# Patient Record
Sex: Female | Born: 1950 | Race: Black or African American | Hispanic: No | Smoking: Never smoker
Health system: Southern US, Community
[De-identification: ages and names within clinical notes are randomized; demographics above are authoritative.]

## PROBLEM LIST (undated history)

## (undated) DIAGNOSIS — Z923 Personal history of irradiation: Secondary | ICD-10-CM

## (undated) DIAGNOSIS — D4959 Neoplasm of unspecified behavior of other genitourinary organ: Secondary | ICD-10-CM

## (undated) DIAGNOSIS — Z8049 Family history of malignant neoplasm of other genital organs: Secondary | ICD-10-CM

## (undated) DIAGNOSIS — N393 Stress incontinence (female) (male): Secondary | ICD-10-CM

## (undated) DIAGNOSIS — Z800 Family history of malignant neoplasm of digestive organs: Secondary | ICD-10-CM

## (undated) DIAGNOSIS — H44009 Unspecified purulent endophthalmitis, unspecified eye: Secondary | ICD-10-CM

## (undated) DIAGNOSIS — G4733 Obstructive sleep apnea (adult) (pediatric): Secondary | ICD-10-CM

## (undated) DIAGNOSIS — M1990 Unspecified osteoarthritis, unspecified site: Secondary | ICD-10-CM

## (undated) DIAGNOSIS — M8580 Other specified disorders of bone density and structure, unspecified site: Secondary | ICD-10-CM

## (undated) DIAGNOSIS — C189 Malignant neoplasm of colon, unspecified: Secondary | ICD-10-CM

## (undated) DIAGNOSIS — Z9221 Personal history of antineoplastic chemotherapy: Secondary | ICD-10-CM

## (undated) DIAGNOSIS — O2230 Deep phlebothrombosis in pregnancy, unspecified trimester: Secondary | ICD-10-CM

## (undated) DIAGNOSIS — H16009 Unspecified corneal ulcer, unspecified eye: Secondary | ICD-10-CM

## (undated) HISTORY — DX: Malignant neoplasm of colon, unspecified: C18.9

## (undated) HISTORY — PX: COLON SURGERY: SHX602

## (undated) HISTORY — DX: Deep phlebothrombosis in pregnancy, unspecified trimester: O22.30

## (undated) HISTORY — DX: Obstructive sleep apnea (adult) (pediatric): G47.33

## (undated) HISTORY — PX: ABDOMINAL HYSTERECTOMY: SHX81

## (undated) HISTORY — DX: Personal history of antineoplastic chemotherapy: Z92.21

## (undated) HISTORY — DX: Other specified disorders of bone density and structure, unspecified site: M85.80

## (undated) HISTORY — DX: Stress incontinence (female) (male): N39.3

## (undated) HISTORY — DX: Family history of malignant neoplasm of other genital organs: Z80.49

## (undated) HISTORY — DX: Morbid (severe) obesity due to excess calories: E66.01

## (undated) HISTORY — DX: Family history of malignant neoplasm of digestive organs: Z80.0

## (undated) HISTORY — DX: Personal history of irradiation: Z92.3

## (undated) HISTORY — DX: Unspecified osteoarthritis, unspecified site: M19.90

---

## 1976-08-02 HISTORY — PX: BREAST CYST EXCISION: SHX579

## 1977-08-02 DIAGNOSIS — O2230 Deep phlebothrombosis in pregnancy, unspecified trimester: Secondary | ICD-10-CM

## 1977-08-02 HISTORY — DX: Deep phlebothrombosis in pregnancy, unspecified trimester: O22.30

## 1980-08-02 HISTORY — PX: KELOID EXCISION: SHX1856

## 2000-08-02 HISTORY — PX: TOTAL HIP ARTHROPLASTY: SHX124

## 2000-08-02 HISTORY — PX: JOINT REPLACEMENT: SHX530

## 2009-08-02 HISTORY — PX: MASS EXCISION: SHX2000

## 2009-11-16 ENCOUNTER — Ambulatory Visit: Payer: Self-pay | Admitting: Diagnostic Radiology

## 2009-11-16 ENCOUNTER — Emergency Department (HOSPITAL_BASED_OUTPATIENT_CLINIC_OR_DEPARTMENT_OTHER): Admission: EM | Admit: 2009-11-16 | Discharge: 2009-11-16 | Payer: Self-pay | Admitting: Emergency Medicine

## 2009-11-18 ENCOUNTER — Emergency Department (HOSPITAL_BASED_OUTPATIENT_CLINIC_OR_DEPARTMENT_OTHER): Admission: EM | Admit: 2009-11-18 | Discharge: 2009-11-18 | Payer: Self-pay | Admitting: Emergency Medicine

## 2009-11-24 ENCOUNTER — Emergency Department (HOSPITAL_BASED_OUTPATIENT_CLINIC_OR_DEPARTMENT_OTHER): Admission: EM | Admit: 2009-11-24 | Discharge: 2009-11-24 | Payer: Self-pay | Admitting: Emergency Medicine

## 2009-11-30 HISTORY — PX: TOTAL ABDOMINAL HYSTERECTOMY W/ BILATERAL SALPINGOOPHORECTOMY: SHX83

## 2009-12-03 ENCOUNTER — Ambulatory Visit: Payer: Self-pay | Admitting: Obstetrics and Gynecology

## 2009-12-03 ENCOUNTER — Other Ambulatory Visit: Admission: RE | Admit: 2009-12-03 | Discharge: 2009-12-03 | Payer: Self-pay | Admitting: Obstetrics and Gynecology

## 2009-12-04 ENCOUNTER — Encounter: Payer: Self-pay | Admitting: Obstetrics and Gynecology

## 2009-12-04 ENCOUNTER — Ambulatory Visit: Admission: RE | Admit: 2009-12-04 | Discharge: 2009-12-04 | Payer: Self-pay | Admitting: Gynecologic Oncology

## 2009-12-04 LAB — CONVERTED CEMR LAB: CA 125: 275.8 units/mL — ABNORMAL HIGH (ref 0.0–30.2)

## 2009-12-09 ENCOUNTER — Inpatient Hospital Stay (HOSPITAL_COMMUNITY): Admission: RE | Admit: 2009-12-09 | Discharge: 2009-12-12 | Payer: Self-pay | Admitting: Obstetrics & Gynecology

## 2009-12-09 ENCOUNTER — Encounter: Payer: Self-pay | Admitting: Obstetrics & Gynecology

## 2009-12-17 ENCOUNTER — Ambulatory Visit: Admission: RE | Admit: 2009-12-17 | Discharge: 2009-12-17 | Payer: Self-pay | Admitting: Gynecologic Oncology

## 2009-12-27 DIAGNOSIS — D4959 Neoplasm of unspecified behavior of other genitourinary organ: Secondary | ICD-10-CM

## 2009-12-27 HISTORY — DX: Neoplasm of unspecified behavior of other genitourinary organ: D49.59

## 2010-01-29 ENCOUNTER — Ambulatory Visit: Admission: RE | Admit: 2010-01-29 | Discharge: 2010-01-29 | Payer: Self-pay | Admitting: Gynecologic Oncology

## 2010-10-20 LAB — DIFFERENTIAL
Basophils Absolute: 0 10*3/uL (ref 0.0–0.1)
Eosinophils Relative: 2 % (ref 0–5)
Lymphs Abs: 2.1 10*3/uL (ref 0.7–4.0)
Monocytes Absolute: 0.5 10*3/uL (ref 0.1–1.0)
Monocytes Relative: 8 % (ref 3–12)
Neutro Abs: 3.5 10*3/uL (ref 1.7–7.7)
Neutrophils Relative %: 56 % (ref 43–77)

## 2010-10-20 LAB — CBC
HCT: 36.1 % (ref 36.0–46.0)
HCT: 44 % (ref 36.0–46.0)
Hemoglobin: 14.3 g/dL (ref 12.0–15.0)
MCHC: 32.4 g/dL (ref 30.0–36.0)
MCV: 91.4 fL (ref 78.0–100.0)
Platelets: 199 10*3/uL (ref 150–400)
Platelets: 233 10*3/uL (ref 150–400)
RBC: 3.97 MIL/uL (ref 3.87–5.11)
RBC: 4.81 MIL/uL (ref 3.87–5.11)
RDW: 14.5 % (ref 11.5–15.5)
RDW: 14.7 % (ref 11.5–15.5)
WBC: 6.3 10*3/uL (ref 4.0–10.5)

## 2010-10-20 LAB — COMPREHENSIVE METABOLIC PANEL
GFR calc Af Amer: >60 mL/min (ref >60)
Potassium: 4.9 mEq/L (ref 3.5–5.1)
Total Protein: 7.3 g/dL (ref 6.0–8.3)

## 2010-10-20 LAB — TYPE AND SCREEN: Antibody Screen: NEGATIVE

## 2010-10-20 LAB — BASIC METABOLIC PANEL
BUN: 7 mg/dL (ref 6–23)
CO2: 29 mEq/L (ref 19–32)
Calcium: 7.9 mg/dL — ABNORMAL LOW (ref 8.4–10.5)
Calcium: 8.1 mg/dL — ABNORMAL LOW (ref 8.4–10.5)
Chloride: 110 mEq/L (ref 96–112)
Creatinine, Ser: 1.03 mg/dL (ref 0.4–1.2)
GFR calc Af Amer: >60 mL/min (ref >60)
GFR calc non Af Amer: 50 mL/min — ABNORMAL LOW (ref >60)
Potassium: 3.9 mEq/L (ref 3.5–5.1)
Potassium: 4.1 mEq/L (ref 3.5–5.1)
Sodium: 139 mEq/L (ref 135–145)
Sodium: 139 mEq/L (ref 135–145)

## 2010-10-20 LAB — ABO/RH: ABO/RH(D): A POS

## 2010-10-20 LAB — URINALYSIS, MICROSCOPIC ONLY
Glucose, UA: NEGATIVE mg/dL
Nitrite: NEGATIVE
pH: 5.5 (ref 5.0–8.0)

## 2010-10-20 LAB — HEMOGLOBIN AND HEMATOCRIT, BLOOD
HCT: 37.4 % (ref 36.0–46.0)
Hemoglobin: 12 g/dL (ref 12.0–15.0)

## 2011-01-28 ENCOUNTER — Ambulatory Visit: Attending: Gynecologic Oncology | Admitting: Gynecologic Oncology

## 2011-01-28 DIAGNOSIS — D4959 Neoplasm of unspecified behavior of other genitourinary organ: Secondary | ICD-10-CM | POA: Insufficient documentation

## 2011-01-29 NOTE — Consult Note (Signed)
NAMEMELISS, FLEEK NO.:  [id number]  MEDICAL RECORD NO.:  [id number]  LOCATION:  GYN                          FACILITY:  [facility]  PHYSICIAN:  Laurette Schimke, MD     DATE OF BIRTH:  [birth year]  DATE OF CONSULTATION:  01/28/2011 DATE OF DISCHARGE:                                CONSULTATION   REASON FOR VISIT:  Surveillance for low malignant potential tumor.  HISTORY OF PRESENT ILLNESS:  This is a 60 year old, last normal menstrual in 1998, who fell off a ladder and sustained a laceration to the right lower quadrant.  Emergency room evaluation included a CT scan of the abdomen which noted the presence of 12.6 x 9.2 x 11.3 cm mass. She underwent a total abdominal hysterectomy, bilateral salpingo- oophorectomy and intragastric omentectomy on Dec 09, 2009.  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  Jasmine Clayton has done well since that procedure.  PAST MEDICAL HISTORY:  Obesity for 40 years, low malignant potential tumor diagnosed in May 2011.  PAST SURGICAL HISTORY:  Abdominoplasty 40 years ago.  Cesarean section 40 years ago.  Left hip replacement in 2002.  Total abdominal hysterectomy, bilateral salpingo-oophorectomy and infragastric omentectomy in May 2011.  FAMILY HISTORY:  No interval changes.  SOCIAL HISTORY:  She has a new grandchild.  There is a lot of stress in her family.  REVIEW OF SYSTEMS:  Ten-point review of systems noteworthy for weight gain.  No nausea, vomiting, abdominal pain.  No fevers or chills.  No bleeding from the rectum, bladder or vagina.  PHYSICAL EXAMINATION:  GENERAL:  Well-developed, obese female; in no acute distress. VITAL SIGNS:  Weight 254 pounds, height 5 feet, blood pressure 140/88, pulse 90, temperature 98.4 ABDOMEN:  Soft, nontender, keloid formation noted in midline vertical skin incision at  the site of laceration in the right lower quadrant, nontender. PELVIC EXAMINATION:  Normal external genitalia, Bartholin's, urethral and Skene's.  Atrophic vagina.  No masses.  No discharge, no bleeding. No nodularity in the cul-de-sac. RECTAL EXAMINATION:  Good anal sphincter without any masses. EXTREMITIES:  A 1 to 2+ lower extremity edema bilaterally.  PROBLEMS: 1. Stage IA low malignant potential tumor.  The importance of followup     was stressed with Jasmine Clayton.  She has been advised to follow up     with Dr. Macon Large in 6 months and GYN Oncology in 12 months. 2. Morbid obesity.  Ms., Jasmine Clayton is well aware of the 22 pounds     weight gain since her last visit.  She understands the importance     of weight loss in her overall health.  I have also impressed upon     her that as woman of her age, general practitioner is necessary for      screening, for diabetes, coronary artery disease etc. She states     that she will follow up and she understands the need for mammography      and colonoscopy. She is given the names of family doctors in her area     and selects to go to [place] [facility].     Laurette Schimke, MD     WB/MEDQ  D:  01/28/2011  T:  01/28/2011  Job:  [id number]  cc:   Horton Chin, MD  Telford Nab, R.N. [address] [place], [place] [address]  [facility]  Electronically Signed by Laurette Schimke MD on 01/29/2011 47:82:95 AM

## 2011-02-10 ENCOUNTER — Ambulatory Visit (INDEPENDENT_AMBULATORY_CARE_PROVIDER_SITE_OTHER): Admitting: Family

## 2011-02-10 ENCOUNTER — Ambulatory Visit (HOSPITAL_BASED_OUTPATIENT_CLINIC_OR_DEPARTMENT_OTHER)
Admission: RE | Admit: 2011-02-10 | Discharge: 2011-02-10 | Disposition: A | Discharge status: Home / Self Care | Source: Ambulatory Visit | Attending: Family | Admitting: Family

## 2011-02-10 ENCOUNTER — Encounter: Payer: Self-pay | Admitting: Family

## 2011-02-10 DIAGNOSIS — R195 Other fecal abnormalities: Secondary | ICD-10-CM

## 2011-02-10 DIAGNOSIS — N838 Other noninflammatory disorders of ovary, fallopian tube and broad ligament: Secondary | ICD-10-CM

## 2011-02-10 DIAGNOSIS — R05 Cough: Secondary | ICD-10-CM

## 2011-02-10 DIAGNOSIS — R030 Elevated blood-pressure reading, without diagnosis of hypertension: Secondary | ICD-10-CM

## 2011-02-10 DIAGNOSIS — N839 Noninflammatory disorder of ovary, fallopian tube and broad ligament, unspecified: Secondary | ICD-10-CM

## 2011-02-10 DIAGNOSIS — I10 Essential (primary) hypertension: Secondary | ICD-10-CM | POA: Insufficient documentation

## 2011-02-10 DIAGNOSIS — K625 Hemorrhage of anus and rectum: Secondary | ICD-10-CM

## 2011-02-10 DIAGNOSIS — R059 Cough, unspecified: Secondary | ICD-10-CM | POA: Insufficient documentation

## 2011-02-10 NOTE — Assessment & Plan Note (Signed)
Pt with cough x 1 month.  + associated gerd symptoms.  Will add empiric PPI and send for CXR to rule out pneumonia.

## 2011-02-10 NOTE — Assessment & Plan Note (Signed)
Patient counseled on diet, exercise and weight loss. 

## 2011-02-10 NOTE — Patient Instructions (Signed)
Please complete your chest x-ray on the first floor today. You will be contacted about your referral to Gastroenterology to evaluate your rectal bleeding. Complete your blood work on the first floor.  Follow up in 1 month for a physical, come fasting to this appointment.

## 2011-02-10 NOTE — Assessment & Plan Note (Signed)
S/p resection- Stage IA low malignant potential tumor. This is being followed by GYN and GYN oncology.

## 2011-02-10 NOTE — Assessment & Plan Note (Signed)
Discussed low sodium diet. Will repeat bp in 1 month.

## 2011-02-10 NOTE — Assessment & Plan Note (Signed)
Check CBC and refer to GI for further evaluation.

## 2011-02-10 NOTE — Progress Notes (Signed)
  Subjective:    Patient ID: Jasmine Clayton, female    DOB: March 08, 1951, 60 y.o.   MRN: [id number]  HPI  1. Cough- symptoms started 1 month ago.  Reports that she has had this intermittently in the past.  Symptoms start as a "tickle" in her throat, then brings up mucous.  Even up in the middle of the night.  Phlegm is clear.  Denies associated fever or nasal drainage.  She reports that she has tried cough drops without improvement. Denies hx of asthma or smoking.  Does report + associated GERD symptoms.   2.  Rectal bleeding-  Reports that occasionally she sees blood in the stools.  First noticed about 6 months ago.  She has never had a colonoscopy.  She denies known history of hemorrhoids.  Denies constipation.    3.  Reports that sh had a history of DVT back in 1979 when she was pregnant- was treated with coumadin.    4.  DJD- had hip replacement 2002- This was completed at [facility]- Dr. Tenny Craw.   5.  Ovarian Mass- reports that she came through the emergency room- following a fall.  She had a CT scan which revealed ovarian cancer. Was removed by Dr. Nelly Rout- completed at [facility].   Review of Systems  Constitutional:       + weight gain, notes that she is not as active as she used to be.  Eats one meal a day only + snacks.    HENT: Negative for hearing loss and congestion.   Eyes: Negative for visual disturbance.  Respiratory: Positive for cough.        + DOE- started recently since she gained 24 pounds  Cardiovascular: Negative for chest pain and palpitations.       "a little" swelling in feet.  Gastrointestinal: Positive for blood in stool. Negative for nausea and diarrhea.       Vomits occasionally with cough  Genitourinary: Negative for hematuria.       + stress incontinence with coughing.  Musculoskeletal: Positive for back pain and arthralgias.  Neurological: Negative for headaches.  Psychiatric/Behavioral:       Denies depression or anxiety.       Objective:   Physical  Exam  Constitutional: She is oriented to person, place, and time. She appears well-developed and well-nourished.       Morbidly obese AA female.  HENT:  Head: Normocephalic and atraumatic.  Eyes: Conjunctivae are normal.  Cardiovascular: Normal rate and regular rhythm.   Pulmonary/Chest: Effort normal and breath sounds normal.  Abdominal: Soft. Bowel sounds are normal.       Rectal exam- normal, no visible external hemorrhoids or palpable internal hemorrhoids.  Heme + stool in rectal vault today.   Neurological: She is alert and oriented to person, place, and time. Coordination normal.  Skin: Skin is warm and dry.  Psychiatric: She has a normal mood and affect. Her behavior is normal. Judgment and thought content normal.          Assessment & Plan:  45 minutes spent with the patient today>50% of this time was spent counseling patient on her morbid obesity, diet, exercise, and importance of medical f/u.

## 2011-02-11 ENCOUNTER — Encounter: Payer: Self-pay | Admitting: Gastroenterology

## 2011-02-11 ENCOUNTER — Telehealth: Payer: Self-pay | Admitting: Family

## 2011-02-11 LAB — CBC WITH DIFFERENTIAL/PLATELET
Basophils Relative: 0 % (ref 0–1)
HCT: 42.1 % (ref 36.0–46.0)
Hemoglobin: 13.1 g/dL (ref 12.0–15.0)
Lymphocytes Relative: 41 % (ref 12–46)
Neutro Abs: 3.3 10*3/uL (ref 1.7–7.7)
Neutrophils Relative %: 48 % (ref 43–77)
Platelets: 278 10*3/uL (ref 150–400)
RBC: 4.72 MIL/uL (ref 3.87–5.11)
RDW: 15.6 % — ABNORMAL HIGH (ref 11.5–15.5)

## 2011-02-11 LAB — BASIC METABOLIC PANEL
BUN: 15 mg/dL (ref 6–23)
Calcium: 9.7 mg/dL (ref 8.4–10.5)
Glucose, Bld: 84 mg/dL (ref 70–99)
Potassium: 4.3 mEq/L (ref 3.5–5.3)

## 2011-02-11 MED ORDER — AZITHROMYCIN 250 MG PO TABS: ORAL_TABLET | ORAL | Status: AC

## 2011-02-11 NOTE — Telephone Encounter (Signed)
CXR shows possible bronchitis.  Will plan to treat with z-pack.

## 2011-02-17 NOTE — Telephone Encounter (Signed)
Called pt, she reports that her cough is intermittent.  Somewhat improved. She started z-pak today.  Instructed pt to complete zpak and contact me if her symptoms do not improve.

## 2011-03-15 ENCOUNTER — Encounter: Payer: Self-pay | Admitting: Family

## 2011-03-15 ENCOUNTER — Ambulatory Visit (INDEPENDENT_AMBULATORY_CARE_PROVIDER_SITE_OTHER): Admitting: Family

## 2011-03-15 DIAGNOSIS — Z23 Encounter for immunization: Secondary | ICD-10-CM

## 2011-03-15 DIAGNOSIS — Z0000 Encounter for general adult medical examination without abnormal findings: Secondary | ICD-10-CM

## 2011-03-15 LAB — BASIC METABOLIC PANEL WITH GFR
BUN: 16 mg/dL (ref 6–23)
Creat: 0.98 mg/dL (ref 0.50–1.10)
GFR, Est Non African American: 58 mL/min — ABNORMAL LOW (ref >60)
Glucose, Bld: 91 mg/dL (ref 70–99)
Sodium: 141 mEq/L (ref 135–145)

## 2011-03-15 LAB — CBC WITH DIFFERENTIAL/PLATELET
Basophils Absolute: 0 10*3/uL (ref 0.0–0.1)
Eosinophils Absolute: 0.2 10*3/uL (ref 0.0–0.7)
HCT: 40 % (ref 36.0–46.0)
Hemoglobin: 13 g/dL (ref 12.0–15.0)
MCH: 28 pg (ref 26.0–34.0)
MCV: 86 fL (ref 78.0–100.0)
Monocytes Relative: 11 % (ref 3–12)
Platelets: 254 10*3/uL (ref 150–400)
WBC: 6.2 10*3/uL (ref 4.0–10.5)

## 2011-03-15 LAB — HEPATIC FUNCTION PANEL
AST: 20 U/L (ref 0–37)
Albumin: 4.2 g/dL (ref 3.5–5.2)
Alkaline Phosphatase: 101 U/L (ref 39–117)
Total Protein: 6.7 g/dL (ref 6.0–8.3)

## 2011-03-15 LAB — LIPID PANEL
HDL: 50 mg/dL (ref >39)
Triglycerides: 82 mg/dL (ref <150)

## 2011-03-15 MED ORDER — TETANUS-DIPHTH-ACELL PERTUSSIS 5-2.5-18.5 LF-MCG/0.5 IM SUSP: 0.5000 mL | Freq: Once | INTRAMUSCULAR | Status: DC

## 2011-03-15 NOTE — Assessment & Plan Note (Addendum)
Pt counseled on diet, exercise, weight loss.  Due for colo.  She has consultation with GI scheduled next week due to hx or rectal bleeding.   EKG today.  Fasting lab work, mammogram ordered today.

## 2011-03-15 NOTE — Progress Notes (Signed)
Subjective:    Patient ID: Jasmine Clayton, female    DOB: 1951/07/10, 60 y.o.   MRN: [id number]  HPI  Preventative care-  Not exercising.  Diet- coffee in the AM.  Cooks on Sundays- fast food for dinner.   Occasional snacking.  GYN oncology- She is followed by [facility].  Last mammogram- 6 years ago.  Last tetanus sot >10 yrs ago.      Review of Systems  Constitutional: Negative for fever and unexpected weight change.  HENT: Negative for hearing loss.   Eyes: Negative for visual disturbance.  Respiratory: Negative for shortness of breath.   Cardiovascular: Negative for chest pain.  Gastrointestinal: Positive for blood in stool. Negative for nausea, vomiting, abdominal pain, diarrhea and constipation.  Genitourinary: Negative for dysuria, urgency, frequency and vaginal bleeding.  Musculoskeletal: Negative for myalgias.       Some right knee pain.    Skin: Negative for rash.  Neurological: Negative for weakness, numbness and headaches.  Hematological: Negative for adenopathy.  Psychiatric/Behavioral:       Denies depression or anxiety.     Past Medical History  Diagnosis Date  . DVT (deep vein thrombosis) in pregnancy 1979    during pregnancy. lower extremity  . Stress incontinence, female   . Morbid obesity   . Degenerative joint disease   . Ovarian mass     Stage IA low malignant potential tumor.      History   Social History  . Marital Status: Married    Spouse Name: N/A    Number of Children: 1  . Years of Education: N/A   Occupational History  . 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 Smoking status: Never Smoker   . Smokeless tobacco: Never Used  . Alcohol Use: 7.2 oz/week    12 Cans of beer per week  . Drug Use: No  . Sexually Active: Not on file   Other Topics Concern  . Not on file   Social History Narrative   Regular exercise:  NoCaffeine Use:  1 cup coffee dailyMarried- lives with husband.  Daughter,  son-in-law and her 2 children.  Completed the 12th grade, and some college.    Past Surgical History  Procedure Date  . Abdominal hysterectomy 11/30/09  . Joint replacement 2002    left hip  . Mass excision 2011    "mass removed from ovaries"    Family History  Problem Relation Age of Onset  . Cancer Mother     endometrial cancer  . Diabetes Father     No Known Allergies  Current Outpatient Prescriptions on File Prior to Visit  Medication Sig Dispense Refill  . aspirin 81 MG tablet Take 81 mg by mouth daily.        . B Complex Vitamins (B-COMPLEX/B-12 PO) Take 1 tablet by mouth daily.        . fish oil-omega-3 fatty acids 1000 MG capsule Take 1 g by mouth daily.        Marland Kitchen omeprazole (PRILOSEC OTC) 20 MG tablet 20 mg.          BP 128/82  Pulse 80  Temp(Src) 98.2 F (36.8 C) (Oral)  Ht 5' (1.524 m)  Wt 256 lb 1.9 oz (116.175 kg)  BMI 50.02 kg/m2  LMP 11/30/2009        Objective:   Physical Exam  Constitutional: She is oriented to person, place, and time. She appears well-developed  and well-nourished.  HENT:  Head: Normocephalic and atraumatic.  Right Ear: Tympanic membrane and ear canal normal.  Left Ear: Tympanic membrane and ear canal normal.  Nose: Nose normal.  Eyes:       Difficult to assess pupils due to colored contacts.    Cardiovascular: Normal rate and regular rhythm.   Pulmonary/Chest: Effort normal and breath sounds normal. No respiratory distress. She has no wheezes. She has no rales. She exhibits no tenderness.  Abdominal: Soft. Bowel sounds are normal. She exhibits no distension and no mass. There is no tenderness. There is no rebound and no guarding.  Genitourinary:       Bilateral breast exam is normal without masses, puckering, dimpling.    GYN exam was completed by GYN per pt.  Deferred.  Musculoskeletal: She exhibits no edema and no tenderness.  Neurological: She is alert and oriented to person, place, and time. No cranial nerve deficit. She  exhibits normal muscle tone. Coordination normal.  Skin: Skin is warm and dry. No rash noted.       Keloid scarring noted on abdomen  Psychiatric: She has a normal mood and affect. Her behavior is normal. Judgment and thought content normal.          Assessment & Plan:

## 2011-03-15 NOTE — Patient Instructions (Signed)
Please complete your lab work on the first floor and schedule your mammogram on the first floor. Keep your appointment with GI. Follow up in 6 months.

## 2011-03-16 ENCOUNTER — Encounter: Payer: Self-pay | Admitting: Family

## 2011-03-16 ENCOUNTER — Ambulatory Visit (HOSPITAL_BASED_OUTPATIENT_CLINIC_OR_DEPARTMENT_OTHER)
Admission: RE | Admit: 2011-03-16 | Discharge: 2011-03-16 | Disposition: A | Discharge status: Home / Self Care | Source: Ambulatory Visit | Attending: Family | Admitting: Family

## 2011-03-16 DIAGNOSIS — Z1231 Encounter for screening mammogram for malignant neoplasm of breast: Secondary | ICD-10-CM | POA: Insufficient documentation

## 2011-03-22 ENCOUNTER — Ambulatory Visit (INDEPENDENT_AMBULATORY_CARE_PROVIDER_SITE_OTHER): Admitting: Gastroenterology

## 2011-03-22 ENCOUNTER — Encounter: Payer: Self-pay | Admitting: Gastroenterology

## 2011-03-22 VITALS — BP 140/84 | HR 80 | Ht 60.0 in | Wt 257.8 lb

## 2011-03-22 DIAGNOSIS — K921 Melena: Secondary | ICD-10-CM

## 2011-03-22 DIAGNOSIS — R195 Other fecal abnormalities: Secondary | ICD-10-CM

## 2011-03-22 NOTE — Progress Notes (Addendum)
History of Present Illness: This is a 60 year old female who relates frequent, small volume hematochezia for approximately 6 months. She states usually has 1 to 3 bowel movements in the morning and generally noticed small amount of bright red blood with one of the bowel movements. She was found to have Hemoccult-positive stool on exam by Melissa O. Lendell Caprice, NP. Patient denies constipation, diarrhea, change in bowel habits, change in stool caliber, abdominal pain, rectal pain, weight loss.   Past Medical History  Diagnosis Date  . DVT (deep vein thrombosis) in pregnancy 1979    during pregnancy. lower extremity  . Stress incontinence, female   . Morbid obesity   . Degenerative joint disease   . Ovarian mass     Stage IA low malignant potential tumor.     Past Surgical History  Procedure Date  . Total abdominal hysterectomy w/ bilateral salpingoophorectomy 11/30/09  . Total hip arthroplasty 2002    left hip  . Mass excision 2011    "mass removed from ovaries"  . Cesarean section   . Breast cyst excision   . Keloid excision 1982    reports that she has never smoked. She has never used smokeless tobacco. She reports that she drinks about 7.2 ounces of alcohol per week. She reports that she does not use illicit drugs. family history includes Cancer in her mother and Diabetes in her father.  There is no history of Colon cancer. No Known Allergies    Outpatient Encounter Prescriptions as of 03/22/2011  Medication Sig Dispense Refill  . aspirin 81 MG tablet Take 81 mg by mouth daily.        . B Complex Vitamins (B-COMPLEX/B-12 PO) Take 1 tablet by mouth daily.        . calcium-vitamin D (OSCAL) 250-125 MG-UNIT per tablet Take 1 tablet by mouth as needed.        . fish oil-omega-3 fatty acids 1000 MG capsule Take 1 g by mouth daily.        Marland Kitchen omeprazole (PRILOSEC OTC) 20 MG tablet Take 20 mg by mouth daily.        Facility-Administered Encounter Medications as of 03/22/2011  Medication Dose  Route Frequency Provider Last Rate Last Dose  . TDaP (BOOSTRIX) injection 0.5 mL  0.5 mL Intramuscular Once Melissa S. Peggyann Juba, NP       Review of Systems: Pertinent positive and negative review of systems were noted in the above HPI section. All other review of systems were otherwise negative.  Physical Exam: General: Well developed , well nourished, obese, no acute distress Head: Normocephalic and atraumatic Eyes:  sclerae anicteric, EOMI Ears: Normal auditory acuity Mouth: No deformity or lesions Neck: Supple, no masses or thyromegaly Lungs: Clear throughout to auscultation Heart: Regular rate and rhythm; no murmurs, rubs or bruits Abdomen: Soft, non tender and non distended. No masses, hepatosplenomegaly or hernias noted. Normal Bowel sounds Rectal: Recent exam unremarkable except for Hemoccult-positive stool. Defer exam to colonoscopy Musculoskeletal: Symmetrical with no gross deformities  Skin: No lesions on visible extremities Pulses:  Normal pulses noted Extremities: No clubbing, cyanosis, edema or deformities noted Neurological: Alert oriented x 4, grossly nonfocal Cervical Nodes:  No significant cervical adenopathy Inguinal Nodes: No significant inguinal adenopathy Psychological:  Alert and cooperative. Normal mood and affect  Assessment and Recommendations:  1. Small volume hematochezia and Hemoccult-positive stool. Likely a benign anorectal source such as hemorrhoids however colorectal neoplasms and proctitis need to be excluded. Schedule colonoscopy. The risks, benefits, and alternatives  to colonoscopy with possible biopsy and possible polypectomy were discussed with the patient and they consent to proceed.

## 2011-03-22 NOTE — Patient Instructions (Addendum)
You have been scheduled for a Colonoscopy. See Separate instructions.  Suprep kit sample given. cc: [name], NP

## 2011-04-09 ENCOUNTER — Encounter: Payer: Self-pay | Admitting: Gastroenterology

## 2011-04-09 ENCOUNTER — Ambulatory Visit (AMBULATORY_SURGERY_CENTER): Admitting: Gastroenterology

## 2011-04-09 DIAGNOSIS — K921 Melena: Secondary | ICD-10-CM

## 2011-04-09 DIAGNOSIS — C20 Malignant neoplasm of rectum: Secondary | ICD-10-CM

## 2011-04-09 DIAGNOSIS — R198 Other specified symptoms and signs involving the digestive system and abdomen: Secondary | ICD-10-CM

## 2011-04-09 DIAGNOSIS — R195 Other fecal abnormalities: Secondary | ICD-10-CM

## 2011-04-09 DIAGNOSIS — K6289 Other specified diseases of anus and rectum: Secondary | ICD-10-CM

## 2011-04-09 MED ORDER — SODIUM CHLORIDE 0.9 % IV SOLN: 500.0000 mL | INTRAVENOUS | Status: DC

## 2011-04-09 NOTE — Patient Instructions (Signed)
HOLD ASPIRIN PRODUCTS AND ANTIINFLAMMATORY MEDICATIONS FOR 2 WEEKS.  AWAIT PATHOLOGY RESULTS.  OFFICE VISIT IN 1-2 WEEKS.  DR Ardell Isaacs OFFICE WILL CALL YOU TO SCHEDULE.

## 2011-04-12 ENCOUNTER — Encounter: Payer: Self-pay | Admitting: Family

## 2011-04-12 ENCOUNTER — Telehealth: Payer: Self-pay

## 2011-04-12 DIAGNOSIS — C189 Malignant neoplasm of colon, unspecified: Secondary | ICD-10-CM

## 2011-04-12 HISTORY — DX: Malignant neoplasm of colon, unspecified: C18.9

## 2011-04-12 NOTE — Telephone Encounter (Signed)
CT scan scheduled for 04/14/11 2:00 Appt with Dr Dwain Sarna 04/26/11 9:20 Patient will come by and pick up contrast and instructions Recall entered

## 2011-04-12 NOTE — Telephone Encounter (Signed)

## 2011-04-12 NOTE — Telephone Encounter (Signed)
Message copied by Annett Fabian on Mon Apr 12, 2011  2:31 PM ------      Message from: Claudette Head T      Created: Mon Apr 12, 2011  2:01 PM       I called the patient with pathology report at 1400. She understands that she has colon cancer and needs surgery.      Schedule abd/pelvic CT      Schedule surgical consultation      Colon recall in 04/2012      No need for path letter

## 2011-04-14 ENCOUNTER — Ambulatory Visit (INDEPENDENT_AMBULATORY_CARE_PROVIDER_SITE_OTHER)
Admission: RE | Admit: 2011-04-14 | Discharge: 2011-04-14 | Disposition: A | Discharge status: Home / Self Care | Source: Ambulatory Visit | Attending: Gastroenterology | Admitting: Gastroenterology

## 2011-04-14 DIAGNOSIS — C189 Malignant neoplasm of colon, unspecified: Secondary | ICD-10-CM

## 2011-04-14 MED ORDER — IOHEXOL 300 MG/ML  SOLN
100.0000 mL | Freq: Once | INTRAMUSCULAR | Status: AC | PRN
  Administered 2011-04-14: 100 mL via INTRAVENOUS

## 2011-04-22 ENCOUNTER — Telehealth: Payer: Self-pay | Admitting: Gastroenterology

## 2011-04-22 ENCOUNTER — Ambulatory Visit: Admitting: Gastroenterology

## 2011-04-22 NOTE — Telephone Encounter (Signed)
Patient wanted to know if she needed to go to the appt with Dr Dwain Sarna on Monday fasting and if she had to go a colon prep.  She is advised this is just a consult and that no surgery will be done on Monday.  All questions answered. She is asked to call back for any further questions or concerns.

## 2011-04-26 ENCOUNTER — Encounter (INDEPENDENT_AMBULATORY_CARE_PROVIDER_SITE_OTHER): Payer: Self-pay | Admitting: General Surgery

## 2011-04-26 ENCOUNTER — Ambulatory Visit (INDEPENDENT_AMBULATORY_CARE_PROVIDER_SITE_OTHER): Admitting: General Surgery

## 2011-04-26 VITALS — BP 160/98 | HR 80 | Temp 97.1°F | Resp 16 | Ht 60.0 in | Wt 258.2 lb

## 2011-04-26 DIAGNOSIS — C189 Malignant neoplasm of colon, unspecified: Secondary | ICD-10-CM

## 2011-04-26 NOTE — Progress Notes (Signed)
Chief Complaint  Patient presents with  . Other    eval of colon cancer     HPI Jasmine Clayton is a 60 y.o. female.   HPI This is a 60 year old female who presents with a newly diagnosed colorectal cancer. She had never undergone a colonoscopy and her sister recently was diagnosed with colon cancer. She did report that she had some blood noted in around her stool. She has no other changes in her bowel movements or any complaints referable to her gastrointestinal tract. She underwent a colonoscopy which showed a mass found in the proximal rectum that was friable soft and wide based. This was a 4 cm in size. Otherwise her colonoscopy was normal. 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 She comes in today discuss her options after this diagnosis.  Past Medical History  Diagnosis Date  . DVT (deep vein thrombosis) in pregnancy 1979    during pregnancy. lower extremity  . Stress incontinence, female   . Morbid obesity   . Degenerative joint disease   . Adenocarcinoma of colon 04/12/2011    Past Surgical History  Procedure Date  . Total abdominal hysterectomy w/ bilateral salpingoophorectomy 11/30/09  . Total hip arthroplasty 2002    left hip  . Mass excision 2011    "mass removed from ovaries"  . Cesarean section   . Breast cyst excision   . Keloid excision 1982  . Joint replacement 2002    hip replacement    Family History  Problem Relation Age of Onset  . Cancer Mother     endometrial cancer  . Diabetes Father   . Colon cancer Neg Hx     Social History History  Substance Use Topics  . Smoking status: Never Smoker   . Smokeless tobacco: Never Used  . Alcohol Use: 3.5 oz/week    7 drink(s) per week    No Known Allergies  Current Outpatient Prescriptions  Medication Sig Dispense Refill  . aspirin 81 MG tablet Take 81 mg by mouth daily.        . B Complex Vitamins  (B-COMPLEX/B-12 PO) Take 1 tablet by mouth daily.        . calcium-vitamin D (OSCAL) 250-125 MG-UNIT per tablet Take 1 tablet by mouth as needed.        . fish oil-omega-3 fatty acids 1000 MG capsule Take 1 g by mouth daily.        Marland Kitchen omeprazole (PRILOSEC OTC) 20 MG tablet Take 20 mg by mouth daily.         Review of Systems Review of Systems  Respiratory: Positive for cough.   Gastrointestinal: Positive for blood in stool.  All other systems reviewed and are negative.    Blood pressure 160/98, pulse 80, temperature 97.1 F (36.2 C), resp. rate 16, height 5' (1.524 m), weight 258 lb 4 oz (117.141 kg), last menstrual period 11/30/2009.  Physical Exam Physical Exam  Constitutional: She appears well-developed and well-nourished. No distress.  Eyes: No scleral icterus.  Neck: Neck supple. No thyromegaly present.  Cardiovascular: Normal rate, regular rhythm, normal heart sounds and intact distal pulses.   Pulmonary/Chest: Effort normal and breath sounds normal. She has no wheezes. She has no rales.  Abdominal: Soft. Bowel sounds are normal. She exhibits no mass. There is no hepatosplenomegaly. There is no tenderness. There is no CVA tenderness. No hernia.    Musculoskeletal: She exhibits edema (bilateral pedal).  Lymphadenopathy:    She has no cervical adenopathy.    Data Reviewed CT ABDOMEN AND PELVIS WITH CONTRAST  Technique: Multidetector CT imaging of the abdomen and pelvis was  performed following the standard protocol during bolus  administration of intravenous contrast.  Contrast: OMNIPAQUE IOHEXOL 300 MG/ML IV SOLN  Comparison: 11/17/2010  Findings: Mild bibasilar atelectasis / scarring.  Two stable left hepatic lobe cysts (series 2/images 14 and 22). No  suspicious hepatic lesions.  Spleen, pancreas, and adrenal glands within normal limits.  Gallbladder is contracted. No intrahepatic or extrahepatic ductal  dilatation.  Two tiny left anterior renal cysts (series  5/image 17). Right  kidney is unremarkable. No hydronephrosis.  No evidence of bowel obstruction. No colonic wall thickening or  inflammatory changes. The patient's recent newly diagnosed colon  cancer in the region of the rectum is not definitely visualized on  CT.  No evidence of abdominal aortic aneurysm.  No abdominopelvic ascites.  No suspicious abdominopelvic lymphadenopathy.  Postsurgical changes in the midline lower anterior abdominal wall.  Status post hysterectomy and bilateral oophorectomy. Previously  visualized pelvic mass is surgically absent.  Stable appearance of the left hip arthroplasty. Degenerative  changes of the visualized thoracolumbar spine. No focal osseous  lesions.  IMPRESSION:  Known colon cancer in the region of the rectum is not definitely  visualized on CT.  No evidence of metastatic disease in the abdomen/pelvis.  Interval resection of previously visualized pelvic mass.   Assessment    Colorectal cancer    Plan       I discussed with her daughter her new diagnosis of a colorectal cancer. On the scope report as well as in communication with Dr. Russella Dar this appears to be about 10-12 cm from her anal verge making this a rectal cancer. I'm going to plan on doing a rigid proctoscopy in the office after having a prep next week to confirm the location. This is really show up on her CT scan it is very important for her therapy to ensure we do the exact location of this lesion.  I discussed in general today the staging and pathophysiology of colorectal cancer. We discussed that most likely her first treatment is going to be operative. We discussed that she is certainly at high risk due to her body habitus as well as the prior low midline incision she has from her hysterectomy. I discussed a laparoscopic possible open colectomy with her and her daughter today. We discussed the risks of not being but not limited to bleeding, blood transfusion, 10-15% chance of some  sort of infection postoperatively, DVT, PE, pneumonia, cardiac arrhythmia, myocardial infarction, stroke, anastomotic leak requiring reoperation and possible ostomy, intra-abdominal abscess requiring care. We also discussed there might be a chance that she would need a diverting loop ileostomy depending on what his initial surgery she would need in the location of her primary tumor. We also discussed that there is a possibility that this might be managed best with preoperative systemic therapy and radiation therapy depending on where the location is on a rigid proctoscopy.It appears to be in the high rectum and we can proceed to surgery but will await scope.  I also discussed possible temporary diverting ileostomy at time of initial surgery  I discussed with her family is certainly high risk of having colon cancer her daughter will need to have colonoscopies. She does report that her sister has a colorectal cancer for which she's recently been operated on as an ostomy currently.  Genetic testing may be reasonable in future as well.    Plan at the time of operation of using entereg as well as subcutaneous heparin.    Isabellah Sobocinski 04/26/2011, 1:05 PM

## 2011-04-28 ENCOUNTER — Telehealth: Payer: Self-pay

## 2011-04-28 ENCOUNTER — Telehealth (INDEPENDENT_AMBULATORY_CARE_PROVIDER_SITE_OTHER): Payer: Self-pay

## 2011-04-28 DIAGNOSIS — C20 Malignant neoplasm of rectum: Secondary | ICD-10-CM

## 2011-04-28 NOTE — Telephone Encounter (Signed)
Left message on machine to call back  

## 2011-04-28 NOTE — Telephone Encounter (Signed)
Message copied by Donata Duff on Wed Apr 28, 2011  9:35 AM ------      Message from: Rob Bunting P      Created: Wed Apr 28, 2011  9:31 AM      Regarding: needs EUS this friday, lunch                   She needs lower EUS, 45 min, radial, for newly diagnosed rectal adenocarcinoma.  Referred by Dr. Harden Mo.  Would like to do it this Friday, 12:15??

## 2011-04-28 NOTE — Telephone Encounter (Signed)
Called pt to notify her that we were canceling her appt with Dr Dwain Sarna on 05-03-11 b/c Dr Dwain Sarna referred her to Dr Wendall Papa for the EUS. The pt said that the EUS in set up for 04-30-11 at [facility]. / [facility]

## 2011-04-28 NOTE — Telephone Encounter (Signed)
Pt has been instructed and meds reviewed 

## 2011-04-30 ENCOUNTER — Ambulatory Visit (HOSPITAL_COMMUNITY)
Admission: RE | Admit: 2011-04-30 | Discharge: 2011-04-30 | Disposition: A | Discharge status: Home / Self Care | Source: Ambulatory Visit | Attending: Gastroenterology | Admitting: Gastroenterology

## 2011-04-30 ENCOUNTER — Encounter: Admitting: Gastroenterology

## 2011-04-30 DIAGNOSIS — C20 Malignant neoplasm of rectum: Secondary | ICD-10-CM | POA: Insufficient documentation

## 2011-04-30 DIAGNOSIS — K625 Hemorrhage of anus and rectum: Secondary | ICD-10-CM | POA: Insufficient documentation

## 2011-04-30 DIAGNOSIS — C210 Malignant neoplasm of anus, unspecified: Secondary | ICD-10-CM

## 2011-05-03 ENCOUNTER — Encounter (INDEPENDENT_AMBULATORY_CARE_PROVIDER_SITE_OTHER): Admitting: General Surgery

## 2011-05-03 ENCOUNTER — Telehealth (INDEPENDENT_AMBULATORY_CARE_PROVIDER_SITE_OTHER): Payer: Self-pay

## 2011-05-03 NOTE — Telephone Encounter (Signed)
Called pt to notify her that Dr Dwain Sarna wants her to come to the office on Tue. To discuss surgery. Appt made for 8:30 with Dr Dwain Sarna.[facility]

## 2011-05-04 ENCOUNTER — Ambulatory Visit (INDEPENDENT_AMBULATORY_CARE_PROVIDER_SITE_OTHER): Admitting: General Surgery

## 2011-05-04 ENCOUNTER — Encounter (INDEPENDENT_AMBULATORY_CARE_PROVIDER_SITE_OTHER): Payer: Self-pay | Admitting: General Surgery

## 2011-05-04 DIAGNOSIS — C189 Malignant neoplasm of colon, unspecified: Secondary | ICD-10-CM

## 2011-05-04 NOTE — Progress Notes (Signed)
Subjective:     Patient ID: Jasmine Clayton, female   DOB: November 25, 1950, 60 y.o.   MRN: [id number]  HPI This is a 60 year old female who presents with a newly diagnosed colorectal cancer. She had never undergone a colonoscopy and her sister recently was diagnosed with colon cancer. She did report that she had some blood noted in around her stool. She has no other changes in her bowel movements or any complaints referable to her gastrointestinal tract. She underwent a colonoscopy which showed a mass found in the proximal rectum that was friable soft and wide based. This was a 4 cm in size. Otherwise her colonoscopy was normal. 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 She has now undergone an EUS with Dr. Wendall Papa that shows a soft non-circumferential mobile mass in proximal to mid-rectum 8 cm from anal verge.  This was inked at time of EUS.  This appears to be a uT1N0 mass.  She reports no new complaints.   Review of Systems     Objective:   Physical Exam Rectal exam shows at very tip of my finger what appears to be anteriorly based polypoid lesion that is mobile    Assessment:     Rectal cancer, uT1N0    Plan:        She does have a new diagnosis of rectal cancer with that on EUS appears to be a uT1N0 tumor. This appears to be about 8 cm above her anal verge. We discussed surgery is her first treatment. She is been discussed at multidisciplinary gastrointestinal cancer conference as well. I had her daughter come back in today to discuss a laparoscopic-assisted low anterior resection. I discussed likely placing a diverting ileostomy at the same time of her surgery due to the fact that his low her multiple medical problems and her body habitus. I told her that I might not do this at the time of surgery if everything went very well I was not is concerned about having a week. I am going to have her  marked prior to surgery ago.  I specifically showed them on a diagram which portion of the colon will be removed and how the operation will be performed. She has had recent pelvic surgery in the last year I am going to have urology placed ureteral stents prior to beginning to hopefully avoid a ureteral injury. We discussed the other risks including but not limited to bleeding, blood transfusion, infection, anastomotic leak requiring reoperation, abscess requiring CT drainage, pneumonia, DVT, PE, myocardial arrhythmia, myocardial infarction, and reoperation. I discussed all of her further treatment decisions will be based on the pathology from this operation. We discussed the takedown of an ileostomy to be at least a couple of months in the future if she is not going to receive any other treatment and if her anastomosis is doing well at that point also. We discussed the usage of subcutaneous heparin, sequential compression devices and entereg. She is also undergoing going to undergo a prep beforehand as well.

## 2011-05-11 ENCOUNTER — Telehealth (INDEPENDENT_AMBULATORY_CARE_PROVIDER_SITE_OTHER): Payer: Self-pay

## 2011-05-11 NOTE — Telephone Encounter (Signed)
Called pt to notify her that I emailed Larkin Ina the ostomy nurse at [facility] about the pt having a preop appt on 05-14-11 and she needs to get marked before surgery./ [facility]

## 2011-05-14 ENCOUNTER — Encounter (HOSPITAL_COMMUNITY)

## 2011-05-14 ENCOUNTER — Other Ambulatory Visit (INDEPENDENT_AMBULATORY_CARE_PROVIDER_SITE_OTHER): Payer: Self-pay | Admitting: General Surgery

## 2011-05-14 LAB — COMPREHENSIVE METABOLIC PANEL
AST: 17 U/L (ref 0–37)
BUN: 12 mg/dL (ref 6–23)
CO2: 28 mEq/L (ref 19–32)
Chloride: 104 mEq/L (ref 96–112)
Creatinine, Ser: 0.76 mg/dL (ref 0.50–1.10)
GFR calc non Af Amer: >90 mL/min (ref >90)
Glucose, Bld: 87 mg/dL (ref 70–99)
Total Bilirubin: 0.3 mg/dL (ref 0.3–1.2)

## 2011-05-14 LAB — DIFFERENTIAL
Basophils Absolute: 0 10*3/uL (ref 0.0–0.1)
Lymphocytes Relative: 29 % (ref 12–46)
Lymphs Abs: 1.9 10*3/uL (ref 0.7–4.0)
Monocytes Absolute: 0.5 10*3/uL (ref 0.1–1.0)
Monocytes Relative: 8 % (ref 3–12)
Neutro Abs: 4.2 10*3/uL (ref 1.7–7.7)

## 2011-05-14 LAB — CBC
HCT: 38.3 % (ref 36.0–46.0)
Hemoglobin: 12.3 g/dL (ref 12.0–15.0)
RBC: 4.45 MIL/uL (ref 3.87–5.11)
WBC: 6.8 10*3/uL (ref 4.0–10.5)

## 2011-05-14 LAB — SURGICAL PCR SCREEN: Staphylococcus aureus: NEGATIVE

## 2011-05-19 ENCOUNTER — Inpatient Hospital Stay (HOSPITAL_COMMUNITY)
Admission: RE | Admit: 2011-05-19 | Discharge: 2011-06-11 | DRG: 330 | Disposition: A | Discharge status: Home Health | Source: Ambulatory Visit | Attending: General Surgery | Admitting: General Surgery

## 2011-05-19 ENCOUNTER — Other Ambulatory Visit (INDEPENDENT_AMBULATORY_CARE_PROVIDER_SITE_OTHER): Payer: Self-pay | Admitting: General Surgery

## 2011-05-19 DIAGNOSIS — R0789 Other chest pain: Secondary | ICD-10-CM | POA: Diagnosis not present

## 2011-05-19 DIAGNOSIS — Z432 Encounter for attention to ileostomy: Secondary | ICD-10-CM

## 2011-05-19 DIAGNOSIS — C189 Malignant neoplasm of colon, unspecified: Secondary | ICD-10-CM | POA: Diagnosis present

## 2011-05-19 DIAGNOSIS — M1990 Unspecified osteoarthritis, unspecified site: Secondary | ICD-10-CM | POA: Diagnosis present

## 2011-05-19 DIAGNOSIS — C20 Malignant neoplasm of rectum: Secondary | ICD-10-CM

## 2011-05-19 DIAGNOSIS — Z90710 Acquired absence of both cervix and uterus: Secondary | ICD-10-CM

## 2011-05-19 DIAGNOSIS — Z6841 Body Mass Index (BMI) 40.0 and over, adult: Secondary | ICD-10-CM

## 2011-05-19 DIAGNOSIS — Y838 Other surgical procedures as the cause of abnormal reaction of the patient, or of later complication, without mention of misadventure at the time of the procedure: Secondary | ICD-10-CM | POA: Diagnosis not present

## 2011-05-19 DIAGNOSIS — K929 Disease of digestive system, unspecified: Secondary | ICD-10-CM | POA: Diagnosis not present

## 2011-05-19 DIAGNOSIS — K560 Paralytic ileus: Secondary | ICD-10-CM | POA: Diagnosis not present

## 2011-05-19 DIAGNOSIS — Z96649 Presence of unspecified artificial hip joint: Secondary | ICD-10-CM

## 2011-05-19 DIAGNOSIS — R000 Tachycardia, unspecified: Secondary | ICD-10-CM | POA: Diagnosis not present

## 2011-05-19 DIAGNOSIS — D62 Acute posthemorrhagic anemia: Secondary | ICD-10-CM | POA: Diagnosis not present

## 2011-05-19 DIAGNOSIS — E46 Unspecified protein-calorie malnutrition: Secondary | ICD-10-CM | POA: Diagnosis not present

## 2011-05-19 DIAGNOSIS — E8809 Other disorders of plasma-protein metabolism, not elsewhere classified: Secondary | ICD-10-CM | POA: Diagnosis present

## 2011-05-19 DIAGNOSIS — Z86718 Personal history of other venous thrombosis and embolism: Secondary | ICD-10-CM

## 2011-05-19 DIAGNOSIS — Z5331 Laparoscopic surgical procedure converted to open procedure: Secondary | ICD-10-CM

## 2011-05-19 DIAGNOSIS — K660 Peritoneal adhesions (postprocedural) (postinfection): Secondary | ICD-10-CM | POA: Diagnosis present

## 2011-05-19 HISTORY — DX: Neoplasm of unspecified behavior of other genitourinary organ: D49.59

## 2011-05-19 LAB — TYPE AND SCREEN

## 2011-05-20 LAB — CBC
Hemoglobin: 11.1 g/dL — ABNORMAL LOW (ref 12.0–15.0)
MCH: 28 pg (ref 26.0–34.0)
RBC: 3.97 MIL/uL (ref 3.87–5.11)

## 2011-05-20 LAB — BASIC METABOLIC PANEL
CO2: 26 mEq/L (ref 19–32)
Calcium: 8.5 mg/dL (ref 8.4–10.5)
Glucose, Bld: 126 mg/dL — ABNORMAL HIGH (ref 70–99)
Potassium: 4.6 mEq/L (ref 3.5–5.1)
Sodium: 137 mEq/L (ref 135–145)

## 2011-05-20 NOTE — Op Note (Signed)
NAMEOLIA, HINDERLITER NO.:  [id number]  MEDICAL RECORD NO.:  [id number]  LOCATION:  1236                         FACILITY:  [facility]  PHYSICIAN:  Juanetta Gosling, MDDATE OF BIRTH:  01-Feb-1951  DATE OF PROCEDURE:  05/19/2011 DATE OF DISCHARGE:                              OPERATIVE REPORT   PREOPERATIVE DIAGNOSIS:  Stage I rectal cancer.  POSTOPERATIVE DIAGNOSIS:  Stage I rectal cancer.  PROCEDURES: 1. Diagnostic laparoscopy with lysis of adhesions. 2. Open low-anterior resection with primary 29-mm stapled anastomosis     at about 5 cm. 3. Diverting ileostomy.  SURGEON:  Troy Sine. Dwain Sarna, MD  ASSISTANT: 1. Anselm Pancoast. Zachery Dakins, MD 2. Thomas A. Cornett, MD  ANESTHESIA:  General.  SPECIMENS:  Rectum, and proximal and distal donuts.  DISPOSITION OF SPECIMEN:  To Pathology.  DRAINS:  Two 19-French Blake drains around the anastomosis.  ESTIMATED BLOOD LOSS:  150 cc.  COMPLICATIONS:  There was a small leak from the anastomosis that was drained and diverted.  DISPOSITION:  To unit in stable condition.  INDICATIONS:  A 60 year old female who had some bright red blood noticed in her stool.  She underwent a colonoscopy with the finding of a proximal rectal mass and the biopsy showed invasive adenocarcinoma.  I then referred her to see Dr. Christella Hartigan who did a NIVA that showed about an 8 cm proximal to mid rectal lesion that was inked at that time.  This appeared to be a uT1 N0 mass.  A CT scan really showed no evidence of any abnormalities.  She had undergone a hysterectomy just a little over a year ago.  We discussed a low-anterior resection for her disease.  PROCEDURE IN DETAIL:  After informed consent was obtained, the patient was taken to the operating room.  She had administered a prep at home. She was given Entereg as well as subcutaneous heparin prior to beginning in the operating room.  She was also given intravenous ertapenem.  She was then  placed under general endotracheal anesthesia without complication.  She then had ureteral stents placed by Dr. Marcine Matar prior to me beginning.  After this, I then came in.  She was then prepped and draped in standard sterile surgical fashion.  She was placed in low lithotomy.  A surgical time-out was then performed.   I then made an incision in her left upper quadrant, I accessed her with an Optiview trocar without any difficulty.  Her abdomen was insufflated to 15 mmHg.  She was very short and obese. She really had no space and she had some significant adhesive disease, just like her keloids as she had on the outside.  I lysed these adhesions for about 20 minutes and decided to perform an open low-anterior resection as the bowel was concreted to the peritoneum in several places and I was concerned about making enterotomies.  I then made an incision.  I lysed the anterior abdominal wall adhesions for about 45 minutes.  I was able to eventually free these.  I did make 3 serosal tears, which I repaired with 3-0 silk Lembert sutures.  After this, we were then eventually able to place a Bookwalter retractor and I released  the left colon along the white line of Toldt.  She had a significant amount of scar throughout her pelvis and even between the  bowel as well and I did spend probably another half hour taking down small bowel adhesions to small bowel as well as the colon sharply.  All of this was inspected several times and there were no other serosal tears or any enterotomies that were noted. Eventually, I was able to identify the location of the rectal tumor. This appeared to be at about 7 or 8 cm.  I then used cautery to release the sides of the rectum and then eventually what I did was I encircled the proximal rectum and divided this with a stapler.  I then removed her mesentery right along the sacrum with the LigaSure stapler to excise her entire mesorectum.  I did divide several  veins and oversew them with 2-0 silk suture.  Coming down on this, the ureters were identified and completely preserved throughout the operation.  They were easy to identify due to the stents in place. The bladder was similarly not injured.  Eventually, I was able to come anteriorly and the tumor was noted  and her vaginal cuff was very adherent to her rectum. The tattoo was noted from endoscopy.  I then went below and confirmed position of the tumor to ensure the tattoo was at the location we thought it was. This was taken down with cautery with some difficulty.  This was a very difficult pelvic dissection as she really had concrete adhesions throughout her entire pelvis.  Eventually, after about an hour and a half to two hours of work, we were able to encircle the rectum and then I placed a Contour stapler below the tattoo as well as the lesion.  This was then come across.  We then finished dividing the remainder of the attachments with the LigaSure device.  I then opened the specimen.  I had about 1 cm of margin to my staple line.  This was at about 5 cm at this point and the pathologist also confirmed this.  Then we prepared the proximal colon, which went down the rectum without any tension and used a EEA sizer to place a 29-mm anvil and then oversewed this with #1 Prolene.  I then went below and Dr. Luisa Hart was above.  We then inserted the stapler, we took great care to avoid the vaginal cuff.  This was allowed to come out and was mated.  This was then closed down and fired.  There were 2 complete donuts with this, in both of them and I sent the distal margin.  This should give [place] enough to plenty of distal margin to be clear in terms of treatment for her tumor.  There were no other abnormalities related to her rectal cancer anywhere in her abdomen upon our thorough exploration as well.  We then removed the stapler.  There was a moderate leak that looked like it was coming from  posterior, only when I filled the rectum quite vigorously.  It was very difficult to see.  I placed my finger inside the rectum.  The anastomosis and the staple line, all appeared to be intact.  We then decided to just drain and divert her at this point.  We really did not have another option to try to repair this due to its location and her habitus and I think it should heal with diversion.  So we irrigated and drained the pelvis and  following this, we then found a portion of her ileum, brought this up through the abdominal wall.  It was very difficult to bring her ileum through her abdominal wall due to her body habitus and it was difficult not to tear this while we were doing it as well, but her body wall was about 6-7 cm thick and she had a very foreshortened mesentery. Eventually, I was able to bring this up.  We then closed her fascia with #1 looped PDS.  We irrigated this.  I then loosely closed this with staples and placed Telfa wicks inside this.  The ileostomy was then matured with 3-0 Vicryl.  I did place a bar  below the ileostomy as well.  I think her ileostomy may be somewhat difficult to manage, given her body habitus, it was very difficult to bring this up and Brooke it.  Eventually, we fashioned this and both sides appeared to be patent and in good position.  We then placed an ileostomy bag and a sterile dressing over this.  I removed her stent at the completion of the operation.  She tolerated this well, and was extubated and transferred to recovery room.     Juanetta Gosling, MD     MCW/MEDQ  D:  05/19/2011  T:  05/19/2011  Job:  [id number]  cc:   [name], NP Fax: [phone]  Rachael Fee, MD [address] [place], [place] [address]  Venita Lick. Russella Dar, MD, FACG [address] [place] [address]  Electronically Signed by Emelia Loron MD on 05/20/2011 10:35:31 AM

## 2011-05-21 LAB — BASIC METABOLIC PANEL
CO2: 29 mEq/L (ref 19–32)
Calcium: 8.4 mg/dL (ref 8.4–10.5)
Creatinine, Ser: 0.88 mg/dL (ref 0.50–1.10)
Glucose, Bld: 100 mg/dL — ABNORMAL HIGH (ref 70–99)

## 2011-05-21 LAB — CBC
HCT: 31.5 % — ABNORMAL LOW (ref 36.0–46.0)
Hemoglobin: 10.2 g/dL — ABNORMAL LOW (ref 12.0–15.0)
MCH: 27.9 pg (ref 26.0–34.0)
MCV: 86.3 fL (ref 78.0–100.0)
RBC: 3.65 MIL/uL — ABNORMAL LOW (ref 3.87–5.11)

## 2011-05-22 LAB — CBC
MCH: 27.1 pg (ref 26.0–34.0)
MCV: 87.5 fL (ref 78.0–100.0)
Platelets: 219 10*3/uL (ref 150–400)
RDW: 15.3 % (ref 11.5–15.5)

## 2011-05-22 LAB — BASIC METABOLIC PANEL
BUN: 7 mg/dL (ref 6–23)
CO2: 28 mEq/L (ref 19–32)
Calcium: 8.8 mg/dL (ref 8.4–10.5)
Creatinine, Ser: 0.81 mg/dL (ref 0.50–1.10)
GFR calc Af Amer: >90 mL/min (ref >90)

## 2011-05-25 LAB — CBC
MCHC: 32.4 g/dL (ref 30.0–36.0)
MCV: 85.4 fL (ref 78.0–100.0)
Platelets: 276 10*3/uL (ref 150–400)
RDW: 15.1 % (ref 11.5–15.5)
WBC: 6.3 10*3/uL (ref 4.0–10.5)

## 2011-05-25 LAB — COMPREHENSIVE METABOLIC PANEL
AST: 38 U/L — ABNORMAL HIGH (ref 0–37)
Albumin: 2.5 g/dL — ABNORMAL LOW (ref 3.5–5.2)
Chloride: 103 mEq/L (ref 96–112)
Creatinine, Ser: 0.86 mg/dL (ref 0.50–1.10)
Total Bilirubin: 0.6 mg/dL (ref 0.3–1.2)
Total Protein: 5.7 g/dL — ABNORMAL LOW (ref 6.0–8.3)

## 2011-05-25 LAB — MAGNESIUM: Magnesium: 2.1 mg/dL (ref 1.5–2.5)

## 2011-05-25 NOTE — Op Note (Signed)
  NAMEKELE, WITHEM NO.:  [id number]  MEDICAL RECORD NO.:  [id number]  LOCATION:  1236                         FACILITY:  [facility]  PHYSICIAN:  Bertram Millard. Jamier Urbas, M.D.DATE OF BIRTH:  [birth year]  DATE OF PROCEDURE:  05/19/2011 DATE OF DISCHARGE:                              OPERATIVE REPORT   PREOPERATIVE DIAGNOSIS:  Colorectal cancer.  POSTOPERATIVE DIAGNOSIS:  Colorectal cancer.  PRINCIPLE PROCEDURES: 1. Cystoscopy. 2. Placement of bilateral ureteral catheters.  SURGEON:  Bertram Millard. Ladesha Pacini, M.D.  ANESTHESIA:  General endotracheal.  COMPLICATIONS:  None.  BRIEF HISTORY:  This 60 year old female presents at this time for surgical management of colorectal cancer.  Dr. Dwain Sarna is planning resection on her because of her colon cancer, as well as a fair size of this, he asked me to place ureteral catheters to be better able to identify the ureters intraoperatively.  I met with the patient before the procedure, described the procedure as well as risks and complications.  She accepts these and desires to proceed.  DESCRIPTION OF PROCEDURE:  The patient was identified in the holding area.  She was taken the operating room where general endotracheal anesthetic was administered.  She was placed in the dorsal lithotomy position.  Genitalia and perineum were prepped and draped.  Time-out was then performed.  I then placed a 22-French panendoscope in her bladder. Urethra and bladder were normal.  Both ureteral orifices were normal in configuration and location.  With the help of a guidewire, I placed 6- French end-hole catheters in each ureter, and easily passed each one up approximately 25 cm.  The guidewire was removed from each of these, the scope was removed, and catheters left indwelling.  I placed a 16-French Foley catheter and tied both catheters to the ureteral catheters to the Foley catheter.  A Goldberg catheter adapter was then placed, and  the Foley catheter as well as ureteral catheters were both drained.  This was hooked to a bedside bag.  At this point, the procedure was terminated.  The patient was prepared for Dr. Doreen Salvage procedure.    Bertram Millard. Retta Diones, M.D.    SMD/MEDQ  D:  05/21/2011  T:  05/21/2011  Job:  [id number]  Electronically Signed by Marcine Matar M.D. on 05/25/2011 06:01:31 PM

## 2011-05-26 ENCOUNTER — Inpatient Hospital Stay (HOSPITAL_COMMUNITY)

## 2011-05-27 ENCOUNTER — Other Ambulatory Visit (INDEPENDENT_AMBULATORY_CARE_PROVIDER_SITE_OTHER): Payer: Self-pay | Admitting: General Surgery

## 2011-05-27 DIAGNOSIS — K631 Perforation of intestine (nontraumatic): Secondary | ICD-10-CM

## 2011-05-27 DIAGNOSIS — K55059 Acute (reversible) ischemia of intestine, part and extent unspecified: Secondary | ICD-10-CM

## 2011-05-27 HISTORY — PX: COLOSTOMY: SHX63

## 2011-05-27 LAB — BASIC METABOLIC PANEL
BUN: 5 mg/dL — ABNORMAL LOW (ref 6–23)
CO2: 26 mEq/L (ref 19–32)
Chloride: 103 mEq/L (ref 96–112)
Creatinine, Ser: 0.86 mg/dL (ref 0.50–1.10)
Glucose, Bld: 102 mg/dL — ABNORMAL HIGH (ref 70–99)
Potassium: 4 mEq/L (ref 3.5–5.1)

## 2011-05-27 LAB — CBC
HCT: 33.6 % — ABNORMAL LOW (ref 36.0–46.0)
Hemoglobin: 10.4 g/dL — ABNORMAL LOW (ref 12.0–15.0)
MCV: 86.6 fL (ref 78.0–100.0)
RBC: 3.88 MIL/uL (ref 3.87–5.11)
RDW: 15.4 % (ref 11.5–15.5)
WBC: 8.1 10*3/uL (ref 4.0–10.5)

## 2011-05-28 LAB — CBC
HCT: 33.1 % — ABNORMAL LOW (ref 36.0–46.0)
MCH: 27.6 pg (ref 26.0–34.0)
MCV: 85.3 fL (ref 78.0–100.0)
Platelets: 369 10*3/uL (ref 150–400)
RDW: 15.3 % (ref 11.5–15.5)
WBC: 18.5 10*3/uL — ABNORMAL HIGH (ref 4.0–10.5)

## 2011-05-28 LAB — BASIC METABOLIC PANEL
BUN: 8 mg/dL (ref 6–23)
CO2: 26 mEq/L (ref 19–32)
Calcium: 8.5 mg/dL (ref 8.4–10.5)
Chloride: 103 mEq/L (ref 96–112)
Creatinine, Ser: 1 mg/dL (ref 0.50–1.10)
Glucose, Bld: 121 mg/dL — ABNORMAL HIGH (ref 70–99)

## 2011-05-29 LAB — CBC
HCT: 28.1 % — ABNORMAL LOW (ref 36.0–46.0)
Hemoglobin: 8.8 g/dL — ABNORMAL LOW (ref 12.0–15.0)
MCH: 27.1 pg (ref 26.0–34.0)
MCHC: 31.3 g/dL (ref 30.0–36.0)
RDW: 15.5 % (ref 11.5–15.5)

## 2011-05-29 LAB — COMPREHENSIVE METABOLIC PANEL
Albumin: 1.9 g/dL — ABNORMAL LOW (ref 3.5–5.2)
Alkaline Phosphatase: 66 U/L (ref 39–117)
BUN: 12 mg/dL (ref 6–23)
Calcium: 8.3 mg/dL — ABNORMAL LOW (ref 8.4–10.5)
GFR calc Af Amer: 80 mL/min — ABNORMAL LOW (ref >90)
Glucose, Bld: 93 mg/dL (ref 70–99)
Potassium: 4.2 mEq/L (ref 3.5–5.1)
Sodium: 136 mEq/L (ref 135–145)
Total Protein: 4.9 g/dL — ABNORMAL LOW (ref 6.0–8.3)

## 2011-05-29 LAB — GLUCOSE, CAPILLARY
Glucose-Capillary: 111 mg/dL — ABNORMAL HIGH (ref 70–99)
Glucose-Capillary: 82 mg/dL (ref 70–99)

## 2011-05-29 LAB — MAGNESIUM: Magnesium: 2.2 mg/dL (ref 1.5–2.5)

## 2011-05-29 NOTE — Op Note (Signed)
Jasmine Clayton, Jasmine NO.:  [id number]  MEDICAL RECORD NO.:  [id number]  LOCATION:  1522                         FACILITY:  [facility]  PHYSICIAN:  Juanetta Gosling, MDDATE OF BIRTH:  1950-09-07  DATE OF PROCEDURE:  05/27/2011 DATE OF DISCHARGE:                              OPERATIVE REPORT   PREOPERATIVE DIAGNOSIS:  Status post low anterior resection for stage IIIA rectal cancer and chronic ileostomy.  POSTOPERATIVE DIAGNOSIS:  Status post low anterior resection for stage IIIA rectal cancer with chronic ileostomy.  PROCEDURES: 1. Exploratory laparotomy, lysis of adhesions. 2. Ileocecectomy with primary ileocecal anastomosis. 3. Diverting transverse colostomy.  SURGEON:  Juanetta Gosling, MD  ASSISTANT:  Abigail Miyamoto, M.D.  ANESTHESIA:  General.  SPECIMENS:  Ileocecum to Pathology as well as ileostomy.  ESTIMATED BLOOD LOSS:  Minimal.  COMPLICATIONS:  None.  DISPOSITION:  To recovery room in stable condition.  COUNTS:  Sponge and needle counts were correct x2 at end of operation.  INDICATIONS:  This is a 60 year old female who is now postop day #8 from a low anterior resection.  This ended up being a stage IIIA rectal cancer.  She was a very difficult procedure to begin with, had a lot of scar tissue from her prior hysterectomy, and this was a very difficult operation.  I did a diverting ileostomy due to a small leak and the fact that her anastomosis was at about 5 cm upon completion.  Her body habitus made it very difficult to do ileostomy and this has become necrotic over the last couple of days and is not functioning well.  We discussed going back to the operating room.  I was very concerned about this given the amount of scar tissue that she has had previously and we discussed these risks before we went back to the operating room.  PROCEDURE IN DETAIL:  After informed consent was obtained, the patient was taken back to the operating  room.  She had sequential compression devices placed on lower extremities.  She had 1 g of intravenous ertapenem administered.  She was placed under general anesthesia without complication.  Her abdomen was prepped and draped in standard sterile surgical fashion.  We opened her wound, she had a small pocket of fluid at the superior portion.  Her ileostomy was clearly dead.  I had explored this locally prior to doing this, and I needed a laparotomy to repair this.  Upon entering her abdomen, she had a significant amount of scar tissue on postoperative day, which made some of the procedure prohibitive to even release some of the bowel and bring the right colon up, we reduced the ileostomy into the abdomen.  I resected the ileostomy.  Eventually, I decided to do an ileocecectomy and reattach the proximal ileum to the colon.  This was done with staples and the LigaSure device.  I then aligned these areas.  There were not any tension, did a stable side-to-side anastomosis.  I closed the common defect with silk sutures.  I then oversewed the staple line as well as the common enterotomy.  I put two crotch stitches in to buttress this. This did not appear to leak and appeared to be  healthy.  Her bowel, however, was all in a very poor condition during the operation, but there was no other real option to take care of this.  We then closed the ileostomy defect with 0 Vicryl, then I closed this anteriorly with #1 Novafil.  We then freed up the transverse colon.  This is the only place that I was going to be able to do a diversion.  There was no way to reconnect due to the fact that her anastomosis had a leak and my concern for that, so we brought up the transverse colostomy right through her wound.  I then stapled part of her wound and placed wet dressings in her wound. I then matured the colostomy with 3-0 Vicryl at the top portion and the center of the wound.  This was all healthy upon completion.   She was extubated and transferred to recovery room in stable condition.  We will discuss with her the findings when she wakes up.     Juanetta Gosling, MD     MCW/MEDQ  D:  05/27/2011  T:  05/27/2011  Job:  [id number]  cc:   Rachael Fee, MD [address] [place], [place] [address]  [name], NP Fax: [phone]  Electronically Signed by Emelia Loron MD on 05/29/2011 11:42:46 AM

## 2011-05-30 LAB — CBC
Hemoglobin: 7.8 g/dL — ABNORMAL LOW (ref 12.0–15.0)
MCH: 26.8 pg (ref 26.0–34.0)
MCV: 85.9 fL (ref 78.0–100.0)
Platelets: 308 10*3/uL (ref 150–400)
RBC: 2.91 MIL/uL — ABNORMAL LOW (ref 3.87–5.11)
WBC: 12.7 10*3/uL — ABNORMAL HIGH (ref 4.0–10.5)

## 2011-05-30 LAB — COMPREHENSIVE METABOLIC PANEL
ALT: 33 U/L (ref 0–35)
AST: 11 U/L (ref 0–37)
CO2: 26 mEq/L (ref 19–32)
Chloride: 104 mEq/L (ref 96–112)
Creatinine, Ser: 0.77 mg/dL (ref 0.50–1.10)
GFR calc Af Amer: >90 mL/min (ref >90)
GFR calc non Af Amer: >90 mL/min (ref >90)
Glucose, Bld: 113 mg/dL — ABNORMAL HIGH (ref 70–99)
Total Bilirubin: 0.2 mg/dL — ABNORMAL LOW (ref 0.3–1.2)

## 2011-05-30 LAB — DIFFERENTIAL
Eosinophils Absolute: 0.1 10*3/uL (ref 0.0–0.7)
Lymphocytes Relative: 11 % — ABNORMAL LOW (ref 12–46)
Lymphs Abs: 1.3 10*3/uL (ref 0.7–4.0)
Monocytes Relative: 7 % (ref 3–12)
Neutro Abs: 10.4 10*3/uL — ABNORMAL HIGH (ref 1.7–7.7)
Neutrophils Relative %: 82 % — ABNORMAL HIGH (ref 43–77)

## 2011-05-30 LAB — GLUCOSE, CAPILLARY: Glucose-Capillary: 91 mg/dL (ref 70–99)

## 2011-05-30 LAB — PREALBUMIN: Prealbumin: 5 mg/dL — ABNORMAL LOW (ref 17.0–34.0)

## 2011-05-31 LAB — DIFFERENTIAL
Basophils Absolute: 0 10*3/uL (ref 0.0–0.1)
Basophils Relative: 0 % (ref 0–1)
Eosinophils Absolute: 0 10*3/uL (ref 0.0–0.7)
Neutro Abs: 7 10*3/uL (ref 1.7–7.7)
Neutrophils Relative %: 76 % (ref 43–77)

## 2011-05-31 LAB — COMPREHENSIVE METABOLIC PANEL
ALT: 29 U/L (ref 0–35)
AST: 13 U/L (ref 0–37)
Albumin: 1.9 g/dL — ABNORMAL LOW (ref 3.5–5.2)
Alkaline Phosphatase: 61 U/L (ref 39–117)
BUN: 9 mg/dL (ref 6–23)
Potassium: 3.5 mEq/L (ref 3.5–5.1)
Sodium: 141 mEq/L (ref 135–145)
Total Protein: 5.5 g/dL — ABNORMAL LOW (ref 6.0–8.3)

## 2011-05-31 LAB — GLUCOSE, CAPILLARY
Glucose-Capillary: 121 mg/dL — ABNORMAL HIGH (ref 70–99)
Glucose-Capillary: 96 mg/dL (ref 70–99)
Glucose-Capillary: 125 mg/dL — ABNORMAL HIGH (ref 70–99)

## 2011-05-31 LAB — CBC
Hemoglobin: 8.2 g/dL — ABNORMAL LOW (ref 12.0–15.0)
Platelets: 337 10*3/uL (ref 150–400)
RBC: 3.04 MIL/uL — ABNORMAL LOW (ref 3.87–5.11)
WBC: 9.3 10*3/uL (ref 4.0–10.5)

## 2011-05-31 LAB — MAGNESIUM: Magnesium: 2.2 mg/dL (ref 1.5–2.5)

## 2011-05-31 LAB — TRIGLYCERIDES: Triglycerides: 179 mg/dL — ABNORMAL HIGH (ref <150)

## 2011-05-31 LAB — PREALBUMIN: Prealbumin: 5.6 mg/dL — ABNORMAL LOW (ref 17.0–34.0)

## 2011-06-01 LAB — GLUCOSE, CAPILLARY
Glucose-Capillary: 117 mg/dL — ABNORMAL HIGH (ref 70–99)
Glucose-Capillary: 115 mg/dL — ABNORMAL HIGH (ref 70–99)
Glucose-Capillary: 90 mg/dL (ref 70–99)

## 2011-06-01 LAB — BASIC METABOLIC PANEL
CO2: 29 mEq/L (ref 19–32)
Chloride: 102 mEq/L (ref 96–112)
Glucose, Bld: 117 mg/dL — ABNORMAL HIGH (ref 70–99)
Potassium: 3.4 mEq/L — ABNORMAL LOW (ref 3.5–5.1)
Sodium: 138 mEq/L (ref 135–145)

## 2011-06-02 LAB — GLUCOSE, CAPILLARY
Glucose-Capillary: 117 mg/dL — ABNORMAL HIGH (ref 70–99)
Glucose-Capillary: 113 mg/dL — ABNORMAL HIGH (ref 70–99)

## 2011-06-02 LAB — BASIC METABOLIC PANEL
Calcium: 9.1 mg/dL (ref 8.4–10.5)
Creatinine, Ser: 0.7 mg/dL (ref 0.50–1.10)
GFR calc non Af Amer: >90 mL/min (ref >90)
Sodium: 138 mEq/L (ref 135–145)

## 2011-06-03 LAB — COMPREHENSIVE METABOLIC PANEL
ALT: 163 U/L — ABNORMAL HIGH (ref 0–35)
Albumin: 2.1 g/dL — ABNORMAL LOW (ref 3.5–5.2)
Alkaline Phosphatase: 77 U/L (ref 39–117)
BUN: 10 mg/dL (ref 6–23)
Chloride: 103 mEq/L (ref 96–112)
Glucose, Bld: 129 mg/dL — ABNORMAL HIGH (ref 70–99)
Potassium: 3.8 mEq/L (ref 3.5–5.1)
Sodium: 139 mEq/L (ref 135–145)
Total Bilirubin: 0.2 mg/dL — ABNORMAL LOW (ref 0.3–1.2)
Total Protein: 5.6 g/dL — ABNORMAL LOW (ref 6.0–8.3)

## 2011-06-03 LAB — GLUCOSE, CAPILLARY
Glucose-Capillary: 122 mg/dL — ABNORMAL HIGH (ref 70–99)
Glucose-Capillary: 112 mg/dL — ABNORMAL HIGH (ref 70–99)

## 2011-06-03 LAB — MAGNESIUM: Magnesium: 2 mg/dL (ref 1.5–2.5)

## 2011-06-04 LAB — CBC
HCT: 26.7 % — ABNORMAL LOW (ref 36.0–46.0)
Hemoglobin: 8.5 g/dL — ABNORMAL LOW (ref 12.0–15.0)
MCV: 84.5 fL (ref 78.0–100.0)
RBC: 3.16 MIL/uL — ABNORMAL LOW (ref 3.87–5.11)
RDW: 15.5 % (ref 11.5–15.5)
WBC: 8.3 10*3/uL (ref 4.0–10.5)

## 2011-06-04 LAB — GLUCOSE, CAPILLARY
Glucose-Capillary: 126 mg/dL — ABNORMAL HIGH (ref 70–99)
Glucose-Capillary: 128 mg/dL — ABNORMAL HIGH (ref 70–99)

## 2011-06-04 LAB — BASIC METABOLIC PANEL
BUN: 10 mg/dL (ref 6–23)
CO2: 29 mEq/L (ref 19–32)
Chloride: 101 mEq/L (ref 96–112)
Creatinine, Ser: 0.74 mg/dL (ref 0.50–1.10)
Glucose, Bld: 122 mg/dL — ABNORMAL HIGH (ref 70–99)

## 2011-06-04 MED ORDER — BIOTENE DRY MOUTH MT LIQD
15.0000 mL | Freq: Two times a day (BID) | OROMUCOSAL | Status: DC
  Administered 2011-06-06 – 2011-06-10 (×7): 15 mL via OROMUCOSAL
  Filled 2011-06-04 (×17): qty 15

## 2011-06-04 MED ORDER — SODIUM CHLORIDE 0.9 % IV SOLN
INTRAVENOUS | Status: DC
  Administered 2011-06-06 – 2011-06-11 (×4): via INTRAVENOUS

## 2011-06-04 MED ORDER — ENOXAPARIN SODIUM 40 MG/0.4ML ~~LOC~~ SOLN
40.0000 mg | Freq: Every day | SUBCUTANEOUS | Status: DC
  Administered 2011-06-06 – 2011-06-10 (×5): 40 mg via SUBCUTANEOUS
  Filled 2011-06-04 (×8): qty 0.4

## 2011-06-04 MED ORDER — INSULIN ASPART 100 UNIT/ML ~~LOC~~ SOLN
0.0000 [IU] | Freq: Four times a day (QID) | SUBCUTANEOUS | Status: DC
  Administered 2011-06-06 – 2011-06-09 (×9): 1 [IU] via SUBCUTANEOUS

## 2011-06-04 MED ORDER — SODIUM CHLORIDE 0.9 % IJ SOLN: 9.0000 mL | INTRAMUSCULAR | Status: DC | PRN

## 2011-06-04 MED ORDER — ONDANSETRON HCL 4 MG/2ML IJ SOLN: 4.0000 mg | Freq: Four times a day (QID) | INTRAMUSCULAR | Status: DC | PRN

## 2011-06-04 MED ORDER — ACETAMINOPHEN 325 MG PO TABS: 325.0000 mg | ORAL_TABLET | Freq: Four times a day (QID) | ORAL | Status: DC | PRN

## 2011-06-04 MED ORDER — CHLORHEXIDINE GLUCONATE 0.12 % MT SOLN
15.0000 mL | Freq: Two times a day (BID) | OROMUCOSAL | Status: DC
  Administered 2011-06-06 – 2011-06-10 (×6): 15 mL via OROMUCOSAL
  Filled 2011-06-04 (×17): qty 15

## 2011-06-04 MED ORDER — DIPHENHYDRAMINE HCL 12.5 MG/5ML PO ELIX: 12.5000 mg | ORAL_SOLUTION | Freq: Four times a day (QID) | ORAL | Status: DC | PRN

## 2011-06-04 MED ORDER — DIPHENHYDRAMINE HCL 50 MG/ML IJ SOLN: 12.5000 mg | Freq: Four times a day (QID) | INTRAMUSCULAR | Status: DC | PRN

## 2011-06-04 MED ORDER — PANTOPRAZOLE SODIUM 40 MG IV SOLR
40.0000 mg | Freq: Every day | INTRAVENOUS | Status: DC
  Administered 2011-06-06 – 2011-06-09 (×4): 40 mg via INTRAVENOUS
  Filled 2011-06-04 (×7): qty 40

## 2011-06-04 MED ORDER — SODIUM CHLORIDE 0.9 % IJ SOLN
10.0000 mL | INTRAMUSCULAR | Status: DC | PRN
  Administered 2011-06-08 – 2011-06-10 (×2): 10 mL via INTRAVENOUS

## 2011-06-04 MED ORDER — NALOXONE HCL 0.4 MG/ML IJ SOLN: 0.4000 mg | INTRAMUSCULAR | Status: DC | PRN

## 2011-06-04 MED ORDER — SODIUM CHLORIDE 0.9 % IJ SOLN
10.0000 mL | Freq: Two times a day (BID) | INTRAMUSCULAR | Status: DC
  Administered 2011-06-06 – 2011-06-10 (×9): 10 mL via INTRAVENOUS

## 2011-06-04 MED ORDER — MORPHINE SULFATE (PF) 1 MG/ML IV SOLN
INTRAVENOUS | Status: DC
  Administered 2011-06-06: 0.699 mg via INTRAVENOUS
  Administered 2011-06-06: 4.5 mg via INTRAVENOUS
  Administered 2011-06-06: 3.9 mg via INTRAVENOUS
  Administered 2011-06-06: 1 mg via INTRAVENOUS
  Filled 2011-06-04 (×3): qty 25

## 2011-06-04 MED ORDER — PHENOL 1.4 % MT LIQD: 1.0000 | OROMUCOSAL | Status: DC | PRN

## 2011-06-05 ENCOUNTER — Other Ambulatory Visit: Payer: Self-pay

## 2011-06-05 LAB — GLUCOSE, CAPILLARY
Glucose-Capillary: 130 mg/dL — ABNORMAL HIGH (ref 70–99)
Glucose-Capillary: 126 mg/dL — ABNORMAL HIGH (ref 70–99)

## 2011-06-05 LAB — BASIC METABOLIC PANEL
Calcium: 9.1 mg/dL (ref 8.4–10.5)
GFR calc non Af Amer: >90 mL/min (ref >90)
Sodium: 137 mEq/L (ref 135–145)

## 2011-06-05 LAB — PREALBUMIN: Prealbumin: 15 mg/dL — ABNORMAL LOW (ref 17.0–34.0)

## 2011-06-05 MED ORDER — CLINIMIX E/DEXTROSE (5/20) 5 % IV SOLN
INTRAVENOUS | Status: AC
  Filled 2011-06-05: qty 2000

## 2011-06-05 MED ORDER — FAT EMULSION 20 % IV EMUL
250.0000 mL | INTRAVENOUS | Status: AC
  Filled 2011-06-05: qty 250

## 2011-06-06 LAB — BASIC METABOLIC PANEL
BUN: 11 mg/dL (ref 6–23)
Calcium: 9.1 mg/dL (ref 8.4–10.5)
GFR calc non Af Amer: >90 mL/min (ref >90)
Glucose, Bld: 130 mg/dL — ABNORMAL HIGH (ref 70–99)

## 2011-06-06 LAB — GLUCOSE, CAPILLARY
Glucose-Capillary: 104 mg/dL — ABNORMAL HIGH (ref 70–99)
Glucose-Capillary: 133 mg/dL — ABNORMAL HIGH (ref 70–99)
Glucose-Capillary: 133 mg/dL — ABNORMAL HIGH (ref 70–99)

## 2011-06-06 MED ORDER — CLINIMIX E/DEXTROSE (5/20) 5 % IV SOLN
INTRAVENOUS | Status: AC
  Administered 2011-06-06: 18:00:00 via INTRAVENOUS
  Filled 2011-06-06: qty 2000

## 2011-06-06 MED ORDER — FAT EMULSION 20 % IV EMUL
250.0000 mL | INTRAVENOUS | Status: AC
  Administered 2011-06-06: 250 mL via INTRAVENOUS
  Filled 2011-06-06: qty 250

## 2011-06-06 NOTE — Plan of Care (Signed)
Problem: Consults Goal: Colostomy/Ileostomy Education (See Patient Education module for education specifics.)  Outcome: Progressing Jasmine Clayton, woc rn in to change ostomy appliance and instructed daughter on ostomy application and care

## 2011-06-06 NOTE — Progress Notes (Signed)
     Subjective: Patient is up to a chair. She is comfortable. She is taking a clear liquid diet. She has no complaints this morning.  Objective: Vital signs in last 24 hours: Temp:  [98.5 F (36.9 C)-99.5 F (37.5 C)] 99.1 F (37.3 C) (11/04 0410) Pulse Rate:  [101-108] 108  (11/04 0410) Resp:  [16-24] 16  (11/04 0538) BP: (92-137)/(54-74) 128/68 mmHg (11/04 0410) SpO2:  [94 %-97 %] 97 % (11/04 0538)    Intake/Output from previous day: 11/03 0701 - 11/04 0700 In: 1470.7 [I.V.:330.1; TPN:1140.6] Out: 1123 [Urine:1100; Drains:23] Intake/Output this shift: Total I/O In: -  Out: 450 [Urine:450]  EXAM:  Abdomen is soft without distention. Stoma in the upper midline is viable with small amount of fecal material in the bag. Dressing has recently been changed and is clean and dry.  Drains have minimal thin cloudy fluid present. No significant tenderness.  Lab Results:   BMET  Basename 06/06/11 0423 06/05/11 0425  NA 137 137  K 4.1 3.8  CL 102 103  CO2 27 29  GLUCOSE 130* 126*  BUN 11 10  CREATININE 0.75 0.75  CALCIUM 9.1 9.1    Studies/Results: No results found.  Anti-infectives: Anti-infectives    None      Assessment/Plan: s/p low anterior resection, diverting colostomy Patient continues on clear liquid diet. Patient is also on TNA. Drains remain in place. Continued slow progress.   LOS: 18 days    Annistyn Depass M 06/06/2011

## 2011-06-06 NOTE — Plan of Care (Signed)
Problem: Consults Goal: Colostomy/Ileostomy Education (See Patient Education module for education specifics.)  See echart for previous care plan documentation

## 2011-06-06 NOTE — Progress Notes (Signed)
  Episode of chest discomfort on Saturday resolved, no problems yesterday, up and around, tolerating clears except for broth TM 99.2 Tc 98.5 98-113 99/65-127/77 94-97 2825/1452 CV tachy rr Lungs CTA bilaterally Ab open wound with pink granulation tissue, I removed staples and a drain today, 1 drain remaining, ostomy functional Hct stable at 26.4 A. S/p LAR for Stage III rectal cancer Ileus Reoperation for necrotic ileostomy Malnutrition ABL anemia P. DC pca and start po pain meds One drain removed other will remain and dc home with Continue tna for now, once she is tolerating fulls with supplements then will start decreasing Cont dressing changes Hopefully home later this week

## 2011-06-06 NOTE — Consults (Signed)
Problem: Consults Goal: Colostomy/Ileostomy Education (See Patient Education module for education specifics.)  See echart for previous care plan documentation   WOC Nurse Note:  Patient seen in follow up for ostomy care and teaching.  Stoma at apex of midline incision: flush, red, moist and 2.25 x 1.75 inches.  Pouching system used previously has been in place x 4 days and there has been no leakage.  Pouch changed so that daughter could see pouch change procedure.  1 Piece Karaya pouch [facility] [id number], [place] [phone]) used after placing a barrier ring [place] [phone]) around stoma.  Patient wearing ostomy appliance belt to increase wear time and confidence.  No questions at this time.  Patient is performing ostomy pouch emptying with some staff assistance and has not yet changed ostomy pouch, but we expect daughter to upon discharge.  I would recommend that pat have HHRN for post discharge support and continued education, as well as wound care.  If you agree, please order. I will continue to follow with you. Thanks, Ladona Mow, MSN, RN, GNP, CWOCN [phone])

## 2011-06-06 NOTE — Progress Notes (Signed)
PARENTERAL NUTRITION CONSULT NOTE - FOLLOW UP  Pharmacy Consult for TPN  Indication: s/p bowel resection  No Known Allergies  Patient Measurements: Height: 5' (152.4 cm) (Entered during cutover) Weight: 254 lb 15.7 oz (115.66 kg) (Entered during cutover) IBW/kg (Calculated) : 45.5  Adjusted Body Weight: 66   Vital Signs: Temp: 99.1 F (37.3 C) (11/04 0410) Temp src: Oral (11/04 0410) BP: 128/68 mmHg (11/04 0410) Pulse Rate: 108  (11/04 0410) Intake/Output from previous day: 11/03 0701 - 11/04 0700 In: 1105.3 [I.V.:247.5; TPN:857.8] Out: 1123 [Urine:1100; Drains:23] Intake/Output from this shift:    Labs:  [facility] 06/04/11 0508  WBC 8.3  HGB 8.5*  HCT 26.7*  PLT 416*  APTT --  INR --     Basename 06/06/11 0423 06/05/11 0425 06/04/11 0505  NA 137 137 137  K 4.1 3.8 3.8  CL 102 103 101  CO2 27 29 29   GLUCOSE 130* 126* 122*  BUN 11 10 10   CREATININE 0.75 0.75 0.74  LABCREA -- -- --  CREAT24HRUR -- -- --  CALCIUM 9.1 9.1 9.1  MG -- -- --  PHOS -- -- --  PROT -- -- --  ALBUMIN -- -- --  AST -- -- --  ALT -- -- --  ALKPHOS -- -- --  BILITOT -- -- --  BILIDIR -- -- --  IBILI -- -- --  PREALBUMIN -- 15.0* --  CHOLHDL -- -- --  CHOL -- -- --   Estimated Creatinine Clearance: 88 ml/min (by C-G formula based on Cr of 0.75).    Basename 06/05/11 2318 06/05/11 1754 06/05/11 1214  GLUCAP 140* 110* 117*    Medications:  Scheduled:    . antiseptic oral rinse  15 mL Mouth Rinse q12n4p  . chlorhexidine  15 mL Mouth/Throat BID  . enoxaparin (LOVENOX) injection  40 mg Subcutaneous Daily  . insulin aspart  0-9 Units Subcutaneous Q6H  . morphine   Intravenous Q4H  . pantoprazole (PROTONIX) IV  40 mg Intravenous QHS  . sodium chloride  10 mL Intravenous Q12H     Current Nutrition: CL diet.    Assessment: Tolerating TNA at goal.  Chemistries stable. Prealbumin has improved. Await ability to tolerate oral diet.   Plan:  Continue same TNA. Full set of  TNA labs in the AM.  Reece Packer 06/06/2011,8:14 AM

## 2011-06-07 LAB — COMPREHENSIVE METABOLIC PANEL
ALT: 89 U/L — ABNORMAL HIGH (ref 0–35)
Albumin: 2.3 g/dL — ABNORMAL LOW (ref 3.5–5.2)
Alkaline Phosphatase: 90 U/L (ref 39–117)
BUN: 11 mg/dL (ref 6–23)
Chloride: 101 mEq/L (ref 96–112)
GFR calc Af Amer: >90 mL/min (ref >90)
Glucose, Bld: 127 mg/dL — ABNORMAL HIGH (ref 70–99)
Potassium: 3.9 mEq/L (ref 3.5–5.1)
Sodium: 137 mEq/L (ref 135–145)
Total Bilirubin: 0.3 mg/dL (ref 0.3–1.2)
Total Protein: 6 g/dL (ref 6.0–8.3)

## 2011-06-07 LAB — CBC
Hemoglobin: 8.1 g/dL — ABNORMAL LOW (ref 12.0–15.0)
MCH: 26.2 pg (ref 26.0–34.0)
MCHC: 30.7 g/dL (ref 30.0–36.0)
MCV: 85.4 fL (ref 78.0–100.0)
RBC: 3.09 MIL/uL — ABNORMAL LOW (ref 3.87–5.11)

## 2011-06-07 LAB — DIFFERENTIAL
Basophils Relative: 0 % (ref 0–1)
Eosinophils Absolute: 0.2 10*3/uL (ref 0.0–0.7)
Eosinophils Relative: 2 % (ref 0–5)
Lymphs Abs: 1.5 10*3/uL (ref 0.7–4.0)
Monocytes Absolute: 1 10*3/uL (ref 0.1–1.0)
Monocytes Relative: 10 % (ref 3–12)
Neutrophils Relative %: 74 % (ref 43–77)

## 2011-06-07 LAB — GLUCOSE, CAPILLARY
Glucose-Capillary: 139 mg/dL — ABNORMAL HIGH (ref 70–99)
Glucose-Capillary: 127 mg/dL — ABNORMAL HIGH (ref 70–99)

## 2011-06-07 LAB — MAGNESIUM: Magnesium: 2.1 mg/dL (ref 1.5–2.5)

## 2011-06-07 MED ORDER — FAT EMULSION 20 % IV EMUL
250.0000 mL | INTRAVENOUS | Status: AC
  Administered 2011-06-07: 250 mL via INTRAVENOUS
  Filled 2011-06-07: qty 250

## 2011-06-07 MED ORDER — TRACE MINERALS CR-CU-MN-SE-ZN 10-1000-500-60 MCG/ML IV SOLN
INTRAVENOUS | Status: AC
  Administered 2011-06-07: 18:00:00 via INTRAVENOUS
  Filled 2011-06-07: qty 2000

## 2011-06-07 MED ORDER — MORPHINE SULFATE 2 MG/ML IJ SOLN
2.0000 mg | INTRAMUSCULAR | Status: DC | PRN
  Administered 2011-06-08 – 2011-06-11 (×4): 2 mg via INTRAVENOUS
  Filled 2011-06-07 (×4): qty 1

## 2011-06-07 MED ORDER — OXYCODONE-ACETAMINOPHEN 5-325 MG PO TABS
1.0000 | ORAL_TABLET | ORAL | Status: DC | PRN
  Administered 2011-06-07 (×2): 1 via ORAL
  Administered 2011-06-07: 2 via ORAL
  Administered 2011-06-08: 1 via ORAL
  Administered 2011-06-08: 2 via ORAL
  Administered 2011-06-08: 1 via ORAL
  Administered 2011-06-09 – 2011-06-11 (×10): 2 via ORAL
  Filled 2011-06-07 (×3): qty 2
  Filled 2011-06-07: qty 1
  Filled 2011-06-07: qty 2
  Filled 2011-06-07: qty 1
  Filled 2011-06-07: qty 2
  Filled 2011-06-07 (×3): qty 1
  Filled 2011-06-07 (×3): qty 2
  Filled 2011-06-07: qty 1
  Filled 2011-06-07 (×3): qty 2

## 2011-06-07 MED ORDER — ENSURE PUDDING PO PUDG
1.0000 | Freq: Two times a day (BID) | ORAL | Status: DC
  Administered 2011-06-07 – 2011-06-10 (×5): 1 via ORAL
  Filled 2011-06-07 (×12): qty 1

## 2011-06-07 NOTE — Discharge Summary (Addendum)
Jasmine Clayton, Jasmine Clayton NO.:  [id number]  MEDICAL RECORD NO.:  [id number]  LOCATION:  1522                         FACILITY:  [facility]  PHYSICIAN:  Juanetta Gosling, MDDATE OF BIRTH:  1950/12/02  DATE OF ADMISSION:  05/19/2011 DATE OF DISCHARGE:                              DISCHARGE SUMMARY   ADMISSION DIAGNOSES: 1. Rectal cancer. 2. Morbid obesity. 3. History of deep vein thrombosis. 4. Stress incontinence. 5. Degenerative joint disease.  DISCHARGE DIAGNOSES: 1. Rectal cancer. 2. Morbid obesity. 3. History of deep vein thrombosis. 4. Stress incontinence. 5. Degenerative joint disease. 6. Ileus. 7. Protein-calorie malnutrition. 8. Acute blood loss anemia.  PROCEDURES PERFORMED: 1. May 19, 2011; diagnostic laparoscopy with lysis of adhesions     followed by open low anterior resection with primary 29 mm staple     anastomosis at about 5 cm, and diverting ileostomy. 2. May 27, 2011; ileocecectomy with transverse colostomy.  CONSULTATIONS: 1. Wound Ostomy Care Nursing. 2. Physical Therapy. 3. Ladene Artist, M.D., with Medical Oncology.  HISTORY AND HOSPITAL COURSE:  This is a 60 year old female who was noted to have some bright red blood per rectum.  She eventually underwent an endoscopy that showed what appeared to be a low-lying rectal cancer. This was initially read in the proximal rectum.  I had sent her then for an EUS, which showed this to be in the proximal to mid rectum and the biopsy did show adenocarcinoma.  The EUS also showed this appeared to be a uT1 N0 tumor.  She was prepped.  We discussed all of her different options, and we took her to the operating room on May 19, 2011. Further procedure was a very difficult procedure given the fact that she had a significant amount of scar tissue from a hysterectomy previously. She was maintained in the ICU.  Afterwards, I did keep her NG tube.  I was concerned about her ileostomy  immediately afterwards that she had a very short mesentery, and it is very difficult to get out of her abdominal wall, this over the next number of days worsened.  I did remove her NG tube.  She did start to have bowel function at this point, and she was moved to the floor by postop day #7.  I removed her bar to see if this would help and realized that her stoma was necrotic and it needed to be revised.  I took her back to the operating room on the 25th.  Again, this was very difficult given the amount of scar tissue she already had after the other operation and they prohibited [place] from doing a variety of things.  I elected at that point to excise her ileostomy and ended up doing an ileocecectomy and a primary anastomosis of her bowel again from her ileum to her right colon.  Then, I did a transverse colostomy and basically in the superior portion in the lateral aspect of the wound, this is the only piece of bowel that was reached up to her skin at that point.  Postoperatively from this, I maintained her on TPN very slowly with her.  I kept her NG tube in for quite a while, and we maintained  her on TNA due to a protein-calorie malnutrition.  She also has acute blood loss anemia, which accounts for tachycardia afterwards.  She slowly has been advanced to where she is beginning clears today.  She has been getting dressing changes without any real difficulty.  She is tolerating this well.  She has started to have some stool output in her colostomy.  I still have one of her JP drains in her pelvis. Over the last week prior to today, her TNA has been weaned off and she is tolerating a regular diet. One of her drains will remain in place prior to discharge. She is ambulating and her pain is under control. She's going to be discharged home today.  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   Medications upon discharge: Please see medication reconciliation form  Home instructions: She will have once daily wet-to-dry saline dressing changes to her midline and a right lower quadrant abdominal wound. She will need a colostomy care as well as drain care provider by home health nursing. I have encouraged her to be 5 smaller meals per day without any fatty or spicy foods. I've also discussed with her protein supplements which she already has at home. If she has any problems I have instructed her to call [facility] at [phone]. I will call her next week for a followup appointment.  Pertinent laboratory and radiologic information: Please see chart.   [facility]  POST OP INSTRUCTIONS  Always review your discharge instruction sheet given to you by the facility where your surgery was performed. IF YOU HAVE DISABILITY OR FAMILY LEAVE FORMS, YOU MUST BRING THEM TO THE OFFICE FOR PROCESSING.   DO NOT GIVE THEM TO YOUR DOCTOR.  1. A  prescription for pain medication may be given to you upon discharge.  Take your pain medication as prescribed, if needed.  If narcotic pain medicine is not needed, then you may take acetaminophen (Tylenol), naprosyn (Alleve) or ibuprofen (Advil) as needed. 2. Take your usually prescribed medications unless otherwise directed. 3. If you need a refill on your pain medication, please contact your pharmacy.  They will contact our office to request authorization. Prescriptions will not be filled after 5 pm or on week-ends. 4. You should follow a light diet  after arrival home, such as soup and crackers, etc.  Be sure to include lots of fluids daily.  Resume your normal diet as we discussed 5. Most patients will experience some swelling and bruising around the incisions. 6. It is common to experience some constipation if taking  pain medication after surgery.  Increasing fluid intake and taking a stool softener (such as Colace) will usually help or prevent this problem from occurring.  A mild laxative (Milk of Magnesia or Miralax) should be taken according to package directions if there are no bowel movements after 48 hours. 7. ACTIVITIES:  You may resume regular (light) daily activities beginning the next day-such as daily self-care, walking, climbing stairs-gradually increasing activities as tolerated.  You may have sexual intercourse when it is comfortable.  Refrain from any heavy lifting or straining until approved by your doctor. a. You may drive when you are no longer taking prescription pain medication, you can comfortably wear a seatbelt, and you can safely maneuver your car  and apply brakes. 8. You should see your doctor in the office for a follow-up appointment approximately 2-3 weeks after your surgery.  Make sure that you call for this appointment within a day or two after you arrive home to insure a convenient appointment time. 9. OTHER INSTRUCTIONS:  __________________________________________________________________________________________________________________________________________________________________________________________  WHEN TO CALL YOUR DOCTOR: 1. Fever over 101.0 2. Inability to urinate 3. Nausea and/or vomiting 4. Extreme swelling or bruising 5. Continued bleeding from incision. 6. Increased pain, redness, or drainage from the incision  The clinic staff is available to answer your questions during regular business hours.  Please don't hesitate to call and ask to speak to one of the nurses for clinical concerns.  If you have a medical emergency, go to the nearest emergency room or call 911.  A surgeon from [facility] is always on call at the hospital   [address], [place], [place]  [address] ?  [address] [address], [place], [place]   [address] [phone] ?  [phone] ? FAX [phone] Web site: [email or web address]    Juanetta Gosling, MD     MCW/MEDQ  D:  06/04/2011  T:  06/04/2011  Job:  [id number]  Electronically Signed by Emelia Loron MD on 06/07/2011 01:27:53 PM

## 2011-06-07 NOTE — Plan of Care (Signed)
Problem: Phase II Progression Outcomes Goal: Discharge plan established Outcome: Progressing Plan is to be d/c later this week to home with home health.

## 2011-06-07 NOTE — Progress Notes (Signed)
Physical Therapy Treatment Patient Details Name: HENCHY MCCAULEY MRN: [id number] DOB: 1951/06/05 Today's Date: 06/07/2011 [id number] 1Ta; 1Gt  PT Assessment/Plan  PT - Assessment/Plan Comments on Treatment Session: Patient meeting all goals except for bed mobility without HOB elevated and for stairs.  May need hospital bed upon D/C PT Plan: Discharge plan remains appropriate;Equipment recommendations need to be updated PT Frequency: Min 3X/week Follow Up Recommendations: Home health PT Equipment Recommended:  (potential need for hosptital bed TBA closer to D/C) PT Goals  Acute Rehab PT Goals PT Goal Formulation: With patient Time For Goal Achievement: 2 weeks Pt will go Supine/Side to Sit: with supervision (with HOB flat) PT Goal: Supine/Side to Sit - Progress: Progressing toward goal Pt will Transfer Sit to Stand/Stand to Sit: with supervision PT Transfer Goal: Sit to Stand/Stand to Sit - Progress: Met Pt will Ambulate: 51 - 150 feet;with least restrictive assistive device;with supervision PT Goal: Ambulate - Progress: Met Pt will Go Up / Down Stairs: 6-9 stairs;with min assist;with rail(s) PT Goal: Up/Down Stairs - Progress: Progressing toward goal  PT Treatment Precautions/Restrictions  Restrictions Weight Bearing Restrictions: No Mobility (including Balance) Bed Mobility Bed Mobility: Yes Rolling Left: 4: Min assist Rolling Left Details (indicate cue type and reason): with mod cues for technique and unable to complete due to increased pain at surgical site Supine to Sit: 5: Supervision;HOB elevated (Comment degrees);With rails (70-80 degrees) Supine to Sit Details (indicate cue type and reason): increased time to perform due to pain and slow technique Sitting - Scoot to Edge of Bed: 6: Modified independent (Device/Increase time) Sit to Supine - Right: 5: Supervision;HOB elevated (comment degrees) (up to about 80 degrees) Sit to Supine - Right Details (indicate cue type  and reason): painful to attempt with HOB flat Transfers Transfers: Yes Sit to Stand: 6: Modified independent (Device/Increase time);From bed;From chair/3-in-1;From elevated surface;With upper extremity assist Stand to Sit: 6: Modified independent (Device/Increase time);With upper extremity assist;To chair/3-in-1;To bed Ambulation/Gait Ambulation/Gait: Yes Ambulation/Gait Assistance: 6: Modified independent (Device/Increase time) Ambulation Distance (Feet): 400 Feet Assistive device: Rolling walker Gait Pattern: Decreased stride length Gait velocity: slow pace  Stairs: No      End of Session PT - End of Session Activity Tolerance: Patient limited by pain Patient left: in bed General Behavior During Session: [facility] for tasks performed Cognition: [facility] for tasks performed  [employer] 06/07/2011, 5:38 PM

## 2011-06-07 NOTE — Plan of Care (Signed)
Problem: Phase II Progression Outcomes Goal: Tolerating diet Outcome: Progressing Advanced to full liquids today

## 2011-06-07 NOTE — Progress Notes (Signed)
PARENTERAL NUTRITION CONSULT NOTE - FOLLOW UP  Pharmacy Consult for TNA Indication: s/p bowel resection with post-op ileus with ostomy and inadequate oral intake in setting of Stage III rectal CA  No Known Allergies  Patient Measurements: Height: 5' (152.4 cm) (Entered during cutover) Weight: 254 lb 15.7 oz (115.66 kg) (Entered during cutover) IBW/kg (Calculated) : 45.5  Adjusted Body Weight: 66 kg   Vital Signs: Temp: 98.5 F (36.9 C) (11/05 0500) Temp src: Oral (11/05 0500) BP: 127/77 mmHg (11/05 0500) Pulse Rate: 106  (11/05 0500) Intake/Output from previous day: 11/04 0701 - 11/05 0700 In: 2826.8 [P.O.:300; I.V.:795.5; TPN:1731.3] Out: 1457.5 [Urine:1350; Drains:37.5; Stool:70] Intake/Output from this shift: Total I/O In: 10 [I.V.:10] Out: 300 [Urine:300]  Labs:  [facility] 06/07/11 0433  WBC 10.5  HGB 8.1*  HCT 26.4*  PLT 465*  APTT --  INR --     Basename 06/07/11 0434 06/07/11 0433 06/06/11 0423 06/05/11 0425  NA -- 137 137 137  K -- 3.9 4.1 3.8  CL -- 101 102 103  CO2 -- 27 27 29   GLUCOSE -- 127* 130* 126*  BUN -- 11 11 10   CREATININE -- 0.80 0.75 0.75  LABCREA -- -- -- --  CREAT24HRUR -- -- -- --  CALCIUM -- 9.3 9.1 9.1  MG -- 2.1 -- --  PHOS -- 4.6 -- --  PROT -- 6.0 -- --  ALBUMIN -- 2.3* -- --  AST -- 34 -- --  ALT -- 89* -- --  ALKPHOS -- 90 -- --  BILITOT -- 0.3 -- --  BILIDIR -- -- -- --  IBILI -- -- -- --  PREALBUMIN -- -- -- 15.0*  CHOLHDL -- -- -- --  CHOL 132 -- -- --   Estimated Creatinine Clearance: 88 ml/min (by C-G formula based on Cr of 0.8).    Basename 06/07/11 0509 06/06/11 2343 06/06/11 1753  GLUCAP 139* 133* 104*    Medications:  Scheduled:    . antiseptic oral rinse  15 mL Mouth Rinse q12n4p  . chlorhexidine  15 mL Mouth/Throat BID  . enoxaparin (LOVENOX) injection  40 mg Subcutaneous Daily  . feeding supplement  1 Container Oral BID BM  . insulin aspart  0-9 Units Subcutaneous Q6H  . pantoprazole (PROTONIX)  IV  40 mg Intravenous QHS  . sodium chloride  10 mL Intravenous Q12H  . DISCONTD: morphine   Intravenous Q4H    Insulin Requirements in the past 24 hours:  4 units SSI  Current Nutrition:  Full liquid diet starting AM of 11/5  Assessment: 60YOF tolerating TNA at goal nutrition rate of 35ml/hr TNA with 63ml/hr lipids.  1. Nutrition: Continuing TNA at goal rates until pt tolerating full liquids with supplements (this diet order started today). Still minor output from drains. Will d/c home with one drain (other was removed). Will f/u tolerance of advancing diet.  Pre-albumin improved to suggest improved nutritional status. 2. Lytes: wnl, no adjustments needed 3. CBGs: controlled (<152ml/dL) on SSI. Continue SSI.  Nutritional Goals:  70-90 kCal, 1600-1800 grams of protein per day  Plan:  Continue TNA at 59ml/hr and lipids at 39ml/hr.  F/u plan to begin weaning TNA once oral intake improved.  Clance Boll 06/07/2011,10:03 AM

## 2011-06-08 LAB — GLUCOSE, CAPILLARY
Glucose-Capillary: 123 mg/dL — ABNORMAL HIGH (ref 70–99)
Glucose-Capillary: 114 mg/dL — ABNORMAL HIGH (ref 70–99)

## 2011-06-08 MED ORDER — CLINIMIX E/DEXTROSE (5/20) 5 % IV SOLN
INTRAVENOUS | Status: AC
  Administered 2011-06-08: 17:00:00 via INTRAVENOUS
  Filled 2011-06-08: qty 2000

## 2011-06-08 MED ORDER — FAT EMULSION 20 % IV EMUL
250.0000 mL | INTRAVENOUS | Status: AC
  Administered 2011-06-08: 250 mL via INTRAVENOUS
  Filled 2011-06-08: qty 250

## 2011-06-08 NOTE — Progress Notes (Signed)
PARENTERAL NUTRITION CONSULT NOTE - FOLLOW UP  Pharmacy Consult for TNA  Indication: s/p bowel resection with post-op ileus with ostomy and inadequate oral intake in setting of Stage III rectal CA  No Known Allergies  Patient Measurements: Height: 5' (152.4 cm) (Entered during cutover) Weight: 254 lb 15.7 oz (115.66 kg) (Entered during cutover) IBW/kg (Calculated) : 45.5  Adjusted Body Weight: 66 kg  Vital Signs: Temp: 98.7 F (37.1 C) (11/06 0843) Temp src: Oral (11/06 0843) BP: 105/69 mmHg (11/06 0843) Pulse Rate: 103  (11/06 0843) Intake/Output from previous day: 11/05 0701 - 11/06 0700 In: 3216 [P.O.:880; I.V.:711; IV Piggyback:10; TPN:1615] Out: 2680 [Urine:2050; Drains:30; Stool:600] Intake/Output from this shift:    Labs:  [facility] 06/07/11 0433  WBC 10.5  HGB 8.1*  HCT 26.4*  PLT 465*  APTT --  INR --     Basename 06/07/11 0434 06/07/11 0433 06/06/11 0423  NA -- 137 137  K -- 3.9 4.1  CL -- 101 102  CO2 -- 27 27  GLUCOSE -- 127* 130*  BUN -- 11 11  CREATININE -- 0.80 0.75  LABCREA -- -- --  CREAT24HRUR -- -- --  CALCIUM -- 9.3 9.1  MG -- 2.1 --  PHOS -- 4.6 --  PROT -- 6.0 --  ALBUMIN -- 2.3* --  AST -- 34 --  ALT -- 89* --  ALKPHOS -- 90 --  BILITOT -- 0.3 --  BILIDIR -- -- --  IBILI -- -- --  PREALBUMIN -- 12.5* --  CHOLHDL -- -- --  CHOL 132 -- --   Estimated Creatinine Clearance: 88 ml/min (by C-G formula based on Cr of 0.8).    Basename 06/08/11 0605 06/08/11 0011 06/07/11 1820  GLUCAP 123* 128* 131*    Medications:  Scheduled:    . antiseptic oral rinse  15 mL Mouth Rinse q12n4p  . chlorhexidine  15 mL Mouth/Throat BID  . enoxaparin (LOVENOX) injection  40 mg Subcutaneous Daily  . feeding supplement  1 Container Oral BID BM  . insulin aspart  0-9 Units Subcutaneous Q6H  . pantoprazole (PROTONIX) IV  40 mg Intravenous QHS  . sodium chloride  10 mL Intravenous Q12H    Insulin Requirements in the past 24 hours:  8 units of  SSI recorded in past 24 hours - only two CBGs recorded - 130 and 127  Current Nutrition:  Low fiber diet started this AM. Oral intake recorded at 880 ml last night - diet advancing slowly.  Assessment: 60YOF tolerating TNA at goal nutrition rate of 37ml/hr TNA with 63ml/hr lipids.   Nutrition: Continuing TNA at goal rates (for 100% nutritional needs) for now until adequate oral intake achieved. Slowly advancing diet at this time. Receiving Ensure pudding 2x/day. Still minor output from drains.   Lytes: previously wnl  CBGs: controlled (<197ml/dL) on SSI. Continue SSI.   Nutritional Goals:  70-90 kCal, 1600-1800 grams of protein per day  Plan:  Continue Clinimix E 5/20 at 35ml/hr and lipids at 64ml/hr.  F/u plan to begin weaning TNA once oral intake improved.   Clance Boll 06/08/2011,9:07 AM

## 2011-06-08 NOTE — Progress Notes (Signed)
  Subjective: Patient is doing reasonably well. She is tolerating a full liquid diet. She feels a little bit more hungry. She is ambulating in the halls. Pain control is reasonably good. The colostomy is working. Nursing and enterostomal therapy are working with the patient.  Objective: Vital signs in last 24 hours: Temp:  [98 F (36.7 C)-99.7 F (37.6 C)] 99.5 F (37.5 C) (11/06 0606) Pulse Rate:  [96-110] 102  (11/06 0606) Resp:  [16-20] 20  (11/06 0606) BP: (107-112)/(63-70) 110/68 mmHg (11/06 0606) SpO2:  [94 %-96 %] 94 % (11/06 0606) Last BM Date: 06/08/11  Intake/Output from previous day: 11/05 0701 - 11/06 0700 In: 3216 [P.O.:880; I.V.:711; IV Piggyback:10; TPN:1615] Out: 2680 [Urine:2050; Drains:30; Stool:600] Intake/Output this shift:    Resp: clear to auscultation bilaterally GI: abdomen is soft, obese, relatively benign. Midline wound and right lower quadrant wound are open with healthy granulation tissue. The stoma appears healthy with stool in the colostomy bag. Jackson-Pratt drainage is a little bit murky but low volume. No signs of any infection of the abdominal wall soft tissues. Extremities: extremities normal, atraumatic, no cyanosis or edema  Lab Results:   [facility] 06/07/11 0433  WBC 10.5  HGB 8.1*  HCT 26.4*  PLT 465*   BMET  Basename 06/07/11 0433 06/06/11 0423  NA 137 137  K 3.9 4.1  CL 101 102  CO2 27 27  GLUCOSE 127* 130*  BUN 11 11  CREATININE 0.80 0.75  CALCIUM 9.3 9.1   PT/INR No results found for this basename: LABPROT:2,INR:2 in the last 72 hours ABG No results found for this basename: PHART:2,PCO2:2,PO2:2,HCO3:2 in the last 72 hours  Studies/Results: No results found.  Anti-infectives: Anti-infectives    None      Assessment/Plan:  patient is stable and making slow but steady progress.  We will slowly advance her diet to a low residue diet.  Continue TNA.  Continue ambulation.  Anticipate discharge in 2-3 days with  home health nursing.  Prior to discharge, I will need to assess the loop colostomy to see if there is still a bridge present.  LOS: 20 days    Loukisha Gunnerson M 06/08/2011

## 2011-06-08 NOTE — Progress Notes (Signed)
Nutrition Follow Up Note  Diet: Low residue, intake 15% of meals, also on Ensure pudding, ate 1 last night, planning to eat 1 today after lunch  TNA: Clinimix 5/20 at 85ml/hr with fat emulsions at 27ml/hr - provides 1705 calories, 70g protein, meeting 100% of nutritional needs  Scheduled Meds:   . antiseptic oral rinse  15 mL Mouth Rinse q12n4p  . chlorhexidine  15 mL Mouth/Throat BID  . enoxaparin (LOVENOX) injection  40 mg Subcutaneous Daily  . feeding supplement  1 Container Oral BID BM  . insulin aspart  0-9 Units Subcutaneous Q6H  . pantoprazole (PROTONIX) IV  40 mg Intravenous QHS  . sodium chloride  10 mL Intravenous Q12H   Continuous Infusions:   . sodium chloride 32 mL/hr at 06/08/11 0600  . TPN (CLINIMIX) +/- additives 60 mL/hr at 06/06/11 1738   And  . fat emulsion 250 mL (06/06/11 1737)  . TPN (CLINIMIX) +/- additives 60 mL/hr at 06/07/11 1822   And  . fat emulsion 250 mL (06/07/11 1821)  . TPN (CLINIMIX) +/- additives     And  . fat emulsion     PRN Meds:.acetaminophen, diphenhydrAMINE, diphenhydrAMINE, morphine injection, naloxone, ondansetron (ZOFRAN) IV, ondansetron (ZOFRAN) IV, oxyCODONE-acetaminophen, phenol, sodium chloride, sodium chloride  CBG (last 3)   Basename 06/08/11 1231 06/08/11 0605 06/08/11 0011  GLUCAP 114* 123* 128*   CMP     Component Value Date/Time   NA 137 06/07/2011 0433   K 3.9 06/07/2011 0433   CL 101 06/07/2011 0433   CO2 27 06/07/2011 0433   GLUCOSE 127* 06/07/2011 0433   BUN 11 06/07/2011 0433   CREATININE 0.80 06/07/2011 0433   CREATININE 0.98 03/15/2011 0828   CALCIUM 9.3 06/07/2011 0433   PROT 6.0 06/07/2011 0433   ALBUMIN 2.3* 06/07/2011 0433   AST 34 06/07/2011 0433   ALT 89* 06/07/2011 0433   ALKPHOS 90 06/07/2011 0433   BILITOT 0.3 06/07/2011 0433   GFRNONAA 79* 06/07/2011 0433   GFRAA >90 06/07/2011 0433   Noted Alb 2.3 is up from 1.9 on 10/29    Intake/Output Summary (Last 24 hours) at 06/08/11 1338 Last data filed at  06/08/11 1235  Gross per 24 hour  Intake   3326 ml  Output   2730 ml  Net    596 ml   JP #1 drain - 0ml x 13 hours, JP# 2 drain - 10ml x 13 hours, moderate ostomy output per RN, no ml documented yet  Nutrition Diagnosis - Inadequate oral intake - slowly improving  Goal - TNA to meet 100% of nutritional needs - met  New Goal - Pt to consume >75% of meals.   Plan - Encouraged gradual increased intake. Educated pt on low residue diet and provided handout on fiber content of foods. Pt expressed understanding. TNA per pharmacy. Will continue to monitor.   Dietitian # [phone]

## 2011-06-09 MED ORDER — MORPHINE SULFATE 4 MG/ML IJ SOLN
INTRAMUSCULAR | Status: AC
  Administered 2011-06-09: 2 mg via INTRAVENOUS
  Filled 2011-06-09: qty 1

## 2011-06-09 NOTE — Progress Notes (Signed)
CM spoke with pt.  Has used [facility] in past for home health needs and prefers to use them again.  Please see [facility] note in shadow chart.  Kathi Der RNC-MNN, BSN, [phone]

## 2011-06-09 NOTE — Consult Note (Signed)
WOC ostomy consult  Stoma type/location: Midline transverse colostomy located at apex of open wound Stomal assessment/size: 2.25 x 1.75 inches, flush with skin in a mildly depressed area of tissue.  NO BRIDGE NOTED. Peristomal assessment: peristomal skin clear. Ostomy pouching: 1pc. Karaya pouch with barrier ring placed prior to pouch.  Holds approximately 4-5 days without leakage. Education provided:  Daughter, a previous CNA, observed pouch change on Sunday.  Patient is able to empty pouch and re-apply clamp (I have observed her return demonstration of this skill), but nursing staff has been emptying pouch for her.  I will write orders today that she become independent in this activity.

## 2011-06-09 NOTE — Progress Notes (Signed)
Physical Therapy Treatment Patient Details Name: SHATINA STREETS MRN: [id number] DOB: October 24, 1950 Today's Date: 06/09/2011 Time: 4098-1191   2G PT Assessment/Plan  PT - Assessment/Plan Comments on Treatment Session: Pt still having difficulty with bed mobility-unable to perform bed mobillty without use of rail or HOB elevated. Recommend hospital bed for home if possible. Discussed with patient. PT Plan: Discharge plan remains appropriate;Equipment recommendations need to be updated Equipment Recommended:  (Pt will need hospital bed, if possible) PT Goals  Acute Rehab PT Goals PT Goal: Supine/Side to Sit - Progress: Progressing toward goal PT Goal: Up/Down Stairs - Progress: Progressing toward goal  PT Treatment Precautions/Restrictions  Restrictions Weight Bearing Restrictions: No Mobility (including Balance) Bed Mobility Bed Mobility: Yes Supine to Sit: HOB elevated (Comment degrees);6: Modified independent (Device/Increase time) Supine to Sit Details (indicate cue type and reason): HOB 70-80 degrees.Use of rail required. Increased time. Sitting - Scoot to Edge of Bed: 6: Modified independent (Device/Increase time) Sit to Supine - Right: 6: Modified independent (Device/Increase time) Sit to Supine - Right Details (indicate cue type and reason): HOB 70-80 degrees. Use of rail required. Attempted to practice with bed lowered to nearly flat position, but pt returned HOB to elevated position. Increased time. Transfers Transfers: Yes Sit to Stand: 6: Modified independent (Device/Increase time);With upper extremity assist;From chair/3-in-1 Stand to Sit: 6: Modified independent (Device/Increase time);With upper extremity assist;To bed;To chair/3-in-1 Ambulation/Gait Ambulation/Gait Assistance: 5: Supervision Ambulation/Gait Assistance Details (indicate cue type and reason): With pt pushing IV pole. Slow steady pace.  Ambulation Distance (Feet): 400 Feet Assistive device:  (Pt pushing IV  pole) Stairs: Yes Stairs Assistance Details (indicate cue type and reason): MIn-guard assist. Pt used 2 hands on 1 rail to ascend/descend. Stair Management Technique: One rail Left;Step to pattern;Sideways Number of Stairs: 3  (Limited # due to IV pole.)    Exercise    End of Session PT - End of Session Activity Tolerance: Patient tolerated treatment well Patient left: in bed;with call bell in reach General Behavior During Session: [facility] for tasks performed Cognition: [facility] for tasks performed  Rebeca Alert [facility] 06/09/2011, 2:20 PM

## 2011-06-09 NOTE — Progress Notes (Signed)
  Subjective: The patient is feeling reasonably well. She has ambulated without a walker. She says she is eating the low residue diet reasonably well. The colostomy is working. There has not been a visit from the ostomy nurse to check on the bridge yet. She takes Percocet for pain and that seems to control her pain reasonably well but she is still requiring this from time to time. She is voiding uneventfully.  Objective: Vital signs in last 24 hours: Temp:  [98.1 F (36.7 C)-99.5 F (37.5 C)] 98.4 F (36.9 C) (11/06 2100) Pulse Rate:  [102-113] 104  (11/06 2100) Resp:  [18-20] 18  (11/06 2100) BP: (105-126)/(64-71) 126/71 mmHg (11/06 2100) SpO2:  [94 %-98 %] 98 % (11/06 2100) Last BM Date: 06/09/11  Intake/Output from previous day: 11/06 0701 - 11/07 0700 In: 760 [P.O.:760] Out: 3270 [Urine:2600; Drains:20; Stool:650] Intake/Output this shift: Total I/O In: 240 [P.O.:240] Out: 2320 [Urine:1650; Drains:20; Stool:650]  Resp: clear to auscultation bilaterally GI: abdomen is soft, obese, mild diffuse tenderness. Midline wound and right lower quadrant wound open and packed with healthy granulation tissue. No purulence. Ostomy is working with good output. I cannot feel a bridge under the loop at this point in time.  Lab Results:   [facility] 06/07/11 0433  WBC 10.5  HGB 8.1*  HCT 26.4*  PLT 465*   BMET  Basename 06/07/11 0433  NA 137  K 3.9  CL 101  CO2 27  GLUCOSE 127*  BUN 11  CREATININE 0.80  CALCIUM 9.3   PT/INR No results found for this basename: LABPROT:2,INR:2 in the last 72 hours ABG No results found for this basename: PHART:2,PCO2:2,PO2:2,HCO3:2 in the last 72 hours  Studies/Results: No results found.  Anti-infectives: Anti-infectives    None      Assessment/Plan: s/p  Advance diet Discontinue TNA after current bags infused. Case management consult to begin to plan for home needs. Home health nursing consult to begin to plan for wound and ostomy  care at home. Discharge home in 1-3 days.  LOS: 21 days    Johathan Province M 06/09/2011

## 2011-06-10 ENCOUNTER — Encounter (HOSPITAL_COMMUNITY): Payer: Self-pay | Admitting: Physician Assistant

## 2011-06-10 MED ORDER — PANTOPRAZOLE SODIUM 40 MG PO TBEC
40.0000 mg | DELAYED_RELEASE_TABLET | Freq: Every day | ORAL | Status: DC
  Administered 2011-06-10 – 2011-06-11 (×2): 40 mg via ORAL
  Filled 2011-06-10 (×3): qty 1

## 2011-06-10 NOTE — Plan of Care (Signed)
Problem: Phase III Progression Outcomes Goal: Stoma site, output assessed Outcome: Completed/Met Date Met:  06/10/11 Pt producing soft stool from colostomy, stoma pink

## 2011-06-10 NOTE — Progress Notes (Signed)
The patient is receiving Protonix by the intravenous route.  Based on criteria approved by the Pharmacy and Therapeutics Committee and the Medical Executive Committee, the medication is being converted to the equivalent oral dose form.  These criteria include: -No Active GI bleeding -Able to tolerate diet of full liquids (or better) or tube feeding -Able to tolerate other medications by the oral or enteral route  If you have any questions about this conversion, please contact the Pharmacy Department (phone [phone]).  Thank you.  Electa Sniff K 06/10/2011 8:00 AM

## 2011-06-10 NOTE — Progress Notes (Signed)
  Subjective: Patient is slowly improving. She is now eating almost half of the food on her tray. She still has a lot of pain and is taking Percocet regularly. She started to climb some stairs with physical therapy. She still is fatigued. The colostomy is working. The colostomy has been evaluated and there is not a bridge to be removed.  Objective: Vital signs in last 24 hours: Temp:  [98 F (36.7 C)-99.4 F (37.4 C)] 98.4 F (36.9 C) (11/08 0545) Pulse Rate:  [105-118] 112  (11/08 0545) Resp:  [18-22] 18  (11/08 0545) BP: (101-137)/(68-80) 101/68 mmHg (11/08 0545) SpO2:  [93 %-97 %] 94 % (11/08 0545) Last BM Date: 06/09/11  Intake/Output from previous day: 11/07 0701 - 11/08 0700 In: 1527 [P.O.:360; I.V.:365; TPN:802] Out: 1005 [Urine:1000; Drains:5] Intake/Output this shift: Total I/O In: -  Out: 5 [Drains:5]  Resp: clear to auscultation bilaterally GI: abdomen is obese. Relatively soft. There is some diffuse tenderness. Not distended. The wounds are clean with healthy granulation tissue. The colostomy is healthy. There is minimal depression at the colostomy site. There is good ostomy output in the bag.  Lab Results:  No results found for this basename: WBC:2,HGB:2,HCT:2,PLT:2 in the last 72 hours BMET No results found for this basename: NA:2,K:2,CL:2,CO2:2,GLUCOSE:2,BUN:2,CREATININE:2,CALCIUM:2 in the last 72 hours PT/INR No results found for this basename: LABPROT:2,INR:2 in the last 72 hours ABG No results found for this basename: PHART:2,PCO2:2,PO2:2,HCO3:2 in the last 72 hours  Studies/Results: No results found.  Anti-infectives: Anti-infectives    None      Assessment/Plan: s/p  Advance diet Continue ostomy education.  Continue physical therapy.  Home health nursing requested for wound and ostomy care.  Plan discharge tomorrow.  LOS: 22 days    Arvella Massingale M 06/10/2011

## 2011-06-11 ENCOUNTER — Telehealth (INDEPENDENT_AMBULATORY_CARE_PROVIDER_SITE_OTHER): Payer: Self-pay

## 2011-06-11 MED ORDER — OXYCODONE-ACETAMINOPHEN 5-325 MG PO TABS: 1.0000 | ORAL_TABLET | ORAL | Status: DC | PRN

## 2011-06-11 NOTE — Progress Notes (Signed)
     Subjective: No problems. She is tolerating diet, no n/v, ambulating, would like to go home if possible  Objective: Vital signs in last 24 hours: Temp:  [98.2 F (36.8 C)-99.4 F (37.4 C)] 98.8 F (37.1 C) (11/09 0600) Pulse Rate:  [100-106] 100  (11/09 0600) Resp:  [20] 20  (11/09 0600) BP: (106-124)/(69-80) 124/80 mmHg (11/09 0600) SpO2:  [93 %-97 %] 97 % (11/09 0600) Last BM Date: 06/10/11  Intake/Output from previous day: 11/08 0701 - 11/09 0700 In: 1284 [P.O.:900; I.V.:384] Out: 2335 [Urine:1925; Drains:10; Stool:400] Intake/Output this shift: Total I/O In: 624 [P.O.:240; I.V.:384] Out: 985 [Urine:575; Drains:10; Stool:400]  Resp: clear to auscultation bilaterally Cardio: regular rate and rhythm GI: soft, wounds all pink with granulation tissue present, jp with serous fluid, ostomy functional  Lab Results:  No results found for this basename: WBC:2,HGB:2,HCT:2,PLT:2 in the last 72 hours BMET No results found for this basename: NA:2,K:2,CL:2,CO2:2,GLUCOSE:2,BUN:2,CREATININE:2,CALCIUM:2 in the last 72 hours PT/INR No results found for this basename: LABPROT:2,INR:2 in the last 72 hours ABG No results found for this basename: PHART:2,PCO2:2,PO2:2,HCO3:2 in the last 72 hours  Studies/Results: No results found.  Anti-infectives: Anti-infectives    None      Assessment/Plan: s/p  Discharge today if home health set up for daily wet to dry dressing changes along with ostomy and drain care   LOS: 23 days    [facility] 06/11/2011

## 2011-06-11 NOTE — Progress Notes (Signed)
Physical Therapy Treatment Patient Details Name: Jasmine Clayton MRN: [id number] DOB: 1950-11-16 Today's Date: 06/11/2011 Time: 4098-1191 Charge: Leonia Reeves PT Assessment/Plan  PT - Assessment/Plan Comments on Treatment Session: Pt modified Independent with hospital bed and reports she will be receiving one at home prior to D/C.  She was able to ambulate without UE assist today and performed stairs at supervision. Encouraged pt to ambulate hallways with nursing staff.  Pt reports she is ready for D/C and agreed to D/C from acute PT as all goals were met. PT Plan: All goals met and education completed, patient dischaged from PT services Follow Up Recommendations: Home health PT Equipment Recommended:  (Pt reports she will be receiving hospital bed.) PT Goals  Acute Rehab PT Goals PT Goal: Supine/Side to Sit - Progress: Discontinued (comment) (Pt to receive hospital bed and mod I with bed mobility today) PT Transfer Goal: Sit to Stand/Stand to Sit - Progress: Met PT Goal: Ambulate - Progress: Met PT Goal: Up/Down Stairs - Progress: Met (limited number 2* IV pole but able to perform 9 total)  PT Treatment Precautions/Restrictions  Restrictions Weight Bearing Restrictions: No Mobility (including Balance) Bed Mobility Bed Mobility: Yes Supine to Sit: 6: Modified independent (Device/Increase time);HOB elevated (Comment degrees) (HOB elevated 40*, use of rail, increased time) Transfers Transfers: Yes Sit to Stand: 6: Modified independent (Device/Increase time);With upper extremity assist;From bed Stand to Sit: 6: Modified independent (Device/Increase time);With upper extremity assist;To chair/3-in-1 Ambulation/Gait Ambulation/Gait: Yes Ambulation/Gait Assistance: 5: Supervision Ambulation/Gait Assistance Details (indicate cue type and reason): Pt able to ambulate without UE assist today (ie no equipment) and PT pushed IV pole, slow cadence Ambulation Distance (Feet): 360 Feet Assistive device:  None Gait Pattern: Decreased stride length;Decreased trunk rotation Stairs: Yes Stairs Assistance: 5: Supervision Stairs Assistance Details (indicate cue type and reason): Pt able to perform stairs at supervision level.  Performed 4 stairs and then 5 stairs. Stair Management Technique: One rail Left;Sideways;Step to pattern Number of Stairs: 5  (4 stairs and then 5 stairs. limited by IV pole)    Exercise    End of Session PT - End of Session Activity Tolerance: Patient tolerated treatment well Patient left: in chair;with call bell in reach General Behavior During Session: [facility] for tasks performed Cognition: [facility] for tasks performed  [facility] 06/11/2011, 1:11 PM

## 2011-06-11 NOTE — Progress Notes (Signed)
Pt given printed info for low fat, low salt and high protein diet.

## 2011-06-11 NOTE — Telephone Encounter (Signed)
LMOM for pt's daughter to call me so I can give her f/u appt/ [facility]

## 2011-06-11 NOTE — Progress Notes (Signed)
CM spoke with pt to confirm [facility] arrangements.  See [facility] note in shadow chart.  Kathi Der RNC-MNN, BSN

## 2011-06-15 ENCOUNTER — Telehealth: Payer: Self-pay | Admitting: Oncology

## 2011-06-15 NOTE — Telephone Encounter (Signed)
S/w the pt's dtr and she is aware of the new pt appts with dr Truett Perna on 06/18/2011@10 :00am

## 2011-06-17 ENCOUNTER — Encounter (INDEPENDENT_AMBULATORY_CARE_PROVIDER_SITE_OTHER): Admitting: General Surgery

## 2011-06-18 ENCOUNTER — Other Ambulatory Visit: Admitting: Lab

## 2011-06-18 ENCOUNTER — Ambulatory Visit

## 2011-06-18 ENCOUNTER — Ambulatory Visit (HOSPITAL_BASED_OUTPATIENT_CLINIC_OR_DEPARTMENT_OTHER): Admitting: Nurse Practitioner

## 2011-06-18 ENCOUNTER — Other Ambulatory Visit: Payer: Self-pay | Admitting: Nurse Practitioner

## 2011-06-18 ENCOUNTER — Ambulatory Visit (INDEPENDENT_AMBULATORY_CARE_PROVIDER_SITE_OTHER): Admitting: General Surgery

## 2011-06-18 ENCOUNTER — Telehealth (INDEPENDENT_AMBULATORY_CARE_PROVIDER_SITE_OTHER): Payer: Self-pay

## 2011-06-18 ENCOUNTER — Encounter: Payer: Self-pay | Admitting: Nurse Practitioner

## 2011-06-18 ENCOUNTER — Encounter (INDEPENDENT_AMBULATORY_CARE_PROVIDER_SITE_OTHER): Payer: Self-pay | Admitting: General Surgery

## 2011-06-18 VITALS — Temp 97.6°F | Ht 60.0 in | Wt 241.0 lb

## 2011-06-18 VITALS — BP 128/82 | HR 78 | Temp 98.4°F | Ht 60.0 in | Wt 239.0 lb

## 2011-06-18 DIAGNOSIS — C20 Malignant neoplasm of rectum: Secondary | ICD-10-CM

## 2011-06-18 DIAGNOSIS — Z09 Encounter for follow-up examination after completed treatment for conditions other than malignant neoplasm: Secondary | ICD-10-CM

## 2011-06-18 DIAGNOSIS — IMO0002 Reserved for concepts with insufficient information to code with codable children: Secondary | ICD-10-CM

## 2011-06-18 MED ORDER — OXYCODONE-ACETAMINOPHEN 5-325 MG PO TABS: 1.0000 | ORAL_TABLET | ORAL | Status: AC | PRN

## 2011-06-18 NOTE — Progress Notes (Signed)
Subjective:     Patient ID: Jasmine Clayton, female   DOB: September 11, 1950, 60 y.o.   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  She has been home for a week or so now and is doing well.  She has no complaints.  Denies fevers, eating well.   Review of Systems     Objective:   Physical Exam rlq stoma wound clean and healing with pink granulation tissue, midline wound clean without infection Stoma functional JP with minimal output but murky    Assessment:     Rectal cancer stage III s/p LAR    Plan:     Continue dressing changes Will leave drain in place for now, I am concerned she has small leakage from not a 100% diversion Will see in 2 weeks She is due to see Dr. Truett Perna today I am pleased with her progress given a very difficult operative course

## 2011-06-18 NOTE — Progress Notes (Signed)
Reason for Referral: Recently diagnosed rectal cancer.    HPI: Jasmine Clayton is a 60 year old woman recently diagnosed with rectal cancer after presenting with a one to two-year history of rectal bleeding. She underwent a colonoscopy on 04/09/2011 by Dr. Russella Dar with findings of a friable mass at the proximal rectum. Pathology showed invasive moderately differentiated adenocarcinoma arising in a large tubulovillous adenoma extending to the cauterized resection margin. Endoscopic ultrasound by Dr. Christella Hartigan on 04/30/2011 showed a 2 cm soft, non-circumferential, mobile (uT1N0) mass in the proximal to mid rectum with the distal edge 8 cm from the anal verge.  On 05/19/2011 she was taken to the operating room by Dr. Dwain Sarna and underwent diagnostic laparoscopy with lysis of adhesions followed by an open low anterior resection with primary anastomosis at approximately 5 cm and diverting ileostomy. Pathology showed an invasive moderately differentiated adenocarcinoma arising in a large tubulovillous adenoma invading into the submucosa. There was no perineural or lymphovascular invasion. One of 9 lymph nodes were positive for metastatic adenocarcinoma. The resection margin was clear.  She was taken back to the OR on 05/27/2011 due to necrosis of the stoma and underwent ileocecectomy with transverse colostomy. Pathology on the ileostomy, cecum and appendix showed necrosis associated with perforation, inflamed granulation tissue and inflamed serosal fibrous adhesions. There was no evidence of malignancy. She was discharged home on 06/11/2011.  Of note, a preoperative CEA on 05/14/2011 was in normal range at 1.0.     Past Medical History  Diagnosis Date  . DVT (deep vein thrombosis) in pregnancy 1979    during pregnancy. lower extremity  . Stress incontinence, female   . Morbid obesity   . Degenerative joint disease   . Rectal Cancer 04/09/2011  . 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 Total abdominal hysterectomy w/ bilateral salpingoophorectomy 11/30/09  . Total hip arthroplasty 2002    left hip  . Mass excision 2011    "mass removed from ovaries"  . Cesarean section 1980  . Breast cyst excision 1972  . Keloid excision 1982  . Joint replacement 2002    hip replacement left          :  Current outpatient prescriptions:aspirin 81 MG tablet, Take 81 mg by mouth daily.  , Disp: , Rfl: ;  B Complex Vitamins (B-COMPLEX/B-12 PO), Take 1 tablet by mouth daily.  , Disp: , Rfl: ;  calcium-vitamin D (OSCAL) 250-125 MG-UNIT per tablet, Take 1 tablet by mouth daily. , Disp: , Rfl: ;  fish oil-omega-3 fatty acids 1000 MG capsule, Take 1 g by mouth daily.  , Disp: , Rfl:  omeprazole (PRILOSEC OTC) 20 MG tablet, Take 20 mg by mouth daily. , Disp: , Rfl: ;  oxyCODONE-acetaminophen (PERCOCET) 5-325 MG per tablet, Take 1-2 tablets by mouth every 4 (four) hours as needed., Disp: 40 tablet, Rfl: 0;  tetrahydrozoline 0.05 % ophthalmic solution, Place 1 drop into both eyes 2 (two) times daily as needed. For dry/irritated eyes , Disp: , Rfl: :  No Known Allergies:  Family History  Problem Relation Age of Onset  . Cancer Mother 45s    endometrial cancer  . Diabetes Father   . Colon cancer Sister 79  :     Social History  . Marital Status: Married         Number of Children: 1   Occupational History  .  unemployed        Social History Main Topics  . Smoking status: Never Smoker   . Smokeless tobacco: Never Used  . Alcohol Use: 3.5 oz/week    7 drink(s) per week  . Drug Use: No  .                    Review of systems-Jasmine Clayton reports a good appetite. No nausea or vomiting. The colostomy is functioning. She is having periodic abdominal pain. No hematuria. She notes slight dysuria with urination. The dysuria decreases if she increases  water intake. No fevers or sweats. She  denies any unusual headaches or vision change. No shortness of breath or cough. No chest pain. She denies leg swelling. The abdominal wound is being packed.   Physical exam: Temperature 98.4, heart rate 78, respirations 20, blood pressure 128/82, weight 239 pounds, height 5 feet   HEENT-normocephalic, atraumatic. Pupils equal and reactive to light. Extraocular movements are intact. Sclera anicteric. Oropharynx is without thrush or ulceration. Lymph-no palpable cervical, supraclavicular, axillary or inguinal lymph nodes. Chest-lungs clear bilaterally. No wheezes or rales. Cardiovascular-regular rate and rhythm. Abdomen-transverse colostomy with soft stool in the collection bag.Midline wound with packing.Wound at right abdomen also with packing. Extremity-no edema. Neuro-alert and oriented. Motor strength 5 over 5.    Dg Abd 2 Views  05/26/2011  *RADIOLOGY REPORT*  Clinical Data: Abdominal pain  ABDOMEN - 2 VIEW  Comparison: 04/14/2011  Findings: There is scattered large and small bowel gas identified. A right lower quadrant ostomy is seen.  Postsurgical changes with surgical drains in the pelvis are noted.  The prior left hip prosthesis is noted.  No free air is seen.  Mild bibasilar atelectasis is noted.  IMPRESSION: Postsurgical changes.  No acute abdominal abnormality is noted.  Original Report Authenticated By: Phillips Odor, M.D.    Assessment and Plan: #1-Stage IIIa rectal cancer- status post colonoscopy on 04/09/2011 by Dr. Russella Dar with findings of a friable mass at the proximal rectum. Pathology showed invasive moderately differentiated adenocarcinoma arising in a large tubulovillous adenoma extending to the cauterized resection margin. Endoscopic ultrasound by Dr. Christella Hartigan on 04/30/2011 showed a 2 cm soft, non-circumferential, mobile (uT1N0) mass in the proximal to mid rectum with the distal edge 8 cm from the anal verge. On 05/19/2011 she was taken to the operating room by Dr. Dwain Sarna and  underwent diagnostic laparoscopy with lysis of adhesions followed by an open low anterior resection with primary anastomosis at approximately 5 cm and diverting ileostomy. Pathology showed an invasive moderately differentiated adenocarcinoma arising in a large tubulovillous adenoma invading into the submucosa. There was no perineural or lymphovascular invasion. One of 9 lymph nodes were positive for metastatic adenocarcinoma. The resection margin was clear.  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 Pathology on the ileostomy, cecum and appendix showed necrosis associated with perforation, inflamed granulation tissue and inflamed serosal fibrous adhesions. There was no evidence of malignancy.   #3-Abdominal wounds-the wounds are being packed.  #4-History of rectal bleeding secondary to #1.  #5-History of DVT while pregnant, 1979.  #6-Normal preoperative CEA.  #7-Positive family history of cancer including endometrial cancer in her mother and colon cancer in her sister.    Jasmine Clayton has been diagnosed with a stage IIIa rectal cancer. She continues to recover from surgery. Dr. Truett Perna discussed the diagnosis, prognosis and treatment options with Jasmine Clayton and her daughter. Dr. Truett Perna recommends adjuvant chemotherapy and radiation therapy with  3 to 4 cycles of CAPOX chemotherapy initially followed by radiation and concurrent Xeloda and then additional CAPOX. If the abdominal wounds continue to heal we will plan to begin the chemotherapy in the next 2-3 weeks. We are referring her for a chemotherapy education class. We are also referring her back to Dr. Dwain Sarna for Port-A-Cath placement. Due to her family history of cancer we are requesting microsatellite instability testing on the tumor.  Jasmine Clayton will return for a followup visit with Dr. Truett Perna on 06/28/2011. She will contact the office in the interim with any problems.  Patient  interviewed and examined by Dr. Truett Perna; plan per Dr. Truett Perna.    Lonna Cobb, ANP/GNP-BC

## 2011-06-18 NOTE — Telephone Encounter (Signed)
Called [facility] to speak with the manager Kriste Basque about the pt's home health services b/c the pt is concerned about the ostomy bag not being changed since 06-13-11. Also, the pt has no ostomy supplies and the ostomy was leaking yesterday into the open abdominal wound but the ostomy was not changed or the dressing. The daughter has been doing the dressing changes and the wound looks good but the daughter is not happy with home health she requested to switch services. I called [facility] but they do not accept pt's insurance. I gave Kriste Basque all this info and she said she would take care of the pt today she was going to call Amber the home health nurse to see what was going on with situation. I advised Kriste Basque that the pt was going to her oncology appt now at [facility] but to call the daughter Michelle./ [facility]

## 2011-06-18 NOTE — Telephone Encounter (Signed)
The daughter returned my call and I notified her of the conversation I had with the manager Becky with [facility]. I advised the daughter that Kriste Basque said she would look into this today and speak with the home health nurse Amber of the issues. The daughter understood the info.Jasmine Clayton

## 2011-06-18 NOTE — Telephone Encounter (Signed)
LMOM for daughter to call me so I could explain the home health issue.Jasmine Clayton

## 2011-06-22 ENCOUNTER — Telehealth (INDEPENDENT_AMBULATORY_CARE_PROVIDER_SITE_OTHER): Payer: Self-pay

## 2011-06-22 ENCOUNTER — Telehealth (INDEPENDENT_AMBULATORY_CARE_PROVIDER_SITE_OTHER): Payer: Self-pay | Admitting: General Surgery

## 2011-06-22 NOTE — Telephone Encounter (Addendum)
Home health nurse called stating during dressing change she saw 2 staples still intact, Home Health will go ahead and remove staples.

## 2011-06-23 ENCOUNTER — Ambulatory Visit: Admitting: Nurse Practitioner

## 2011-06-24 ENCOUNTER — Other Ambulatory Visit: Payer: Self-pay | Admitting: Oncology

## 2011-06-28 ENCOUNTER — Other Ambulatory Visit: Payer: Self-pay | Admitting: Nurse Practitioner

## 2011-06-28 ENCOUNTER — Ambulatory Visit (HOSPITAL_BASED_OUTPATIENT_CLINIC_OR_DEPARTMENT_OTHER)

## 2011-06-28 ENCOUNTER — Other Ambulatory Visit (INDEPENDENT_AMBULATORY_CARE_PROVIDER_SITE_OTHER): Payer: Self-pay | Admitting: General Surgery

## 2011-06-28 ENCOUNTER — Encounter: Payer: Self-pay | Admitting: Radiation Oncology

## 2011-06-28 ENCOUNTER — Telehealth (INDEPENDENT_AMBULATORY_CARE_PROVIDER_SITE_OTHER): Payer: Self-pay | Admitting: General Surgery

## 2011-06-28 ENCOUNTER — Telehealth (INDEPENDENT_AMBULATORY_CARE_PROVIDER_SITE_OTHER): Payer: Self-pay

## 2011-06-28 ENCOUNTER — Other Ambulatory Visit: Payer: Self-pay | Admitting: *Deleted

## 2011-06-28 ENCOUNTER — Ambulatory Visit (HOSPITAL_BASED_OUTPATIENT_CLINIC_OR_DEPARTMENT_OTHER): Admitting: Oncology

## 2011-06-28 VITALS — BP 141/74 | HR 98 | Temp 97.1°F | Ht 60.0 in | Wt 236.0 lb

## 2011-06-28 DIAGNOSIS — K9402 Colostomy infection: Secondary | ICD-10-CM

## 2011-06-28 DIAGNOSIS — C189 Malignant neoplasm of colon, unspecified: Secondary | ICD-10-CM

## 2011-06-28 DIAGNOSIS — Z809 Family history of malignant neoplasm, unspecified: Secondary | ICD-10-CM

## 2011-06-28 DIAGNOSIS — IMO0002 Reserved for concepts with insufficient information to code with codable children: Secondary | ICD-10-CM

## 2011-06-28 DIAGNOSIS — C20 Malignant neoplasm of rectum: Secondary | ICD-10-CM

## 2011-06-28 LAB — COMPREHENSIVE METABOLIC PANEL
BUN: 11 mg/dL (ref 6–23)
CO2: 26 mEq/L (ref 19–32)
Creatinine, Ser: 0.92 mg/dL (ref 0.50–1.10)
Glucose, Bld: 93 mg/dL (ref 70–99)
Total Bilirubin: 0.5 mg/dL (ref 0.3–1.2)

## 2011-06-28 LAB — CBC WITH DIFFERENTIAL/PLATELET
BASO%: 0.1 % (ref 0.0–2.0)
Basophils Absolute: 0 10*3/uL (ref 0.0–0.1)
EOS%: 2 % (ref 0.0–7.0)
Eosinophils Absolute: 0.1 10*3/uL (ref 0.0–0.5)
HCT: 31.7 % — ABNORMAL LOW (ref 34.8–46.6)
MONO#: 0.7 10*3/uL (ref 0.1–0.9)
MONO%: 9.3 % (ref 0.0–14.0)
NEUT#: 3.9 10*3/uL (ref 1.5–6.5)
Platelets: 314 10*3/uL (ref 145–400)
WBC: 7.1 10*3/uL (ref 3.9–10.3)

## 2011-06-28 MED ORDER — CAPECITABINE 500 MG PO TABS: 2000.0000 mg | ORAL_TABLET | Freq: Two times a day (BID) | ORAL | Status: AC

## 2011-06-28 MED ORDER — OXYCODONE-ACETAMINOPHEN 5-325 MG PO TABS: 1.0000 | ORAL_TABLET | Freq: Four times a day (QID) | ORAL | Status: DC | PRN

## 2011-06-28 NOTE — Telephone Encounter (Signed)
LMOM for pt to p/u Rx at front desk that Dr Dwain Sarna wrote for the pt of Percocet/ [facility]

## 2011-06-28 NOTE — Progress Notes (Signed)
OFFICE PROGRESS NOTE   INTERVAL HISTORY:   Ms. Zuelke returns as scheduled. She has no new complaint. The abdominal wound continues to heal  Objective:  Vital signs in last 24 hours:  Blood pressure 141/74, pulse 98, temperature 97.1 F (36.2 C), temperature source Oral, height 5' (1.524 m), weight 236 lb (107.049 kg), last menstrual period 11/30/2009.   Resp: Lungs clear bilateral Cardio: Regular rate and rhythm GI: Right lower quadrant colostomy with brown stool. There is an open midline wound beneath the colostomy. No evidence for infection. Vascular: No leg edema      Lab Results:  CBC  Lab Results  Component Value Date   WBC 7.1 06/28/2011   HGB 9.9* 06/28/2011   HCT 31.7* 06/28/2011   MCV 81.1 06/28/2011   PLT 314 06/28/2011    Chemistry:      Component Value Date/Time   NA 137 06/07/2011 0433   K 3.9 06/07/2011 0433   CL 101 06/07/2011 0433   CO2 27 06/07/2011 0433   GLUCOSE 127* 06/07/2011 0433   BUN 11 06/07/2011 0433   CREATININE 0.80 06/07/2011 0433   CREATININE 0.98 03/15/2011 0828   CALCIUM 9.3 06/07/2011 0433   PROT 6.0 06/07/2011 0433   ALBUMIN 2.3* 06/07/2011 0433   AST 34 06/07/2011 0433   ALT 89* 06/07/2011 0433   ALKPHOS 90 06/07/2011 0433   BILITOT 0.3 06/07/2011 0433   GFRNONAA 79* 06/07/2011 0433   GFRAA >90 06/07/2011 0433       Medications: I have reviewed the patient's current medications.  Assessment/Plan:  1. Stage IIIa (pT1,pN1) moderately differentiated adenocarcinoma of the rectum arising in a large tubulovillous adenoma, an open low anterior resection 05/19/2011.  2. Necrosis of the stoma, status post an ileocecectomy with transverse colostomy 05/27/2011.  3. Open abdominal wound-the wound is without evidence of infection and appears to be healing.  4. History of a DVT while pregnant in 1979  5. Normal preoperative CEA (1.0 on 05/14/2011)  6. Family history of cancer including endometrial cancer in her mother and colon cancer  in her sister we will submit the primary tumor for microsatellite instability testing.  Disposition:  Ms. Nieland continues to recover from surgery. The abdominal wound is healing. She will attend chemotherapy teaching class within the next one to 2 days. We decided on adjuvant CAPOX chemotherapy when she was here 2 weeks ago. I reviewed the potential toxicities associated with this regimen including a chance for nausea/vomiting, mucositis, diarrhea, and hematologic toxicity. We discussed the neuropathy associated with oxaliplatin. We discussed the skin rash, hyperpigmentation, and hand-foot syndrome associated with capecitabine.  We will ask Dr. Dwain Sarna place a Port-A-Cath for the administration of oxaliplatin. We scheduled a first cycle of chemotherapy for December 11.   Lucile Shutters, MD  06/28/2011  2:14 PM

## 2011-06-28 NOTE — Progress Notes (Signed)
Spoke with Karena Addison in pathology to add microsatellite instability testing to pathology from 05-19-11 per request of Dr. Truett Perna. GD

## 2011-06-28 NOTE — Telephone Encounter (Signed)
Amber with [facility] calling to extend orders for wound care and ostomy teaching. I okayed to extend care until her follow up appt on 07/07/11. Patient is doing well. She will be seen twice a week.

## 2011-06-29 ENCOUNTER — Encounter: Payer: Self-pay | Admitting: Radiation Oncology

## 2011-06-29 ENCOUNTER — Ambulatory Visit
Admission: RE | Admit: 2011-06-29 | Discharge: 2011-06-29 | Disposition: A | Discharge status: Home / Self Care | Source: Ambulatory Visit | Attending: Radiation Oncology | Admitting: Radiation Oncology

## 2011-06-29 ENCOUNTER — Other Ambulatory Visit

## 2011-06-29 ENCOUNTER — Encounter: Payer: Self-pay | Admitting: *Deleted

## 2011-06-29 VITALS — BP 111/65 | HR 98 | Temp 98.4°F | Resp 18 | Ht 60.0 in | Wt 238.0 lb

## 2011-06-29 DIAGNOSIS — Z933 Colostomy status: Secondary | ICD-10-CM | POA: Insufficient documentation

## 2011-06-29 DIAGNOSIS — Z809 Family history of malignant neoplasm, unspecified: Secondary | ICD-10-CM | POA: Insufficient documentation

## 2011-06-29 DIAGNOSIS — Z90710 Acquired absence of both cervix and uterus: Secondary | ICD-10-CM | POA: Insufficient documentation

## 2011-06-29 DIAGNOSIS — C189 Malignant neoplasm of colon, unspecified: Secondary | ICD-10-CM

## 2011-06-29 DIAGNOSIS — Z9089 Acquired absence of other organs: Secondary | ICD-10-CM | POA: Insufficient documentation

## 2011-06-29 DIAGNOSIS — Z85038 Personal history of other malignant neoplasm of large intestine: Secondary | ICD-10-CM | POA: Insufficient documentation

## 2011-06-29 DIAGNOSIS — Z96649 Presence of unspecified artificial hip joint: Secondary | ICD-10-CM | POA: Insufficient documentation

## 2011-06-29 DIAGNOSIS — C20 Malignant neoplasm of rectum: Secondary | ICD-10-CM

## 2011-06-29 DIAGNOSIS — Z86718 Personal history of other venous thrombosis and embolism: Secondary | ICD-10-CM | POA: Insufficient documentation

## 2011-06-29 DIAGNOSIS — Z800 Family history of malignant neoplasm of digestive organs: Secondary | ICD-10-CM | POA: Insufficient documentation

## 2011-06-29 NOTE — Progress Notes (Signed)
TO START XELODA ON 07/13/11.   ONE DAUGHTER, MICHELLE WATTS.

## 2011-06-29 NOTE — Progress Notes (Signed)
[facility] Radiation Oncology NEW PATIENT EVALUATION  Name: Jasmine Clayton MRN: [id number]  Date: 06/29/2011  DOB: Jun 23, 1951  Status:outpatient    CC:O'SULLIVAN,MELISSA S., NP, NP  Lucile Shutters,*    REFERRING PHYSICIAN: Lucile Shutters,*   DIAGNOSIS: Stage III (T1, N1, M0) adenocarcinoma of the proximal rectum.   HISTORY OF PRESENT ILLNESS::Jasmine Clayton is a 60 y.o. female who is seen today for the courtesy Dr. Truett Perna for consideration of radiation therapy along with chemotherapy in the management of her T1, N1, M0 adenocarcinoma of the proximal rectum. She presented with a one to two-year history of rectal bleeding. Colonoscopy on 04/09/2011 showed a friable mass at the proximal rectum. The mass was palpable at approximately 8 cm from the anal verge. Pathology showed a moderately differentiated adenocarcinoma arising in a large tubulovillous adenoma. Endoscopic ultrasound by Dr. Christella Hartigan should a 2 cm mobile mass which was felt to be T1 on ultrasound. 05/19/2011 she was taken to the operating room by Dr. Dwain Sarna. He performed a low anterior resection with a diverting ileostomy. Her postoperative course was complicated by necrosis of the stoma and she was taken back to the OR on 05/27/2011 and underwent an transverse colostomy. On review of her pathology she was found to have a T1 primary along with one of 9 lymph nodes containing metastatic disease. Margins were clear. She was seen in consultation by Dr. Truett Perna he plans to proceed with CAPOX chemotherapy followed by radiation and concurrent Xeloda. She is tentatively scheduled to begin chemotherapy on December 11. Her colostomy is functioning well. Marland Kitchen   PREVIOUS RADIATION THERAPY: No   PAST MEDICAL HISTORY:  has a past medical history of DVT (deep vein thrombosis) in pregnancy (1979); Stress incontinence, female; Morbid obesity; Degenerative joint disease; Adenocarcinoma of colon (04/12/2011); and  Ovarian tumor (12/27/2009).     PAST SURGICAL HISTORY: Past Surgical History  Procedure Date  . Total abdominal hysterectomy w/ bilateral salpingoophorectomy 11/30/09  . Total hip arthroplasty 2002    left hip  . Mass excision 2011    "mass removed from ovaries"  . Cesarean section   . Breast cyst excision   . Keloid excision 1982  . Joint replacement 2002    hip replacement  . Colon surgery     LAR, reoperation for necrotic ileostomy, transverse colostomy     ETIOLOGIC FACTORS:   FAMILY HISTORY: family history includes Cancer (age of onset:51) in her sister; Cancer (age of onset:60) in her mother; Colon cancer in her sister; and Diabetes in her father.   SOCIAL HISTORY:  reports that she has never smoked. She has never used smokeless tobacco. She reports that she drinks about 3.5 ounces of alcohol per week. She reports that she does not use illicit drugs.   ALLERGIES: Review of patient's allergies indicates no known allergies.   MEDICATIONS:@MEDADMINPROSE @   REVIEW OF SYSTEMS:  Pertinent items are noted in HPI.    PHYSICAL EXAM:  height is 5' (1.524 m) and weight is 238 lb (107.956 kg). Her oral temperature is 98.4 F (36.9 C). Her blood pressure is 111/65 and her pulse is 98. Her respiration is 18.  Head and neck examination grossly unremarkable. Nodes without palpable cervical or supraclavicular lymphadenopathy. Chest: Lungs clear. Back: Without spinal or CVA discomfort. Abdomen: Central colostomy with adjacent wound along the right abdomen granulating well. Rectal: I would I can palpate the inferior aspect of her anastomosis. Extremities trace ankle edema. Neurologic examination grossly nonfocal.  LABORATORY DATA:  Results for orders placed in visit on 06/28/11 (from the past 48 hour(s))  COMPREHENSIVE METABOLIC PANEL     Status: Abnormal   Collection Time   06/28/11 10:49 AM      Component Value Range Comment   Sodium 141  135 - 145 (mEq/L)    Potassium 4.7  3.5  - 5.3 (mEq/L)    Chloride 105  96 - 112 (mEq/L)    CO2 26  19 - 32 (mEq/L)    Glucose, Bld 93  70 - 99 (mg/dL)    BUN 11  6 - 23 (mg/dL)    Creatinine, Ser 4.09  0.50 - 1.10 (mg/dL)    Total Bilirubin 0.5  0.3 - 1.2 (mg/dL)    Alkaline Phosphatase 96  39 - 117 (U/L)    AST 28  0 - 37 (U/L)    ALT 42 (*) 0 - 35 (U/L)    Total Protein 6.3  6.0 - 8.3 (g/dL)    Albumin 3.8  3.5 - 5.2 (g/dL)    Calcium 9.8  8.4 - 10.5 (mg/dL)   CBC WITH DIFFERENTIAL     Status: Abnormal   Collection Time   06/28/11 10:49 AM      Component Value Range Comment   WBC 7.1  3.9 - 10.3 (10e3/uL)    NEUT# 3.9  1.5 - 6.5 (10e3/uL)    HGB 9.9 (*) 11.6 - 15.9 (g/dL)    HCT 81.1 (*) 91.4 - 46.6 (%)    Platelets 314  145 - 400 (10e3/uL)    MCV 81.1  79.5 - 101.0 (fL)    MCH 25.3  25.1 - 34.0 (pg)    MCHC 31.2 (*) 31.5 - 36.0 (g/dL)    RBC 7.82  9.56 - 2.13 (10e6/uL)    RDW 16.7 (*) 11.2 - 14.5 (%)    lymph# 2.3  0.9 - 3.3 (10e3/uL)    MONO# 0.7  0.1 - 0.9 (10e3/uL)    Eosinophils Absolute 0.1  0.0 - 0.5 (10e3/uL)    Basophils Absolute 0.0  0.0 - 0.1 (10e3/uL)    NEUT% 55.6  38.4 - 76.8 (%)    LYMPH% 33.0  14.0 - 49.7 (%)    MONO% 9.3  0.0 - 14.0 (%)    EOS% 2.0  0.0 - 7.0 (%)    BASO% 0.1  0.0 - 2.0 (%)    nRBC 0  0 - 0 (%)           IMPRESSION: Stage III (T1, N1, M0) adenocarcinoma of the proximal rectum. I explained to the patient and her daughter that the standard of care, based on the NCCN guidelines, is to proceed with chemoradiation. I'm in agreement with comminution chemotherapy to start, to be followed by adjuvant radiation therapy with Xeloda. We discussed the potential acute and late toxicities of radiation therapy along with chemotherapy she wishes to proceed as outlined.   PLAN: She will proceed with chemotherapy as outlined by Dr. Truett Perna. I asked the Dr. Truett Perna contact me 2 weeks prior to her need for adjuvant radiation therapy. I asked that she be scheduled for a followup visit prior to  simulation/treatment planning.  I spent 60 minutes minutes face to face with the patient and more than 50% of that time was spent in counseling and/or coordination of care.

## 2011-06-29 NOTE — Progress Notes (Signed)
Encounter addended by: Amanda Pea, RN on: 06/29/2011  5:42 PM<BR>     Documentation filed: Inpatient Patient Education

## 2011-06-29 NOTE — Progress Notes (Signed)
Please see the Nurse Progress Note in the MD Initial Consult Encounter for this patient. 

## 2011-06-29 NOTE — Patient Instructions (Addendum)
Current Outpatient Prescriptions  Medication Sig Dispense Refill  . aspirin 81 MG tablet Take 81 mg by mouth daily.        . B Complex Vitamins (B-COMPLEX/B-12 PO) Take 1 tablet by mouth daily.        . calcium-vitamin D (OSCAL) 250-125 MG-UNIT per tablet Take 1 tablet by mouth daily.       . capecitabine (XELODA) 500 MG tablet Take 4 tablets (2,000 mg total) by mouth 2 (two) times daily after a meal. X 14 days--start day of oxaliplatin chemotherapy, 7 days off  112 tablet  0  . fish oil-omega-3 fatty acids 1000 MG capsule Take 1 g by mouth daily.        Marland Kitchen omeprazole (PRILOSEC OTC) 20 MG tablet Take 20 mg by mouth daily.       Marland Kitchen oxyCODONE-acetaminophen (PERCOCET) 5-325 MG per tablet Take 1-2 tablets by mouth every 4 (four) hours as needed.  40 tablet  0  . oxyCODONE-acetaminophen (ROXICET) 5-325 MG per tablet Take 1-2 tablets by mouth every 6 (six) hours as needed for pain.  40 tablet  0  . tetrahydrozoline 0.05 % ophthalmic solution Place 1 drop into both eyes 2 (two) times daily as needed. For dry/irritated eyes ([place])

## 2011-07-06 ENCOUNTER — Telehealth: Payer: Self-pay | Admitting: *Deleted

## 2011-07-06 NOTE — Telephone Encounter (Signed)
Received VM from [facility] that only insurance information they have on record is [employer] and to use this for her Xeloda she will need to obtain drug from the [place]. Does she have any other insurance coverage to use for the Xeloda? Forwarded this message to Axel Filler in managed care department.

## 2011-07-07 ENCOUNTER — Ambulatory Visit (INDEPENDENT_AMBULATORY_CARE_PROVIDER_SITE_OTHER): Admitting: General Surgery

## 2011-07-07 ENCOUNTER — Encounter (INDEPENDENT_AMBULATORY_CARE_PROVIDER_SITE_OTHER): Payer: Self-pay | Admitting: General Surgery

## 2011-07-07 VITALS — BP 162/100 | HR 60 | Temp 97.3°F | Resp 16 | Ht 60.0 in | Wt 236.5 lb

## 2011-07-07 DIAGNOSIS — Z09 Encounter for follow-up examination after completed treatment for conditions other than malignant neoplasm: Secondary | ICD-10-CM

## 2011-07-07 MED ORDER — OXYCODONE-ACETAMINOPHEN 10-325 MG PO TABS: 1.0000 | ORAL_TABLET | Freq: Four times a day (QID) | ORAL | Status: DC | PRN

## 2011-07-07 NOTE — Progress Notes (Signed)
Subjective:     Patient ID: Jasmine Clayton, female   DOB: September 04, 1950, 60 y.o.   MRN: [id number]  HPI 60yof s/p lar for node positive rectal cancer, complicated by necrotic ileostomy after she leaked on procto, went back to OR for transverse colostomy. She has no complaints. Denies fevers, eating well. She has no real complaints.  Her drain is not really functional now     Review of Systems     Objective:   Physical Exam    healing rlq wound nearly epithelialized, midline wound healing, stoma functional, drain with some murky fluid in it but this has not been drained in ten days and drain has pulled back and no longer functional  Assessment:     Rectal cancer    Plan:     I removed her drain today. I am somewhat concerned that she may have a problem after that shrinkage removed the she's done well and has not been functional it appears in about a week or so. I gave her some warning signs to watch for. I know she is due to begin chemotherapy on December 11 I will hold on port placement for right now to assure she's not to have a trouble after removing the drain. I will let Dr. Truett Perna know that as well.

## 2011-07-09 ENCOUNTER — Telehealth: Payer: Self-pay | Admitting: *Deleted

## 2011-07-09 NOTE — Telephone Encounter (Signed)
Call from pt to follow up on Xeloda rx. She gave contact # for [place] [phone]) to see if med can be filled through them. Instructed pt to call the number listed on her insurance card and ask which pharmacy can fill Xeloda rx. She verbalized understanding.

## 2011-07-13 ENCOUNTER — Telehealth: Payer: Self-pay | Admitting: Oncology

## 2011-07-13 ENCOUNTER — Ambulatory Visit

## 2011-07-13 ENCOUNTER — Encounter: Payer: Self-pay | Admitting: Oncology

## 2011-07-13 ENCOUNTER — Ambulatory Visit (HOSPITAL_BASED_OUTPATIENT_CLINIC_OR_DEPARTMENT_OTHER): Admitting: Oncology

## 2011-07-13 VITALS — BP 128/69 | HR 78 | Temp 97.1°F | Ht 60.0 in | Wt 237.1 lb

## 2011-07-13 DIAGNOSIS — C20 Malignant neoplasm of rectum: Secondary | ICD-10-CM

## 2011-07-13 DIAGNOSIS — C189 Malignant neoplasm of colon, unspecified: Secondary | ICD-10-CM

## 2011-07-13 NOTE — Telephone Encounter (Signed)
gve the pt her dec,jan 2013 appt calendar °

## 2011-07-13 NOTE — Progress Notes (Signed)
OFFICE PROGRESS NOTE   INTERVAL HISTORY:   She returns as scheduled. The abdominal wound is healing. She has no new complaint. She has not been able to obtain the Xeloda via her insurance company. She is scheduled to see Dr. Dwain Sarna on December 13.  Objective:  Vital signs in last 24 hours:  Blood pressure 128/69, pulse 78, temperature 97.1 F (36.2 C), temperature source Oral, height 5' (1.524 m), weight 237 lb 1.6 oz (107.548 kg), last menstrual period 11/30/2009.    Resp: Lungs clear bilaterally Cardio: Regular rate and rhythm GI: Nontender. Midline colostomy with Brown stool. Lower midline incision without surrounding erythema or exudate. Pink granulation tissue is present. Vascular: No leg edema       Lab Results:  Lab Results  Component Value Date   WBC 7.1 06/28/2011   HGB 9.9* 06/28/2011   HCT 31.7* 06/28/2011   MCV 81.1 06/28/2011   PLT 314 06/28/2011      Medications: I have reviewed the patient's current medications.  Assessment/Plan: 1. 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   2. Necrosis of the stoma, status post an ileocecectomy with transverse colostomy 05/27/2011.   3. Open abdominal wound-the wound is without evidence of infection and appears to be healing.   4. History of a DVT while pregnant in 1979   5. Normal preoperative CEA (1.0 on 05/14/2011)   6. Family history of cancer including endometrial cancer in her mother and colon cancer in her sister we will submit the primary tumor for microsatellite instability testing.    Disposition:  The abdominal wound is healing. The initial plan was to proceed with CAPOX chemotherapy. She is having difficulty obtaining the Xeloda. We decided to switch to the FOLFOX regimen. I reviewed the specifics of this regimen with the patient and her daughter. She agrees to proceed  with FOLFOX.  We will ask Dr. Dwain Sarna to place a Port-A-Cath with the plan to initiate FOLFOX chemotherapy on December 20. She will return for an office visit and cycle #2 FOLFOX on January 3 .   Lucile Shutters, MD  07/13/2011  6:41 PM

## 2011-07-14 ENCOUNTER — Telehealth: Payer: Self-pay | Admitting: *Deleted

## 2011-07-14 DIAGNOSIS — C189 Malignant neoplasm of colon, unspecified: Secondary | ICD-10-CM

## 2011-07-14 MED ORDER — LIDOCAINE-PRILOCAINE 2.5-2.5 % EX CREA: TOPICAL_CREAM | CUTANEOUS | Status: DC | PRN

## 2011-07-14 MED ORDER — PROCHLORPERAZINE MALEATE 10 MG PO TABS: 10.0000 mg | ORAL_TABLET | Freq: Four times a day (QID) | ORAL | Status: AC | PRN

## 2011-07-14 NOTE — Telephone Encounter (Signed)
New scripts for EMLA cream and Compazine escribed to pharmacy as patient was informed at visit.

## 2011-07-15 ENCOUNTER — Ambulatory Visit (INDEPENDENT_AMBULATORY_CARE_PROVIDER_SITE_OTHER): Admitting: General Surgery

## 2011-07-15 ENCOUNTER — Encounter (INDEPENDENT_AMBULATORY_CARE_PROVIDER_SITE_OTHER): Payer: Self-pay | Admitting: General Surgery

## 2011-07-15 VITALS — BP 142/98 | HR 68 | Temp 97.4°F | Resp 18 | Ht 60.0 in | Wt 237.4 lb

## 2011-07-15 DIAGNOSIS — Z09 Encounter for follow-up examination after completed treatment for conditions other than malignant neoplasm: Secondary | ICD-10-CM

## 2011-07-15 NOTE — Progress Notes (Signed)
Subjective:     Patient ID: Jasmine Clayton, female   DOB: 03-Jan-1951, 60 y.o.   MRN: [id number]  HPI This is a 60 year old female underwent a complicated low anterior resection for rectal cancer. I took the drain out last week and had her come in today for a final check prior to beginning chemotherapy. She reports no complaints today.  Review of Systems     Objective:   Physical Exam     Assessment:     S/p Lar for rectal cancer  Plan:     Can begin chemotherapy I don't think I can put port in by time she needs to start so will refer to IR F/U one month

## 2011-07-22 ENCOUNTER — Other Ambulatory Visit: Payer: Self-pay | Admitting: *Deleted

## 2011-07-22 ENCOUNTER — Inpatient Hospital Stay

## 2011-07-22 ENCOUNTER — Other Ambulatory Visit: Payer: Self-pay | Admitting: Oncology

## 2011-07-22 ENCOUNTER — Ambulatory Visit (HOSPITAL_BASED_OUTPATIENT_CLINIC_OR_DEPARTMENT_OTHER)

## 2011-07-22 DIAGNOSIS — C189 Malignant neoplasm of colon, unspecified: Secondary | ICD-10-CM

## 2011-07-22 LAB — CBC WITH DIFFERENTIAL/PLATELET
BASO%: 0.4 % (ref 0.0–2.0)
LYMPH%: 45.3 % (ref 14.0–49.7)
MCHC: 31.2 g/dL — ABNORMAL LOW (ref 31.5–36.0)
MCV: 79.6 fL (ref 79.5–101.0)
MONO#: 0.6 10*3/uL (ref 0.1–0.9)
MONO%: 9.4 % (ref 0.0–14.0)
Platelets: 294 10*3/uL (ref 145–400)
RBC: 4.07 10*6/uL (ref 3.70–5.45)
RDW: 16.4 % — ABNORMAL HIGH (ref 11.2–14.5)
WBC: 6.8 10*3/uL (ref 3.9–10.3)
nRBC: 0 % (ref 0–0)

## 2011-07-23 ENCOUNTER — Encounter: Payer: Self-pay | Admitting: *Deleted

## 2011-07-23 ENCOUNTER — Other Ambulatory Visit: Payer: Self-pay | Admitting: Neurology

## 2011-07-23 ENCOUNTER — Encounter (HOSPITAL_COMMUNITY): Payer: Self-pay

## 2011-07-23 NOTE — Progress Notes (Signed)
Late entry for 07/22/2011. Pt in office for chemotherapy. Has not had port a cath placed. Coordinated port a cath placement with Inetta Fermo in Interventional Radiology.  Pt given instructions to arrive at [facility] admitting 0715 Monday 12/24. Pt reports she occasionally takes baby aspirin. She understands to discontinue aspirin at this time.  Pt scheduled for 1st chemotherapy tx on 07/26/11 after port placement. Radiology to leave port accessed.

## 2011-07-24 ENCOUNTER — Encounter

## 2011-07-26 ENCOUNTER — Inpatient Hospital Stay (HOSPITAL_COMMUNITY): Admission: RE | Admit: 2011-07-26 | Source: Ambulatory Visit

## 2011-07-26 ENCOUNTER — Other Ambulatory Visit (INDEPENDENT_AMBULATORY_CARE_PROVIDER_SITE_OTHER): Payer: Self-pay | Admitting: General Surgery

## 2011-07-26 ENCOUNTER — Other Ambulatory Visit: Payer: Self-pay | Admitting: Oncology

## 2011-07-26 ENCOUNTER — Ambulatory Visit (HOSPITAL_COMMUNITY)
Admission: RE | Admit: 2011-07-26 | Discharge: 2011-07-26 | Disposition: A | Discharge status: Home / Self Care | Source: Ambulatory Visit | Attending: General Surgery | Admitting: General Surgery

## 2011-07-26 ENCOUNTER — Ambulatory Visit (HOSPITAL_BASED_OUTPATIENT_CLINIC_OR_DEPARTMENT_OTHER)

## 2011-07-26 VITALS — BP 134/85 | HR 70 | Temp 97.5°F

## 2011-07-26 DIAGNOSIS — C20 Malignant neoplasm of rectum: Secondary | ICD-10-CM

## 2011-07-26 DIAGNOSIS — Z09 Encounter for follow-up examination after completed treatment for conditions other than malignant neoplasm: Secondary | ICD-10-CM

## 2011-07-26 DIAGNOSIS — Z5111 Encounter for antineoplastic chemotherapy: Secondary | ICD-10-CM

## 2011-07-26 DIAGNOSIS — C189 Malignant neoplasm of colon, unspecified: Secondary | ICD-10-CM

## 2011-07-26 HISTORY — PX: PORTACATH PLACEMENT: SHX2246

## 2011-07-26 MED ORDER — FENTANYL CITRATE 0.05 MG/ML IJ SOLN
INTRAMUSCULAR | Status: AC | PRN
  Administered 2011-07-26: 50 ug via INTRAVENOUS
  Administered 2011-07-26: 100 ug via INTRAVENOUS
  Administered 2011-07-26: 50 ug via INTRAVENOUS

## 2011-07-26 MED ORDER — SODIUM CHLORIDE 0.9 % IV SOLN
2400.0000 mg/m2 | INTRAVENOUS | Status: DC
  Administered 2011-07-26: 5150 mg via INTRAVENOUS
  Filled 2011-07-26: qty 103

## 2011-07-26 MED ORDER — ONDANSETRON 8 MG/50ML IVPB (CHCC)
8.0000 mg | Freq: Once | INTRAVENOUS | Status: AC
  Administered 2011-07-26: 8 mg via INTRAVENOUS

## 2011-07-26 MED ORDER — DEXTROSE 5 % IV SOLN: Freq: Once | INTRAVENOUS | Status: DC

## 2011-07-26 MED ORDER — FLUOROURACIL CHEMO INJECTION 2.5 GM/50ML
400.0000 mg/m2 | Freq: Once | INTRAVENOUS | Status: AC
  Administered 2011-07-26: 850 mg via INTRAVENOUS
  Filled 2011-07-26: qty 17

## 2011-07-26 MED ORDER — SODIUM CHLORIDE 0.9 % IV SOLN
INTRAVENOUS | Status: DC
  Administered 2011-07-26: 08:00:00 via INTRAVENOUS

## 2011-07-26 MED ORDER — SODIUM CHLORIDE 0.9 % IJ SOLN
10.0000 mL | INTRAMUSCULAR | Status: DC | PRN
  Filled 2011-07-26: qty 10

## 2011-07-26 MED ORDER — MIDAZOLAM HCL 5 MG/5ML IJ SOLN
INTRAMUSCULAR | Status: AC | PRN
  Administered 2011-07-26: 2 mg via INTRAVENOUS
  Administered 2011-07-26: 1 mg via INTRAVENOUS

## 2011-07-26 MED ORDER — OXALIPLATIN CHEMO INJECTION 100 MG/20ML
85.0000 mg/m2 | Freq: Once | INTRAVENOUS | Status: AC
  Administered 2011-07-26: 180 mg via INTRAVENOUS
  Filled 2011-07-26: qty 36

## 2011-07-26 MED ORDER — HEPARIN SOD (PORK) LOCK FLUSH 100 UNIT/ML IV SOLN
500.0000 [IU] | Freq: Once | INTRAVENOUS | Status: DC | PRN
  Filled 2011-07-26: qty 5

## 2011-07-26 MED ORDER — DEXAMETHASONE SODIUM PHOSPHATE 10 MG/ML IJ SOLN
10.0000 mg | Freq: Once | INTRAMUSCULAR | Status: AC
  Administered 2011-07-26: 10 mg via INTRAVENOUS

## 2011-07-26 MED ORDER — DEXTROSE 5 % IV SOLN
400.0000 mg/m2 | Freq: Once | INTRAVENOUS | Status: AC
  Administered 2011-07-26: 856 mg via INTRAVENOUS
  Filled 2011-07-26: qty 42.8

## 2011-07-26 MED ORDER — CEFAZOLIN SODIUM 1-5 GM-% IV SOLN
1.0000 g | INTRAVENOUS | Status: AC
  Administered 2011-07-26: 1 g via INTRAVENOUS
  Filled 2011-07-26: qty 50

## 2011-07-26 NOTE — H&P (Signed)
Jasmine Clayton is an 60 y.o. female with history of rectal cancer in need of PAC placement for IV access.   Related encounter: Office Visit from 07/13/2011 in [facility]  OFFICE PROGRESS NOTE   INTERVAL HISTORY:    She returns as scheduled. The abdominal wound is healing. She has no new complaint. She has not been able to obtain the Xeloda via her insurance company. She is scheduled to see Dr. Dwain Sarna on December 13.  Objective:  Vital signs in last 24 hours:  Blood pressure 128/69, pulse 78, temperature 97.1 F (36.2 C), temperature source Oral, height 5' (1.524 m), weight 237 lb 1.6 oz (107.548 kg), last menstrual period 11/30/2009.    Resp: Lungs clear bilaterally Cardio: Regular rate and rhythm GI: Nontender. Midline colostomy with Brown stool. Lower midline incision without surrounding erythema or exudate. Pink granulation tissue is present. Vascular: No leg edema        Lab Results:    Lab Results   Component  Value  Date     WBC  7.1  06/28/2011     HGB  9.9*  06/28/2011     HCT  31.7*  06/28/2011     MCV  81.1  06/28/2011     PLT  314  06/28/2011       Medications: I have reviewed the patient's current medications.  Assessment/Plan: 1. 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   2. Necrosis of the stoma, status post an ileocecectomy with transverse colostomy 05/27/2011.   3. Open abdominal wound-the wound is without evidence of infection and appears to be healing.   4. History of a DVT while pregnant in 1979   5. Normal preoperative CEA (1.0 on 05/14/2011)   6. Family history of cancer including endometrial cancer in her mother and colon cancer in her sister we will submit the primary tumor for microsatellite instability testing.    Disposition:  The abdominal wound is healing. The  initial plan was to proceed with CAPOX chemotherapy. She is having difficulty obtaining the Xeloda. We decided to switch to the FOLFOX regimen. I reviewed the specifics of this regimen with the patient and her daughter. She agrees to proceed with FOLFOX.  We will ask Dr. Dwain Sarna to place a Port-A-Cath with the plan to initiate FOLFOX chemotherapy on December 20. She will return for an office visit and cycle #2 FOLFOX on January 3 .   Lucile Shutters, MD  07/13/2011   6:41 PM          Chief Complaint: rectal ca with poor IV access. HPI: See recent H&P from oncology.  Past Medical History  Diagnosis Date  . DVT (deep vein thrombosis) in pregnancy 1979    during pregnancy. lower extremity  . Stress incontinence, female   . Morbid obesity   . Degenerative joint disease   . Adenocarcinoma of colon 04/12/2011  . 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 Total abdominal hysterectomy w/ bilateral salpingoophorectomy 11/30/09  . Total hip arthroplasty 2002    left hip  . Mass excision 2011    "mass removed from ovaries"  . Cesarean section   . Breast cyst excision   . Keloid excision 1982  . Joint replacement 2002    hip replacement  .  Colon surgery     LAR, reoperation for necrotic ileostomy, transverse colostomy  . Colostomy     Family History  Problem Relation Age of Onset  . Cancer Mother 44    endometrial cancer  . Diabetes Father   . Colon cancer Sister   . Cancer Sister 42    colon cancer currently undergoing chemotherapy   Social History:  reports that she has never smoked. She has never used smokeless tobacco. She reports that she drinks about 3.5 ounces of alcohol per week. She reports that she does not use illicit drugs.  Allergies: No Known Allergies  Medications Prior to Admission  Medication Sig Dispense Refill  . aspirin 81 MG tablet Take 81 mg  by mouth daily.        . B Complex Vitamins (B-COMPLEX/B-12 PO) Take 1 tablet by mouth daily.        . calcium-vitamin D (OSCAL) 250-125 MG-UNIT per tablet Take 1 tablet by mouth daily.       . fish oil-omega-3 fatty acids 1000 MG capsule Take 1 g by mouth daily.        Marland Kitchen lidocaine-prilocaine (EMLA) cream Apply topically as needed. Apply to [facility] 2 hours prior to stick and cover with plastic wrap  30 g  prn  . omeprazole (PRILOSEC OTC) 20 MG tablet Take 20 mg by mouth daily.       Marland Kitchen oxyCODONE-acetaminophen (PERCOCET) 10-325 MG per tablet Take 1 tablet by mouth every 6 (six) hours as needed for pain.  30 tablet  0  . tetrahydrozoline 0.05 % ophthalmic solution Place 1 drop into both eyes 2 (two) times daily as needed. For dry/irritated eyes (Visine)       Medications Prior to Admission  Medication Dose Route Frequency Provider Last Rate Last Dose  . 0.9 %  sodium chloride infusion   Intravenous Continuous David Rinehuls, PA      . ceFAZolin (ANCEF) IVPB 1 g/50 mL premix  1 g Intravenous to [employer], PA          Review of Systems  Constitutional: Negative.   HENT: Negative.   Eyes: Negative.   Respiratory: Negative.   Cardiovascular: Negative.   Skin: Negative.   Neurological: Negative.   Psychiatric/Behavioral: Negative.     Blood pressure 152/89, pulse 101, temperature 98.7 F (37.1 C), temperature source Oral, resp. rate 18, height 5' (1.524 m), weight 236 lb (107.049 kg), last menstrual period 11/30/2009, SpO2 96.00%. Physical Exam  Constitutional: She is oriented to person, place, and time. She appears well-developed and well-nourished.  HENT:  Head: Normocephalic and atraumatic.  Cardiovascular: Normal rate, regular rhythm and normal heart sounds.  Exam reveals no gallop and no friction rub.   No murmur heard. Respiratory: Effort normal and breath sounds normal. No respiratory distress. She has no wheezes. She has no rales.  GI: Soft. Bowel sounds are normal.    Musculoskeletal: Normal range of motion.  Neurological: She is alert and oriented to person, place, and time.  Skin: Skin is warm and dry.     Assessment/Plan Patient presents today for port a cath insertion- to remain accessed for chemo later today.  Procedure details, risks and benefits reviewed with the patient by myself and Dr. Grace Isaac. Labs WNL to proceed. Written consent obtained.   Sem Mccaughey D 07/26/2011, 8:19 AM

## 2011-07-26 NOTE — Progress Notes (Signed)
Pt entered clinic today with Port-a-Cath already accessed. Site flushes well with brisk blood return noted. Free flow when connected to D5 line.

## 2011-07-26 NOTE — Procedures (Signed)
Successful placement of right jugular approach port-a-cath with tip at superior aspect of the right atrium. The port was left accessed for immediate use by the infusion center. No immediate post procedural complications.

## 2011-07-28 ENCOUNTER — Ambulatory Visit (HOSPITAL_BASED_OUTPATIENT_CLINIC_OR_DEPARTMENT_OTHER)

## 2011-07-28 ENCOUNTER — Other Ambulatory Visit: Payer: Self-pay | Admitting: Certified Registered Nurse Anesthetist

## 2011-07-28 ENCOUNTER — Telehealth: Payer: Self-pay | Admitting: Certified Registered Nurse Anesthetist

## 2011-07-28 VITALS — BP 121/68 | HR 72 | Temp 97.7°F

## 2011-07-28 DIAGNOSIS — Z469 Encounter for fitting and adjustment of unspecified device: Secondary | ICD-10-CM

## 2011-07-28 DIAGNOSIS — C189 Malignant neoplasm of colon, unspecified: Secondary | ICD-10-CM

## 2011-07-28 MED ORDER — HEPARIN SOD (PORK) LOCK FLUSH 100 UNIT/ML IV SOLN
500.0000 [IU] | Freq: Once | INTRAVENOUS | Status: AC | PRN
  Administered 2011-07-28: 500 [IU]
  Filled 2011-07-28: qty 5

## 2011-07-28 MED ORDER — SODIUM CHLORIDE 0.9 % IJ SOLN
10.0000 mL | INTRAMUSCULAR | Status: DC | PRN
  Administered 2011-07-28: 10 mL
  Filled 2011-07-28: qty 10

## 2011-07-28 NOTE — Telephone Encounter (Signed)
Chemo follow up done in flush room when pt. Arrived for pump d/c by Jennings Books RN.

## 2011-07-30 ENCOUNTER — Telehealth (INDEPENDENT_AMBULATORY_CARE_PROVIDER_SITE_OTHER): Payer: Self-pay

## 2011-07-30 NOTE — Telephone Encounter (Signed)
Ok per Dr. Johna Sheriff - RX: Percocet 5/325,  #30, left at front desk, patient aware- c/o pain level 4-5- Rectal cancer patient-  Chemo started 07/26/2011- taking stool softer- abdominal pain.  Recent transverse colostomy.

## 2011-08-01 ENCOUNTER — Other Ambulatory Visit: Payer: Self-pay | Admitting: Oncology

## 2011-08-04 ENCOUNTER — Other Ambulatory Visit: Payer: Self-pay | Admitting: *Deleted

## 2011-08-04 ENCOUNTER — Other Ambulatory Visit: Payer: Self-pay | Admitting: Oncology

## 2011-08-05 ENCOUNTER — Inpatient Hospital Stay

## 2011-08-05 ENCOUNTER — Other Ambulatory Visit (HOSPITAL_BASED_OUTPATIENT_CLINIC_OR_DEPARTMENT_OTHER): Admitting: Lab

## 2011-08-05 ENCOUNTER — Ambulatory Visit (HOSPITAL_BASED_OUTPATIENT_CLINIC_OR_DEPARTMENT_OTHER): Admitting: Nurse Practitioner

## 2011-08-05 VITALS — BP 157/83 | HR 98 | Temp 97.6°F | Ht 60.0 in | Wt 234.2 lb

## 2011-08-05 DIAGNOSIS — Z79899 Other long term (current) drug therapy: Secondary | ICD-10-CM

## 2011-08-05 DIAGNOSIS — C189 Malignant neoplasm of colon, unspecified: Secondary | ICD-10-CM

## 2011-08-05 DIAGNOSIS — C20 Malignant neoplasm of rectum: Secondary | ICD-10-CM

## 2011-08-05 DIAGNOSIS — Z933 Colostomy status: Secondary | ICD-10-CM

## 2011-08-05 DIAGNOSIS — Z9889 Other specified postprocedural states: Secondary | ICD-10-CM

## 2011-08-05 LAB — COMPREHENSIVE METABOLIC PANEL
Alkaline Phosphatase: 104 U/L (ref 39–117)
Creatinine, Ser: 0.9 mg/dL (ref 0.50–1.10)
Glucose, Bld: 105 mg/dL — ABNORMAL HIGH (ref 70–99)
Sodium: 139 mEq/L (ref 135–145)
Total Bilirubin: 0.3 mg/dL (ref 0.3–1.2)
Total Protein: 7.1 g/dL (ref 6.0–8.3)

## 2011-08-05 LAB — CBC WITH DIFFERENTIAL/PLATELET
Basophils Absolute: 0 10*3/uL (ref 0.0–0.1)
EOS%: 3.3 % (ref 0.0–7.0)
HGB: 10 g/dL — ABNORMAL LOW (ref 11.6–15.9)
MCH: 24.5 pg — ABNORMAL LOW (ref 25.1–34.0)
MCV: 78.7 fL — ABNORMAL LOW (ref 79.5–101.0)
MONO%: 8.1 % (ref 0.0–14.0)
RBC: 4.08 10*6/uL (ref 3.70–5.45)
RDW: 16.5 % — ABNORMAL HIGH (ref 11.2–14.5)

## 2011-08-05 NOTE — Progress Notes (Signed)
OFFICE PROGRESS NOTE  Interval history:  Jasmine Clayton returns as scheduled. She completed cycle 1 FOLFOX 07/26/2011. Around day 3 she became nauseated. Compazine relief of nausea. She noted that she emptied the ostomy more frequently for several days following the chemotherapy. Yesterday she emptied the colostomy bag 3 or 4 times. The output is mainly liquid with a few solid particles. No mouth sores. She recently resumed cold food and drink with no problems. She denies numbness or tingling in her hands or feet.   Objective: Blood pressure 157/83, pulse 98, temperature 97.6 F (36.4 C), temperature source Oral, height 5' (1.524 m), weight 234 lb 3.2 oz (106.232 kg), last menstrual period 11/30/2009.  Oropharynx is without thrush or ulceration. Lungs are clear. Regular cardiac rhythm. Port-A-Cath site is without erythema. Abdomen is soft. No hepatomegaly. Midline colostomy with liquid brown stool in the collection bag. The lower midline incision is without surrounding erythema. The wound edges appear clean. Extremities without edema.   Lab Results: Lab Results  Component Value Date   WBC 5.5 08/05/2011   HGB 10.0* 08/05/2011   HCT 32.1* 08/05/2011   MCV 78.7* 08/05/2011   PLT 207 08/05/2011    Chemistry:    Chemistry      Component Value Date/Time   NA 141 06/28/2011 1049   K 4.7 06/28/2011 1049   CL 105 06/28/2011 1049   CO2 26 06/28/2011 1049   BUN 11 06/28/2011 1049   CREATININE 0.92 06/28/2011 1049   CREATININE 0.98 03/15/2011 0828      Component Value Date/Time   CALCIUM 9.8 06/28/2011 1049   ALKPHOS 96 06/28/2011 1049   AST 28 06/28/2011 1049   ALT 42* 06/28/2011 1049   BILITOT 0.5 06/28/2011 1049       Studies/Results: Ir Fluoro Guide Cv Line Right  07/26/2011  *RADIOLOGY REPORT*  Indication: Rectal cancer, post surgery, initiating chemotherapy  IMPLANTED PORT A CATH PLACEMENT WITH ULTRASOUND AND FLUOROSCOPIC GUIDANCE  Sedation: Versed 3 mg IV; Fentanyl 200 mcg IV; Ancef 1  gm IV; IV antibiotic was given in an appropriate time interval prior to skin puncture.  Total Moderate Sedation Time: 25 minutes.  Contrast: None  Fluoroscopy Time: 0.4 minutes.  Complications: None immediate  Procedure:  The procedure, risks, benefits, and alternatives were explained to the patient.  Questions regarding the procedure were encouraged and answered.  The patient understands and consents to the procedure.  The right neck and chest were prepped with chlorhexidine in a sterile fashion, and a sterile drape was applied covering the operative field.  Maximum barrier sterile technique with sterile gowns and gloves were used for the procedure.  A timeout was performed prior to the initiation of the procedure.  Local anesthesia was provided with 1% lidocaine with epinephrine.  After creating a small venotomy incision, a micropuncture kit was utilized to access the right internal jugular vein under direct, real-time ultrasound guidance.  Ultrasound image documentation was performed.  The microwire was kinked to measure appropriate catheter length.  A subcutaneous port pocket was then created along the upper chest wall utilizing a combination of sharp and blunt dissection.  The pocket was irrigated with sterile saline.  A single lumen power injectable port was chosen for placement.  The 8 Fr catheter was tunneled from the port pocket site to the venotomy incision.  The port was placed in the pocket.  The external catheter was trimmed to appropriate length.  At the venotomy, an 8 Fr peel-away sheath was placed over a  guidewire under fluoroscopic guidance.  The catheter was then placed through the sheath and the sheath was removed.  Final catheter positioning was confirmed and documented with a fluoroscopic spot radiograph.  The port was accessed with a needle and aspirated and flushed with heparinized saline.  The venotomy and port pocket incisions were closed with subcutaneous 2-0 Vicryl and a running  subcuticular 4-0 Vicryl. Dermabond and Steri-strips were applied to both incisions. Dressings were placed.  The patient tolerated the procedure well without immediate post procedural complication.  Findings:  After catheter placement, the tip lies at the superior aspect of the right atrium.  The catheter aspirates normally and is ready for immediate use.  IMPRESSION:  Successful placement of a right internal jugular approach single lumen power injectable Port-A-Cath.  The catheter is ready for immediate use.  Original Report Authenticated By: Waynard Reeds, M.D.   Ir US Guide Vasc Access Right  07/26/2011  *RADIOLOGY REPORT*  Indication: Rectal cancer, post surgery, initiating chemotherapy  IMPLANTED PORT A CATH PLACEMENT WITH ULTRASOUND AND FLUOROSCOPIC GUIDANCE  Sedation: Versed 3 mg IV; Fentanyl 200 mcg IV; Ancef 1 gm IV; IV antibiotic was given in an appropriate time interval prior to skin puncture.  Total Moderate Sedation Time: 25 minutes.  Contrast: None  Fluoroscopy Time: 0.4 minutes.  Complications: None immediate  Procedure:  The procedure, risks, benefits, and alternatives were explained to the patient.  Questions regarding the procedure were encouraged and answered.  The patient understands and consents to the procedure.  The right neck and chest were prepped with chlorhexidine in a sterile fashion, and a sterile drape was applied covering the operative field.  Maximum barrier sterile technique with sterile gowns and gloves were used for the procedure.  A timeout was performed prior to the initiation of the procedure.  Local anesthesia was provided with 1% lidocaine with epinephrine.  After creating a small venotomy incision, a micropuncture kit was utilized to access the right internal jugular vein under direct, real-time ultrasound guidance.  Ultrasound image documentation was performed.  The microwire was kinked to measure appropriate catheter length.  A subcutaneous port pocket was then created  along the upper chest wall utilizing a combination of sharp and blunt dissection.  The pocket was irrigated with sterile saline.  A single lumen power injectable port was chosen for placement.  The 8 Fr catheter was tunneled from the port pocket site to the venotomy incision.  The port was placed in the pocket.  The external catheter was trimmed to appropriate length.  At the venotomy, an 8 Fr peel-away sheath was placed over a guidewire under fluoroscopic guidance.  The catheter was then placed through the sheath and the sheath was removed.  Final catheter positioning was confirmed and documented with a fluoroscopic spot radiograph.  The port was accessed with a needle and aspirated and flushed with heparinized saline.  The venotomy and port pocket incisions were closed with subcutaneous 2-0 Vicryl and a running subcuticular 4-0 Vicryl. Dermabond and Steri-strips were applied to both incisions. Dressings were placed.  The patient tolerated the procedure well without immediate post procedural complication.  Findings:  After catheter placement, the tip lies at the superior aspect of the right atrium.  The catheter aspirates normally and is ready for immediate use.  IMPRESSION:  Successful placement of a right internal jugular approach single lumen power injectable Port-A-Cath.  The catheter is ready for immediate use.  Original Report Authenticated By: Judene Companion.D.  Medications: I have reviewed the patient's current medications.  Assessment/Plan:  1. 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 She completed cycle #1 FOLFOX 07/26/2011. 2. Necrosis of the stoma, status post an ileocecectomy with transverse colostomy 05/27/2011.  3. Open abdominal wound-the wound continues to heal.  4. History of a DVT while pregnant in 1979.  5. Normal preoperative CEA (1.0 on  05/14/2011).  6. Family history of cancer including endometrial cancer in her mother and colon cancer in her sister. The tumor was submitted for microsatellite instability testing. The tumor showed no evidence of microsatellite instability by PCR.  Disposition-Ms. Zara appears stable. She tolerated the first cycle of FOLFOX well overall. She is scheduled to return for cycle 2 on 08/09/2011. Due to transportation she needs to be treated on a Thursday/ Saturday schedule. We will schedule cycle 2 08/12/2011. She will return for a followup visit and cycle 3 08/26/2011. She will contact the office in the interim with any problems.    Lonna Cobb ANP/GNP-BC

## 2011-08-07 ENCOUNTER — Encounter

## 2011-08-09 ENCOUNTER — Inpatient Hospital Stay

## 2011-08-10 ENCOUNTER — Telehealth: Payer: Self-pay | Admitting: Oncology

## 2011-08-10 NOTE — Telephone Encounter (Signed)
lmonvm adviising the pt of her appts for 08/12/2011 and to pick up her appt calendars at that time.

## 2011-08-11 ENCOUNTER — Encounter

## 2011-08-12 ENCOUNTER — Ambulatory Visit (HOSPITAL_BASED_OUTPATIENT_CLINIC_OR_DEPARTMENT_OTHER)

## 2011-08-12 ENCOUNTER — Other Ambulatory Visit: Admitting: Lab

## 2011-08-12 VITALS — BP 163/89 | HR 76 | Temp 97.7°F

## 2011-08-12 DIAGNOSIS — Z5111 Encounter for antineoplastic chemotherapy: Secondary | ICD-10-CM

## 2011-08-12 DIAGNOSIS — C189 Malignant neoplasm of colon, unspecified: Secondary | ICD-10-CM

## 2011-08-12 LAB — CBC WITH DIFFERENTIAL/PLATELET
Basophils Absolute: 0 10*3/uL (ref 0.0–0.1)
Eosinophils Absolute: 0.1 10*3/uL (ref 0.0–0.5)
HCT: 32.5 % — ABNORMAL LOW (ref 34.8–46.6)
HGB: 10 g/dL — ABNORMAL LOW (ref 11.6–15.9)
LYMPH%: 53.5 % — ABNORMAL HIGH (ref 14.0–49.7)
MCV: 78.3 fL — ABNORMAL LOW (ref 79.5–101.0)
MONO%: 10.5 % (ref 0.0–14.0)
NEUT#: 1.6 10*3/uL (ref 1.5–6.5)
NEUT%: 32.5 % — ABNORMAL LOW (ref 38.4–76.8)
Platelets: 201 10*3/uL (ref 145–400)
RBC: 4.15 10*6/uL (ref 3.70–5.45)

## 2011-08-12 LAB — COMPREHENSIVE METABOLIC PANEL
BUN: 9 mg/dL (ref 6–23)
CO2: 26 mEq/L (ref 19–32)
Calcium: 9.3 mg/dL (ref 8.4–10.5)
Chloride: 102 mEq/L (ref 96–112)
Creatinine, Ser: 0.81 mg/dL (ref 0.50–1.10)
Glucose, Bld: 96 mg/dL (ref 70–99)
Total Bilirubin: 0.3 mg/dL (ref 0.3–1.2)

## 2011-08-12 MED ORDER — OXALIPLATIN CHEMO INJECTION 100 MG/20ML
85.0000 mg/m2 | Freq: Once | INTRAVENOUS | Status: AC
  Administered 2011-08-12: 180 mg via INTRAVENOUS
  Filled 2011-08-12: qty 36

## 2011-08-12 MED ORDER — DEXAMETHASONE SODIUM PHOSPHATE 10 MG/ML IJ SOLN
10.0000 mg | Freq: Once | INTRAMUSCULAR | Status: AC
  Administered 2011-08-12: 10 mg via INTRAVENOUS

## 2011-08-12 MED ORDER — SODIUM CHLORIDE 0.9 % IV SOLN
2400.0000 mg/m2 | INTRAVENOUS | Status: DC
  Administered 2011-08-12: 5150 mg via INTRAVENOUS
  Filled 2011-08-12: qty 103

## 2011-08-12 MED ORDER — FLUOROURACIL CHEMO INJECTION 2.5 GM/50ML
400.0000 mg/m2 | Freq: Once | INTRAVENOUS | Status: AC
  Administered 2011-08-12: 850 mg via INTRAVENOUS
  Filled 2011-08-12: qty 17

## 2011-08-12 MED ORDER — ONDANSETRON 8 MG/50ML IVPB (CHCC)
8.0000 mg | Freq: Once | INTRAVENOUS | Status: AC
  Administered 2011-08-12: 8 mg via INTRAVENOUS

## 2011-08-12 MED ORDER — DEXTROSE 5 % IV SOLN
Freq: Once | INTRAVENOUS | Status: AC
  Administered 2011-08-12: 12:00:00 via INTRAVENOUS

## 2011-08-12 MED ORDER — LEUCOVORIN CALCIUM INJECTION 350 MG
400.0000 mg/m2 | Freq: Once | INTRAVENOUS | Status: AC
  Administered 2011-08-12: 856 mg via INTRAVENOUS
  Filled 2011-08-12: qty 42.8

## 2011-08-13 ENCOUNTER — Other Ambulatory Visit: Payer: Self-pay | Admitting: Oncology

## 2011-08-14 ENCOUNTER — Ambulatory Visit (HOSPITAL_BASED_OUTPATIENT_CLINIC_OR_DEPARTMENT_OTHER)

## 2011-08-14 VITALS — BP 138/84 | HR 78 | Temp 98.1°F

## 2011-08-14 DIAGNOSIS — C20 Malignant neoplasm of rectum: Secondary | ICD-10-CM

## 2011-08-14 DIAGNOSIS — C189 Malignant neoplasm of colon, unspecified: Secondary | ICD-10-CM

## 2011-08-14 MED ORDER — HEPARIN SOD (PORK) LOCK FLUSH 100 UNIT/ML IV SOLN
500.0000 [IU] | Freq: Once | INTRAVENOUS | Status: AC | PRN
  Administered 2011-08-14: 500 [IU]
  Filled 2011-08-14: qty 5

## 2011-08-14 MED ORDER — SODIUM CHLORIDE 0.9 % IJ SOLN
10.0000 mL | INTRAMUSCULAR | Status: DC | PRN
  Administered 2011-08-14: 10 mL
  Filled 2011-08-14: qty 10

## 2011-08-19 ENCOUNTER — Ambulatory Visit (INDEPENDENT_AMBULATORY_CARE_PROVIDER_SITE_OTHER): Admitting: General Surgery

## 2011-08-19 ENCOUNTER — Encounter (INDEPENDENT_AMBULATORY_CARE_PROVIDER_SITE_OTHER): Payer: Self-pay | Admitting: General Surgery

## 2011-08-19 ENCOUNTER — Other Ambulatory Visit: Admitting: Lab

## 2011-08-19 ENCOUNTER — Ambulatory Visit: Admitting: Oncology

## 2011-08-19 ENCOUNTER — Inpatient Hospital Stay

## 2011-08-19 VITALS — BP 144/88 | HR 125 | Temp 99.2°F | Ht 60.0 in | Wt 233.8 lb

## 2011-08-19 DIAGNOSIS — Z09 Encounter for follow-up examination after completed treatment for conditions other than malignant neoplasm: Secondary | ICD-10-CM

## 2011-08-19 NOTE — Progress Notes (Signed)
Subjective:     Patient ID: Marcellina Millin, female   DOB: 08-23-1950, 61 y.o.   MRN: [id number]  HPI This is a 61 year old female who I did a very difficult lower anterior resection for node-positive rectal cancer. Her diverting ileostomy became necrotic and I took her back to the operating room and she now has a transverse colostomy. She's done very well since then and is now undergoing chemotherapy. She reports no real problems. She has passed some gas and mucous and a small amount of stool per rectum.  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 I am very happy with her progress. She did ask when her colostomy would be taken down I told her that this would occur after she received all of her other therapy. Her ostomy is somewhat prolapsed but I think it is actually good at the degree it is right now is easy to take care of. If this continues to prolapse we will have to have a discussion about what to do if anything. She is going to continue her chemotherapy I will plan on seeing her in about 6 months if I need anything sooner.

## 2011-08-21 ENCOUNTER — Encounter

## 2011-08-25 ENCOUNTER — Other Ambulatory Visit: Payer: Self-pay | Admitting: Oncology

## 2011-08-26 ENCOUNTER — Telehealth: Payer: Self-pay | Admitting: Oncology

## 2011-08-26 ENCOUNTER — Ambulatory Visit: Admitting: Oncology

## 2011-08-26 ENCOUNTER — Other Ambulatory Visit: Admitting: Lab

## 2011-08-26 ENCOUNTER — Ambulatory Visit (HOSPITAL_BASED_OUTPATIENT_CLINIC_OR_DEPARTMENT_OTHER)

## 2011-08-26 VITALS — BP 149/84 | HR 122 | Temp 98.1°F | Ht 60.0 in | Wt 236.6 lb

## 2011-08-26 DIAGNOSIS — Z5111 Encounter for antineoplastic chemotherapy: Secondary | ICD-10-CM

## 2011-08-26 DIAGNOSIS — C189 Malignant neoplasm of colon, unspecified: Secondary | ICD-10-CM

## 2011-08-26 DIAGNOSIS — R0609 Other forms of dyspnea: Secondary | ICD-10-CM

## 2011-08-26 LAB — CBC WITH DIFFERENTIAL/PLATELET
BASO%: 0.5 % (ref 0.0–2.0)
Eosinophils Absolute: 0.1 10*3/uL (ref 0.0–0.5)
HCT: 33.7 % — ABNORMAL LOW (ref 34.8–46.6)
LYMPH%: 41.2 % (ref 14.0–49.7)
MONO#: 0.8 10*3/uL (ref 0.1–0.9)
NEUT#: 2.9 10*3/uL (ref 1.5–6.5)
NEUT%: 44.2 % (ref 38.4–76.8)
Platelets: 109 10*3/uL — ABNORMAL LOW (ref 145–400)
RBC: 4.26 10*6/uL (ref 3.70–5.45)
WBC: 6.5 10*3/uL (ref 3.9–10.3)
lymph#: 2.7 10*3/uL (ref 0.9–3.3)
nRBC: 0 % (ref 0–0)

## 2011-08-26 LAB — COMPREHENSIVE METABOLIC PANEL
ALT: 36 U/L — ABNORMAL HIGH (ref 0–35)
AST: 25 U/L (ref 0–37)
Albumin: 3.8 g/dL (ref 3.5–5.2)
CO2: 25 mEq/L (ref 19–32)
Calcium: 9.5 mg/dL (ref 8.4–10.5)
Chloride: 105 mEq/L (ref 96–112)
Creatinine, Ser: 0.94 mg/dL (ref 0.50–1.10)
Potassium: 3.6 mEq/L (ref 3.5–5.3)

## 2011-08-26 MED ORDER — SODIUM CHLORIDE 0.9 % IV SOLN
2400.0000 mg/m2 | INTRAVENOUS | Status: DC
  Administered 2011-08-26: 5150 mg via INTRAVENOUS
  Filled 2011-08-26: qty 103

## 2011-08-26 MED ORDER — INFLUENZA VIRUS VACC SPLIT PF IM SUSP
0.5000 mL | Freq: Once | INTRAMUSCULAR | Status: DC
  Filled 2011-08-26: qty 0.5

## 2011-08-26 MED ORDER — DEXAMETHASONE SODIUM PHOSPHATE 10 MG/ML IJ SOLN
10.0000 mg | Freq: Once | INTRAMUSCULAR | Status: AC
  Administered 2011-08-26: 10 mg via INTRAVENOUS

## 2011-08-26 MED ORDER — OXYCODONE-ACETAMINOPHEN 5-325 MG PO TABS: 1.0000 | ORAL_TABLET | ORAL | Status: AC | PRN

## 2011-08-26 MED ORDER — ONDANSETRON 8 MG/50ML IVPB (CHCC)
8.0000 mg | Freq: Once | INTRAVENOUS | Status: AC
  Administered 2011-08-26: 8 mg via INTRAVENOUS

## 2011-08-26 MED ORDER — LEUCOVORIN CALCIUM INJECTION 350 MG
400.0000 mg/m2 | Freq: Once | INTRAVENOUS | Status: AC
  Administered 2011-08-26: 856 mg via INTRAVENOUS
  Filled 2011-08-26: qty 42.8

## 2011-08-26 MED ORDER — FLUOROURACIL CHEMO INJECTION 2.5 GM/50ML
400.0000 mg/m2 | Freq: Once | INTRAVENOUS | Status: AC
  Administered 2011-08-26: 850 mg via INTRAVENOUS
  Filled 2011-08-26: qty 17

## 2011-08-26 MED ORDER — OXALIPLATIN CHEMO INJECTION 100 MG/20ML
85.0000 mg/m2 | Freq: Once | INTRAVENOUS | Status: AC
  Administered 2011-08-26: 180 mg via INTRAVENOUS
  Filled 2011-08-26: qty 36

## 2011-08-26 MED ORDER — DEXTROSE 5 % IV SOLN
Freq: Once | INTRAVENOUS | Status: AC
  Administered 2011-08-26: 11:00:00 via INTRAVENOUS

## 2011-08-26 NOTE — Progress Notes (Signed)
OFFICE PROGRESS NOTE   INTERVAL HISTORY:   She completed a second cycle of FOLFOX on 08/12/2011. She tolerated chemotherapy well. She denies nausea, mouth sores, diarrhea, and neuropathy symptoms. Cold sensitivity following chemotherapy has resolved. She reports mild exertional dyspnea over the past week. She denies cough, chest pain, and fever. The abdominal wound has healed. She takes Percocet for intermittent low abdominal cramps.  Objective:  Vital signs in last 24 hours:  Blood pressure 149/84, pulse 122, temperature 98.1 F (36.7 C), temperature source Oral, height 5' (1.524 m), weight 236 lb 9.6 oz (107.321 kg), last menstrual period 11/30/2009.    HEENT: Small amount of thrush of the right buccal mucosa. No ulcers. Resp: Lungs clear bilaterally, no respiratory distress Cardio: Regular rate and rhythm GI: No hepatomegaly, nontender, left abdominal colostomy with dark liquid stool Vascular: No leg edema  Skin: The abdominal wound has almost completely healed with a small remaining eschar at the upper portion of the wound   Portacath/PICC-without erythema  Lab Results:  Lab Results  Component Value Date   WBC 6.5 08/26/2011   HGB 10.3* 08/26/2011   HCT 33.7* 08/26/2011   MCV 79.1* 08/26/2011   PLT 109* 08/26/2011   ANC 2.9    Medications: I have reviewed the patient's current medications.  Assessment/Plan: 1. 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 She completed cycle #2 of adjuvant FOLFOX 08/12/2011.  2. Necrosis of the stoma, status post an ileocecectomy with transverse colostomy 05/27/2011.  3. Open abdominal wound-the wound has healed 4. History of a DVT while pregnant in 1979.  5. Normal preoperative CEA (1.0 on 05/14/2011).  6. Family history of cancer including endometrial cancer in her mother and colon cancer in her sister. The tumor  was submitted for microsatellite instability testing. The tumor showed no evidence of microsatellite instability by PCR 7. Exertional dyspnea-? Related to deconditioning. I have a low clinical suspicion for a pulmonary embolism. She will contact us for increased dyspnea or new symptoms.   Disposition:  She has completed 2 cycles of adjuvant FOLFOX chemotherapy. She has tolerated the chemotherapy well. The plan is to proceed with cycle 3 today. She will return for an office visit and chemotherapy in 2 weeks. Jasmine Clayton will receive an influenza vaccine today.   Lucile Shutters, MD  08/26/2011  12:22 PM

## 2011-08-26 NOTE — Telephone Encounter (Signed)
appts made and printed for 2/21 lab md and tx and pump d/c on 2/23    aom

## 2011-08-28 ENCOUNTER — Ambulatory Visit (HOSPITAL_BASED_OUTPATIENT_CLINIC_OR_DEPARTMENT_OTHER)

## 2011-08-28 VITALS — BP 151/83 | HR 93 | Temp 97.6°F

## 2011-08-28 DIAGNOSIS — Z23 Encounter for immunization: Secondary | ICD-10-CM

## 2011-08-28 DIAGNOSIS — C189 Malignant neoplasm of colon, unspecified: Secondary | ICD-10-CM

## 2011-08-28 MED ORDER — SODIUM CHLORIDE 0.9 % IJ SOLN
10.0000 mL | INTRAMUSCULAR | Status: DC | PRN
  Administered 2011-08-28: 10 mL
  Filled 2011-08-28: qty 10

## 2011-08-28 MED ORDER — INFLUENZA VIRUS VACC SPLIT PF IM SUSP
0.5000 mL | INTRAMUSCULAR | Status: AC
  Administered 2011-08-28: 0.5 mL via INTRAMUSCULAR

## 2011-08-28 MED ORDER — HEPARIN SOD (PORK) LOCK FLUSH 100 UNIT/ML IV SOLN
500.0000 [IU] | Freq: Once | INTRAVENOUS | Status: AC | PRN
  Administered 2011-08-28: 500 [IU]
  Filled 2011-08-28: qty 5

## 2011-08-28 NOTE — Patient Instructions (Signed)
Pt discharged home.  Given written instructions on flu vaccine.

## 2011-08-30 ENCOUNTER — Other Ambulatory Visit: Payer: Self-pay | Admitting: Certified Registered Nurse Anesthetist

## 2011-09-07 ENCOUNTER — Other Ambulatory Visit: Payer: Self-pay | Admitting: Oncology

## 2011-09-09 ENCOUNTER — Ambulatory Visit (HOSPITAL_BASED_OUTPATIENT_CLINIC_OR_DEPARTMENT_OTHER)

## 2011-09-09 ENCOUNTER — Other Ambulatory Visit (HOSPITAL_BASED_OUTPATIENT_CLINIC_OR_DEPARTMENT_OTHER): Admitting: Lab

## 2011-09-09 ENCOUNTER — Ambulatory Visit (HOSPITAL_BASED_OUTPATIENT_CLINIC_OR_DEPARTMENT_OTHER): Admitting: Oncology

## 2011-09-09 VITALS — BP 120/79 | HR 77 | Temp 97.5°F | Ht 60.0 in | Wt 233.1 lb

## 2011-09-09 DIAGNOSIS — C20 Malignant neoplasm of rectum: Secondary | ICD-10-CM

## 2011-09-09 DIAGNOSIS — C189 Malignant neoplasm of colon, unspecified: Secondary | ICD-10-CM

## 2011-09-09 DIAGNOSIS — Z5111 Encounter for antineoplastic chemotherapy: Secondary | ICD-10-CM

## 2011-09-09 LAB — CBC WITH DIFFERENTIAL/PLATELET
BASO%: 0.4 % (ref 0.0–2.0)
EOS%: 1.3 % (ref 0.0–7.0)
HCT: 32.7 % — ABNORMAL LOW (ref 34.8–46.6)
MCH: 24.1 pg — ABNORMAL LOW (ref 25.1–34.0)
MCHC: 30.9 g/dL — ABNORMAL LOW (ref 31.5–36.0)
MONO#: 0.6 10*3/uL (ref 0.1–0.9)
RBC: 4.19 10*6/uL (ref 3.70–5.45)
RDW: 18.9 % — ABNORMAL HIGH (ref 11.2–14.5)
WBC: 5.4 10*3/uL (ref 3.9–10.3)
lymph#: 2.5 10*3/uL (ref 0.9–3.3)
nRBC: 0 % (ref 0–0)

## 2011-09-09 LAB — COMPREHENSIVE METABOLIC PANEL
ALT: 29 U/L (ref 0–35)
AST: 19 U/L (ref 0–37)
Albumin: 3.5 g/dL (ref 3.5–5.2)
Alkaline Phosphatase: 124 U/L — ABNORMAL HIGH (ref 39–117)
BUN: 8 mg/dL (ref 6–23)
Calcium: 9.4 mg/dL (ref 8.4–10.5)
Chloride: 107 mEq/L (ref 96–112)
Potassium: 3.4 mEq/L — ABNORMAL LOW (ref 3.5–5.3)
Sodium: 141 mEq/L (ref 135–145)
Total Protein: 6.8 g/dL (ref 6.0–8.3)

## 2011-09-09 MED ORDER — ONDANSETRON 8 MG/50ML IVPB (CHCC)
8.0000 mg | Freq: Once | INTRAVENOUS | Status: AC
  Administered 2011-09-09: 8 mg via INTRAVENOUS

## 2011-09-09 MED ORDER — DEXAMETHASONE SODIUM PHOSPHATE 10 MG/ML IJ SOLN
10.0000 mg | Freq: Once | INTRAMUSCULAR | Status: AC
  Administered 2011-09-09: 10 mg via INTRAVENOUS

## 2011-09-09 MED ORDER — LEUCOVORIN CALCIUM INJECTION 350 MG
400.0000 mg/m2 | Freq: Once | INTRAVENOUS | Status: AC
  Administered 2011-09-09: 856 mg via INTRAVENOUS
  Filled 2011-09-09: qty 42.8

## 2011-09-09 MED ORDER — FLUOROURACIL CHEMO INJECTION 2.5 GM/50ML
400.0000 mg/m2 | Freq: Once | INTRAVENOUS | Status: AC
  Administered 2011-09-09: 850 mg via INTRAVENOUS
  Filled 2011-09-09: qty 17

## 2011-09-09 MED ORDER — SODIUM CHLORIDE 0.9 % IV SOLN
2400.0000 mg/m2 | INTRAVENOUS | Status: DC
  Administered 2011-09-09: 5150 mg via INTRAVENOUS
  Filled 2011-09-09: qty 103

## 2011-09-09 MED ORDER — OXALIPLATIN CHEMO INJECTION 100 MG/20ML
85.0000 mg/m2 | Freq: Once | INTRAVENOUS | Status: AC
  Administered 2011-09-09: 180 mg via INTRAVENOUS
  Filled 2011-09-09: qty 36

## 2011-09-09 MED ORDER — DEXTROSE 5 % IV SOLN
Freq: Once | INTRAVENOUS | Status: AC
  Administered 2011-09-09: 10:00:00 via INTRAVENOUS

## 2011-09-09 MED ORDER — SODIUM CHLORIDE 0.9 % IJ SOLN
10.0000 mL | INTRAMUSCULAR | Status: DC | PRN
  Filled 2011-09-09: qty 10

## 2011-09-09 MED ORDER — HEPARIN SOD (PORK) LOCK FLUSH 100 UNIT/ML IV SOLN
500.0000 [IU] | Freq: Once | INTRAVENOUS | Status: DC | PRN
  Filled 2011-09-09: qty 5

## 2011-09-09 NOTE — Progress Notes (Signed)
OFFICE PROGRESS NOTE   INTERVAL HISTORY:   She completed another cycle of FOLFOX on 08/26/2011. She denies mouth sores, nausea, and diarrhea following chemotherapy. She had cold sensitivity after the chemotherapy. She denies neuropathy symptoms today.  Objective:  Vital signs in last 24 hours:  Blood pressure 120/79, pulse 77, temperature 97.5 F (36.4 C), temperature source Oral, height 5' (1.524 m), weight 233 lb 1.6 oz (105.733 kg), last menstrual period 11/30/2009.    HEENT: No thrush or ulcers Resp: Lungs clear bilaterally Cardio: Regular rate and rhythm GI: No hepatomegaly, left abdominal colostomy, healed midline incision Vascular: No leg edema    Portacath/PICC-without erythema  Lab Results:  Lab Results  Component Value Date   WBC 5.4 09/09/2011   HGB 10.1* 09/09/2011   HCT 32.7* 09/09/2011   MCV 78.0* 09/09/2011   PLT 116* 09/09/2011   ANC 2.3    Medications: I have reviewed the patient's current medications.  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 She completed cycle #3 of adjuvant FOLFOX 08/26/2011. 2. Necrosis of the stoma, status post an ileocecectomy with transverse colostomy 05/27/2011.  3. Open abdominal wound-the wound has healed  4. History of a DVT while pregnant in 1979.  5. Normal preoperative CEA (1.0 on 05/14/2011).  6. Family history of cancer including endometrial cancer in her mother and colon cancer in her sister. The tumor was submitted for microsatellite instability testing. The tumor showed no evidence of microsatellite instability by PCR    Disposition:  She has tolerated the chemotherapy well today. She'll complete cycle 4 of FOLFOX today. She will return for an office visit and chemotherapy in 2 weeks.   Lucile Shutters, MD  09/09/2011  1:02 PM

## 2011-09-11 ENCOUNTER — Ambulatory Visit (HOSPITAL_BASED_OUTPATIENT_CLINIC_OR_DEPARTMENT_OTHER)

## 2011-09-11 VITALS — BP 133/76 | HR 75 | Temp 97.7°F

## 2011-09-11 DIAGNOSIS — C189 Malignant neoplasm of colon, unspecified: Secondary | ICD-10-CM

## 2011-09-11 MED ORDER — HEPARIN SOD (PORK) LOCK FLUSH 100 UNIT/ML IV SOLN
500.0000 [IU] | Freq: Once | INTRAVENOUS | Status: AC | PRN
  Administered 2011-09-11: 500 [IU]
  Filled 2011-09-11: qty 5

## 2011-09-11 MED ORDER — SODIUM CHLORIDE 0.9 % IJ SOLN
10.0000 mL | INTRAMUSCULAR | Status: DC | PRN
  Administered 2011-09-11: 10 mL
  Filled 2011-09-11: qty 10

## 2011-09-11 NOTE — Patient Instructions (Signed)
Pt aware of appts, no complaints.  SLJ

## 2011-09-13 ENCOUNTER — Ambulatory Visit (INDEPENDENT_AMBULATORY_CARE_PROVIDER_SITE_OTHER): Admitting: Family

## 2011-09-13 ENCOUNTER — Encounter: Payer: Self-pay | Admitting: Family

## 2011-09-13 VITALS — BP 140/78 | HR 83 | Temp 98.3°F | Resp 15 | Ht 60.0 in | Wt 232.1 lb

## 2011-09-13 DIAGNOSIS — C189 Malignant neoplasm of colon, unspecified: Secondary | ICD-10-CM

## 2011-09-13 DIAGNOSIS — H547 Unspecified visual loss: Secondary | ICD-10-CM

## 2011-09-13 DIAGNOSIS — R030 Elevated blood-pressure reading, without diagnosis of hypertension: Secondary | ICD-10-CM

## 2011-09-13 NOTE — Assessment & Plan Note (Signed)
Her spirits are good, feels well.  She continues her treatment with Dr. Truett Perna.  She now has a colostomy.

## 2011-09-13 NOTE — Patient Instructions (Signed)
Please schedule your physical in the end of august.  (come fasting to this appointment) You will be contacted about your referral to the optometrist.

## 2011-09-13 NOTE — Assessment & Plan Note (Signed)
She is requesting referral for an eye exam.  Will arrange.

## 2011-09-13 NOTE — Progress Notes (Signed)
Subjective:    Patient ID: Jasmine Clayton, female    DOB: February 22, 1951, 61 y.o.   MRN: [id number]  HPI  Jasmine Clayton is a 61 yr old female who presents today for her 6 month follow up.  Colon Cancer- Last visit she was evaluated for blood in stool.  She had colo which was + for adenocarcinoma.  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  This was followed by necrosis of the stoma, status post an ileocecectomy with transverse colostomy 05/27/2011. She is now undergoing chemotherapy under the direction of Dr. Truett Perna.  Elevated blood pressure- watching her sodium.  Denies shortness of breath swelling, chest pain.    She reports that she just "tore her last contact." Requests referral for eye examination.  Vision has been bothering her.    BP Readings from Last 3 Encounters:  09/13/11 140/78  09/11/11 133/76  09/09/11 120/79   She is requesting referral to eye doctor.  Notes difficulty focusing.  Needs contacts.     Review of Systems Past Medical History  Diagnosis Date  . DVT (deep vein thrombosis) in pregnancy 1979    during pregnancy. lower extremity  . Stress incontinence, female   . Morbid obesity   . Degenerative joint disease   . Adenocarcinoma of colon 04/12/2011  . Ovarian tumor 12/27/2009    St IA low mal. potential L ovarian tumor c/w adenofibroma/endometriosis    History   Social History  . Marital Status: Married    Spouse Name: N/A    Number of Children: 1  . Years of Education: N/A   Occupational History  . 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 Smoking status: Never Smoker   . Smokeless tobacco: Never Used  . Alcohol Use: 3.5 oz/week    7 drink(s) per week  . Drug Use: No  . Sexually Active: Not on file   Other Topics Concern  . Not on file   Social History Narrative   Regular exercise:  NoCaffeine Use:  1 cup coffee dailyMarried- lives with husband.  Daughter, son-in-law and her 2 children.  Completed the 12th grade, and some college.    Past Surgical History  Procedure Date  . Total abdominal hysterectomy w/ bilateral salpingoophorectomy 11/30/09  . Total hip arthroplasty 2002    left hip  . Mass excision 2011    "mass removed from ovaries"  . Cesarean section   . Breast cyst excision   . Keloid excision 1982  . Joint replacement 2002    hip replacement  . Colon surgery     LAR, reoperation for necrotic ileostomy, transverse colostomy  . Colostomy     Family History  Problem Relation Age of Onset  . Cancer Mother 81    endometrial cancer  . Diabetes Father   . Colon cancer Sister   . Cancer Sister 39    colon cancer currently undergoing chemotherapy    No Known Allergies  Current Outpatient Prescriptions on File Prior to Visit  Medication Sig Dispense Refill  . aspirin 81 MG tablet Take 81 mg by mouth daily.        . B Complex Vitamins (B-COMPLEX/B-12 PO) Take 1 tablet by mouth daily.        . calcium-vitamin D (OSCAL) 250-125 MG-UNIT per tablet Take 1 tablet by mouth daily.       Marland Kitchen  fish oil-omega-3 fatty acids 1000 MG capsule Take 1 g by mouth daily.        Marland Kitchen lidocaine-prilocaine (EMLA) cream Apply topically as needed. Apply to [facility] 2 hours prior to stick and cover with plastic wrap  30 g  prn  . omeprazole (PRILOSEC OTC) 20 MG tablet Take 20 mg by mouth as needed.       Marland Kitchen tetrahydrozoline 0.05 % ophthalmic solution Place 1 drop into both eyes 2 (two) times daily as needed. For dry/irritated eyes ([place])       Current Facility-Administered Medications on File Prior to Visit  Medication Dose Route Frequency Provider Last Rate Last Dose  . fluorouracil (ADRUCIL) 5,150 mg in sodium chloride 0.9 % 150 mL chemo infusion  2,400 mg/m2 (Treatment Plan Actual) Intravenous 1 day or 1 dose Lucile Shutters, MD   5,150 mg at 08/12/11 1437     BP 140/78  Pulse 83  Temp(Src) 98.3 F (36.8 C) (Oral)  Resp 15  Ht 5' (1.524 m)  Wt 232 lb 1.3 oz (105.271 kg)  BMI 45.33 kg/m2  SpO2 98%  LMP 11/30/2009       Objective:   Physical Exam  Constitutional: She appears well-developed and well-nourished.  Cardiovascular: Normal rate and regular rhythm.   No murmur heard. Pulmonary/Chest: Effort normal and breath sounds normal. No respiratory distress. She has no wheezes. She has no rales. She exhibits no tenderness.  Psychiatric: She has a normal mood and affect. Her behavior is normal. Judgment and thought content normal.          Assessment & Plan:

## 2011-09-13 NOTE — Assessment & Plan Note (Signed)
BP Readings from Last 3 Encounters:  09/13/11 140/78  09/11/11 133/76  09/09/11 120/79  BP stable.  Continue low sodium diet.

## 2011-09-17 ENCOUNTER — Encounter: Payer: Self-pay | Admitting: *Deleted

## 2011-09-22 ENCOUNTER — Other Ambulatory Visit: Payer: Self-pay | Admitting: Oncology

## 2011-09-23 ENCOUNTER — Ambulatory Visit (HOSPITAL_BASED_OUTPATIENT_CLINIC_OR_DEPARTMENT_OTHER): Admitting: Nurse Practitioner

## 2011-09-23 ENCOUNTER — Other Ambulatory Visit: Payer: Self-pay | Admitting: *Deleted

## 2011-09-23 ENCOUNTER — Other Ambulatory Visit: Payer: Self-pay | Admitting: Nurse Practitioner

## 2011-09-23 ENCOUNTER — Ambulatory Visit (HOSPITAL_BASED_OUTPATIENT_CLINIC_OR_DEPARTMENT_OTHER)

## 2011-09-23 ENCOUNTER — Telehealth: Payer: Self-pay | Admitting: Oncology

## 2011-09-23 ENCOUNTER — Other Ambulatory Visit (HOSPITAL_BASED_OUTPATIENT_CLINIC_OR_DEPARTMENT_OTHER): Admitting: Lab

## 2011-09-23 VITALS — BP 152/84 | HR 70 | Temp 97.9°F | Ht 60.0 in | Wt 234.8 lb

## 2011-09-23 DIAGNOSIS — C189 Malignant neoplasm of colon, unspecified: Secondary | ICD-10-CM

## 2011-09-23 DIAGNOSIS — C20 Malignant neoplasm of rectum: Secondary | ICD-10-CM

## 2011-09-23 DIAGNOSIS — Z5111 Encounter for antineoplastic chemotherapy: Secondary | ICD-10-CM

## 2011-09-23 LAB — COMPREHENSIVE METABOLIC PANEL
ALT: 36 U/L — ABNORMAL HIGH (ref 0–35)
AST: 24 U/L (ref 0–37)
Albumin: 3.6 g/dL (ref 3.5–5.2)
Alkaline Phosphatase: 126 U/L — ABNORMAL HIGH (ref 39–117)
BUN: 10 mg/dL (ref 6–23)
CO2: 25 mEq/L (ref 19–32)
Calcium: 9.2 mg/dL (ref 8.4–10.5)
Chloride: 106 mEq/L (ref 96–112)
Creatinine, Ser: 0.9 mg/dL (ref 0.50–1.10)
Glucose, Bld: 112 mg/dL — ABNORMAL HIGH (ref 70–99)
Potassium: 3.2 mEq/L — ABNORMAL LOW (ref 3.5–5.3)
Sodium: 141 mEq/L (ref 135–145)
Total Bilirubin: 0.4 mg/dL (ref 0.3–1.2)
Total Protein: 6.6 g/dL (ref 6.0–8.3)

## 2011-09-23 LAB — CBC WITH DIFFERENTIAL/PLATELET
BASO%: 0.2 % (ref 0.0–2.0)
Eosinophils Absolute: 0.1 10*3/uL (ref 0.0–0.5)
LYMPH%: 39 % (ref 14.0–49.7)
MCHC: 31.3 g/dL — ABNORMAL LOW (ref 31.5–36.0)
MCV: 78.8 fL — ABNORMAL LOW (ref 79.5–101.0)
MONO%: 13.2 % (ref 0.0–14.0)
NEUT%: 45 % (ref 38.4–76.8)
Platelets: 87 10*3/uL — ABNORMAL LOW (ref 145–400)
RBC: 4.01 10*6/uL (ref 3.70–5.45)
nRBC: 0 % (ref 0–0)

## 2011-09-23 LAB — FERRITIN: Ferritin: 78 ng/mL (ref 10–291)

## 2011-09-23 MED ORDER — POTASSIUM CHLORIDE CRYS ER 20 MEQ PO TBCR: 20.0000 meq | EXTENDED_RELEASE_TABLET | Freq: Two times a day (BID) | ORAL | Status: DC

## 2011-09-23 MED ORDER — HEPARIN SOD (PORK) LOCK FLUSH 100 UNIT/ML IV SOLN
500.0000 [IU] | Freq: Once | INTRAVENOUS | Status: DC | PRN
  Filled 2011-09-23: qty 5

## 2011-09-23 MED ORDER — DEXTROSE 5 % IV SOLN
Freq: Once | INTRAVENOUS | Status: AC
  Administered 2011-09-23: 12:00:00 via INTRAVENOUS

## 2011-09-23 MED ORDER — DEXAMETHASONE SODIUM PHOSPHATE 10 MG/ML IJ SOLN
10.0000 mg | Freq: Once | INTRAMUSCULAR | Status: AC
  Administered 2011-09-23: 10 mg via INTRAVENOUS

## 2011-09-23 MED ORDER — ONDANSETRON 8 MG/50ML IVPB (CHCC)
8.0000 mg | Freq: Once | INTRAVENOUS | Status: AC
  Administered 2011-09-23: 8 mg via INTRAVENOUS

## 2011-09-23 MED ORDER — DEXTROSE 5 % IV SOLN
85.0000 mg/m2 | Freq: Once | INTRAVENOUS | Status: AC
  Administered 2011-09-23: 180 mg via INTRAVENOUS
  Filled 2011-09-23: qty 36

## 2011-09-23 MED ORDER — LEUCOVORIN CALCIUM INJECTION 350 MG
400.0000 mg/m2 | Freq: Once | INTRAVENOUS | Status: AC
  Administered 2011-09-23: 856 mg via INTRAVENOUS
  Filled 2011-09-23: qty 42.8

## 2011-09-23 MED ORDER — FLUOROURACIL CHEMO INJECTION 2.5 GM/50ML
400.0000 mg/m2 | Freq: Once | INTRAVENOUS | Status: AC
  Administered 2011-09-23: 850 mg via INTRAVENOUS
  Filled 2011-09-23: qty 17

## 2011-09-23 MED ORDER — SODIUM CHLORIDE 0.9 % IV SOLN
2400.0000 mg/m2 | INTRAVENOUS | Status: DC
  Administered 2011-09-23: 5150 mg via INTRAVENOUS
  Filled 2011-09-23: qty 103

## 2011-09-23 NOTE — Telephone Encounter (Signed)
Called pt: begin potassium one tab daily, per Dr. Truett Perna. She verbalized understanding.

## 2011-09-23 NOTE — Progress Notes (Signed)
OFFICE PROGRESS NOTE  Interval history:  Ms. Abbasi returns as scheduled. She completed cycle 4 FOLFOX 09/09/2011. She denies nausea/vomiting. No mouth sores. No diarrhea. She noted cold sensitivity lasting approximately 7 days. She denies persistent neuropathy symptoms.   Objective: Blood pressure 152/84, pulse 70, temperature 97.9 F (36.6 C), temperature source Oral, height 5' (1.524 m), weight 234 lb 12.8 oz (106.505 kg), last menstrual period 11/30/2009.  Oropharynx is without thrush or ulceration. Lungs are clear. Regular cardiac rhythm. Port-A-Cath site is without erythema. Abdomen is soft and nontender. No hepatomegaly. She has a colostomy. Trace bilateral pretibial edema. Vibratory sense is mildly decreased over the fingertips per tuning fork exam.  Lab Results: Lab Results  Component Value Date   WBC 4.6 09/23/2011   HGB 9.9* 09/23/2011   HCT 31.6* 09/23/2011   MCV 78.8* 09/23/2011   PLT 87* 09/23/2011    Chemistry:    Chemistry      Component Value Date/Time   NA 141 09/09/2011 0843   K 3.4* 09/09/2011 0843   CL 107 09/09/2011 0843   CO2 25 09/09/2011 0843   BUN 8 09/09/2011 0843   CREATININE 0.87 09/09/2011 0843   CREATININE 0.98 03/15/2011 0828      Component Value Date/Time   CALCIUM 9.4 09/09/2011 0843   ALKPHOS 124* 09/09/2011 0843   AST 19 09/09/2011 0843   ALT 29 09/09/2011 0843   BILITOT 0.4 09/09/2011 0843       Studies/Results: No results found.  Medications: I have reviewed the patient's current medications.  Assessment/Plan:  1. 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 She began adjuvant FOLFOX chemotherapy 07/26/2011. She completed cycle 4 09/09/2011. 2. 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 3. Open abdominal wound. The wound has healed. 4. History of a DVT while pregnant in 1979. 5. Normal  preoperative CEA (1.0 on 05/14/2011). 6. Family history of cancer including endometrial cancer in her mother and colon cancer in her sister. The tumor was submitted for microsatellite instability testing. The tumor showed no evidence of microsatellite instability by PCR. 7. Microcytic anemia. We are adding a ferritin to today's labs. 8. Thrombocytopenia secondary to chemotherapy. She will contact the office with bruising/bleeding.  Disposition-Ms. Kees appears stable. Plan to proceed with cycle 5 FOLFOX today as scheduled. She will return for an office visit and cycle 6 in 2 weeks. She will contact the office in the interim as outlined above or with any other problems.  Plan reviewed with Dr. Truett Perna.  Lonna Cobb ANP/GNP-BC

## 2011-09-23 NOTE — Telephone Encounter (Signed)
Gv pt appt for [id number] 

## 2011-09-25 ENCOUNTER — Ambulatory Visit (HOSPITAL_BASED_OUTPATIENT_CLINIC_OR_DEPARTMENT_OTHER)

## 2011-09-25 VITALS — BP 139/74 | HR 77 | Temp 97.0°F

## 2011-09-25 DIAGNOSIS — C189 Malignant neoplasm of colon, unspecified: Secondary | ICD-10-CM

## 2011-09-25 DIAGNOSIS — Z469 Encounter for fitting and adjustment of unspecified device: Secondary | ICD-10-CM

## 2011-09-25 MED ORDER — HEPARIN SOD (PORK) LOCK FLUSH 100 UNIT/ML IV SOLN
500.0000 [IU] | Freq: Once | INTRAVENOUS | Status: AC | PRN
  Administered 2011-09-25: 500 [IU]

## 2011-09-25 MED ORDER — SODIUM CHLORIDE 0.9 % IJ SOLN
10.0000 mL | INTRAMUSCULAR | Status: DC | PRN
  Administered 2011-09-25: 10 mL

## 2011-09-25 NOTE — Patient Instructions (Signed)
Patient aware of next appointment.

## 2011-10-05 ENCOUNTER — Other Ambulatory Visit: Payer: Self-pay | Admitting: Oncology

## 2011-10-07 ENCOUNTER — Ambulatory Visit (HOSPITAL_BASED_OUTPATIENT_CLINIC_OR_DEPARTMENT_OTHER): Admitting: Nurse Practitioner

## 2011-10-07 ENCOUNTER — Other Ambulatory Visit (HOSPITAL_BASED_OUTPATIENT_CLINIC_OR_DEPARTMENT_OTHER)

## 2011-10-07 ENCOUNTER — Ambulatory Visit (HOSPITAL_BASED_OUTPATIENT_CLINIC_OR_DEPARTMENT_OTHER)

## 2011-10-07 ENCOUNTER — Other Ambulatory Visit: Payer: Self-pay | Admitting: *Deleted

## 2011-10-07 ENCOUNTER — Ambulatory Visit

## 2011-10-07 VITALS — BP 117/72 | HR 74 | Temp 97.9°F | Ht 60.0 in | Wt 234.4 lb

## 2011-10-07 DIAGNOSIS — Z5111 Encounter for antineoplastic chemotherapy: Secondary | ICD-10-CM

## 2011-10-07 DIAGNOSIS — C189 Malignant neoplasm of colon, unspecified: Secondary | ICD-10-CM

## 2011-10-07 DIAGNOSIS — Z933 Colostomy status: Secondary | ICD-10-CM

## 2011-10-07 DIAGNOSIS — C20 Malignant neoplasm of rectum: Secondary | ICD-10-CM

## 2011-10-07 DIAGNOSIS — Z09 Encounter for follow-up examination after completed treatment for conditions other than malignant neoplasm: Secondary | ICD-10-CM

## 2011-10-07 LAB — COMPREHENSIVE METABOLIC PANEL
Albumin: 3.6 g/dL (ref 3.5–5.2)
BUN: 10 mg/dL (ref 6–23)
CO2: 26 mEq/L (ref 19–32)
Glucose, Bld: 105 mg/dL — ABNORMAL HIGH (ref 70–99)
Sodium: 139 mEq/L (ref 135–145)
Total Bilirubin: 0.3 mg/dL (ref 0.3–1.2)
Total Protein: 7.1 g/dL (ref 6.0–8.3)

## 2011-10-07 LAB — CBC WITH DIFFERENTIAL/PLATELET
Basophils Absolute: 0 10*3/uL (ref 0.0–0.1)
Eosinophils Absolute: 0.1 10*3/uL (ref 0.0–0.5)
HCT: 32.4 % — ABNORMAL LOW (ref 34.8–46.6)
HGB: 10.3 g/dL — ABNORMAL LOW (ref 11.6–15.9)
MCV: 79.6 fL (ref 79.5–101.0)
MONO%: 16.5 % — ABNORMAL HIGH (ref 0.0–14.0)
NEUT#: 1.8 10*3/uL (ref 1.5–6.5)
NEUT%: 36.9 % — ABNORMAL LOW (ref 38.4–76.8)
Platelets: 83 10*3/uL — ABNORMAL LOW (ref 145–400)
RDW: 22.3 % — ABNORMAL HIGH (ref 11.2–14.5)

## 2011-10-07 MED ORDER — ALTEPLASE 2 MG IJ SOLR
2.0000 mg | Freq: Once | INTRAMUSCULAR | Status: DC | PRN
  Filled 2011-10-07: qty 2

## 2011-10-07 MED ORDER — OXALIPLATIN CHEMO INJECTION 100 MG/20ML
85.0000 mg/m2 | Freq: Once | INTRAVENOUS | Status: AC
  Administered 2011-10-07: 180 mg via INTRAVENOUS
  Filled 2011-10-07: qty 36

## 2011-10-07 MED ORDER — LEUCOVORIN CALCIUM INJECTION 350 MG
400.0000 mg/m2 | Freq: Once | INTRAVENOUS | Status: AC
  Administered 2011-10-07: 856 mg via INTRAVENOUS
  Filled 2011-10-07: qty 42.8

## 2011-10-07 MED ORDER — POTASSIUM CHLORIDE CRYS ER 20 MEQ PO TBCR: 20.0000 meq | EXTENDED_RELEASE_TABLET | Freq: Two times a day (BID) | ORAL | Status: DC

## 2011-10-07 MED ORDER — DEXAMETHASONE SODIUM PHOSPHATE 10 MG/ML IJ SOLN: 10.0000 mg | Freq: Once | INTRAMUSCULAR | Status: DC

## 2011-10-07 MED ORDER — DEXTROSE 5 % IV SOLN: Freq: Once | INTRAVENOUS | Status: DC

## 2011-10-07 MED ORDER — HEPARIN SOD (PORK) LOCK FLUSH 100 UNIT/ML IV SOLN
250.0000 [IU] | Freq: Once | INTRAVENOUS | Status: DC | PRN
  Filled 2011-10-07: qty 5

## 2011-10-07 MED ORDER — FLUOROURACIL CHEMO INJECTION 2.5 GM/50ML
400.0000 mg/m2 | Freq: Once | INTRAVENOUS | Status: AC
  Administered 2011-10-07: 850 mg via INTRAVENOUS
  Filled 2011-10-07: qty 17

## 2011-10-07 MED ORDER — SODIUM CHLORIDE 0.9 % IJ SOLN
3.0000 mL | INTRAMUSCULAR | Status: DC | PRN
  Filled 2011-10-07: qty 10

## 2011-10-07 MED ORDER — ONDANSETRON 8 MG/50ML IVPB (CHCC): 8.0000 mg | Freq: Once | INTRAVENOUS | Status: DC

## 2011-10-07 MED ORDER — SODIUM CHLORIDE 0.9 % IJ SOLN
10.0000 mL | INTRAMUSCULAR | Status: DC | PRN
  Filled 2011-10-07: qty 10

## 2011-10-07 MED ORDER — SODIUM CHLORIDE 0.9 % IV SOLN
2400.0000 mg/m2 | INTRAVENOUS | Status: DC
  Administered 2011-10-07: 5150 mg via INTRAVENOUS
  Filled 2011-10-07: qty 103

## 2011-10-07 MED ORDER — HEPARIN SOD (PORK) LOCK FLUSH 100 UNIT/ML IV SOLN
500.0000 [IU] | Freq: Once | INTRAVENOUS | Status: DC | PRN
Start: 2011-10-07 — End: 2011-10-07
  Filled 2011-10-07: qty 5

## 2011-10-07 NOTE — Progress Notes (Signed)
OFFICE PROGRESS NOTE  Interval history:  Jasmine Clayton returns as scheduled. She completed cycle 5 FOLFOX 09/23/2011. She denies nausea/vomiting. No mouth sores. No diarrhea. She intermittently notes cold sensitivity. She denies neuropathy symptoms in the absence of cold exposure.   Objective: Blood pressure 117/72, pulse 74, temperature 97.9 F (36.6 C), temperature source Oral, height 5' (1.524 m), weight 234 lb 6.4 oz (106.323 kg), last menstrual period 11/30/2009.  Oropharynx is without thrush or ulceration. Lungs are clear. Regular cardiac rhythm. Port-A-Cath site is without erythema. Abdomen is soft and nontender. No hepatomegaly. Colostomy noted. No stool in the collection bag. Extremities without edema. Vibratory sense normal over the fingertips per tuning fork exam.  Lab Results: Lab Results  Component Value Date   WBC 5.0 10/07/2011   HGB 10.3* 10/07/2011   HCT 32.4* 10/07/2011   MCV 79.6 10/07/2011   PLT 83* 10/07/2011    Chemistry:    Chemistry      Component Value Date/Time   NA 141 09/23/2011 0923   K 3.2* 09/23/2011 0923   CL 106 09/23/2011 0923   CO2 25 09/23/2011 0923   BUN 10 09/23/2011 0923   CREATININE 0.90 09/23/2011 0923   CREATININE 0.98 03/15/2011 0828      Component Value Date/Time   CALCIUM 9.2 09/23/2011 0923   ALKPHOS 126* 09/23/2011 0923   AST 24 09/23/2011 0923   ALT 36* 09/23/2011 0923   BILITOT 0.4 09/23/2011 0923       Studies/Results: No results found.  Medications: I have reviewed the patient's current medications.  Assessment/Plan:  1. 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 She began adjuvant FOLFOX chemotherapy 07/26/2011. She completed cycle 5 09/23/2011. 2. 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 3. Open abdominal wound. The wound has healed. 4. History of a DVT while  pregnant in 1979. 5. Normal preoperative CEA (1.0 on 05/14/2011). 6. Family history of cancer including endometrial cancer in her mother and colon cancer in her sister. The tumor was submitted for microsatellite instability testing. The tumor showed no evidence of microsatellite instability by PCR. 7. Microcytic anemia. Ferritin was normal at 78 on 09/23/2011. 8. Thrombocytopenia secondary to chemotherapy. She will contact the office with bruising/bleeding.  Disposition-Ms. Sellen appears stable. Plan to proceed with cycle 6 FOLFOX today as scheduled. We are referring her back to Dr. Dayton Scrape for adjuvant radiation. The radiation will be given with continuous infusion 5 fluorouracil. We reviewed potential toxicities associated with 5-fluorouracil including mouth sores, nausea, diarrhea, conjunctivitis, skin hyperpigmentation, and hand-foot syndrome. We are anticipating that she will begin radiation on 10/25/2011. Dr. Truett Perna will see her in followup on 10/25/2011. She will contact the office the interim with any problems.  Plan discussed with Dr. Truett Perna.  Jasmine Clayton ANP/GNP-BC

## 2011-10-08 ENCOUNTER — Encounter: Payer: Self-pay | Admitting: *Deleted

## 2011-10-09 ENCOUNTER — Ambulatory Visit (HOSPITAL_BASED_OUTPATIENT_CLINIC_OR_DEPARTMENT_OTHER)

## 2011-10-09 VITALS — BP 124/77 | HR 85 | Temp 98.9°F

## 2011-10-09 DIAGNOSIS — C189 Malignant neoplasm of colon, unspecified: Secondary | ICD-10-CM

## 2011-10-09 MED ORDER — SODIUM CHLORIDE 0.9 % IJ SOLN
10.0000 mL | INTRAMUSCULAR | Status: DC | PRN
  Administered 2011-10-09: 10 mL
  Filled 2011-10-09: qty 10

## 2011-10-09 MED ORDER — HEPARIN SOD (PORK) LOCK FLUSH 100 UNIT/ML IV SOLN
500.0000 [IU] | Freq: Once | INTRAVENOUS | Status: AC | PRN
  Administered 2011-10-09: 500 [IU]
  Filled 2011-10-09: qty 5

## 2011-10-12 ENCOUNTER — Ambulatory Visit
Admission: RE | Admit: 2011-10-12 | Discharge: 2011-10-12 | Disposition: A | Discharge status: Home / Self Care | Source: Ambulatory Visit | Attending: Radiation Oncology | Admitting: Radiation Oncology

## 2011-10-12 ENCOUNTER — Encounter: Payer: Self-pay | Admitting: Radiation Oncology

## 2011-10-12 VITALS — BP 147/92 | HR 93 | Temp 97.3°F | Resp 18 | Ht 60.0 in | Wt 231.8 lb

## 2011-10-12 DIAGNOSIS — Z96649 Presence of unspecified artificial hip joint: Secondary | ICD-10-CM | POA: Insufficient documentation

## 2011-10-12 DIAGNOSIS — C20 Malignant neoplasm of rectum: Secondary | ICD-10-CM | POA: Insufficient documentation

## 2011-10-12 DIAGNOSIS — C189 Malignant neoplasm of colon, unspecified: Secondary | ICD-10-CM

## 2011-10-12 DIAGNOSIS — Z933 Colostomy status: Secondary | ICD-10-CM | POA: Insufficient documentation

## 2011-10-12 DIAGNOSIS — Z85038 Personal history of other malignant neoplasm of large intestine: Secondary | ICD-10-CM | POA: Insufficient documentation

## 2011-10-12 DIAGNOSIS — Z90710 Acquired absence of both cervix and uterus: Secondary | ICD-10-CM | POA: Insufficient documentation

## 2011-10-12 DIAGNOSIS — Z9089 Acquired absence of other organs: Secondary | ICD-10-CM | POA: Insufficient documentation

## 2011-10-12 DIAGNOSIS — Z800 Family history of malignant neoplasm of digestive organs: Secondary | ICD-10-CM | POA: Insufficient documentation

## 2011-10-12 DIAGNOSIS — Z809 Family history of malignant neoplasm, unspecified: Secondary | ICD-10-CM | POA: Insufficient documentation

## 2011-10-12 DIAGNOSIS — Z86718 Personal history of other venous thrombosis and embolism: Secondary | ICD-10-CM | POA: Insufficient documentation

## 2011-10-12 NOTE — Progress Notes (Signed)
Complete PATIENT MEASURE OF DISTRESS worksheet with a score of 0 submitted to social work. Also, complete NUTRITION RISK SCREEN worksheet without concerns submitted to Barbara Neff, RD 

## 2011-10-12 NOTE — Progress Notes (Signed)
Patient presents to the clinic today unaccompanied for a follow up new consult with Dr. Dayton Scrape. Patient is alert and oriented to person, place, and time. No distress noted. Steady gait noted. Pleasant affect noted. Patient denies pain at this time. Patient denies nausea, vomiting, headache, dizziness or diarrhea. Patient reports "good" output from her colostomy. Patient reports the appearance of her stoma is "normal." Patient reports her adult daughter assist with colostomy care. Patient's weight stable. Patient has no complaints at this time. Reported all findings to Dr. Dayton Scrape.

## 2011-10-12 NOTE — Progress Notes (Signed)
Followup note:   Diagnosis: Stage III (T1, N1, M0) adenocarcinoma of the proximal rectum  History: Jasmine Clayton returns today for review and scheduling of her radiation therapy in the management of her stage III (T1, N1, M0) adenocarcinoma of the proximal rectum. I saw her in formal consultation on 06/29/2011. She presented with a moderately differentiated adenocarcinoma arising in a large tubulovillous adenoma. She underwent a low anterior resection on 05/19/2011 revealing a T1 invasive adenocarcinoma with 1 of 9 lymph nodes containing metastatic disease. She has completed 5 cycles of FOLFOX chemotherapy under the direction of Dr. Truett Perna. She is generally doing well. Her diverting colostomy is functioning well.  Physical examination:  Wt Readings from Last 3 Encounters:  10/12/11 231 lb 12.8 oz (105.144 kg)  10/07/11 234 lb 6.4 oz (106.323 kg)  09/23/11 234 lb 12.8 oz (106.505 kg)   Temp Readings from Last 3 Encounters:  10/12/11 97.3 F (36.3 C) Oral  10/09/11 98.9 F (37.2 C) Oral  10/07/11 97.9 F (36.6 C) Oral   BP Readings from Last 3 Encounters:  10/12/11 147/92  10/09/11 124/77  10/07/11 117/72   Pulse Readings from Last 3 Encounters:  10/12/11 93  10/09/11 85  10/07/11 74   Head and neck examination: Grossly unremarkable with the exception of a wig. Nodes: Without palpable cervical, supraclavicular, or inguinal lymphadenopathy. Chest: Lungs clear. Abdomen upper abdominal colostomy. The pelvic colon I can palpate the inferior aspect of her anastomosis. No masses are appreciated.  Laboratory data: Lab Results  Component Value Date   WBC 5.0 10/07/2011   HGB 10.3* 10/07/2011   HCT 32.4* 10/07/2011   MCV 79.6 10/07/2011   PLT 83* 10/07/2011   Impression: Stage III (T1, N1, M0) adenocarcinoma of the proximal rectum. We discussed the potential acute and late toxicities of chemoradiation and she wishes to proceed as outlined. NCCN guidelines suggest delivery of 4500 cGy in 25  sessions to her pelvis with consideration of a boost to the presacral/rectal bed. I understand that she is scheduled for initiation of chemoradiation on March 25, therefore I will have her return for treatment planning/simulation on March 18. Consent is signed today.  30 minutes was spent face-to-face with the patient, primarily counseling the patient.

## 2011-10-12 NOTE — Progress Notes (Signed)
See progress note from physician encounter.  

## 2011-10-13 NOTE — Progress Notes (Signed)
Encounter addended by: Agnes Lawrence, RN on: 10/13/2011  4:53 PM<BR>     Documentation filed: Charges VN, Inpatient Patient Education

## 2011-10-14 ENCOUNTER — Telehealth: Payer: Self-pay | Admitting: Oncology

## 2011-10-14 NOTE — Telephone Encounter (Signed)
lmonvm for pt re next appt for 3/25 @ 9:45 am. Pt to get new schedule 3/25.

## 2011-10-18 ENCOUNTER — Ambulatory Visit
Admission: RE | Admit: 2011-10-18 | Discharge: 2011-10-18 | Disposition: A | Discharge status: Home / Self Care | Source: Ambulatory Visit | Attending: Radiation Oncology | Admitting: Radiation Oncology

## 2011-10-18 DIAGNOSIS — C189 Malignant neoplasm of colon, unspecified: Secondary | ICD-10-CM

## 2011-10-18 NOTE — Progress Notes (Signed)
Simulation/treatment planning note  The patient was taken to the CT simulator. An alpha cradle was constructed with her legs in a frog-leg position. Her inguinal folds we taped. A vaginal swab with contrast was placed at the vaginal apex. A BB was placed along the anal verge. Her rectum and rectal tumor bed was contoured. The remainder of her avoidance structures were also contoured including her small bowel. She was setup to AP, PA, right lateral, and left lateral fields in 4 separate multileaf collimators were designed to conform the field. I prescribing 4500 cGy in 25 sessions utilizing 18 MV photons. This be followed by a tumor bed/presacral boost for a further 540 cGy in 3 sessions. Isodose plans and dosimetry are requested. She'll receive concomitant chemotherapy under the direction of Dr. Truett Perna and this constitutes a special treatment procedure. We can expect increased toxicity including diarrhea and pancytopenia from her combined modality therapy. We can also expect a significant skin reaction considering her skin folds. She'll be offered nutritional guidance.

## 2011-10-18 NOTE — Progress Notes (Signed)
Met with patient to discuss RO billing.  Patient had no concerns today. 

## 2011-10-21 ENCOUNTER — Inpatient Hospital Stay

## 2011-10-21 ENCOUNTER — Ambulatory Visit

## 2011-10-22 ENCOUNTER — Ambulatory Visit
Admission: RE | Admit: 2011-10-22 | Discharge: 2011-10-22 | Disposition: A | Discharge status: Home / Self Care | Source: Ambulatory Visit | Attending: Radiation Oncology | Admitting: Radiation Oncology

## 2011-10-23 ENCOUNTER — Encounter

## 2011-10-24 ENCOUNTER — Other Ambulatory Visit: Payer: Self-pay | Admitting: Oncology

## 2011-10-25 ENCOUNTER — Ambulatory Visit
Admission: RE | Admit: 2011-10-25 | Discharge: 2011-10-25 | Disposition: A | Discharge status: Home / Self Care | Source: Ambulatory Visit | Attending: Radiation Oncology | Admitting: Radiation Oncology

## 2011-10-25 ENCOUNTER — Encounter: Payer: Self-pay | Admitting: Radiation Oncology

## 2011-10-25 ENCOUNTER — Other Ambulatory Visit: Admitting: Lab

## 2011-10-25 ENCOUNTER — Telehealth: Payer: Self-pay | Admitting: Oncology

## 2011-10-25 ENCOUNTER — Ambulatory Visit: Admitting: Oncology

## 2011-10-25 ENCOUNTER — Ambulatory Visit (HOSPITAL_BASED_OUTPATIENT_CLINIC_OR_DEPARTMENT_OTHER)

## 2011-10-25 VITALS — BP 151/87 | HR 87 | Resp 18 | Wt 241.3 lb

## 2011-10-25 VITALS — BP 144/73 | HR 112 | Temp 98.1°F | Ht 60.0 in | Wt 236.7 lb

## 2011-10-25 DIAGNOSIS — C189 Malignant neoplasm of colon, unspecified: Secondary | ICD-10-CM

## 2011-10-25 DIAGNOSIS — Z5111 Encounter for antineoplastic chemotherapy: Secondary | ICD-10-CM

## 2011-10-25 DIAGNOSIS — I499 Cardiac arrhythmia, unspecified: Secondary | ICD-10-CM

## 2011-10-25 LAB — CBC WITH DIFFERENTIAL/PLATELET
Eosinophils Absolute: 0 10*3/uL (ref 0.0–0.5)
HGB: 10.8 g/dL — ABNORMAL LOW (ref 11.6–15.9)
MONO#: 0.8 10*3/uL (ref 0.1–0.9)
MONO%: 16.1 % — ABNORMAL HIGH (ref 0.0–14.0)
NEUT#: 1.8 10*3/uL (ref 1.5–6.5)
RBC: 4.2 10*6/uL (ref 3.70–5.45)
RDW: 24.2 % — ABNORMAL HIGH (ref 11.2–14.5)
WBC: 4.9 10*3/uL (ref 3.9–10.3)
lymph#: 2.3 10*3/uL (ref 0.9–3.3)
nRBC: 0 % (ref 0–0)

## 2011-10-25 LAB — TECHNOLOGIST REVIEW

## 2011-10-25 MED ORDER — SODIUM CHLORIDE 0.9 % IV SOLN
225.0000 mg/m2/d | INTRAVENOUS | Status: DC
  Administered 2011-10-25: 3300 mg via INTRAVENOUS
  Filled 2011-10-25: qty 66

## 2011-10-25 NOTE — Progress Notes (Signed)
Simulation verification note:  The patient underwent simulation verification for treatment to her pelvis on 10/22/2011. Her isocenter was in good position and the multileaf collimators contoured the treatment volume appropriately.

## 2011-10-25 NOTE — Progress Notes (Signed)
Weekly Management Note:  Site:Pelvis/rectum Current Dose:  180  cGy Projected Dose: 5040  cGy  Narrative: The patient is seen today for routine under treatment assessment. CBCT/MVCT images/port films were reviewed. The chart was reviewed.   She is without complaints today. Her infusion 5-FU begins today. CBC from earlier today is satisfactory.  Physical Examination:  Filed Vitals:   10/25/11 1627  BP: 151/87  Pulse: 87  Resp: 18  .  Weight: 241 lb 4.8 oz (109.453 kg). No change.  Lab Results  Component Value Date   WBC 4.9 10/25/2011   HGB 10.8* 10/25/2011   HCT 35.2 10/25/2011   MCV 83.8 10/25/2011   PLT 123* 10/25/2011    Impression: Tolerating radiation therapy well.  Plan: Continue radiation therapy as planned.

## 2011-10-25 NOTE — Telephone Encounter (Signed)
Gv pt appt for april2013 °

## 2011-10-25 NOTE — Progress Notes (Signed)
OFFICE PROGRESS NOTE   INTERVAL HISTORY:   She returns as scheduled. She feels well. She completed another cycle of FOLFOX on 10/07/2011. She denies nausea, mouth sores, diarrhea, and neuropathy symptoms.  Objective:  Vital signs in last 24 hours:  Blood pressure 144/73, pulse 112, temperature 98.1 F (36.7 C), temperature source Oral, height 5' (1.524 m), weight 236 lb 11.2 oz (107.366 kg), last menstrual period 11/30/2009.    HEENT: No thrush or ulcers Resp: Lungs clear bilaterally Cardio: Irregular GI: Nontender, no hepatomegaly, lower abdomen ostomy with brown stool. Healed midline wound. Vascular: No leg edema  Skin: Hyperpigmentation at the hands   Portacath/PICC-without erythema  Lab Results:  Lab Results  Component Value Date   WBC 4.9 10/25/2011   HGB 10.8* 10/25/2011   HCT 35.2 10/25/2011   MCV 83.8 10/25/2011   PLT 123* 10/25/2011   ANC 1.8    Medications: I have reviewed the patient's current medications.  Assessment/Plan: 1. 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 She began adjuvant FOLFOX chemotherapy 07/26/2011. She completed cycle 6 on 10/07/2011. 2. 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 3. Open abdominal wound. The wound has healed. 4. History of a DVT while pregnant in 1979. 5. Normal preoperative CEA (1.0 on 05/14/2011). 6. Family history of cancer including endometrial cancer in her mother and colon cancer in her sister. The tumor was submitted for microsatellite instability testing. The tumor showed no evidence of microsatellite instability by PCR. 7. Microcytic anemia. Ferritin was normal at 78 on 09/23/2011. Improved. 8. Thrombocytopenia secondary to chemotherapy. Improved 9. Irregular heart rate-we will obtain an EKG today.  Disposition:  She has completed 6 cycles of  adjuvant FOLFOX chemotherapy. She will begin concurrent radiation and infusional 5-FU today. The plan is to resume FOLFOX chemotherapy for 3 additional cycles at the completion of radiation.  I reviewed the potential toxicities associated with infusional 5-fluorouracil including a chance for mucositis, diarrhea, skin hyperpigmentation, and skin breakdown at the growing/perineum.  She will return for a weekly 5-FU pump exchange. She is scheduled for an office visit in 2 weeks.   Lucile Shutters, MD  10/25/2011  11:59 AM

## 2011-10-25 NOTE — Progress Notes (Signed)
Patient presents to the clinic today unaccompanied for under treat visit with Dr. Dayton Scrape. Patient is alert and oriented to person, place and time. No distress noted. Steady gait noted. Pleasant affect noted. Patient denies pain at this time. Patient has no complaints at this time. Reported all findings to Dr. Dayton Scrape.

## 2011-10-26 ENCOUNTER — Other Ambulatory Visit: Payer: Self-pay

## 2011-10-26 ENCOUNTER — Telehealth: Payer: Self-pay | Admitting: *Deleted

## 2011-10-26 ENCOUNTER — Ambulatory Visit
Admission: RE | Admit: 2011-10-26 | Discharge: 2011-10-26 | Disposition: A | Discharge status: Home / Self Care | Source: Ambulatory Visit | Attending: Radiation Oncology | Admitting: Radiation Oncology

## 2011-10-26 ENCOUNTER — Encounter

## 2011-10-26 ENCOUNTER — Other Ambulatory Visit: Payer: Self-pay | Admitting: *Deleted

## 2011-10-26 NOTE — Telephone Encounter (Signed)
Patient did not have EKG done on 3/25 as ordered. She agrees to have it done after radiation treatment today.

## 2011-10-26 NOTE — Progress Notes (Signed)
10/25/2011 Received patient in the clinic today unaccompanied for post sim education with Sam, RN. Patient is alert and oriented to person, place, and time. No distress noted. Steady gait noted. Pleasant affect noted. Patient denies pain at this time. Oriented patient to staff and routine of the clinic. Provided patient with RADIATION THERAPY AND YOU handbook then, reviewed pertinent information. Also, provided patient with sitz bath and reviewed use. Reviewed potential side effects and management. Provided patient with this writer's business card and encouraged to call with needs. Patient verbalized understanding of all things reviewed.

## 2011-10-27 ENCOUNTER — Encounter

## 2011-10-27 ENCOUNTER — Ambulatory Visit
Admission: RE | Admit: 2011-10-27 | Discharge: 2011-10-27 | Disposition: A | Discharge status: Home / Self Care | Source: Ambulatory Visit | Attending: Radiation Oncology | Admitting: Radiation Oncology

## 2011-10-28 ENCOUNTER — Encounter: Payer: Self-pay | Admitting: Radiation Oncology

## 2011-10-28 ENCOUNTER — Ambulatory Visit
Admission: RE | Admit: 2011-10-28 | Discharge: 2011-10-28 | Disposition: A | Discharge status: Home / Self Care | Source: Ambulatory Visit | Attending: Radiation Oncology | Admitting: Radiation Oncology

## 2011-10-28 NOTE — Progress Notes (Signed)
Chart note:  The patient underwent additional planning to improve coverage, particularly the left obturator region. Her left hip prosthesis degrades the dose, that she is setup to AP, PA, and right lateral fields. She will start these new fields today and continue with her prescription as written.

## 2011-10-28 NOTE — Progress Notes (Signed)
Simulation verification note The patient underwent simulation verification for treatment to her pelvis. She was set up to a 3 field technique with a new right lateral field. Her isocenter is in good position and the multileaf collimators contoured the treatment volume appropriately.

## 2011-10-29 ENCOUNTER — Ambulatory Visit
Admission: RE | Admit: 2011-10-29 | Discharge: 2011-10-29 | Disposition: A | Discharge status: Home / Self Care | Source: Ambulatory Visit | Attending: Radiation Oncology | Admitting: Radiation Oncology

## 2011-10-31 ENCOUNTER — Other Ambulatory Visit: Payer: Self-pay | Admitting: Oncology

## 2011-11-01 ENCOUNTER — Encounter: Payer: Self-pay | Admitting: Radiation Oncology

## 2011-11-01 ENCOUNTER — Ambulatory Visit
Admission: RE | Admit: 2011-11-01 | Discharge: 2011-11-01 | Disposition: A | Discharge status: Home / Self Care | Source: Ambulatory Visit | Attending: Radiation Oncology | Admitting: Radiation Oncology

## 2011-11-01 ENCOUNTER — Other Ambulatory Visit: Admitting: Lab

## 2011-11-01 ENCOUNTER — Ambulatory Visit (HOSPITAL_BASED_OUTPATIENT_CLINIC_OR_DEPARTMENT_OTHER)

## 2011-11-01 VITALS — BP 141/73 | HR 80 | Temp 97.0°F

## 2011-11-01 VITALS — BP 122/76 | HR 89 | Temp 97.9°F | Wt 236.9 lb

## 2011-11-01 DIAGNOSIS — C189 Malignant neoplasm of colon, unspecified: Secondary | ICD-10-CM

## 2011-11-01 DIAGNOSIS — Z5111 Encounter for antineoplastic chemotherapy: Secondary | ICD-10-CM

## 2011-11-01 LAB — CBC WITH DIFFERENTIAL/PLATELET
Basophils Absolute: 0 10*3/uL (ref 0.0–0.1)
EOS%: 0.7 % (ref 0.0–7.0)
Eosinophils Absolute: 0 10*3/uL (ref 0.0–0.5)
HGB: 10.4 g/dL — ABNORMAL LOW (ref 11.6–15.9)
LYMPH%: 35.6 % (ref 14.0–49.7)
MCH: 27.1 pg (ref 25.1–34.0)
MCV: 84.6 fL (ref 79.5–101.0)
MONO%: 19.7 % — ABNORMAL HIGH (ref 0.0–14.0)
Platelets: 142 10*3/uL — ABNORMAL LOW (ref 145–400)
RBC: 3.83 10*6/uL (ref 3.70–5.45)
RDW: 25.6 % — ABNORMAL HIGH (ref 11.2–14.5)

## 2011-11-01 MED ORDER — SODIUM CHLORIDE 0.9 % IV SOLN
225.0000 mg/m2/d | INTRAVENOUS | Status: DC
  Administered 2011-11-01: 3300 mg via INTRAVENOUS
  Filled 2011-11-01: qty 66

## 2011-11-01 MED ORDER — SODIUM CHLORIDE 0.9 % IV SOLN: Freq: Once | INTRAVENOUS | Status: DC

## 2011-11-01 NOTE — Progress Notes (Signed)
   Weekly Management Note, rectal cancer Current Dose:   1080cGy  Projected Dose:  5040cGy   Narrative:  The patient presents for routine under treatment assessment.  CBCT/MVCT images/Port film x-rays were reviewed.  The chart was checked. She is receiving concurrent 5-FU by continuous infusion. She has had some diarrhea.  Physical Findings: Weight: 236 lb 14.4 oz (107.457 kg). Sitting comfortably in a chair. Well-appearing.  CMP     Component Value Date/Time   NA 139 10/07/2011 1206   K 4.1 10/07/2011 1206   CL 105 10/07/2011 1206   CO2 26 10/07/2011 1206   GLUCOSE 105* 10/07/2011 1206   BUN 10 10/07/2011 1206   CREATININE 0.87 10/07/2011 1206   CREATININE 0.98 03/15/2011 0828   CALCIUM 9.8 10/07/2011 1206   PROT 7.1 10/07/2011 1206   ALBUMIN 3.6 10/07/2011 1206   AST 38* 10/07/2011 1206   ALT 55* 10/07/2011 1206   ALKPHOS 147* 10/07/2011 1206   BILITOT 0.3 10/07/2011 1206   GFRNONAA 79* 06/07/2011 0433   GFRAA >90 06/07/2011 0433   CBC    Component Value Date/Time   WBC 4.9 10/25/2011 1015   WBC 10.5 06/07/2011 0433   RBC 4.20 10/25/2011 1015   RBC 3.09* 06/07/2011 0433   HGB 10.8* 10/25/2011 1015   HGB 8.1* 06/07/2011 0433   HCT 35.2 10/25/2011 1015   HCT 26.4* 06/07/2011 0433   PLT 123* 10/25/2011 1015   PLT 465* 06/07/2011 0433   MCV 83.8 10/25/2011 1015   MCV 85.4 06/07/2011 0433   MCH 25.7 10/25/2011 1015   MCH 26.2 06/07/2011 0433   MCHC 30.7* 10/25/2011 1015   MCHC 30.7 06/07/2011 0433   RDW 24.2* 10/25/2011 1015   RDW 15.9* 06/07/2011 0433   LYMPHSABS 2.3 10/25/2011 1015   LYMPHSABS 1.5 06/07/2011 0433   MONOABS 0.8 10/25/2011 1015   MONOABS 1.0 06/07/2011 0433   EOSABS 0.0 10/25/2011 1015   EOSABS 0.2 06/07/2011 0433   BASOSABS 0.0 10/25/2011 1015   BASOSABS 0.0 06/07/2011 0433     Impression:  The patient is tolerating radiotherapy.  Plan:  Continue radiotherapy as planned. I encouraged by mouth intake, hydration, and Imodium.

## 2011-11-01 NOTE — Progress Notes (Signed)
6 fractions to pelvis for rectal cancer presently.  Denies any pain nor discomfort.  5-FU infusing.  Jasmine Clayton reports 3 loose stools within the past 24 hours.  Encouraged her to purchase anti-diarrheal medication and to increase fluid intake.

## 2011-11-02 ENCOUNTER — Ambulatory Visit
Admission: RE | Admit: 2011-11-02 | Discharge: 2011-11-02 | Disposition: A | Discharge status: Home / Self Care | Source: Ambulatory Visit | Attending: Radiation Oncology | Admitting: Radiation Oncology

## 2011-11-03 ENCOUNTER — Ambulatory Visit
Admission: RE | Admit: 2011-11-03 | Discharge: 2011-11-03 | Disposition: A | Discharge status: Home / Self Care | Source: Ambulatory Visit | Attending: Radiation Oncology | Admitting: Radiation Oncology

## 2011-11-03 ENCOUNTER — Encounter

## 2011-11-04 ENCOUNTER — Ambulatory Visit
Admission: RE | Admit: 2011-11-04 | Discharge: 2011-11-04 | Disposition: A | Discharge status: Home / Self Care | Source: Ambulatory Visit | Attending: Radiation Oncology | Admitting: Radiation Oncology

## 2011-11-04 ENCOUNTER — Inpatient Hospital Stay

## 2011-11-05 ENCOUNTER — Ambulatory Visit
Admission: RE | Admit: 2011-11-05 | Discharge: 2011-11-05 | Disposition: A | Discharge status: Home / Self Care | Source: Ambulatory Visit | Attending: Radiation Oncology | Admitting: Radiation Oncology

## 2011-11-07 ENCOUNTER — Other Ambulatory Visit: Payer: Self-pay | Admitting: Oncology

## 2011-11-08 ENCOUNTER — Ambulatory Visit
Admission: RE | Admit: 2011-11-08 | Discharge: 2011-11-08 | Disposition: A | Discharge status: Home / Self Care | Source: Ambulatory Visit | Attending: Radiation Oncology | Admitting: Radiation Oncology

## 2011-11-08 ENCOUNTER — Ambulatory Visit (HOSPITAL_BASED_OUTPATIENT_CLINIC_OR_DEPARTMENT_OTHER)

## 2011-11-08 ENCOUNTER — Other Ambulatory Visit (HOSPITAL_BASED_OUTPATIENT_CLINIC_OR_DEPARTMENT_OTHER): Admitting: Lab

## 2011-11-08 VITALS — BP 122/66 | HR 98 | Temp 98.3°F

## 2011-11-08 DIAGNOSIS — C189 Malignant neoplasm of colon, unspecified: Secondary | ICD-10-CM

## 2011-11-08 DIAGNOSIS — Z5111 Encounter for antineoplastic chemotherapy: Secondary | ICD-10-CM

## 2011-11-08 LAB — CBC WITH DIFFERENTIAL/PLATELET
BASO%: 0.2 % (ref 0.0–2.0)
EOS%: 1.5 % (ref 0.0–7.0)
HCT: 31.8 % — ABNORMAL LOW (ref 34.8–46.6)
LYMPH%: 19.1 % (ref 14.0–49.7)
MCH: 27 pg (ref 25.1–34.0)
MCHC: 31.8 g/dL (ref 31.5–36.0)
MONO#: 0.4 10*3/uL (ref 0.1–0.9)
NEUT%: 69.2 % (ref 38.4–76.8)
Platelets: 105 10*3/uL — ABNORMAL LOW (ref 145–400)

## 2011-11-08 LAB — COMPREHENSIVE METABOLIC PANEL
AST: 26 U/L (ref 0–37)
BUN: 11 mg/dL (ref 6–23)
Calcium: 9.6 mg/dL (ref 8.4–10.5)
Chloride: 105 mEq/L (ref 96–112)
Creatinine, Ser: 1.01 mg/dL (ref 0.50–1.10)

## 2011-11-08 MED ORDER — SODIUM CHLORIDE 0.9 % IV SOLN
225.0000 mg/m2/d | INTRAVENOUS | Status: DC
  Administered 2011-11-08: 3300 mg via INTRAVENOUS
  Filled 2011-11-08: qty 66

## 2011-11-08 NOTE — Progress Notes (Signed)
HERE FOR PUT TODAY.  SAYS THAT STARTED YESTERDAY FEELING LIKE HER BM WAS TRYING TO COME THROUGH RECTAL AREA EVEN THOUGH SHE HAS A COLOSTOMY.

## 2011-11-08 NOTE — Progress Notes (Signed)
Weekly Management Note:  Site:Pelvis Current Dose:  1980  cGy Projected Dose: 5040  cGy  Narrative: The patient is seen today for routine under treatment assessment. CBCT/MVCT images/port films were reviewed. The chart was reviewed.   She is without GU or GI difficulties. Specifically, she denies diarrhea or change in her colostomy habits. She continues with her Xeloda and her CBC from earlier today appears to be satisfactory although her platelet count is 105K.  Physical Examination: There were no vitals filed for this visit..  Weight:  . No significant skin changes.  Impression: Tolerating radiation therapy well.  Plan: Continue radiation therapy as planned.

## 2011-11-09 ENCOUNTER — Ambulatory Visit
Admission: RE | Admit: 2011-11-09 | Discharge: 2011-11-09 | Disposition: A | Discharge status: Home / Self Care | Source: Ambulatory Visit | Attending: Radiation Oncology | Admitting: Radiation Oncology

## 2011-11-09 ENCOUNTER — Telehealth: Payer: Self-pay | Admitting: Oncology

## 2011-11-09 ENCOUNTER — Ambulatory Visit (HOSPITAL_BASED_OUTPATIENT_CLINIC_OR_DEPARTMENT_OTHER): Admitting: Nurse Practitioner

## 2011-11-09 VITALS — BP 127/68 | HR 96 | Temp 96.8°F | Ht 60.0 in | Wt 234.3 lb

## 2011-11-09 DIAGNOSIS — C189 Malignant neoplasm of colon, unspecified: Secondary | ICD-10-CM

## 2011-11-09 DIAGNOSIS — C20 Malignant neoplasm of rectum: Secondary | ICD-10-CM

## 2011-11-09 DIAGNOSIS — D696 Thrombocytopenia, unspecified: Secondary | ICD-10-CM

## 2011-11-09 DIAGNOSIS — D509 Iron deficiency anemia, unspecified: Secondary | ICD-10-CM

## 2011-11-09 NOTE — Telephone Encounter (Signed)
Gv pt appt for april2013 °

## 2011-11-09 NOTE — Progress Notes (Signed)
OFFICE PROGRESS NOTE  Interval history:  Jasmine Clayton returns as scheduled. She continues radiation and continuous infusion 5 fluorouracil. She denies nausea/vomiting. No mouth sores. No hand or foot pain or redness. She has intermittent loose stools. She takes Imodium as needed.   Objective: Blood pressure 127/68, pulse 96, temperature 96.8 F (36 C), temperature source Oral, height 5' (1.524 m), weight 234 lb 4.8 oz (106.278 kg), last menstrual period 11/30/2009.  Oropharynx is without thrush or ulceration. Lungs are clear. No wheezes or rales. Regular cardiac rhythm. Port-A-Cath site is without erythema. Abdomen is soft. No hepatomegaly. Trace lower leg edema bilaterally. Palms are without erythema.  Lab Results: Lab Results  Component Value Date   WBC 4.1 11/08/2011   HGB 10.1* 11/08/2011   HCT 31.8* 11/08/2011   MCV 85.0 11/08/2011   PLT 105* 11/08/2011    Chemistry:    Chemistry      Component Value Date/Time   NA 141 11/08/2011 1334   K 3.9 11/08/2011 1334   CL 105 11/08/2011 1334   CO2 26 11/08/2011 1334   BUN 11 11/08/2011 1334   CREATININE 1.01 11/08/2011 1334   CREATININE 0.98 03/15/2011 0828      Component Value Date/Time   CALCIUM 9.6 11/08/2011 1334   ALKPHOS 127* 11/08/2011 1334   AST 26 11/08/2011 1334   ALT 27 11/08/2011 1334   BILITOT 0.5 11/08/2011 1334       Studies/Results: No results found.  Medications: I have reviewed the patient's current medications.  Assessment/Plan:  1. 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 She began adjuvant FOLFOX chemotherapy 07/26/2011. She completed cycle 6 on 10/07/2011. She began radiation and continuous infusion 5-fluorouracil on 10/25/2011. 2. 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 3. Open abdominal wound. The wound has healed. 4. History of a DVT while pregnant in  1979. 5. Normal preoperative CEA (1.0 on 05/14/2011). 6. Family history of cancer including endometrial cancer in her mother and colon cancer in her sister. The tumor was submitted for microsatellite instability testing. The tumor showed no evidence of microsatellite instability by PCR. 7. Microcytic anemia. Ferritin was normal at 78 on 09/23/2011. Improved. 8. Thrombocytopenia secondary to chemotherapy. We will begin checking weekly CBCs.  Disposition-Jasmine Clayton appears stable. She will continue radiation and continuous infusion 5-FU. She will return for a followup visit with Dr. Truett Perna on 11/25/2011. She will contact the office in the interim with any problems.  Plan reviewed with Dr. Truett Perna.  Lonna Cobb ANP/GNP-BC

## 2011-11-10 ENCOUNTER — Ambulatory Visit
Admission: RE | Admit: 2011-11-10 | Discharge: 2011-11-10 | Disposition: A | Discharge status: Home / Self Care | Source: Ambulatory Visit | Attending: Radiation Oncology | Admitting: Radiation Oncology

## 2011-11-11 ENCOUNTER — Ambulatory Visit
Admission: RE | Admit: 2011-11-11 | Discharge: 2011-11-11 | Disposition: A | Discharge status: Home / Self Care | Source: Ambulatory Visit | Attending: Radiation Oncology | Admitting: Radiation Oncology

## 2011-11-12 ENCOUNTER — Ambulatory Visit
Admission: RE | Admit: 2011-11-12 | Discharge: 2011-11-12 | Disposition: A | Discharge status: Home / Self Care | Source: Ambulatory Visit | Attending: Radiation Oncology | Admitting: Radiation Oncology

## 2011-11-14 ENCOUNTER — Other Ambulatory Visit: Payer: Self-pay | Admitting: Oncology

## 2011-11-15 ENCOUNTER — Ambulatory Visit
Admission: RE | Admit: 2011-11-15 | Discharge: 2011-11-15 | Disposition: A | Discharge status: Home / Self Care | Source: Ambulatory Visit | Attending: Radiation Oncology | Admitting: Radiation Oncology

## 2011-11-15 ENCOUNTER — Other Ambulatory Visit (HOSPITAL_BASED_OUTPATIENT_CLINIC_OR_DEPARTMENT_OTHER)

## 2011-11-15 ENCOUNTER — Ambulatory Visit (HOSPITAL_BASED_OUTPATIENT_CLINIC_OR_DEPARTMENT_OTHER)

## 2011-11-15 VITALS — BP 132/82 | HR 80 | Temp 97.9°F | Wt 234.2 lb

## 2011-11-15 DIAGNOSIS — C20 Malignant neoplasm of rectum: Secondary | ICD-10-CM

## 2011-11-15 DIAGNOSIS — C189 Malignant neoplasm of colon, unspecified: Secondary | ICD-10-CM

## 2011-11-15 DIAGNOSIS — Z5111 Encounter for antineoplastic chemotherapy: Secondary | ICD-10-CM

## 2011-11-15 LAB — CBC WITH DIFFERENTIAL/PLATELET
BASO%: 0.2 % (ref 0.0–2.0)
EOS%: 3 % (ref 0.0–7.0)
MCH: 28.1 pg (ref 25.1–34.0)
MCHC: 32.1 g/dL (ref 31.5–36.0)
MONO#: 0.6 10*3/uL (ref 0.1–0.9)
RBC: 3.6 10*6/uL — ABNORMAL LOW (ref 3.70–5.45)
RDW: 24.9 % — ABNORMAL HIGH (ref 11.2–14.5)
WBC: 4.7 10*3/uL (ref 3.9–10.3)
lymph#: 0.8 10*3/uL — ABNORMAL LOW (ref 0.9–3.3)
nRBC: 0 % (ref 0–0)

## 2011-11-15 MED ORDER — SODIUM CHLORIDE 0.9 % IV SOLN: Freq: Once | INTRAVENOUS | Status: DC

## 2011-11-15 MED ORDER — SODIUM CHLORIDE 0.9 % IV SOLN
225.0000 mg/m2/d | INTRAVENOUS | Status: DC
  Administered 2011-11-15: 3300 mg via INTRAVENOUS
  Filled 2011-11-15: qty 66

## 2011-11-15 NOTE — Progress Notes (Signed)
Patietn here for routine weekly under treat visit for rectal ca.Has completed 16 total treatments so far. Denies pain, nausea. Takes imodium ad for diarrheal stools of 3/day on average. Weight maintained within 2 to 3 lbs.

## 2011-11-15 NOTE — Progress Notes (Signed)
Weekly Management Note:  Site:Pelvis Current Dose:  2880  cGy Projected Dose: 5040  cGy  Narrative: The patient is seen today for routine under treatment assessment. CBCT/MVCT images/port films were reviewed. The chart was reviewed.   Without GU or GI difficulties. She does take up to one to 2 Imodium a day for loosening of her bowels. Her colostomy is functioning well. CBC from this morning is satisfactory. She continues with her infusion 5-FU.  Physical Examination:  Filed Vitals:   11/15/11 1539  BP: 132/82  Pulse: 80  Temp: 97.9 F (36.6 C)  .  Weight: 234 lb 3.2 oz (106.232 kg). No change.  Impression: Tolerating radiation therapy well.  Plan: Continue radiation therapy as planned.

## 2011-11-16 ENCOUNTER — Ambulatory Visit
Admission: RE | Admit: 2011-11-16 | Discharge: 2011-11-16 | Disposition: A | Discharge status: Home / Self Care | Source: Ambulatory Visit | Attending: Radiation Oncology | Admitting: Radiation Oncology

## 2011-11-17 ENCOUNTER — Ambulatory Visit
Admission: RE | Admit: 2011-11-17 | Discharge: 2011-11-17 | Disposition: A | Discharge status: Home / Self Care | Source: Ambulatory Visit | Attending: Radiation Oncology | Admitting: Radiation Oncology

## 2011-11-17 ENCOUNTER — Encounter: Payer: Self-pay | Admitting: Radiation Oncology

## 2011-11-17 NOTE — Progress Notes (Signed)
Simulation note:  The patient underwent virtual simulation for her reduced pelvic/rectal field. She was set up  PA, and also right and left lateral fields. 3 separate multileaf collimators were designed to conform the field. I prescribing a further 540 cGy in 3 sessions utilizing 18 MV photons. An isodose plan and dosimetry are requested. Wedge compensation is expected.

## 2011-11-18 ENCOUNTER — Ambulatory Visit
Admission: RE | Admit: 2011-11-18 | Discharge: 2011-11-18 | Disposition: A | Discharge status: Home / Self Care | Source: Ambulatory Visit | Attending: Radiation Oncology | Admitting: Radiation Oncology

## 2011-11-19 ENCOUNTER — Other Ambulatory Visit: Payer: Self-pay | Admitting: *Deleted

## 2011-11-19 ENCOUNTER — Ambulatory Visit
Admission: RE | Admit: 2011-11-19 | Discharge: 2011-11-19 | Disposition: A | Discharge status: Home / Self Care | Source: Ambulatory Visit | Attending: Radiation Oncology | Admitting: Radiation Oncology

## 2011-11-21 ENCOUNTER — Other Ambulatory Visit: Payer: Self-pay | Admitting: Oncology

## 2011-11-22 ENCOUNTER — Ambulatory Visit (HOSPITAL_BASED_OUTPATIENT_CLINIC_OR_DEPARTMENT_OTHER)

## 2011-11-22 ENCOUNTER — Ambulatory Visit
Admission: RE | Admit: 2011-11-22 | Discharge: 2011-11-22 | Disposition: A | Discharge status: Home / Self Care | Source: Ambulatory Visit | Attending: Radiation Oncology | Admitting: Radiation Oncology

## 2011-11-22 ENCOUNTER — Other Ambulatory Visit: Payer: Self-pay | Admitting: *Deleted

## 2011-11-22 ENCOUNTER — Other Ambulatory Visit (HOSPITAL_BASED_OUTPATIENT_CLINIC_OR_DEPARTMENT_OTHER): Admitting: Lab

## 2011-11-22 ENCOUNTER — Encounter: Payer: Self-pay | Admitting: Radiation Oncology

## 2011-11-22 VITALS — BP 132/80 | HR 81 | Resp 18 | Wt 231.6 lb

## 2011-11-22 VITALS — BP 114/67 | HR 82 | Temp 97.7°F

## 2011-11-22 DIAGNOSIS — C20 Malignant neoplasm of rectum: Secondary | ICD-10-CM

## 2011-11-22 DIAGNOSIS — C189 Malignant neoplasm of colon, unspecified: Secondary | ICD-10-CM

## 2011-11-22 DIAGNOSIS — Z5111 Encounter for antineoplastic chemotherapy: Secondary | ICD-10-CM

## 2011-11-22 LAB — CBC WITH DIFFERENTIAL/PLATELET
Eosinophils Absolute: 0.2 10*3/uL (ref 0.0–0.5)
MCV: 88.6 fL (ref 79.5–101.0)
MONO#: 0.8 10*3/uL (ref 0.1–0.9)
MONO%: 14.8 % — ABNORMAL HIGH (ref 0.0–14.0)
NEUT#: 3.8 10*3/uL (ref 1.5–6.5)
RBC: 3.61 10*6/uL — ABNORMAL LOW (ref 3.70–5.45)
RDW: 25.4 % — ABNORMAL HIGH (ref 11.2–14.5)
WBC: 5.4 10*3/uL (ref 3.9–10.3)
lymph#: 0.5 10*3/uL — ABNORMAL LOW (ref 0.9–3.3)
nRBC: 0 % (ref 0–0)

## 2011-11-22 MED ORDER — FLUCONAZOLE 100 MG PO TABS: 50.0000 mg | ORAL_TABLET | Freq: Every day | ORAL | Status: DC

## 2011-11-22 MED ORDER — SODIUM CHLORIDE 0.9 % IV SOLN
225.0000 mg/m2/d | INTRAVENOUS | Status: DC
  Administered 2011-11-22: 3300 mg via INTRAVENOUS
  Filled 2011-11-22: qty 66

## 2011-11-22 MED ORDER — HEPARIN SOD (PORK) LOCK FLUSH 100 UNIT/ML IV SOLN
500.0000 [IU] | Freq: Once | INTRAVENOUS | Status: AC | PRN
  Administered 2011-11-22: 500 [IU]
  Filled 2011-11-22: qty 5

## 2011-11-22 MED ORDER — SODIUM CHLORIDE 0.9 % IJ SOLN
10.0000 mL | INTRAMUSCULAR | Status: DC | PRN
  Administered 2011-11-22: 10 mL
  Filled 2011-11-22: qty 10

## 2011-11-22 NOTE — Progress Notes (Signed)
Pt here for chemo pump exchanged.   Labs good per parameters.   Pt c/o diarrhea  X 3  Sat,  X 2   Sun,  And  X 1  Today.   Pt has colostomy.  Pt stated she has been taking Imodium with relief.   Pt c/o tongue white colored - noticed for 2 days now.   Denied problems with eating or drinking.   C/O  Abdominal cramping which associated with radiation.  Instructed pt to notify radiation nurses today at treatment of abdominal cramping.  Pt voiced understanding. Dr. Truett Perna notified of above problems.   Orders received for Diflucan 50 mg daily x 5 days.   Rx sent to [facility], and explanations given to pt.

## 2011-11-22 NOTE — Progress Notes (Signed)
Weekly Management Note:  Site:Pelvis Current Dose:  3780  cGy Projected Dose: 5040  cGy  Narrative: The patient is seen today for routine under treatment assessment. CBCT/MVCT images/port films were reviewed. The chart was reviewed.   She has intermittent lower abdominal crampy discomfort with watery movements through her colostomy. She feels more fatigued. No significant skin issues. CBC from today is satisfactory.  Physical Examination:  Filed Vitals:   11/22/11 1616  BP: 132/80  Pulse: 81  Resp: 18  .  Weight: 231 lb 9.6 oz (105.053 kg). No change.  Impression: Tolerating radiation therapy well. She may take Imodium when necessary for her loose bowels.  Plan: Continue radiation therapy as planned.

## 2011-11-22 NOTE — Progress Notes (Signed)
Patient presents to the clinic today for an under treat visit with Dr. Dayton Scrape. Patient is alert and oriented to person, place, and time. No distress noted. Steady gait noted. Pleasant affect noted. Patient denies pain at this time. Patient reports tenderness of the lower abdomen. Also, patient reports decreased energy level. Patient has colostomy without issues or concerns. Patient reports stoma is pink and moist. Patient scheduled for chemo pump exchange this after. Patient reports using Imodium bid. Reported all findings to Dr. Dayton Scrape.

## 2011-11-23 ENCOUNTER — Ambulatory Visit
Admission: RE | Admit: 2011-11-23 | Discharge: 2011-11-23 | Disposition: A | Discharge status: Home / Self Care | Source: Ambulatory Visit | Attending: Radiation Oncology | Admitting: Radiation Oncology

## 2011-11-23 ENCOUNTER — Other Ambulatory Visit: Payer: Self-pay | Admitting: *Deleted

## 2011-11-24 ENCOUNTER — Ambulatory Visit
Admission: RE | Admit: 2011-11-24 | Discharge: 2011-11-24 | Disposition: A | Discharge status: Home / Self Care | Source: Ambulatory Visit | Attending: Radiation Oncology | Admitting: Radiation Oncology

## 2011-11-24 ENCOUNTER — Encounter

## 2011-11-25 ENCOUNTER — Ambulatory Visit: Admitting: Nurse Practitioner

## 2011-11-25 ENCOUNTER — Ambulatory Visit
Admission: RE | Admit: 2011-11-25 | Discharge: 2011-11-25 | Disposition: A | Discharge status: Home / Self Care | Source: Ambulatory Visit | Attending: Radiation Oncology | Admitting: Radiation Oncology

## 2011-11-25 ENCOUNTER — Telehealth: Payer: Self-pay | Admitting: Oncology

## 2011-11-25 NOTE — Telephone Encounter (Signed)
Pt was in rad onc and they kept her too long ans she missed appt with lisa.  appt r/s to 4/26  aom

## 2011-11-26 ENCOUNTER — Telehealth: Payer: Self-pay | Admitting: Nurse Practitioner

## 2011-11-26 ENCOUNTER — Telehealth: Payer: Self-pay | Admitting: Oncology

## 2011-11-26 ENCOUNTER — Ambulatory Visit (HOSPITAL_BASED_OUTPATIENT_CLINIC_OR_DEPARTMENT_OTHER)

## 2011-11-26 ENCOUNTER — Ambulatory Visit (HOSPITAL_BASED_OUTPATIENT_CLINIC_OR_DEPARTMENT_OTHER): Admitting: Nurse Practitioner

## 2011-11-26 ENCOUNTER — Ambulatory Visit
Admission: RE | Admit: 2011-11-26 | Discharge: 2011-11-26 | Disposition: A | Discharge status: Home / Self Care | Source: Ambulatory Visit | Attending: Radiation Oncology | Admitting: Radiation Oncology

## 2011-11-26 VITALS — BP 123/69 | HR 75 | Temp 97.9°F | Ht 60.0 in | Wt 226.6 lb

## 2011-11-26 DIAGNOSIS — R300 Dysuria: Secondary | ICD-10-CM

## 2011-11-26 DIAGNOSIS — D649 Anemia, unspecified: Secondary | ICD-10-CM

## 2011-11-26 DIAGNOSIS — R197 Diarrhea, unspecified: Secondary | ICD-10-CM

## 2011-11-26 DIAGNOSIS — C20 Malignant neoplasm of rectum: Secondary | ICD-10-CM

## 2011-11-26 LAB — URINALYSIS, MICROSCOPIC - CHCC
Bilirubin (Urine): NEGATIVE
pH: 6 (ref 4.6–8.0)

## 2011-11-26 NOTE — Telephone Encounter (Signed)
I notified Ms. Newnam the urinalysis from today was consistent with an infection. A prescription was called to her pharmacy for ciprofloxacin 500 mg twice daily for 5 days.

## 2011-11-26 NOTE — Telephone Encounter (Signed)
Gave pt calendar for April June and May, lab , chemo and md visit

## 2011-11-26 NOTE — Progress Notes (Signed)
OFFICE PROGRESS NOTE  Interval history:  Jasmine Clayton returns as scheduled. She continues continuous infusion 5 fluorouracil and daily radiation. She denies mouth sores. She has noted some "dark spot" on her tongue. She has mild intermittent nausea. Appetite is poor. She is having diarrhea. The diarrhea is controlled with Imodium. No hand or foot pain or redness. Over the past week she has been experiencing intermittent "crampy" abdominal discomfort and dysuria. She denies fevers.   Objective: Blood pressure 123/69, pulse 75, temperature 97.9 F (36.6 C), temperature source Oral, height 5' (1.524 m), weight 226 lb 9.6 oz (102.785 kg), last menstrual period 11/30/2009.  Tongue with scattered areas of hyperpigmentation. No ulcerations. No thrush. Lungs clear. Regular cardiac rhythm. Port-A-Cath site is without erythema. Abdomen is soft. She is mildly tender at the lower abdomen. Ostomy with liquid stool in the collection bag. Extremities without edema. Perirectal region is without erythema or skin breakdown.  Lab Results: Lab Results  Component Value Date   WBC 5.4 11/22/2011   HGB 10.4* 11/22/2011   HCT 32.0* 11/22/2011   MCV 88.6 11/22/2011   PLT 170 11/22/2011    Chemistry:    Chemistry      Component Value Date/Time   NA 141 11/08/2011 1334   K 3.9 11/08/2011 1334   CL 105 11/08/2011 1334   CO2 26 11/08/2011 1334   BUN 11 11/08/2011 1334   CREATININE 1.01 11/08/2011 1334   CREATININE 0.98 03/15/2011 0828      Component Value Date/Time   CALCIUM 9.6 11/08/2011 1334   ALKPHOS 127* 11/08/2011 1334   AST 26 11/08/2011 1334   ALT 27 11/08/2011 1334   BILITOT 0.5 11/08/2011 1334       Studies/Results: No results found.  Medications: I have reviewed the patient's current medications.  Assessment/Plan:  1. 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 She began adjuvant FOLFOX  chemotherapy 07/26/2011. She completed cycle 6 on 10/07/2011. She began radiation and continuous infusion 5-fluorouracil on 10/25/2011. 2. 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 3. Open abdominal wound. The wound has healed. 4. History of a DVT while pregnant in 1979. 5. Normal preoperative CEA (1.0 on 05/14/2011). 6. Family history of cancer including endometrial cancer in her mother and colon cancer in her sister. The tumor was submitted for microsatellite instability testing. The tumor showed no evidence of microsatellite instability by PCR. 7. Microcytic anemia. Ferritin was normal at 78 on 09/23/2011. Hemoglobin is stable. 8. Thrombocytopenia secondary to chemotherapy. The platelet count is in normal range today. 9. Dysuria, lower abdominal discomfort. Question bladder spasms related to radiation. We will check a urinalysis today. 10. Diarrhea secondary to radiation and chemotherapy. She will continue Imodium as needed. She will contact the office if Imodium is not effective.  Disposition-Jasmine Clayton continues continuous infusion 5 fluorouracil and radiation. She will complete the course of radiation on 12/01/2011. The 5-fluorouracil pump will be removed on 12/01/2011. She will return for an office visit and FOLFOX chemotherapy on 12/16/2011. She will contact the office in the interim as outlined above or with any other problems.  Plan reviewed with Dr. Truett Perna.  Lonna Cobb ANP/GNP-BC

## 2011-11-29 ENCOUNTER — Other Ambulatory Visit: Payer: Self-pay | Admitting: Oncology

## 2011-11-29 ENCOUNTER — Ambulatory Visit (HOSPITAL_BASED_OUTPATIENT_CLINIC_OR_DEPARTMENT_OTHER)

## 2011-11-29 ENCOUNTER — Encounter: Payer: Self-pay | Admitting: Radiation Oncology

## 2011-11-29 ENCOUNTER — Ambulatory Visit
Admission: RE | Admit: 2011-11-29 | Discharge: 2011-11-29 | Disposition: A | Discharge status: Home / Self Care | Source: Ambulatory Visit | Attending: Radiation Oncology | Admitting: Radiation Oncology

## 2011-11-29 ENCOUNTER — Other Ambulatory Visit: Admitting: Lab

## 2011-11-29 VITALS — BP 120/75 | HR 83 | Temp 97.6°F

## 2011-11-29 VITALS — BP 127/77 | HR 98 | Resp 18 | Wt 224.3 lb

## 2011-11-29 DIAGNOSIS — C189 Malignant neoplasm of colon, unspecified: Secondary | ICD-10-CM

## 2011-11-29 DIAGNOSIS — Z5111 Encounter for antineoplastic chemotherapy: Secondary | ICD-10-CM

## 2011-11-29 LAB — CBC WITH DIFFERENTIAL/PLATELET
BASO%: 0.2 % (ref 0.0–2.0)
HCT: 34.1 % — ABNORMAL LOW (ref 34.8–46.6)
MCHC: 32.8 g/dL (ref 31.5–36.0)
MONO#: 0.6 10*3/uL (ref 0.1–0.9)
RBC: 3.74 10*6/uL (ref 3.70–5.45)
RDW: 26.3 % — ABNORMAL HIGH (ref 11.2–14.5)
WBC: 4.8 10*3/uL (ref 3.9–10.3)
lymph#: 0.6 10*3/uL — ABNORMAL LOW (ref 0.9–3.3)
nRBC: 0 % (ref 0–0)

## 2011-11-29 LAB — URINE CULTURE

## 2011-11-29 MED ORDER — SODIUM CHLORIDE 0.9 % IJ SOLN
10.0000 mL | INTRAMUSCULAR | Status: DC | PRN
  Administered 2011-11-29: 10 mL
  Filled 2011-11-29: qty 10

## 2011-11-29 MED ORDER — SODIUM CHLORIDE 0.9 % IV SOLN
225.0000 mg/m2/d | INTRAVENOUS | Status: DC
  Administered 2011-11-29: 950 mg via INTRAVENOUS
  Filled 2011-11-29: qty 19

## 2011-11-29 NOTE — Progress Notes (Signed)
Patient presents to the clinic today unaccompanied for an under treat visit with Dr. Dayton Scrape. Patient is alert and oriented to person, place, and time. No distress noted. Steady gait noted. Pleasant affect noted. Patient denies pain at this time. Patient denies nausea, vomiting, dizziness or headache. Patient reports stoma of colostomy is pink, moist and without complications. Patient's only complaints is "being short winded every now and then." Reported all findings to Dr. Dayton Scrape.

## 2011-11-29 NOTE — Patient Instructions (Signed)
[facility] Discharge Instructions for Patients Receiving Chemotherapy  Today you received the following chemotherapy agents 55fu To help prevent nausea and vomiting after your treatment, we encourage you to take your nausea medication Begin taking itand take it as often as prescribed   If you develop nausea and vomiting that is not controlled by your nausea medication, call the clinic. If it is after clinic hours your family physician or the after hours number for the clinic or go to the Emergency Department.   BELOW ARE SYMPTOMS THAT SHOULD BE REPORTED IMMEDIATELY:  *FEVER GREATER THAN 100.5 F  *CHILLS WITH OR WITHOUT FEVER  NAUSEA AND VOMITING THAT IS NOT CONTROLLED WITH YOUR NAUSEA MEDICATION  *UNUSUAL SHORTNESS OF BREATH  *UNUSUAL BRUISING OR BLEEDING  TENDERNESS IN MOUTH AND THROAT WITH OR WITHOUT PRESENCE OF ULCERS  *URINARY PROBLEMS  *BOWEL PROBLEMS  UNUSUAL RASH Items with * indicate a potential emergency and should be followed up as soon as possible.  One of the nurses will contact you 24 hours after your treatment. Please let the nurse know about any problems that you may have experienced. Feel free to call the clinic you have any questions or concerns. The clinic phone number is [phone].   I have been informed and understand all the instructions given to me. I know to contact the clinic, my physician, or go to the Emergency Department if any problems should occur. I do not have any questions at this time, but understand that I may call the clinic during office hours   should I have any questions or need assistance in obtaining follow up care.    __________________________________________  _____________  __________ Signature of Patient or Authorized Representative            Date                   Time    __________________________________________ Nurse's Signature

## 2011-11-29 NOTE — Progress Notes (Signed)
Simulation verification note: On April 26 the patient underwent simulation verification for her reduced pelvic/rectal field. Her isocenter was in good position and the multileaf collimators contoured the treatment volume appropriately.

## 2011-11-29 NOTE — Progress Notes (Signed)
Weekly Management Note:  Site:Pelvic/rectal boost Current Dose:  4680  cGy Projected Dose: 5040  cGy  Narrative: The patient is seen today for routine under treatment assessment. CBCT/MVCT images/port films were reviewed. The chart was reviewed.   She is without new complaints today. Her bowels remain quite loose and she takes up to 3 Imodium a day. Her CBC from earlier today is satisfactory. She will finish her radiation therapy this Wednesday. She denies skin of old problems. She does admit to slight dysuria.  Physical Examination:  Filed Vitals:   11/29/11 1550  BP: 127/77  Pulse: 98  Resp: 18  .  Weight: 224 lb 4.8 oz (101.742 kg). No change. No areas of moist desquamation.  Impression: Tolerating radiation therapy well.  Plan: Continue radiation therapy as planned. She'll finish her radiation therapy this Wednesday.

## 2011-11-30 ENCOUNTER — Other Ambulatory Visit: Payer: Self-pay | Admitting: *Deleted

## 2011-11-30 ENCOUNTER — Ambulatory Visit
Admission: RE | Admit: 2011-11-30 | Discharge: 2011-11-30 | Disposition: A | Discharge status: Home / Self Care | Source: Ambulatory Visit | Attending: Radiation Oncology | Admitting: Radiation Oncology

## 2011-11-30 DIAGNOSIS — C189 Malignant neoplasm of colon, unspecified: Secondary | ICD-10-CM

## 2011-11-30 MED ORDER — OXYCODONE-ACETAMINOPHEN 5-325 MG PO TABS: ORAL_TABLET | ORAL | Status: DC

## 2011-12-01 ENCOUNTER — Encounter: Payer: Self-pay | Admitting: Radiation Oncology

## 2011-12-01 ENCOUNTER — Ambulatory Visit (HOSPITAL_BASED_OUTPATIENT_CLINIC_OR_DEPARTMENT_OTHER)

## 2011-12-01 ENCOUNTER — Ambulatory Visit
Admission: RE | Admit: 2011-12-01 | Discharge: 2011-12-01 | Disposition: A | Discharge status: Home / Self Care | Source: Ambulatory Visit | Attending: Radiation Oncology | Admitting: Radiation Oncology

## 2011-12-01 VITALS — BP 122/75 | HR 80 | Temp 97.3°F

## 2011-12-01 DIAGNOSIS — C189 Malignant neoplasm of colon, unspecified: Secondary | ICD-10-CM

## 2011-12-01 DIAGNOSIS — C20 Malignant neoplasm of rectum: Secondary | ICD-10-CM

## 2011-12-01 MED ORDER — HEPARIN SOD (PORK) LOCK FLUSH 100 UNIT/ML IV SOLN
500.0000 [IU] | Freq: Once | INTRAVENOUS | Status: DC | PRN
  Filled 2011-12-01: qty 5

## 2011-12-01 MED ORDER — SODIUM CHLORIDE 0.9 % IJ SOLN
10.0000 mL | INTRAMUSCULAR | Status: DC | PRN
Start: 2011-12-01 — End: 2011-12-01
  Filled 2011-12-01: qty 10

## 2011-12-01 NOTE — Progress Notes (Signed)
 [facility] End of Treatment Note  Name:Jasmine Clayton  Date: 12/01/2011 UJW:[id number] DOB:04-05-1951   Status:outpatient    CC: Dr. Mancel Bale  REFERRING PHYSICIAN: Dr. 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 Three-field photon boost to the posterior pelvis/presacral region.                   NARRATIVE:  The patient tolerated treatment well although she does have some loosening of her bowels for which he took Imodium when necessary. Surprisingly, she had minimal skin toxicity by completion of therapy. Her blood counts may satisfactory.                          PLAN: Routine followup in one month. Patient instructed to call if questions or worsening complaints in interim.

## 2011-12-06 ENCOUNTER — Other Ambulatory Visit: Payer: Self-pay | Admitting: Certified Registered Nurse Anesthetist

## 2011-12-12 ENCOUNTER — Other Ambulatory Visit: Payer: Self-pay | Admitting: Oncology

## 2011-12-15 ENCOUNTER — Ambulatory Visit: Admitting: Oncology

## 2011-12-16 ENCOUNTER — Ambulatory Visit (HOSPITAL_BASED_OUTPATIENT_CLINIC_OR_DEPARTMENT_OTHER): Admitting: Oncology

## 2011-12-16 ENCOUNTER — Ambulatory Visit (HOSPITAL_BASED_OUTPATIENT_CLINIC_OR_DEPARTMENT_OTHER)

## 2011-12-16 ENCOUNTER — Other Ambulatory Visit (HOSPITAL_BASED_OUTPATIENT_CLINIC_OR_DEPARTMENT_OTHER): Admitting: Lab

## 2011-12-16 ENCOUNTER — Telehealth: Payer: Self-pay | Admitting: Oncology

## 2011-12-16 VITALS — BP 140/80 | HR 73 | Temp 97.4°F | Ht 60.0 in | Wt 231.9 lb

## 2011-12-16 DIAGNOSIS — D649 Anemia, unspecified: Secondary | ICD-10-CM

## 2011-12-16 DIAGNOSIS — C20 Malignant neoplasm of rectum: Secondary | ICD-10-CM

## 2011-12-16 DIAGNOSIS — Z5111 Encounter for antineoplastic chemotherapy: Secondary | ICD-10-CM

## 2011-12-16 DIAGNOSIS — C189 Malignant neoplasm of colon, unspecified: Secondary | ICD-10-CM

## 2011-12-16 DIAGNOSIS — M25569 Pain in unspecified knee: Secondary | ICD-10-CM

## 2011-12-16 DIAGNOSIS — Z809 Family history of malignant neoplasm, unspecified: Secondary | ICD-10-CM

## 2011-12-16 DIAGNOSIS — D696 Thrombocytopenia, unspecified: Secondary | ICD-10-CM

## 2011-12-16 LAB — CBC WITH DIFFERENTIAL/PLATELET
Basophils Absolute: 0 10*3/uL (ref 0.0–0.1)
Eosinophils Absolute: 0.1 10*3/uL (ref 0.0–0.5)
HCT: 32.5 % — ABNORMAL LOW (ref 34.8–46.6)
HGB: 10.2 g/dL — ABNORMAL LOW (ref 11.6–15.9)
MCV: 96.2 fL (ref 79.5–101.0)
MONO%: 13.7 % (ref 0.0–14.0)
NEUT#: 3.1 10*3/uL (ref 1.5–6.5)
NEUT%: 68.4 % (ref 38.4–76.8)
RDW: 25.1 % — ABNORMAL HIGH (ref 11.2–14.5)
lymph#: 0.7 10*3/uL — ABNORMAL LOW (ref 0.9–3.3)

## 2011-12-16 LAB — TECHNOLOGIST REVIEW

## 2011-12-16 LAB — COMPREHENSIVE METABOLIC PANEL
Albumin: 3.2 g/dL — ABNORMAL LOW (ref 3.5–5.2)
BUN: 9 mg/dL (ref 6–23)
CO2: 25 mEq/L (ref 19–32)
Calcium: 9.1 mg/dL (ref 8.4–10.5)
Glucose, Bld: 97 mg/dL (ref 70–99)
Potassium: 3.9 mEq/L (ref 3.5–5.3)
Sodium: 141 mEq/L (ref 135–145)
Total Protein: 6.3 g/dL (ref 6.0–8.3)

## 2011-12-16 MED ORDER — LEUCOVORIN CALCIUM INJECTION 350 MG
400.0000 mg/m2 | Freq: Once | INTRAVENOUS | Status: AC
  Administered 2011-12-16: 828 mg via INTRAVENOUS
  Filled 2011-12-16: qty 41.4

## 2011-12-16 MED ORDER — FLUOROURACIL CHEMO INJECTION 2.5 GM/50ML
400.0000 mg/m2 | Freq: Once | INTRAVENOUS | Status: AC
  Administered 2011-12-16: 850 mg via INTRAVENOUS
  Filled 2011-12-16: qty 17

## 2011-12-16 MED ORDER — SODIUM CHLORIDE 0.9 % IV SOLN
2400.0000 mg/m2 | INTRAVENOUS | Status: DC
  Administered 2011-12-16: 4950 mg via INTRAVENOUS
  Filled 2011-12-16: qty 99

## 2011-12-16 MED ORDER — ONDANSETRON 8 MG/50ML IVPB (CHCC)
8.0000 mg | Freq: Once | INTRAVENOUS | Status: AC
  Administered 2011-12-16: 8 mg via INTRAVENOUS

## 2011-12-16 MED ORDER — DEXTROSE 5 % IV SOLN
Freq: Once | INTRAVENOUS | Status: AC
  Administered 2011-12-16: 12:00:00 via INTRAVENOUS

## 2011-12-16 MED ORDER — OXALIPLATIN CHEMO INJECTION 100 MG/20ML
85.0000 mg/m2 | Freq: Once | INTRAVENOUS | Status: AC
  Administered 2011-12-16: 175 mg via INTRAVENOUS
  Filled 2011-12-16: qty 35

## 2011-12-16 MED ORDER — DEXAMETHASONE SODIUM PHOSPHATE 10 MG/ML IJ SOLN
10.0000 mg | Freq: Once | INTRAMUSCULAR | Status: AC
  Administered 2011-12-16: 10 mg via INTRAVENOUS

## 2011-12-16 NOTE — Patient Instructions (Signed)
[facility] Discharge Instructions for Patients Receiving Chemotherapy  Today you received the following chemotherapy agents Oxaliplatin, Leucovorin and 5FU  To help prevent nausea and vomiting after your treatment, we encourage you to take your nausea medication as prescribed.   If you develop nausea and vomiting that is not controlled by your nausea medication, call the clinic. If it is after clinic hours your family physician or the after hours number for the clinic or go to the Emergency Department.   BELOW ARE SYMPTOMS THAT SHOULD BE REPORTED IMMEDIATELY:  *FEVER GREATER THAN 100.5 F  *CHILLS WITH OR WITHOUT FEVER  NAUSEA AND VOMITING THAT IS NOT CONTROLLED WITH YOUR NAUSEA MEDICATION  *UNUSUAL SHORTNESS OF BREATH  *UNUSUAL BRUISING OR BLEEDING  TENDERNESS IN MOUTH AND THROAT WITH OR WITHOUT PRESENCE OF ULCERS  *URINARY PROBLEMS  *BOWEL PROBLEMS  UNUSUAL RASH Items with * indicate a potential emergency and should be followed up as soon as possible.  One of the nurses will contact you 24 hours after your treatment. Please let the nurse know about any problems that you may have experienced. Feel free to call the clinic you have any questions or concerns. The clinic phone number is [phone].   I have been informed and understand all the instructions given to me. I know to contact the clinic, my physician, or go to the Emergency Department if any problems should occur. I do not have any questions at this time, but understand that I may call the clinic during office hours   should I have any questions or need assistance in obtaining follow up care.    __________________________________________  _____________  __________ Signature of Patient or Authorized Representative            Date                   Time    __________________________________________ Nurse's Signature    

## 2011-12-16 NOTE — Progress Notes (Signed)
   [facility]    OFFICE PROGRESS NOTE   INTERVAL HISTORY:   She completed infusional 5-fluorouracil and radiation on 12/01/2011 . Diarrhea has resolved. She complains of pain at the right knee. This has been present for the past several weeks. No right leg swelling. No neuropathy symptoms.    Objective:  Vital signs in last 24 hours:  Blood pressure 140/80, pulse 73, temperature 97.4 F (36.3 C), temperature source Oral, height 5' (1.524 m), weight 231 lb 14.4 oz (105.189 kg), last menstrual period 11/30/2009.    HEENT: No thrush or ulcers Resp: Lungs clear bilaterally Cardio: Regular rate and rhythm GI: No hepatomegaly, right lower quadrant ostomy, no mass Vascular: No leg edema Musculoskeletal: The area of discomfort is in the right popliteal fossa. No erythema or mass. There is a valgus deformity at both knees.  Skin: Hyperpigmentation at the groin and perineum. No skin breakdown.   Portacath/PICC-without erythema  Lab Results:  Lab Results  Component Value Date   WBC 4.6 12/16/2011   HGB 10.2* 12/16/2011   HCT 32.5* 12/16/2011   MCV 96.2 12/16/2011   PLT 176 12/16/2011   ANC 3.1    Medications: I have reviewed the patient's current medications.  Assessment/Plan: 1. 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 She began adjuvant FOLFOX chemotherapy 07/26/2011. She completed cycle 6 on 10/07/2011. She began radiation and continuous infusion 5-fluorouracil on 10/25/2011, completed 12/01/2011. 2. 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 3. Open abdominal wound. The wound has healed. 4. History of a DVT while pregnant in 1979. 5. Normal preoperative CEA (1.0 on 05/14/2011). 6. Family history of cancer including endometrial cancer in her mother and colon cancer in her sister. The tumor was  submitted for microsatellite instability testing. The tumor showed no evidence of microsatellite instability by PCR. 7. Microcytic anemia. Ferritin was normal at 78 on 09/23/2011. Hemoglobin is stable. 8. Thrombocytopenia secondary to chemotherapy. The platelet count is in normal range today.       9.   Diarrhea secondary to radiation and chemotherapy. Resolved.     10.   Right knee discomfort-likely related to a musculoskeletal condition at the right knee. I have a low clinical suspicion for a deep vein thrombosis. We will make an orthopedics referral   Disposition:  She completed adjuvant 5-fluorouracil and radiation. The plan is to resume adjuvant FOLFOX chemotherapy today. She will complete 3 additional cycles of FOLFOX chemotherapy. We will then refer her to Dr. Dwain Sarna for an ileostomy reversal procedure.  Ms. Burleigh will return for an office visit and chemotherapy in 2 weeks.   Thornton Papas, MD  12/16/2011  9:13 PM

## 2011-12-16 NOTE — Telephone Encounter (Signed)
Talked to [facility] they are going to give pt appt as soon as records are reviewed by coordinator. Gave them all the informations and took referral to HIM

## 2011-12-18 ENCOUNTER — Ambulatory Visit (HOSPITAL_BASED_OUTPATIENT_CLINIC_OR_DEPARTMENT_OTHER): Admitting: Nurse Practitioner

## 2011-12-18 ENCOUNTER — Ambulatory Visit

## 2011-12-18 DIAGNOSIS — C189 Malignant neoplasm of colon, unspecified: Secondary | ICD-10-CM

## 2011-12-28 ENCOUNTER — Encounter: Payer: Self-pay | Admitting: Radiation Oncology

## 2011-12-29 ENCOUNTER — Other Ambulatory Visit: Payer: Self-pay | Admitting: Oncology

## 2011-12-30 ENCOUNTER — Ambulatory Visit (HOSPITAL_BASED_OUTPATIENT_CLINIC_OR_DEPARTMENT_OTHER)

## 2011-12-30 ENCOUNTER — Ambulatory Visit (HOSPITAL_BASED_OUTPATIENT_CLINIC_OR_DEPARTMENT_OTHER): Admitting: Nurse Practitioner

## 2011-12-30 ENCOUNTER — Other Ambulatory Visit (HOSPITAL_BASED_OUTPATIENT_CLINIC_OR_DEPARTMENT_OTHER): Admitting: Lab

## 2011-12-30 VITALS — BP 134/76 | HR 66 | Temp 97.0°F | Ht 60.0 in | Wt 226.7 lb

## 2011-12-30 DIAGNOSIS — C189 Malignant neoplasm of colon, unspecified: Secondary | ICD-10-CM

## 2011-12-30 DIAGNOSIS — D72819 Decreased white blood cell count, unspecified: Secondary | ICD-10-CM

## 2011-12-30 DIAGNOSIS — D6959 Other secondary thrombocytopenia: Secondary | ICD-10-CM

## 2011-12-30 DIAGNOSIS — Z5111 Encounter for antineoplastic chemotherapy: Secondary | ICD-10-CM

## 2011-12-30 DIAGNOSIS — D649 Anemia, unspecified: Secondary | ICD-10-CM

## 2011-12-30 DIAGNOSIS — C20 Malignant neoplasm of rectum: Secondary | ICD-10-CM

## 2011-12-30 LAB — COMPREHENSIVE METABOLIC PANEL
ALT: 20 U/L (ref 0–35)
Alkaline Phosphatase: 120 U/L — ABNORMAL HIGH (ref 39–117)
CO2: 25 mEq/L (ref 19–32)
Creatinine, Ser: 1.05 mg/dL (ref 0.50–1.10)
Total Bilirubin: 0.4 mg/dL (ref 0.3–1.2)

## 2011-12-30 LAB — CBC WITH DIFFERENTIAL/PLATELET
BASO%: 0.3 % (ref 0.0–2.0)
EOS%: 0.9 % (ref 0.0–7.0)
Eosinophils Absolute: 0 10*3/uL (ref 0.0–0.5)
LYMPH%: 17.6 % (ref 14.0–49.7)
MCH: 31.8 pg (ref 25.1–34.0)
MCHC: 32.2 g/dL (ref 31.5–36.0)
MCV: 98.7 fL (ref 79.5–101.0)
MONO%: 15.8 % — ABNORMAL HIGH (ref 0.0–14.0)
Platelets: 108 10*3/uL — ABNORMAL LOW (ref 145–400)
RBC: 3.09 10*6/uL — ABNORMAL LOW (ref 3.70–5.45)
nRBC: 0 % (ref 0–0)

## 2011-12-30 MED ORDER — DEXAMETHASONE SODIUM PHOSPHATE 10 MG/ML IJ SOLN
10.0000 mg | Freq: Once | INTRAMUSCULAR | Status: AC
  Administered 2011-12-30: 10 mg via INTRAVENOUS

## 2011-12-30 MED ORDER — HEPARIN SOD (PORK) LOCK FLUSH 100 UNIT/ML IV SOLN
500.0000 [IU] | Freq: Once | INTRAVENOUS | Status: DC | PRN
  Filled 2011-12-30: qty 5

## 2011-12-30 MED ORDER — FLUOROURACIL CHEMO INJECTION 2.5 GM/50ML
400.0000 mg/m2 | Freq: Once | INTRAVENOUS | Status: AC
  Administered 2011-12-30: 850 mg via INTRAVENOUS
  Filled 2011-12-30: qty 17

## 2011-12-30 MED ORDER — SODIUM CHLORIDE 0.9 % IJ SOLN
10.0000 mL | INTRAMUSCULAR | Status: DC | PRN
  Filled 2011-12-30: qty 10

## 2011-12-30 MED ORDER — LEUCOVORIN CALCIUM INJECTION 350 MG
400.0000 mg/m2 | Freq: Once | INTRAVENOUS | Status: AC
  Administered 2011-12-30: 828 mg via INTRAVENOUS
  Filled 2011-12-30: qty 41.4

## 2011-12-30 MED ORDER — DEXTROSE 5 % IV SOLN
Freq: Once | INTRAVENOUS | Status: AC
  Administered 2011-12-30: 12:00:00 via INTRAVENOUS

## 2011-12-30 MED ORDER — OXALIPLATIN CHEMO INJECTION 100 MG/20ML
85.0000 mg/m2 | Freq: Once | INTRAVENOUS | Status: AC
  Administered 2011-12-30: 175 mg via INTRAVENOUS
  Filled 2011-12-30: qty 35

## 2011-12-30 MED ORDER — ONDANSETRON 8 MG/50ML IVPB (CHCC)
8.0000 mg | Freq: Once | INTRAVENOUS | Status: AC
  Administered 2011-12-30: 8 mg via INTRAVENOUS

## 2011-12-30 MED ORDER — SODIUM CHLORIDE 0.9 % IV SOLN
2400.0000 mg/m2 | INTRAVENOUS | Status: DC
  Administered 2011-12-30: 4950 mg via INTRAVENOUS
  Filled 2011-12-30: qty 99

## 2011-12-30 NOTE — Patient Instructions (Signed)
[facility] Discharge Instructions for Patients Receiving Chemotherapy  Today you received the following chemotherapy agents Oxaliplatin, Leucovorin, 5FU  To help prevent nausea and vomiting after your treatment, we encourage you to take your nausea medication Begin taking it at 7 pm and take it as often as prescribed for the next 24 to 72 hours.   If you develop nausea and vomiting that is not controlled by your nausea medication, call the clinic. If it is after clinic hours your family physician or the after hours number for the clinic or go to the Emergency Department.   BELOW ARE SYMPTOMS THAT SHOULD BE REPORTED IMMEDIATELY:  *FEVER GREATER THAN 100.5 F  *CHILLS WITH OR WITHOUT FEVER  NAUSEA AND VOMITING THAT IS NOT CONTROLLED WITH YOUR NAUSEA MEDICATION  *UNUSUAL SHORTNESS OF BREATH  *UNUSUAL BRUISING OR BLEEDING  TENDERNESS IN MOUTH AND THROAT WITH OR WITHOUT PRESENCE OF ULCERS  *URINARY PROBLEMS  *BOWEL PROBLEMS  UNUSUAL RASH Items with * indicate a potential emergency and should be followed up as soon as possible.  One of the nurses will contact you 24 hours after your treatment. Please let the nurse know about any problems that you may have experienced. Feel free to call the clinic you have any questions or concerns. The clinic phone number is [phone].   I have been informed and understand all the instructions given to me. I know to contact the clinic, my physician, or go to the Emergency Department if any problems should occur. I do not have any questions at this time, but understand that I may call the clinic during office hours   should I have any questions or need assistance in obtaining follow up care.    __________________________________________  _____________  __________ Signature of Patient or Authorized Representative            Date                   Time    __________________________________________ Nurse's Signature

## 2011-12-30 NOTE — Progress Notes (Signed)
OFFICE PROGRESS NOTE  Interval history:  Jasmine Clayton returns as scheduled. She was last treated with FOLFOX on 12/16/2011. She denies nausea/vomiting. No mouth sores. No diarrhea. Cold sensitivity lasted approximately 5 days. She intermittently notes numbness in the fingertips. The numbness does not interfere with activity. On 12/27/2011 she fell through a step on her deck and bruised the right thigh and right calf.   Objective: Blood pressure 134/76, pulse 66, temperature 97 F (36.1 C), temperature source Oral, height 5' (1.524 m), weight 226 lb 11.2 oz (102.83 kg), last menstrual period 11/30/2009.  Oropharynx is without thrush or ulceration. Lungs are clear. Regular cardiac rhythm. Port-A-Cath site is without erythema. Abdomen is soft and nontender. No hepatomegaly. Extremities are without edema. Large ecchymosis at the right lateral thigh and right medial calf. Vibratory sense is mildly decreased over the fingertips bilaterally per tuning fork exam.  Lab Results: Lab Results  Component Value Date   WBC 2.6* 12/30/2011   HGB 9.8* 12/30/2011   HCT 30.5* 12/30/2011   MCV 98.7 12/30/2011   PLT 108* 12/30/2011    Chemistry:    Chemistry      Component Value Date/Time   NA 140 12/30/2011 1011   K 3.8 12/30/2011 1011   CL 107 12/30/2011 1011   CO2 25 12/30/2011 1011   BUN 13 12/30/2011 1011   CREATININE 1.05 12/30/2011 1011   CREATININE 0.98 03/15/2011 0828      Component Value Date/Time   CALCIUM 8.9 12/30/2011 1011   ALKPHOS 120* 12/30/2011 1011   AST 16 12/30/2011 1011   ALT 20 12/30/2011 1011   BILITOT 0.4 12/30/2011 1011       Studies/Results: No results found.  Medications: I have reviewed the patient's current medications.  Assessment/Plan:  1. 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 She began adjuvant FOLFOX chemotherapy 07/26/2011. She completed  cycle 6 on 10/07/2011. She began radiation and continuous infusion 5-fluorouracil on 10/25/2011, completed 12/01/2011. FOLFOX resumed for an additional 3 cycles beginning 12/16/2011. 2. 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 3. Open abdominal wound. The wound has healed. 4. History of a DVT while pregnant in 1979. 5. Normal preoperative CEA (1.0 on 05/14/2011). 6. Family history of cancer including endometrial cancer in her mother and colon cancer in her sister. The tumor was submitted for microsatellite instability testing. The tumor showed no evidence of microsatellite instability by PCR. 7. Microcytic anemia. Ferritin was normal at 78 on 09/23/2011. Hemoglobin is stable. 8. Thrombocytopenia secondary to chemotherapy. The platelet count is in normal range today. 9. Diarrhea secondary to radiation and chemotherapy. Resolved. 10. Right knee discomfort status post orthopedic evaluation. She had a recent cortisone injection and is taking meloxicam daily. 11. Mild leukopenia/thrombocytopenia secondary to chemotherapy.  Disposition-Ms. Temples appear stable. Plan to proceed with FOLFOX chemotherapy today as scheduled. She will return for a followup visit and the final cycle of FOLFOX in 2 weeks. She will contact the office in the interim with any problems.  Plan reviewed with Dr. Truett Perna.   Lonna Cobb ANP/GNP-BC

## 2012-01-01 ENCOUNTER — Ambulatory Visit (HOSPITAL_BASED_OUTPATIENT_CLINIC_OR_DEPARTMENT_OTHER)

## 2012-01-01 VITALS — BP 125/76 | HR 76 | Temp 98.8°F

## 2012-01-01 DIAGNOSIS — C189 Malignant neoplasm of colon, unspecified: Secondary | ICD-10-CM

## 2012-01-01 DIAGNOSIS — Z452 Encounter for adjustment and management of vascular access device: Secondary | ICD-10-CM

## 2012-01-01 MED ORDER — HEPARIN SOD (PORK) LOCK FLUSH 100 UNIT/ML IV SOLN
500.0000 [IU] | Freq: Once | INTRAVENOUS | Status: AC | PRN
  Administered 2012-01-01: 500 [IU]
  Filled 2012-01-01: qty 5

## 2012-01-01 MED ORDER — SODIUM CHLORIDE 0.9 % IJ SOLN
10.0000 mL | INTRAMUSCULAR | Status: DC | PRN
  Administered 2012-01-01: 10 mL
  Filled 2012-01-01: qty 10

## 2012-01-04 ENCOUNTER — Encounter: Payer: Self-pay | Admitting: Radiation Oncology

## 2012-01-04 ENCOUNTER — Ambulatory Visit
Admission: RE | Admit: 2012-01-04 | Discharge: 2012-01-04 | Disposition: A | Discharge status: Home / Self Care | Source: Ambulatory Visit | Attending: Radiation Oncology | Admitting: Radiation Oncology

## 2012-01-04 VITALS — BP 132/86 | HR 73 | Resp 18 | Wt 226.7 lb

## 2012-01-04 DIAGNOSIS — C189 Malignant neoplasm of colon, unspecified: Secondary | ICD-10-CM

## 2012-01-04 NOTE — Progress Notes (Signed)
Followup note:  Jasmine Clayton visits today approximately 5 weeks following completion of region therapy along with chemotherapy in the postoperative management of her T1 N1 adenocarcinoma of the proximal rectum. She continues to do well, although she did suffer a contusion of her right lower extremity after falling off a step at home. She tells me she will finish her chemotherapy on June 13. She will see Dr. Dwain Sarna back on July 22 for a followup visit and scheduling for reversal of her colostomy. She is not having any skin issues along the pelvis or perineum.  Physical examination: She is not examined today.  Impression: Satisfactory progress.  Plan: She'll maintain her followup with Dr. Truett Perna and Dr. Dwain Sarna.

## 2012-01-04 NOTE — Progress Notes (Signed)
HERE TODAY FOR FU OF TX OF PROXIMAL RECTUM.  SAYS SKIN IS DOING WELL AT [facility].  HAD A FALL MOTHER'S DAY WEEK-END AND BRUISED HER RIGHT LEG PRETTY BAD.  LAST CHEMO TX WILL  BE June 13TH THEN SHE WILL SEE DR. Dwain Sarna FOR PLANS TO REVERSE COLOSTOMY.  NO C/O OF PROBLEMS WITH POST RADIATION.  NO C/O PAIN.  APPETITE OK, NO CHANGES.

## 2012-01-12 ENCOUNTER — Other Ambulatory Visit: Payer: Self-pay | Admitting: Oncology

## 2012-01-13 ENCOUNTER — Ambulatory Visit (HOSPITAL_BASED_OUTPATIENT_CLINIC_OR_DEPARTMENT_OTHER)

## 2012-01-13 ENCOUNTER — Other Ambulatory Visit (HOSPITAL_BASED_OUTPATIENT_CLINIC_OR_DEPARTMENT_OTHER): Admitting: Lab

## 2012-01-13 ENCOUNTER — Ambulatory Visit (HOSPITAL_BASED_OUTPATIENT_CLINIC_OR_DEPARTMENT_OTHER): Admitting: Oncology

## 2012-01-13 ENCOUNTER — Other Ambulatory Visit: Payer: Self-pay | Admitting: *Deleted

## 2012-01-13 VITALS — BP 121/72 | HR 68 | Temp 97.5°F | Ht 60.0 in | Wt 229.4 lb

## 2012-01-13 DIAGNOSIS — C20 Malignant neoplasm of rectum: Secondary | ICD-10-CM

## 2012-01-13 DIAGNOSIS — C189 Malignant neoplasm of colon, unspecified: Secondary | ICD-10-CM

## 2012-01-13 DIAGNOSIS — D509 Iron deficiency anemia, unspecified: Secondary | ICD-10-CM

## 2012-01-13 DIAGNOSIS — Z5111 Encounter for antineoplastic chemotherapy: Secondary | ICD-10-CM

## 2012-01-13 DIAGNOSIS — Z859 Personal history of malignant neoplasm, unspecified: Secondary | ICD-10-CM

## 2012-01-13 DIAGNOSIS — D696 Thrombocytopenia, unspecified: Secondary | ICD-10-CM

## 2012-01-13 LAB — CBC WITH DIFFERENTIAL/PLATELET
BASO%: 0.5 % (ref 0.0–2.0)
HCT: 31.8 % — ABNORMAL LOW (ref 34.8–46.6)
LYMPH%: 18.3 % (ref 14.0–49.7)
MCH: 31.2 pg (ref 25.1–34.0)
MCHC: 32.4 g/dL (ref 31.5–36.0)
MONO#: 0.6 10*3/uL (ref 0.1–0.9)
NEUT%: 66.6 % (ref 38.4–76.8)
Platelets: 87 10*3/uL — ABNORMAL LOW (ref 145–400)
WBC: 4.1 10*3/uL (ref 3.9–10.3)

## 2012-01-13 LAB — COMPREHENSIVE METABOLIC PANEL
AST: 16 U/L (ref 0–37)
BUN: 14 mg/dL (ref 6–23)
Calcium: 9.3 mg/dL (ref 8.4–10.5)
Chloride: 104 mEq/L (ref 96–112)
Creatinine, Ser: 0.91 mg/dL (ref 0.50–1.10)

## 2012-01-13 MED ORDER — OXALIPLATIN CHEMO INJECTION 100 MG/20ML
85.0000 mg/m2 | Freq: Once | INTRAVENOUS | Status: AC
  Administered 2012-01-13: 175 mg via INTRAVENOUS
  Filled 2012-01-13: qty 35

## 2012-01-13 MED ORDER — LEUCOVORIN CALCIUM INJECTION 350 MG
400.0000 mg/m2 | Freq: Once | INTRAVENOUS | Status: AC
  Administered 2012-01-13: 828 mg via INTRAVENOUS
  Filled 2012-01-13: qty 41.4

## 2012-01-13 MED ORDER — OXYCODONE-ACETAMINOPHEN 5-325 MG PO TABS: 0.5000 | ORAL_TABLET | Freq: Four times a day (QID) | ORAL | Status: DC | PRN

## 2012-01-13 MED ORDER — ONDANSETRON 8 MG/50ML IVPB (CHCC)
8.0000 mg | Freq: Once | INTRAVENOUS | Status: AC
  Administered 2012-01-13: 8 mg via INTRAVENOUS

## 2012-01-13 MED ORDER — POTASSIUM CHLORIDE CRYS ER 20 MEQ PO TBCR: 20.0000 meq | EXTENDED_RELEASE_TABLET | Freq: Two times a day (BID) | ORAL | Status: DC

## 2012-01-13 MED ORDER — DEXTROSE 5 % IV SOLN
Freq: Once | INTRAVENOUS | Status: AC
  Administered 2012-01-13: 10:00:00 via INTRAVENOUS

## 2012-01-13 MED ORDER — DEXAMETHASONE SODIUM PHOSPHATE 10 MG/ML IJ SOLN
10.0000 mg | Freq: Once | INTRAMUSCULAR | Status: AC
  Administered 2012-01-13: 10 mg via INTRAVENOUS

## 2012-01-13 MED ORDER — SODIUM CHLORIDE 0.9 % IV SOLN
2400.0000 mg/m2 | INTRAVENOUS | Status: DC
  Administered 2012-01-13: 4950 mg via INTRAVENOUS
  Filled 2012-01-13: qty 99

## 2012-01-13 MED ORDER — FLUOROURACIL CHEMO INJECTION 2.5 GM/50ML
400.0000 mg/m2 | Freq: Once | INTRAVENOUS | Status: AC
  Administered 2012-01-13: 850 mg via INTRAVENOUS
  Filled 2012-01-13: qty 17

## 2012-01-13 NOTE — Progress Notes (Signed)
   [facility]    OFFICE PROGRESS NOTE   INTERVAL HISTORY:   She returns as scheduled. She completed another cycle of FOLFOX on may 30th 2013. No nausea, mouth sores, or diarrhea. Mild cold sensitivity and numbness/tingling in the fingers. This has resolved.  Objective:  Vital signs in last 24 hours:  Blood pressure 121/72, pulse 68, temperature 97.5 F (36.4 C), temperature source Oral, height 5' (1.524 m), weight 229 lb 6.4 oz (104.055 kg), last menstrual period 11/30/2009.    HEENT: No thrush or ulcers Lymphatics: No cervical, supraclavicular, axillary, or inguinal nodes Resp: Lungs clear bilaterally Cardio: Regular rate and rhythm GI: No hepatomegaly, no mass Vascular: No leg edema Neuro: The vibratory sense is intact at the fingertip bilaterally  Skin: Hyperpigmentation over the hands   Portacath/PICC-without erythema  Lab Results:  Lab Results  Component Value Date   WBC 4.1 01/13/2012   HGB 10.3* 01/13/2012   HCT 31.8* 01/13/2012   MCV 96.4 01/13/2012   PLT 87* 01/13/2012   ANC 2.7   Medications: I have reviewed the patient's current medications.  Assessment/Plan: 1. 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 She began adjuvant FOLFOX chemotherapy 07/26/2011. She completed cycle 6 on 10/07/2011. She began radiation and continuous infusion 5-fluorouracil on 10/25/2011, completed 12/01/2011. FOLFOX resumed for an additional 3 cycles beginning 12/16/2011. 2. 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 3. Open abdominal wound. The wound has healed. 4. History of a DVT while pregnant in 1979. 5. Normal preoperative CEA (1.0 on 05/14/2011). 6. Family history of cancer including endometrial cancer in her mother and colon cancer in her sister. The tumor was submitted for microsatellite  instability testing. The tumor showed no evidence of microsatellite instability by PCR. 7. Microcytic anemia. Ferritin was normal at 78 on 09/23/2011. Hemoglobin is stable. 8. Thrombocytopenia secondary to chemotherapy. She has been treated with FOLFOX with a platelet count at this level in the past. 9. Diarrhea secondary to radiation and chemotherapy. Resolved. 10. Right knee discomfort status post orthopedic evaluation. She had a recent cortisone injection and is taking meloxicam daily.    Disposition:  Ms. Greenlaw appears stable. She will complete a final planned cycle of chemotherapy today. We will refer her to Dr. Dwain Sarna to plan the colostomy reversal. Ms. Cottam will return for an office visit and Port-A-Cath flush in 6 weeks. She will be due for surveillance CTs in September or October of 2013 unless Dr. Dwain Sarna would like to obtain scans prior to surgery.   Thornton Papas, MD  01/13/2012  5:48 PM

## 2012-01-13 NOTE — Patient Instructions (Signed)
[facility] Discharge Instructions for Patients Receiving Chemotherapy  Today you received the following chemotherapy agents OXALIPLATIN, 5FU, LEUCOVORIN  To help prevent nausea and vomiting after your treatment, we encourage you to take your nausea medication. Begin taking it at 7PM and take it as often as prescribed for the next 24-72hours.   If you develop nausea and vomiting that is not controlled by your nausea medication, call the clinic. If it is after clinic hours your family physician or the after hours number for the clinic or go to the Emergency Department.   BELOW ARE SYMPTOMS THAT SHOULD BE REPORTED IMMEDIATELY:  *FEVER GREATER THAN 100.5 F  *CHILLS WITH OR WITHOUT FEVER  NAUSEA AND VOMITING THAT IS NOT CONTROLLED WITH YOUR NAUSEA MEDICATION  *UNUSUAL SHORTNESS OF BREATH  *UNUSUAL BRUISING OR BLEEDING  TENDERNESS IN MOUTH AND THROAT WITH OR WITHOUT PRESENCE OF ULCERS  *URINARY PROBLEMS  *BOWEL PROBLEMS  UNUSUAL RASH Items with * indicate a potential emergency and should be followed up as soon as possible.  One of the nurses will contact you 24 hours after your treatment. Please let the nurse know about any problems that you may have experienced. Feel free to call the clinic you have any questions or concerns. The clinic phone number is [phone].   I have been informed and understand all the instructions given to me. I know to contact the clinic, my physician, or go to the Emergency Department if any problems should occur. I do not have any questions at this time, but understand that I may call the clinic during office hours   should I have any questions or need assistance in obtaining follow up care.    __________________________________________  _____________  __________ Signature of Patient or Authorized Representative            Date                   Time    __________________________________________ Nurse's Signature    

## 2012-01-13 NOTE — Progress Notes (Signed)
Per Dr. Truett Perna: OK to treat today despite platelet count of 87.

## 2012-01-15 ENCOUNTER — Ambulatory Visit (HOSPITAL_BASED_OUTPATIENT_CLINIC_OR_DEPARTMENT_OTHER)

## 2012-01-15 VITALS — BP 121/77 | HR 74 | Temp 98.4°F

## 2012-01-15 DIAGNOSIS — C189 Malignant neoplasm of colon, unspecified: Secondary | ICD-10-CM

## 2012-01-15 DIAGNOSIS — C20 Malignant neoplasm of rectum: Secondary | ICD-10-CM

## 2012-01-15 DIAGNOSIS — Z452 Encounter for adjustment and management of vascular access device: Secondary | ICD-10-CM

## 2012-01-15 MED ORDER — HEPARIN SOD (PORK) LOCK FLUSH 100 UNIT/ML IV SOLN
500.0000 [IU] | Freq: Once | INTRAVENOUS | Status: AC | PRN
  Administered 2012-01-15: 500 [IU]
  Filled 2012-01-15: qty 5

## 2012-01-15 MED ORDER — SODIUM CHLORIDE 0.9 % IJ SOLN
10.0000 mL | INTRAMUSCULAR | Status: DC | PRN
  Administered 2012-01-15: 10 mL
  Filled 2012-01-15: qty 10

## 2012-01-18 ENCOUNTER — Ambulatory Visit: Attending: Gynecologic Oncology | Admitting: Gynecologic Oncology

## 2012-01-18 ENCOUNTER — Encounter: Payer: Self-pay | Admitting: Gynecologic Oncology

## 2012-01-18 VITALS — BP 110/70 | HR 68 | Temp 98.1°F | Resp 18 | Ht 60.75 in | Wt 226.3 lb

## 2012-01-18 DIAGNOSIS — Z96649 Presence of unspecified artificial hip joint: Secondary | ICD-10-CM | POA: Insufficient documentation

## 2012-01-18 DIAGNOSIS — Z90710 Acquired absence of both cervix and uterus: Secondary | ICD-10-CM | POA: Insufficient documentation

## 2012-01-18 DIAGNOSIS — Z86718 Personal history of other venous thrombosis and embolism: Secondary | ICD-10-CM | POA: Insufficient documentation

## 2012-01-18 DIAGNOSIS — Z923 Personal history of irradiation: Secondary | ICD-10-CM | POA: Insufficient documentation

## 2012-01-18 DIAGNOSIS — D279 Benign neoplasm of unspecified ovary: Secondary | ICD-10-CM | POA: Insufficient documentation

## 2012-01-18 DIAGNOSIS — Z9049 Acquired absence of other specified parts of digestive tract: Secondary | ICD-10-CM | POA: Insufficient documentation

## 2012-01-18 DIAGNOSIS — C189 Malignant neoplasm of colon, unspecified: Secondary | ICD-10-CM | POA: Insufficient documentation

## 2012-01-18 DIAGNOSIS — Z9079 Acquired absence of other genital organ(s): Secondary | ICD-10-CM | POA: Insufficient documentation

## 2012-01-18 DIAGNOSIS — Z933 Colostomy status: Secondary | ICD-10-CM | POA: Insufficient documentation

## 2012-01-18 DIAGNOSIS — Z9221 Personal history of antineoplastic chemotherapy: Secondary | ICD-10-CM | POA: Insufficient documentation

## 2012-01-18 DIAGNOSIS — D3910 Neoplasm of uncertain behavior of unspecified ovary: Secondary | ICD-10-CM

## 2012-01-18 NOTE — Patient Instructions (Signed)
follow up with Dr. Macon Large in 6 months and GYN Oncology in 12 months.

## 2012-01-18 NOTE — Progress Notes (Signed)
Office Visit:  Gyn Oncology  REASON FOR VISIT: Surveillance for low malignant potential tumor.   HISTORY OF PRESENT ILLNESS: This is a 61 year old, last normal  menstrual in 1998, who fell off a ladder and sustained a laceration to  the right lower quadrant. Emergency room evaluation included a CT scan  of the abdomen which noted the presence of 12.6 x 9.2 x 11.3 cm mass.  She underwent a total abdominal hysterectomy, bilateral salpingo-  oophorectomy and intragastric omentectomy on Dec 09, 2009. 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   Past Medical History  Diagnosis Date  . DVT (deep vein thrombosis) in pregnancy 1979    during pregnancy. lower extremity  . Stress incontinence, female   . Morbid obesity   . Degenerative joint disease   . Adenocarcinoma of colon 04/12/2011  . 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 History of chemotherapy      cycle 5 Folfox completed 09/23/11  . History of radiation therapy 10/25/11 -12/01/11    pelvis/presacral region   Past Surgical History  Procedure Date  . Total abdominal hysterectomy w/ bilateral salpingoophorectomy 11/30/09    BSO  . Total hip arthroplasty 2002    left hip  . Mass excision 2011    "mass removed from ovaries"  . Cesarean section   . Breast cyst excision   . Keloid excision 1982  . Joint replacement 2002    hip replacement  . Colon surgery     LAR, reoperation for necrotic ileostomy, transverse colostomy  . Colostomy 05/27/11  . Abdominal hysterectomy   . Portacath placement 07/26/11    right portacath   History   Social  History  . Marital Status: Married    Spouse Name: N/A    Number of Children: 1  . Years of Education: N/A   Occupational History  . 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 Smoking status: Never Smoker   . Smokeless tobacco: Never Used  . Alcohol Use: 3.5 oz/week    7 drink(s) per week  . Drug Use: No  . Sexually Active: Not on file   Other Topics Concern  . Not on file   Social History Narrative   Regular exercise:  NoCaffeine Use:  1 cup coffee dailyMarried- lives with husband.  Daughter, son-in-law and her 2 children.  Completed the 12th grade, and some college.   REVIEW OF SYSTEMS: Ten-point review of systems noteworthy for weight loss decreased appetite soft stool from the ostomy that is functioning well. She reports residual ulceration of her mons the from the radiotherapy.  Her appetite is poor but denies nausea or vomiting she denies vaginal bleeding  or rectal bleeding.  No shortness of breath or chest pain.   PHYSICAL EXAMINATION: GENERAL: Well-developed, obese female; in no  acute distress.  BP 110/70  Pulse 68  Temp 98.1 F (36.7 C) (Oral)  Resp 18  Ht 5' 0.75" (1.543 m)  Wt 226 lb 4.8 oz (102.649 kg)  BMI 43.11 kg/m2  LMP 11/30/2009 ABDOMEN: Soft, nontender, keloid formation noted at the site of  laceration in the right lower quadrant,  nontender. The  mucosa of the colostomy is within normal limits there is air and soft stool within the bag. BACK:  No CVAT LN.  No cervical supraclavicular or inguinal adenopathy PELVIC EXAMINATION: Normal external genitalia,  Healing ulcerations on the mons and clitoral area.Normal  Bartholin's, urethral  and Skene's. Atrophic vagina. No masses. No discharge, no bleeding.  No nodularity in the cul-de-sac.  RECTAL EXAMINATION:Deferred. EXTREMITIES: A 1 to 2+ lower extremity edema bilaterally.   ASSESSMENT/PLAN  1. Stage IA low malignant potential tumor. She has been advised to follow up    with Dr. Macon Large in 6 months and GYN Oncology in 12 months.

## 2012-01-26 ENCOUNTER — Other Ambulatory Visit (HOSPITAL_BASED_OUTPATIENT_CLINIC_OR_DEPARTMENT_OTHER): Admitting: Lab

## 2012-01-26 ENCOUNTER — Ambulatory Visit: Admitting: Nurse Practitioner

## 2012-01-26 DIAGNOSIS — C189 Malignant neoplasm of colon, unspecified: Secondary | ICD-10-CM

## 2012-01-26 LAB — CBC WITH DIFFERENTIAL/PLATELET
BASO%: 0.3 % (ref 0.0–2.0)
HCT: 31.4 % — ABNORMAL LOW (ref 34.8–46.6)
LYMPH%: 17.7 % (ref 14.0–49.7)
MCHC: 32.5 g/dL (ref 31.5–36.0)
MCV: 98.4 fL (ref 79.5–101.0)
MONO#: 0.6 10*3/uL (ref 0.1–0.9)
MONO%: 16.9 % — ABNORMAL HIGH (ref 0.0–14.0)
NEUT%: 64.5 % (ref 38.4–76.8)
Platelets: 74 10*3/uL — ABNORMAL LOW (ref 145–400)
RBC: 3.19 10*6/uL — ABNORMAL LOW (ref 3.70–5.45)
WBC: 3.6 10*3/uL — ABNORMAL LOW (ref 3.9–10.3)
nRBC: 0 % (ref 0–0)

## 2012-02-02 ENCOUNTER — Telehealth: Payer: Self-pay | Admitting: Oncology

## 2012-02-02 ENCOUNTER — Telehealth: Payer: Self-pay | Admitting: *Deleted

## 2012-02-02 DIAGNOSIS — C189 Malignant neoplasm of colon, unspecified: Secondary | ICD-10-CM

## 2012-02-02 NOTE — Telephone Encounter (Signed)
called pt with 7/10 lab appt  aom

## 2012-02-02 NOTE — Telephone Encounter (Signed)
Called pt with lab results from 6/26. She denies any new or unexplained bruises/ bleeding. She understands to expect call from schedulers with appt for lab around 02/09/12.

## 2012-02-09 ENCOUNTER — Other Ambulatory Visit (HOSPITAL_BASED_OUTPATIENT_CLINIC_OR_DEPARTMENT_OTHER): Admitting: Lab

## 2012-02-09 DIAGNOSIS — C189 Malignant neoplasm of colon, unspecified: Secondary | ICD-10-CM

## 2012-02-09 LAB — CBC WITH DIFFERENTIAL/PLATELET
BASO%: 0.3 % (ref 0.0–2.0)
Basophils Absolute: 0 10*3/uL (ref 0.0–0.1)
EOS%: 0.9 % (ref 0.0–7.0)
HCT: 33.7 % — ABNORMAL LOW (ref 34.8–46.6)
HGB: 10.9 g/dL — ABNORMAL LOW (ref 11.6–15.9)
MCH: 32.8 pg (ref 25.1–34.0)
MCHC: 32.5 g/dL (ref 31.5–36.0)
MCV: 101 fL (ref 79.5–101.0)
MONO%: 14.2 % — ABNORMAL HIGH (ref 0.0–14.0)
NEUT%: 69.4 % (ref 38.4–76.8)
RDW: 20 % — ABNORMAL HIGH (ref 11.2–14.5)

## 2012-02-21 ENCOUNTER — Encounter (INDEPENDENT_AMBULATORY_CARE_PROVIDER_SITE_OTHER): Payer: Self-pay | Admitting: General Surgery

## 2012-02-21 ENCOUNTER — Ambulatory Visit (INDEPENDENT_AMBULATORY_CARE_PROVIDER_SITE_OTHER): Admitting: General Surgery

## 2012-02-21 VITALS — BP 126/84 | HR 68 | Temp 98.1°F | Resp 20 | Ht 60.0 in | Wt 232.0 lb

## 2012-02-21 DIAGNOSIS — C189 Malignant neoplasm of colon, unspecified: Secondary | ICD-10-CM

## 2012-02-21 DIAGNOSIS — Z933 Colostomy status: Secondary | ICD-10-CM

## 2012-02-21 NOTE — Progress Notes (Signed)
Subjective:     Patient ID: Jasmine Clayton, female   DOB: 1951/01/05, 61 y.o.   MRN: [id number]  HPI 60yof with multiple medical problems who I know well from a difficult low anterior resection.  I did a proximal diversion with loop ileostomy due to a leak on insufflation.  This then necrosed in her abdominal wall and required reoperation with ileocecectomy and transverse colostomy.  Since then she has been doing well.  She has completed chemotherapy and xrt at this point.  She really had no difficulty with this.  Her stoma is working just fine. She has had some mucous and a small amount of stool out of her rectum.  Her appetite is normal.  She would like to have her stoma taken down.  Review of Systems     Objective:   Physical Exam Obese, abdomen with multiple scars, soft, transverse colostomy pink and functional    Assessment:     Rectal cancer Transverse colostomy    Plan:     She looks well now and she would like this reversed.  We discussed surgery, complications and recovery time.  I think this will be difficult due to her scarring as well as her habitus.  I am going to send her for an enema first to evaluate her distal colon.  Will discuss with Dr. Truett Perna also as I may send for ct prior to this as well.  I will see after these.

## 2012-02-23 ENCOUNTER — Other Ambulatory Visit (HOSPITAL_BASED_OUTPATIENT_CLINIC_OR_DEPARTMENT_OTHER): Admitting: Lab

## 2012-02-23 ENCOUNTER — Ambulatory Visit (HOSPITAL_BASED_OUTPATIENT_CLINIC_OR_DEPARTMENT_OTHER): Admitting: Nurse Practitioner

## 2012-02-23 ENCOUNTER — Ambulatory Visit

## 2012-02-23 ENCOUNTER — Telehealth: Payer: Self-pay | Admitting: Oncology

## 2012-02-23 VITALS — BP 154/92 | HR 69 | Temp 96.9°F | Ht 60.0 in | Wt 230.5 lb

## 2012-02-23 VITALS — BP 159/95 | HR 64 | Temp 98.0°F

## 2012-02-23 DIAGNOSIS — C189 Malignant neoplasm of colon, unspecified: Secondary | ICD-10-CM

## 2012-02-23 DIAGNOSIS — C20 Malignant neoplasm of rectum: Secondary | ICD-10-CM

## 2012-02-23 LAB — COMPREHENSIVE METABOLIC PANEL
AST: 18 U/L (ref 0–37)
Albumin: 3.5 g/dL (ref 3.5–5.2)
Alkaline Phosphatase: 117 U/L (ref 39–117)
BUN: 11 mg/dL (ref 6–23)
Creatinine, Ser: 1.01 mg/dL (ref 0.50–1.10)
Glucose, Bld: 99 mg/dL (ref 70–99)
Total Bilirubin: 0.4 mg/dL (ref 0.3–1.2)

## 2012-02-23 LAB — CBC WITH DIFFERENTIAL/PLATELET
Basophils Absolute: 0 10*3/uL (ref 0.0–0.1)
EOS%: 1.6 % (ref 0.0–7.0)
Eosinophils Absolute: 0.1 10*3/uL (ref 0.0–0.5)
HGB: 10.6 g/dL — ABNORMAL LOW (ref 11.6–15.9)
LYMPH%: 13.9 % — ABNORMAL LOW (ref 14.0–49.7)
MCH: 32.4 pg (ref 25.1–34.0)
MCV: 100 fL (ref 79.5–101.0)
MONO%: 11.4 % (ref 0.0–14.0)
NEUT#: 3.2 10*3/uL (ref 1.5–6.5)
NEUT%: 72.4 % (ref 38.4–76.8)
Platelets: 165 10*3/uL (ref 145–400)
RDW: 18.2 % — ABNORMAL HIGH (ref 11.2–14.5)

## 2012-02-23 MED ORDER — SODIUM CHLORIDE 0.9 % IJ SOLN
10.0000 mL | INTRAMUSCULAR | Status: DC | PRN
  Administered 2012-02-23: 10 mL via INTRAVENOUS
  Filled 2012-02-23: qty 10

## 2012-02-23 MED ORDER — HEPARIN SOD (PORK) LOCK FLUSH 100 UNIT/ML IV SOLN
500.0000 [IU] | Freq: Once | INTRAVENOUS | Status: AC
  Administered 2012-02-23: 500 [IU] via INTRAVENOUS
  Filled 2012-02-23: qty 5

## 2012-02-23 NOTE — Progress Notes (Signed)
OFFICE PROGRESS NOTE  Interval history:  Jasmine Clayton returns as scheduled. She feels well. She has a good appetite. No nausea or vomiting. No mouth sores. No diarrhea. She has mild numbness in the fingertips. The numbness does not interfere with activity.   Objective: Blood pressure 154/92, pulse 69, temperature 96.9 F (36.1 C), temperature source Oral, height 5' (1.524 m), weight 230 lb 8 oz (104.554 kg), last menstrual period 11/30/2009.  Oropharynx is without thrush or ulceration. Lungs are clear. Regular cardiac rhythm. Port-A-Cath site is without erythema. Abdomen is soft and nontender. No hepatomegaly. Extremities are without edema. Approximate 1 cm soft mobile subcutaneous lesion at the left temporal region.  Lab Results: Lab Results  Component Value Date   WBC 4.5 02/23/2012   HGB 10.6* 02/23/2012   HCT 32.8* 02/23/2012   MCV 100.0 02/23/2012   PLT 165 02/23/2012    Chemistry:    Chemistry      Component Value Date/Time   NA 138 01/13/2012 0907   K 4.0 01/13/2012 0907   CL 104 01/13/2012 0907   CO2 25 01/13/2012 0907   BUN 14 01/13/2012 0907   CREATININE 0.91 01/13/2012 0907   CREATININE 0.98 03/15/2011 0828      Component Value Date/Time   CALCIUM 9.3 01/13/2012 0907   ALKPHOS 136* 01/13/2012 0907   AST 16 01/13/2012 0907   ALT 16 01/13/2012 0907   BILITOT 0.4 01/13/2012 0907       Studies/Results: No results found.  Medications: I have reviewed the patient's current medications.  Assessment/Plan:  1. 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 She began adjuvant FOLFOX chemotherapy 07/26/2011. She completed cycle 6 on 10/07/2011. She began radiation and continuous infusion 5-fluorouracil on 10/25/2011, completed 12/01/2011. FOLFOX resumed for an additional 3 cycles beginning 12/16/2011. She completed the final cycle of chemotherapy  01/13/2012. 2. 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 3. Open abdominal wound. The wound has healed. 4. History of a DVT while pregnant in 1979. 5. Normal preoperative CEA (1.0 on 05/14/2011). Normal CEA (0.6)on 01/13/2012. 6. Family history of cancer including endometrial cancer in her mother and colon cancer in her sister. The tumor was submitted for microsatellite instability testing. The tumor showed no evidence of microsatellite instability by PCR. 7. Microcytic anemia. Ferritin was normal at 78 on 09/23/2011. Hemoglobin is stable. 8. History of thrombocytopenia secondary to chemotherapy. The platelet count is now in normal range. 9. Diarrhea secondary to radiation and chemotherapy. Resolved. 10. Right knee discomfort status post orthopedic evaluation. She had a recent cortisone injection and is taking meloxicam daily.  Disposition-Ms. Molstad appears stable. She is following up with Dr. Dwain Sarna regarding reversal of the colostomy. She will return for a Port-A-Cath flush in 6 weeks. She will return for a followup visit and CEA in 3 months. She will contact the office in the interim with any problems.  Plan reviewed with Dr. Truett Perna.  Lonna Cobb ANP/GNP-BC

## 2012-02-23 NOTE — Telephone Encounter (Signed)
Gave pt appt for September and October 2013 lab, flushes  and MD

## 2012-02-29 ENCOUNTER — Other Ambulatory Visit: Payer: Self-pay | Admitting: *Deleted

## 2012-02-29 DIAGNOSIS — C189 Malignant neoplasm of colon, unspecified: Secondary | ICD-10-CM

## 2012-03-02 ENCOUNTER — Telehealth: Payer: Self-pay | Admitting: Oncology

## 2012-03-02 ENCOUNTER — Other Ambulatory Visit: Payer: Self-pay | Admitting: Family

## 2012-03-02 ENCOUNTER — Telehealth (INDEPENDENT_AMBULATORY_CARE_PROVIDER_SITE_OTHER): Payer: Self-pay

## 2012-03-02 DIAGNOSIS — Z1231 Encounter for screening mammogram for malignant neoplasm of breast: Secondary | ICD-10-CM

## 2012-03-02 DIAGNOSIS — C20 Malignant neoplasm of rectum: Secondary | ICD-10-CM

## 2012-03-02 NOTE — Telephone Encounter (Signed)
Called pt to notify her that we have her scheduled for her xrays. I have her scheduled for DG colon at [facility] on 8/5 arrive at 10:45 go to [facility] Radiology. The next xray is scheduled for 8/7 at [facility] for 11:45 go to Radiology. The pt understands the directions. The pt will come by our office on Monday after her xray to p/u the contrast kit at the front desk where I will leave a blue bag with her name on it for her.

## 2012-03-02 NOTE — Telephone Encounter (Signed)
s.w pt and she is aware of her ekg on 9/4 with flush

## 2012-03-06 ENCOUNTER — Ambulatory Visit (HOSPITAL_COMMUNITY)
Admission: RE | Admit: 2012-03-06 | Discharge: 2012-03-06 | Disposition: A | Discharge status: Home / Self Care | Source: Ambulatory Visit | Attending: General Surgery | Admitting: General Surgery

## 2012-03-06 DIAGNOSIS — C189 Malignant neoplasm of colon, unspecified: Secondary | ICD-10-CM | POA: Insufficient documentation

## 2012-03-06 DIAGNOSIS — Z9049 Acquired absence of other specified parts of digestive tract: Secondary | ICD-10-CM | POA: Insufficient documentation

## 2012-03-06 DIAGNOSIS — Z933 Colostomy status: Secondary | ICD-10-CM | POA: Insufficient documentation

## 2012-03-06 DIAGNOSIS — Z980 Intestinal bypass and anastomosis status: Secondary | ICD-10-CM | POA: Insufficient documentation

## 2012-03-06 DIAGNOSIS — C20 Malignant neoplasm of rectum: Secondary | ICD-10-CM

## 2012-03-07 ENCOUNTER — Telehealth (INDEPENDENT_AMBULATORY_CARE_PROVIDER_SITE_OTHER): Payer: Self-pay | Admitting: General Surgery

## 2012-03-07 NOTE — Telephone Encounter (Signed)
Patient picked up contrast and was not given instructions for her CT scan. I went over instructions - not to eat 4 hours prior and to drink contrast at 2 hours and 1 hour prior to test. She expressed appreciation and will call with any additional questions.

## 2012-03-08 ENCOUNTER — Ambulatory Visit (HOSPITAL_COMMUNITY)
Admission: RE | Admit: 2012-03-08 | Discharge: 2012-03-08 | Disposition: A | Discharge status: Home / Self Care | Source: Ambulatory Visit | Attending: General Surgery | Admitting: General Surgery

## 2012-03-08 DIAGNOSIS — J984 Other disorders of lung: Secondary | ICD-10-CM | POA: Insufficient documentation

## 2012-03-08 DIAGNOSIS — Z96649 Presence of unspecified artificial hip joint: Secondary | ICD-10-CM | POA: Insufficient documentation

## 2012-03-08 DIAGNOSIS — C20 Malignant neoplasm of rectum: Secondary | ICD-10-CM | POA: Insufficient documentation

## 2012-03-08 DIAGNOSIS — Z933 Colostomy status: Secondary | ICD-10-CM | POA: Insufficient documentation

## 2012-03-08 DIAGNOSIS — K7689 Other specified diseases of liver: Secondary | ICD-10-CM | POA: Insufficient documentation

## 2012-03-08 MED ORDER — IOHEXOL 300 MG/ML  SOLN
100.0000 mL | Freq: Once | INTRAMUSCULAR | Status: AC | PRN
  Administered 2012-03-08: 100 mL via INTRAVENOUS

## 2012-03-17 ENCOUNTER — Ambulatory Visit (HOSPITAL_BASED_OUTPATIENT_CLINIC_OR_DEPARTMENT_OTHER)
Admission: RE | Admit: 2012-03-17 | Discharge: 2012-03-17 | Disposition: A | Discharge status: Home / Self Care | Source: Ambulatory Visit | Attending: Family | Admitting: Family

## 2012-03-17 DIAGNOSIS — Z1231 Encounter for screening mammogram for malignant neoplasm of breast: Secondary | ICD-10-CM

## 2012-03-20 ENCOUNTER — Encounter (INDEPENDENT_AMBULATORY_CARE_PROVIDER_SITE_OTHER): Payer: Self-pay | Admitting: General Surgery

## 2012-03-20 ENCOUNTER — Ambulatory Visit (INDEPENDENT_AMBULATORY_CARE_PROVIDER_SITE_OTHER): Admitting: General Surgery

## 2012-03-20 VITALS — BP 128/80 | HR 78 | Resp 16 | Ht 60.0 in | Wt 234.0 lb

## 2012-03-20 DIAGNOSIS — C189 Malignant neoplasm of colon, unspecified: Secondary | ICD-10-CM

## 2012-03-20 DIAGNOSIS — Z933 Colostomy status: Secondary | ICD-10-CM

## 2012-03-20 NOTE — Progress Notes (Signed)
Subjective:     Patient ID: Jasmine Clayton, female   DOB: [birth year], 61 y.o.   MRN: [id number]  HPI This is a 61 year old female I know well from a treatment for rectal cancer. She has completed all of her therapy at this point. I saw her in relation to her colostomy being taken down recently. I did send her here CT chest abdomen and pelvis for staging there's not any evidence of any recurrent or metastatic disease. She also has an enema that shows no  leak and this refluxes all the way to the colostomy. She comes back in today to discuss a takedown. She reports no interval complaints.  Review of Systems CT CHEST, ABDOMEN AND PELVIS WITH CONTRAST  Technique: Multidetector CT imaging of the chest, abdomen and  pelvis was performed following the standard protocol during bolus  administration of intravenous contrast.  Contrast: OMNIPAQUE IOHEXOL 300 MG/ML SOLN  Comparison: 04/14/2011  CT CHEST  Findings: No enlarged axillary or supraclavicular adenopathy.  There is no mediastinal or hilar adenopathy. No pericardial or  pleural effusion.  Scar like density is noted in the left lower lobe. No suspicious  pulmonary nodule or mass identified.  Review of the visualized bony structures is significant for  multilevel spondylosis. No aggressive lytic or sclerotic bone  lesion identified.  IMPRESSION:  1. No specific features identified to suggest metastatic disease.  CT ABDOMEN AND PELVIS  Findings: Cyst is identified within the left hepatic lobe  measuring 1.1 cm, image 52. No suspicious liver abnormalities  identified. The gallbladder appears normal. The pancreas is  unremarkable. The spleen is normal.  Both adrenal glands are normal. Normal appearance of the right  kidney. There are several low density structures within the left  kidney. These measure less than 1 cm and are too small to  characterize. Urinary bladder appears normal.  There is no adenopathy within the upper abdomen.  There is no  pelvic or inguinal adenopathy.  The stomach appears normal. The small bowel loops have a normal  caliber. There is no evidence for small bowel obstruction. There  is marked laxity of the ventral abdominal wall. This allows a  protuberance of nonobstructed loops of small bowel, large bowel and  stomach. Within this area of abdominal wall laxity there is a  "double barrel" colostomy. The proximal and distal colonic bowel  loops have a normal caliber without evidence for obstructing mass.  No free fluid or fluid collections identified within the abdomen or  pelvis. No suspicious peritoneal nodule or mass identified.  Review of the visualized osseous structures shows lumbar  spondylosis. No aggressive lytic or sclerotic lesions. The  patient has a left hip arthroplasty device.  IMPRESSION:  1. No acute findings identified within the abdomen or pelvis. No  evidence for metastatic disease.  2. Marked laxity of the ventral abdominal wall with protuberance  of non-obstructed bowel loops.  3. 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   WATER SOLUBLE CONTRAST ENEMA  Technique: Initial scout AP supine abdominal image was obtained.  Water soluble contrast was introduced into the colon in a  retrograde fashion and refluxed from the rectum to the cecum. Spot  images of the colon followed by overhead radiographs were obtained.  Fluoroscopy time: 1.24 minutes.  Comparison: Abdominal pelvic CT 04/14/2011.  Findings: The scout abdominal radiograph demonstrates a midline  ostomy. The bowel gas pattern is normal. There are degenerative  changes of the lumbar spine and a prior  left total hip  arthroplasty.  Water-soluble contrast administered per rectum rapidly filled a  small caliber colon and entered the transverse colostomy bag. There  is no extravasation. There is no filling of the proximal colon or  small bowel.  After the patient voided, the distal colon was  reevaluated  fluoroscopically with several spot images. No mucosal abnormality  is seen at the anastomosis. There is no extravasation.  IMPRESSION:  The distal colon appears unremarkable status post abdominal  perineal resection. The transverse colostomy is patent with  retrograde filling of the colostomy bag. No extravasation is  demonstrated.     Objective:   Physical Exam Colostomy present, morbidly obese    Assessment:     Rectal cancer Presence of colostomy    Plan:     We had  a long discussion with her today about the takedown of her colostomy. Her other operations performed were very difficult given her prior surgery as well as her body habitus and scar tissue. She is doing very well at this point. She very much would like an attempt at taking her colostomy down. I am going to discuss with Dr. Truett Perna possibly repeating her colonoscopy as it has almost  been a year since her treatment prior to doing this as well. If she decides that she would like to proceed with this we will plan on doing so. All plan on giving her a bowel prep. We discussed a 7-10 day hospitalization. I told her this would be very difficult surgery and there certainly risks involved with this. There is include, but are not limited to, bleeding, infection, open wound, hernia which I think she or he has and this could not be repaired at the same time necessitating an operation in the future, anastomotic leak requiring reoperation and possible colostomy again, DVT, PE, cardiac and pulmonary complications. She understands all of this and would like to proceed as soon as possible.

## 2012-03-21 ENCOUNTER — Telehealth (INDEPENDENT_AMBULATORY_CARE_PROVIDER_SITE_OTHER): Payer: Self-pay

## 2012-03-21 NOTE — Telephone Encounter (Signed)
Called Jasmine Clayton to let her know that Dr Dwain Sarna did speak with Dr Truett Perna about the colonoscopy. They both have agreed that the Jasmine Clayton needs to have a colonoscopy before Dr Dwain Sarna does anymore surgery. Dr Dwain Sarna will speak with Dr Russella Dar her GI doctor and we will get back with the Jasmine Clayton. The Jasmine Clayton understands.

## 2012-03-24 ENCOUNTER — Telehealth (INDEPENDENT_AMBULATORY_CARE_PROVIDER_SITE_OTHER): Payer: Self-pay

## 2012-03-24 ENCOUNTER — Telehealth: Payer: Self-pay | Admitting: Gastroenterology

## 2012-03-24 DIAGNOSIS — C20 Malignant neoplasm of rectum: Secondary | ICD-10-CM

## 2012-03-24 NOTE — Telephone Encounter (Signed)
Called to make appt with Dr Ardell Isaacs office but they are not sure whether he would want to see the pt first or just schedule the screening colonoscopy. They are going to check with Dr Russella Dar and then they will call the pt to make the appt. I will notify the pt of this plan.

## 2012-03-27 ENCOUNTER — Telehealth (INDEPENDENT_AMBULATORY_CARE_PROVIDER_SITE_OTHER): Payer: Self-pay

## 2012-03-27 ENCOUNTER — Encounter: Payer: Self-pay | Admitting: Gastroenterology

## 2012-03-27 ENCOUNTER — Encounter: Payer: Self-pay | Admitting: Family

## 2012-03-27 ENCOUNTER — Ambulatory Visit (INDEPENDENT_AMBULATORY_CARE_PROVIDER_SITE_OTHER)
Admission: RE | Admit: 2012-03-27 | Discharge: 2012-03-27 | Disposition: A | Discharge status: Home / Self Care | Source: Ambulatory Visit

## 2012-03-27 ENCOUNTER — Ambulatory Visit (INDEPENDENT_AMBULATORY_CARE_PROVIDER_SITE_OTHER): Admitting: Family

## 2012-03-27 VITALS — BP 128/80 | HR 66 | Temp 97.9°F | Resp 16 | Ht 60.5 in | Wt 235.1 lb

## 2012-03-27 DIAGNOSIS — Z0000 Encounter for general adult medical examination without abnormal findings: Secondary | ICD-10-CM

## 2012-03-27 DIAGNOSIS — Z780 Asymptomatic menopausal state: Secondary | ICD-10-CM

## 2012-03-27 DIAGNOSIS — Z85038 Personal history of other malignant neoplasm of large intestine: Secondary | ICD-10-CM

## 2012-03-27 LAB — HEPATIC FUNCTION PANEL
Albumin: 4 g/dL (ref 3.5–5.2)
Alkaline Phosphatase: 101 U/L (ref 39–117)
Total Bilirubin: 0.4 mg/dL (ref 0.3–1.2)
Total Protein: 6.5 g/dL (ref 6.0–8.3)

## 2012-03-27 LAB — CBC WITH DIFFERENTIAL/PLATELET
Eosinophils Absolute: 0.1 10*3/uL (ref 0.0–0.7)
Eosinophils Relative: 3 % (ref 0–5)
HCT: 33.6 % — ABNORMAL LOW (ref 36.0–46.0)
Lymphs Abs: 0.7 10*3/uL (ref 0.7–4.0)
MCH: 29.6 pg (ref 26.0–34.0)
MCV: 92.1 fL (ref 78.0–100.0)
Monocytes Absolute: 0.5 10*3/uL (ref 0.1–1.0)
Platelets: 216 10*3/uL (ref 150–400)
RBC: 3.65 MIL/uL — ABNORMAL LOW (ref 3.87–5.11)
RDW: 16 % — ABNORMAL HIGH (ref 11.5–15.5)

## 2012-03-27 LAB — TSH: TSH: 2.07 u[IU]/mL (ref 0.350–4.500)

## 2012-03-27 LAB — LIPID PANEL
HDL: 53 mg/dL (ref >39)
LDL Cholesterol: 101 mg/dL — ABNORMAL HIGH (ref 0–99)
Total CHOL/HDL Ratio: 3.3 Ratio

## 2012-03-27 NOTE — Patient Instructions (Addendum)
Please complete your blood work prior to leaving. Schedule bone density at the front desk.  Please check with your insurance to see if they will cover the zostavax (shingles shot).  Schedule a nurse visit if you would like to proceed. Follow up as needed.

## 2012-03-27 NOTE — Telephone Encounter (Signed)
Yes for direct colon. Dr.Wakefield and I discussed.

## 2012-03-27 NOTE — Telephone Encounter (Signed)
Dr. Russella Dar is it ok to schedule for direct colon.  Dr. Dwain Sarna is wanting to reverse her colostomy.

## 2012-03-27 NOTE — Progress Notes (Signed)
Subjective:    Patient ID: Jasmine Clayton, female    DOB: 1951/07/11, 61 y.o.   MRN: [id number]  HPI  Ms.  Clayton is a 61 yr old female who presents today for her annual CPX.  Pt reports last mammogram was August 6th and reports normal results.  Last pap smear in 2011- she is s/p TAH/BSO.  She is not following with GYN.  She reports that she tries to walk for exercise. She is scheduled for a colonoscopy this coming year.  She has never had a Dexa scan.    Adenocarcinoma colon- she is s/p colectomy/colostomy and is scheduled for colostomy and then hopes to have her colostomy taken down.   Review of Systems  Constitutional: Positive for unexpected weight change.  HENT: Negative for congestion.   Eyes: Negative for visual disturbance.  Respiratory: Negative for cough.   Cardiovascular: Negative for chest pain and leg swelling.  Gastrointestinal:       Colostomy   Genitourinary: Negative for dysuria, frequency and hematuria.  Musculoskeletal:       R knee arthritis pain  Skin: Negative for rash.  Neurological: Negative for headaches.  Hematological: Negative for adenopathy.  Psychiatric/Behavioral:       Denies depression/anxiety   Past Medical History  Diagnosis Date  . DVT (deep vein thrombosis) in pregnancy 1979    during pregnancy. lower extremity  . Stress incontinence, female   . Morbid obesity   . Degenerative joint disease   . Adenocarcinoma of colon 04/12/2011  . 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 History of chemotherapy      cycle 5 Folfox completed 09/23/11  . History of radiation therapy 10/25/11 -12/01/11    pelvis/presacral region    History   Social History  . Marital Status: Married    Spouse Name: N/A    Number of Children: 1  . Years of Education: N/A   Occupational History  . 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 Smoking status: Never  Smoker   . Smokeless tobacco: Never Used  . Alcohol Use: 3.5 oz/week    7 drink(s) per week  . Drug Use: No  . Sexually Active: Not on file   Other Topics Concern  . Not on file   Social History Narrative   Regular exercise:  NoCaffeine Use:  1 cup coffee dailyMarried- lives with husband.  Daughter, son-in-law and her 2 children- they recently moved outCompleted the 12th grade, and some college.Does not work.    Past Surgical History  Procedure Date  . Total abdominal hysterectomy w/ bilateral salpingoophorectomy 11/30/09    BSO  . Total hip arthroplasty 2002    left hip  . Mass excision 2011    "mass removed from ovaries"  . Cesarean section   . Breast cyst excision   . Keloid excision 1982  . Joint replacement 2002    hip replacement  . Colon surgery     LAR, reoperation for necrotic ileostomy, transverse colostomy  . Colostomy 05/27/11  . Abdominal hysterectomy   . Portacath placement 07/26/11    right portacath    Family History  Problem Relation Age of Onset  . Cancer Mother 27    endometrial cancer  . Diabetes Father   . Colon cancer Sister   . Cancer Sister 56    colon cancer currently undergoing chemotherapy  No Known Allergies  Current Outpatient Prescriptions on File Prior to Visit  Medication Sig Dispense Refill  . aspirin 81 MG tablet Take 81 mg by mouth daily.        . B Complex Vitamins (B-COMPLEX/B-12 PO) Take 1 tablet by mouth daily.        . calcium-vitamin D (OSCAL) 250-125 MG-UNIT per tablet Take 1 tablet by mouth daily.       . fish oil-omega-3 fatty acids 1000 MG capsule Take 1 g by mouth daily.        Marland Kitchen lidocaine-prilocaine (EMLA) cream Apply topically as needed. Apply to [facility] 2 hours prior to stick and cover with plastic wrap  30 g  prn  . meloxicam (MOBIC) 7.5 MG tablet Take 7.5 mg by mouth daily.      Marland Kitchen omeprazole (PRILOSEC OTC) 20 MG tablet Take 20 mg by mouth as needed.       Marland Kitchen oxyCODONE-acetaminophen (PERCOCET) 5-325 MG per tablet  Take 0.5-1 tablets by mouth every 6 (six) hours as needed for pain. One to two tabs po Q 6 hrs prn pain  60 tablet  0  . potassium chloride SA (K-DUR,KLOR-CON) 20 MEQ tablet Take 1 tablet (20 mEq total) by mouth 2 (two) times daily.  60 tablet  2  . tetrahydrozoline 0.05 % ophthalmic solution Place 1 drop into both eyes 2 (two) times daily as needed. For dry/irritated eyes (Visine)        BP 128/80  Pulse 66  Temp 97.9 F (36.6 C) (Oral)  Resp 16  Ht 5' 0.5" (1.537 m)  Wt 235 lb 1.3 oz (106.632 kg)  BMI 45.16 kg/m2  SpO2 99%  LMP 11/30/2009       Objective:   Physical Exam  Physical Exam  Constitutional: Overweight african [place] female. She is oriented to person, place, and time. She appears well-developed and well-nourished. No distress.  HENT:  Head: Normocephalic and atraumatic.  Right Ear: Tympanic membrane and ear canal normal.  Left Ear: Tympanic membrane and ear canal normal.  Mouth/Throat: Oropharynx is clear and moist.  Eyes: Pupils are reactive to light- difficult to assess due to colored contacts. No scleral icterus.  Neck: Normal range of motion. No thyromegaly present.  Cardiovascular: Normal rate and regular rhythm.   No murmur heard. Pulmonary/Chest: Effort normal and breath sounds normal. No respiratory distress. He has no wheezes. She has no rales. She exhibits no tenderness.  Abdominal: Soft. Bowel sounds are normal. (colostomy is noted) Musculoskeletal: She exhibits no edema.  Lymphadenopathy:    She has no cervical adenopathy.  Neurological: She is alert and oriented to person, place, and time. She has normal reflexes. She exhibits normal muscle tone. Coordination normal.  Skin: Skin is warm and dry.  Psychiatric: She has a normal mood and affect. Her behavior is normal. Judgment and thought content normal.  Breasts: Examined lying Right: Without masses, retractions, discharge or axillary adenopathy.  Left: Without masses, retractions, discharge or  axillary adenopathy.          Assessment & Plan:         Assessment & Plan:

## 2012-03-27 NOTE — Assessment & Plan Note (Signed)
Obtain fasting lab work.  Refer for dexa.   Pt will check with her insurance re: zostavax coverage and then call to schedule nurse visit.  Pt counseled on exercise/weight loss.

## 2012-03-27 NOTE — Telephone Encounter (Signed)
Patient is already scheduled.  I have spoke with her this am and she is aware of the appt details.  She will call back for any questions or concerns

## 2012-03-27 NOTE — Telephone Encounter (Signed)
LMOM for pt to call me back so I can let her know about Dr Ardell Isaacs office to call her.

## 2012-03-28 ENCOUNTER — Encounter: Payer: Self-pay | Admitting: Family

## 2012-03-28 LAB — URINALYSIS, MICROSCOPIC ONLY
Bacteria, UA: NONE SEEN
Casts: NONE SEEN
Crystals: NONE SEEN
Squamous Epithelial / LPF: NONE SEEN

## 2012-03-28 LAB — URINALYSIS, ROUTINE W REFLEX MICROSCOPIC
Ketones, ur: NEGATIVE mg/dL
Leukocytes, UA: NEGATIVE
Nitrite: NEGATIVE
Specific Gravity, Urine: 1.016 (ref 1.005–1.030)
Urobilinogen, UA: 0.2 mg/dL (ref 0.0–1.0)
pH: 5 (ref 5.0–8.0)

## 2012-04-05 ENCOUNTER — Other Ambulatory Visit: Payer: Self-pay

## 2012-04-05 ENCOUNTER — Encounter

## 2012-04-05 ENCOUNTER — Ambulatory Visit (HOSPITAL_BASED_OUTPATIENT_CLINIC_OR_DEPARTMENT_OTHER)

## 2012-04-05 VITALS — BP 125/80 | HR 75 | Temp 98.1°F | Resp 17

## 2012-04-05 DIAGNOSIS — C189 Malignant neoplasm of colon, unspecified: Secondary | ICD-10-CM

## 2012-04-05 DIAGNOSIS — Z452 Encounter for adjustment and management of vascular access device: Secondary | ICD-10-CM

## 2012-04-05 DIAGNOSIS — C20 Malignant neoplasm of rectum: Secondary | ICD-10-CM

## 2012-04-05 MED ORDER — HEPARIN SOD (PORK) LOCK FLUSH 100 UNIT/ML IV SOLN
500.0000 [IU] | Freq: Once | INTRAVENOUS | Status: AC
  Administered 2012-04-05: 500 [IU] via INTRAVENOUS
  Filled 2012-04-05: qty 5

## 2012-04-05 MED ORDER — SODIUM CHLORIDE 0.9 % IJ SOLN
10.0000 mL | INTRAMUSCULAR | Status: DC | PRN
  Administered 2012-04-05: 10 mL via INTRAVENOUS
  Filled 2012-04-05: qty 10

## 2012-04-13 ENCOUNTER — Ambulatory Visit (AMBULATORY_SURGERY_CENTER): Admitting: *Deleted

## 2012-04-13 VITALS — Ht 60.0 in | Wt 238.0 lb

## 2012-04-13 DIAGNOSIS — Z1211 Encounter for screening for malignant neoplasm of colon: Secondary | ICD-10-CM

## 2012-04-13 DIAGNOSIS — Z85038 Personal history of other malignant neoplasm of large intestine: Secondary | ICD-10-CM

## 2012-04-13 MED ORDER — MOVIPREP 100 G PO SOLR: ORAL | Status: DC

## 2012-05-03 ENCOUNTER — Other Ambulatory Visit: Payer: Self-pay | Admitting: Cardiology

## 2012-05-03 ENCOUNTER — Encounter (INDEPENDENT_AMBULATORY_CARE_PROVIDER_SITE_OTHER)

## 2012-05-03 DIAGNOSIS — R609 Edema, unspecified: Secondary | ICD-10-CM

## 2012-05-03 DIAGNOSIS — M79609 Pain in unspecified limb: Secondary | ICD-10-CM

## 2012-05-10 ENCOUNTER — Ambulatory Visit (AMBULATORY_SURGERY_CENTER): Admitting: Gastroenterology

## 2012-05-10 ENCOUNTER — Encounter: Payer: Self-pay | Admitting: Gastroenterology

## 2012-05-10 VITALS — BP 135/78 | HR 81 | Temp 98.0°F | Resp 38 | Ht 60.0 in | Wt 238.0 lb

## 2012-05-10 DIAGNOSIS — Z85038 Personal history of other malignant neoplasm of large intestine: Secondary | ICD-10-CM

## 2012-05-10 DIAGNOSIS — Z1211 Encounter for screening for malignant neoplasm of colon: Secondary | ICD-10-CM

## 2012-05-10 MED ORDER — SODIUM CHLORIDE 0.9 % IV SOLN: 500.0000 mL | INTRAVENOUS | Status: DC

## 2012-05-10 NOTE — Progress Notes (Signed)
OSTOMY BAG REAPPLIED BY NANCY HILL RN.

## 2012-05-10 NOTE — Op Note (Signed)
 [facility] [address] [place], [address]   COLONOSCOPY PROCEDURE REPORT  PATIENT: Jasmine Clayton, Jasmine Clayton  MR#: [id number] BIRTHDATE: [birth year] , 61  yrs. old GENDER: Female ENDOSCOPIST: Meryl Dare, MD, [facility]  PROCEDURE DATE:  05/10/2012 PROCEDURE:   Colonoscopy, diagnostic via colostomy ASA CLASS:   Class III INDICATIONS:follow up for previously diagnosed colon cancer. MEDICATIONS: MAC sedation, administered by CRNA and propofol (Diprivan) 350mg  IV DESCRIPTION OF PROCEDURE:   After the risks benefits and alternatives of the procedure were thoroughly explained, informed consent was obtained.     The colostomy appeared normal.   The LB [id number]  endoscope was introduced through the  an colostomy and advanced to the ascending colon.  No adverse events experienced.   Limited by patient discomfort, a tortuous colon, colonoscope looping-adhesions suspected.   The quality of the prep was excellent, using MoviPrep  The instrument was then slowly withdrawn as the colon was fully examined.   COLON FINDINGS: Intubation of the colostomy showed a 15-20 cm Y-shaped blind pouch with one end small bowel and the other end colon. A separate opening off of the Y near the colostomy led to the proximal colon. A normal appearing ascending, hepatic flexure, transverse were identified.  Looping led to a few mucosal abrasions. No polyps or cancers were seen.         .   .  The scope was withdrawn and the procedure completed.  COMPLICATIONS: There were no complications.  ENDOSCOPIC IMPRESSION: 1.  Normal proximal colon via colostomy 2.  Comlicated anatomy with a Y shaped blind pouch off colostomy  RECOMMENDATIONS: 1.  Repeat Colonoscopy, BE or VC in 2 years. 2.  Findings discussed with Dr. Dwain Sarna by phone today   eSigned:  Meryl Dare, MD, [facility] 05/10/2012 10:22 AM   cc: Emelia Loron, MD

## 2012-05-10 NOTE — Patient Instructions (Signed)
YOU HAD AN ENDOSCOPIC PROCEDURE TODAY AT THE [facility]: Refer to the procedure report that was given to you for any specific questions about what was found during the examination.  If the procedure report does not answer your questions, please call your gastroenterologist to clarify.  If you requested that your care partner not be given the details of your procedure findings, then the procedure report has been included in a sealed envelope for you to review at your convenience later.  YOU SHOULD EXPECT: Some feelings of bloating in the abdomen. Passage of more gas than usual.  Walking can help get rid of the air that was put into your GI tract during the procedure and reduce the bloating. If you had a lower endoscopy (such as a colonoscopy or flexible sigmoidoscopy) you may notice spotting of blood in your stool or on the toilet paper. If you underwent a bowel prep for your procedure, then you may not have a normal bowel movement for a few days.  DIET: Your first meal following the procedure should be a light meal and then it is ok to progress to your normal diet.  A half-sandwich or bowl of soup is an example of a good first meal.  Heavy or fried foods are harder to digest and may make you feel nauseous or bloated.  Likewise meals heavy in dairy and vegetables can cause extra gas to form and this can also increase the bloating.  Drink plenty of fluids but you should avoid alcoholic beverages for 24 hours.  ACTIVITY: Your care partner should take you home directly after the procedure.  You should plan to take it easy, moving slowly for the rest of the day.  You can resume normal activity the day after the procedure however you should NOT DRIVE or use heavy machinery for 24 hours (because of the sedation medicines used during the test).    SYMPTOMS TO REPORT IMMEDIATELY: A gastroenterologist can be reached at any hour.  During normal business hours, 8:30 AM to 5:00 PM Monday through Friday,  call [phone].  After hours and on weekends, please call the GI answering service at [phone] who will take a message and have the physician on call contact you.   Following lower endoscopy (colonoscopy or flexible sigmoidoscopy):  Excessive amounts of blood in the stool  Significant tenderness or worsening of abdominal pains  Swelling of the abdomen that is new, acute  Fever of 100F or higher  Black, tarry-looking stools  FOLLOW UP: If any biopsies were taken you will be contacted by phone or by letter within the next 1-3 weeks.  Call your gastroenterologist if you have not heard about the biopsies in 3 weeks.  Our staff will call the home number listed on your records the next business day following your procedure to check on you and address any questions or concerns that you may have at that time regarding the information given to you following your procedure. This is a courtesy call and so if there is no answer at the home number and we have not heard from you through the emergency physician on call, we will assume that you have returned to your regular daily activities without incident.  SIGNATURES/CONFIDENTIALITY: You and/or your care partner have signed paperwork which will be entered into your electronic medical record.  These signatures attest to the fact that that the information above on your After Visit Summary has been reviewed and is understood.  Full responsibility of   the confidentiality of this discharge information lies with you and/or your care-partner.  

## 2012-05-10 NOTE — Progress Notes (Signed)
Patient did not experience any of the following events: a burn prior to discharge; a fall within the facility; wrong site/side/patient/procedure/implant event; or a hospital transfer or hospital admission upon discharge from the facility. (G8907) Patient did not have preoperative order for IV antibiotic SSI prophylaxis. (G8918)  

## 2012-05-11 ENCOUNTER — Telehealth: Payer: Self-pay | Admitting: *Deleted

## 2012-05-11 NOTE — Telephone Encounter (Signed)
  Follow up Call-  Call back number 05/10/2012 07/26/2011 04/09/2011  Post procedure Call Back phone  # [phone]  Permission to leave phone message Yes - -     Patient questions:  Do you have a fever, pain , or abdominal swelling? no Pain Score  0 *  Have you tolerated food without any problems? yes  Have you been able to return to your normal activities? yes  Do you have any questions about your discharge instructions: Diet   no Medications  no Follow up visit  no  Do you have questions or concerns about your Care? no  Actions: * If pain score is 4 or above: No action needed, pain <4.

## 2012-05-25 ENCOUNTER — Other Ambulatory Visit (HOSPITAL_BASED_OUTPATIENT_CLINIC_OR_DEPARTMENT_OTHER): Admitting: Lab

## 2012-05-25 ENCOUNTER — Ambulatory Visit

## 2012-05-25 ENCOUNTER — Telehealth: Payer: Self-pay | Admitting: Oncology

## 2012-05-25 ENCOUNTER — Ambulatory Visit (HOSPITAL_BASED_OUTPATIENT_CLINIC_OR_DEPARTMENT_OTHER): Admitting: Oncology

## 2012-05-25 ENCOUNTER — Telehealth: Payer: Self-pay | Admitting: *Deleted

## 2012-05-25 VITALS — BP 124/73 | HR 83 | Temp 97.4°F | Resp 20 | Ht 60.0 in | Wt 241.0 lb

## 2012-05-25 DIAGNOSIS — D509 Iron deficiency anemia, unspecified: Secondary | ICD-10-CM

## 2012-05-25 DIAGNOSIS — C189 Malignant neoplasm of colon, unspecified: Secondary | ICD-10-CM

## 2012-05-25 DIAGNOSIS — C20 Malignant neoplasm of rectum: Secondary | ICD-10-CM

## 2012-05-25 DIAGNOSIS — Z809 Family history of malignant neoplasm, unspecified: Secondary | ICD-10-CM

## 2012-05-25 LAB — CBC WITH DIFFERENTIAL/PLATELET
BASO%: 0.4 % (ref 0.0–2.0)
Eosinophils Absolute: 0.1 10*3/uL (ref 0.0–0.5)
LYMPH%: 12.9 % — ABNORMAL LOW (ref 14.0–49.7)
MCHC: 33 g/dL (ref 31.5–36.0)
MONO#: 0.5 10*3/uL (ref 0.1–0.9)
NEUT#: 3.9 10*3/uL (ref 1.5–6.5)
Platelets: 178 10*3/uL (ref 145–400)
RBC: 4.09 10*6/uL (ref 3.70–5.45)
RDW: 16.6 % — ABNORMAL HIGH (ref 11.2–14.5)
WBC: 5.3 10*3/uL (ref 3.9–10.3)

## 2012-05-25 MED ORDER — SODIUM CHLORIDE 0.9 % IJ SOLN
10.0000 mL | INTRAMUSCULAR | Status: AC | PRN
  Administered 2012-05-25: 10 mL via INTRAVENOUS
  Filled 2012-05-25: qty 10

## 2012-05-25 MED ORDER — HEPARIN SOD (PORK) LOCK FLUSH 100 UNIT/ML IV SOLN
500.0000 [IU] | Freq: Once | INTRAVENOUS | Status: AC
  Administered 2012-05-25: 500 [IU] via INTRAVENOUS
  Filled 2012-05-25: qty 5

## 2012-05-25 NOTE — Telephone Encounter (Signed)
Per Dr. Truett Perna, patient is ready for colostomy takedown and PAC removal.  This RN notified Dr. Doreen Salvage office, spoke with [facility].  Appointment was given for patient to see Dr. Dwain Sarna and discuss plans for upcoming surgery.  Dr. Truett Perna informed of the surgical appointment.

## 2012-05-25 NOTE — Progress Notes (Signed)
   [facility]    OFFICE PROGRESS NOTE   INTERVAL HISTORY:   She returns as scheduled. No complaint. She had a colonoscopy 2 weeks ago. Rt. Knee pain.  Objective:  Vital signs in last 24 hours:  Blood pressure 124/73, pulse 83, temperature 97.4 F (36.3 C), temperature source Oral, resp. rate 20, height 5' (1.524 m), weight 241 lb (109.317 kg), last menstrual period 11/30/2009.    HEENT: neck without mass Lymphatics:no cervical, axillary,supraclav. Or inguinal nodes Resp: lungs clear Cardio: RRR GI: LLQ colostomy, no HSM, no mass Vascular: no edema  Portacath/PICC-without erythema  Lab Results:  Lab Results  Component Value Date   WBC 5.3 05/25/2012   HGB 12.2 05/25/2012   HCT 36.9 05/25/2012   MCV 90.2 05/25/2012   PLT 178 05/25/2012   anc 3.9  Xray: CT C/A/P 03/08/2012  - negative for metastatic disease Medications: I have reviewed the patient's current medications.  Assessment/Plan: 1. 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 She began adjuvant FOLFOX chemotherapy 07/26/2011. She completed cycle 6 on 10/07/2011. She began radiation and continuous infusion 5-fluorouracil on 10/25/2011, completed 12/01/2011. FOLFOX resumed for an additional 3 cycles beginning 12/16/2011. She completed the final cycle of chemotherapy 01/13/2012. Restaging CT's 03/08/2012-negative.Colonoscopy on 05/10/2012-negative. 2. 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 3. Open abdominal wound. The wound has healed. 4. History of a DVT while pregnant in 1979. 5. Normal preoperative CEA (1.0 on 05/14/2011). Normal CEA (0.6)on 01/13/2012. 6. Family history of cancer including endometrial cancer in her mother and colon cancer in her sister. The tumor was submitted for microsatellite instability testing. The tumor showed  no evidence of microsatellite instability by PCR. 7. Microcytic anemia. Ferritin was normal at 78 on 09/23/2011. Hemoglobin is normal. 8. History of thrombocytopenia secondary to chemotherapy. The platelet count is now in normal range. 9. Diarrhea secondary to radiation and chemotherapy. Resolved.       10. Right knee discomfort status post orthopedic evaluation.    Disposition: She is in clinical remission from colon cancer.  We will refer her to Dr. Dwain Sarna for portacath removal and colostomy takedown.She will return for an office visit and CEA in 6 months.  Thornton Papas, MD  05/25/2012  11:38 AM

## 2012-05-25 NOTE — Telephone Encounter (Signed)
appts made and printed for pt aom °

## 2012-05-26 LAB — CEA: CEA: 1.2 ng/mL (ref 0.0–5.0)

## 2012-06-01 ENCOUNTER — Telehealth: Payer: Self-pay | Admitting: *Deleted

## 2012-06-01 DIAGNOSIS — C189 Malignant neoplasm of colon, unspecified: Secondary | ICD-10-CM

## 2012-06-01 NOTE — Telephone Encounter (Signed)
Message copied by Sherre Poot on Thu Jun 01, 2012  4:21 PM ------      Message from: Wandalee Ferdinand      Created: Thu Jun 01, 2012  4:10 PM                   ----- Message -----         From: Ladene Artist, MD         Sent: 05/30/2012   8:00 PM           To: Wandalee Ferdinand, RN, Glori Luis, RN, #            Please call patient, cea is normal, check cea in 2 months, f/u as scheduled

## 2012-06-01 NOTE — Telephone Encounter (Signed)
Called patient with CEA result.  PAtient to come back in 2 months to re-check.  POF submitted for lab appointment in 2 months' time.

## 2012-06-02 ENCOUNTER — Telehealth: Payer: Self-pay | Admitting: *Deleted

## 2012-06-02 NOTE — Telephone Encounter (Signed)
Patient confirmed over the phone the new date and time of the lab only appointment on 07-24-2012 at 10:00am

## 2012-06-05 ENCOUNTER — Ambulatory Visit (INDEPENDENT_AMBULATORY_CARE_PROVIDER_SITE_OTHER): Admitting: General Surgery

## 2012-06-05 ENCOUNTER — Encounter (INDEPENDENT_AMBULATORY_CARE_PROVIDER_SITE_OTHER): Payer: Self-pay | Admitting: General Surgery

## 2012-06-05 VITALS — BP 162/84 | HR 76 | Temp 97.4°F | Resp 16 | Ht 60.0 in | Wt 244.2 lb

## 2012-06-05 DIAGNOSIS — C189 Malignant neoplasm of colon, unspecified: Secondary | ICD-10-CM

## 2012-06-05 NOTE — Progress Notes (Signed)
Patient ID: Jasmine Clayton, female   DOB: [birth year], 61 y.o.   MRN: [id number]  Chief Complaint  Patient presents with  . Other    Pt ready for ostomy recersal and PAC removal    HPI Jasmine Clayton is a 61 y.o. female.   HPI 46 yof with multiple medical problems who I know well from a difficult low anterior resection. I did a proximal diversion with loop ileostomy due to a leak on insufflation. This then necrosed in her abdominal wall and required reoperation with ileocecectomy and transverse colostomy. Since then she has been doing well. She has completed chemotherapy and xrt at this point. She really had no difficulty with this. Her stoma is working just fine. She has had some mucous and a small amount of stool out of her rectum. Her appetite is normal. She now has undergone complete evaluation and would like her stoma taken down.  She has good control of her anus.  She has no changes since our last visit.  Past Medical History  Diagnosis Date  . DVT (deep vein thrombosis) in pregnancy 1979    during pregnancy. lower extremity  . Stress incontinence, female   . Morbid obesity   . Degenerative joint disease   . Adenocarcinoma of colon 04/12/2011  . 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 History of chemotherapy      cycle 5 Folfox completed 09/23/11  . History of radiation therapy 10/25/11 -12/01/11    pelvis/presacral region    Past Surgical History  Procedure Date  . Total abdominal hysterectomy w/ bilateral salpingoophorectomy 11/30/09    BSO  . Total hip arthroplasty 2002    left hip  . Mass excision 2011    "mass removed from ovaries"  . Cesarean section   . Breast cyst excision   . Keloid excision 1982  . Joint replacement 2002    hip replacement  . Colon surgery     LAR, reoperation for necrotic ileostomy, transverse colostomy  . Colostomy 05/27/11  . Abdominal hysterectomy   . Portacath placement  07/26/11    right portacath    Family History  Problem Relation Age of Onset  . Cancer Mother 29    endometrial cancer  . Diabetes Father   . Colon cancer Sister 14  . Cancer Sister 30    colon cancer currently undergoing chemotherapy  . Stomach cancer Neg Hx     Social History History  Substance Use Topics  . Smoking status: Never Smoker   . Smokeless tobacco: Never Used  . Alcohol Use: 3.5 oz/week    7 drink(s) per week    No Known Allergies  Current Outpatient Prescriptions  Medication Sig Dispense Refill  . aspirin 81 MG tablet Take 81 mg by mouth daily.        . B Complex Vitamins (B-COMPLEX/B-12 PO) Take 1 tablet by mouth daily.        . calcium-vitamin D (OSCAL) 250-125 MG-UNIT per tablet Take 1 tablet by mouth daily.       . fish oil-omega-3 fatty acids 1000 MG capsule Take 1 g by mouth daily.        Marland Kitchen lidocaine-prilocaine (EMLA) cream Apply topically as needed. Apply to [facility] 2 hours prior to stick and cover with plastic wrap  30 g  prn  . meloxicam (MOBIC) 7.5 MG tablet Take 7.5 mg by mouth daily.      Marland Kitchen  tetrahydrozoline 0.05 % ophthalmic solution Place 1 drop into both eyes 2 (two) times daily as needed. For dry/irritated eyes (Visine)       No current facility-administered medications for this visit.   Facility-Administered Medications Ordered in Other Visits  Medication Dose Route Frequency Provider Last Rate Last Dose  . sodium chloride 0.9 % injection 10 mL  10 mL Intravenous PRN Ladene Artist, MD   10 mL at 05/25/12 1153    Review of Systems Review of Systems  Blood pressure 162/84, pulse 76, temperature 97.4 F (36.3 C), temperature source Temporal, resp. rate 16, height 5' (1.524 m), weight 244 lb 3.2 oz (110.768 kg), last menstrual period 11/30/2009.  Physical Exam Physical Exam  Vitals reviewed. Constitutional: She appears well-developed and well-nourished.  Cardiovascular: Normal rate, regular rhythm and normal heart sounds.     Pulmonary/Chest: Effort normal and breath sounds normal. She has no wheezes. She has no rales.  Abdominal: Soft. Bowel sounds are normal. There is no tenderness. A hernia is present. Hernia confirmed positive in the ventral area.      Data Reviewed Technique: Initial scout AP supine abdominal image was obtained.  Water soluble contrast was introduced into the colon in a  retrograde fashion and refluxed from the rectum to the cecum. Spot  images of the colon followed by overhead radiographs were obtained.  Fluoroscopy time: 1.24 minutes.  Comparison: Abdominal pelvic CT 04/14/2011.  Findings: The scout abdominal radiograph demonstrates a midline  ostomy. The bowel gas pattern is normal. There are degenerative  changes of the lumbar spine and a prior left total hip  arthroplasty.  Water-soluble contrast administered per rectum rapidly filled a  small caliber colon and entered the transverse colostomy bag. There  is no extravasation. There is no filling of the proximal colon or  small bowel.  After the patient voided, the distal colon was reevaluated  fluoroscopically with several spot images. No mucosal abnormality  is seen at the anastomosis. There is no extravasation.  IMPRESSION:  The distal colon appears unremarkable status post abdominal  perineal resection. The transverse colostomy is patent with  retrograde filling of the colostomy bag. No extravasation is  demonstrated.   Assessment    Colostomy    Plan    Colostomy takedown We discussed at length for colostomy takedown today. I think this is going to be very difficult. Will also plan on port removal at the same time. We have studied her from below as well as doing a limited colonoscopy of the proximal segment. Her CT scans also looked fine. We discussed a laparotomy with a colostomy take down. We discussed extensively the risks which include, but are not limited to, bleeding, infection, anastomotic leak requiring  reoperation or another ostomy, injury to surrounding structures requiring repair or another operation, hernia formation of which I think she will develop on as I will not be able to fix the one she has currently adequately, DVT, PE, stroke, cardiac complications. She understands all of these risks. I dont' think there is any way to make any of these better right now she very much wants her colostomy taken down.       Jasmine Clayton 06/05/2012, 2:54 PM

## 2012-07-03 ENCOUNTER — Encounter (HOSPITAL_COMMUNITY): Payer: Self-pay | Admitting: Pharmacy Technician

## 2012-07-03 NOTE — Patient Instructions (Addendum)
20 TASA SADD  07/03/2012   Your procedure is scheduled on: 07/11/12  Report to [facility] at [phone].  Call this number if you have problems the morning of surgery 336-: [phone]   Remember: complete the bowel prep   Do not eat food or drink liquids After Midnight.     Take these medicines the morning of surgery with A SIP OF WATER: no medications needed   Do not wear jewelry, make-up or nail polish.  Do not wear lotions, powders, or perfumes. You may wear deodorant.  Do not shave 48 hours prior to surgery. Men may shave face and neck.  Do not bring valuables to the hospital.  Contacts, dentures or bridgework may not be worn into surgery.  Leave suitcase in the car. After surgery it may be brought to your room.  For patients admitted to the hospital, checkout time is 11:00 AM the day of discharge.     Special Instructions: Shower using CHG 2 nights before surgery and the night before surgery.  If you shower the day of surgery use CHG.  Use special wash - you have one bottle of CHG for all showers.  You should use approximately 1/3 of the bottle for each shower.   Please read over the following fact sheets that you were given: MRSA Information, blood fact sheet  Birdie Sons, RN  pre op nurse call if needed [phone]

## 2012-07-03 NOTE — Progress Notes (Signed)
EKG 04/05/12 on EPIC

## 2012-07-04 DIAGNOSIS — H16009 Unspecified corneal ulcer, unspecified eye: Secondary | ICD-10-CM

## 2012-07-04 HISTORY — DX: Unspecified purulent endophthalmitis, unspecified eye: H16.009

## 2012-07-05 ENCOUNTER — Encounter (HOSPITAL_COMMUNITY)
Admission: RE | Admit: 2012-07-05 | Discharge: 2012-07-05 | Disposition: A | Discharge status: Home / Self Care | Source: Ambulatory Visit | Attending: General Surgery | Admitting: General Surgery

## 2012-07-05 ENCOUNTER — Encounter (HOSPITAL_COMMUNITY): Payer: Self-pay

## 2012-07-05 ENCOUNTER — Other Ambulatory Visit (HOSPITAL_COMMUNITY): Payer: Self-pay | Admitting: General Surgery

## 2012-07-05 HISTORY — DX: Unspecified purulent endophthalmitis, unspecified eye: H44.009

## 2012-07-05 HISTORY — DX: Unspecified corneal ulcer, unspecified eye: H16.009

## 2012-07-05 LAB — CBC WITH DIFFERENTIAL/PLATELET
Eosinophils Absolute: 0.1 10*3/uL (ref 0.0–0.7)
HCT: 38.5 % (ref 36.0–46.0)
Hemoglobin: 12.4 g/dL (ref 12.0–15.0)
Lymphs Abs: 0.9 10*3/uL (ref 0.7–4.0)
MCH: 28.7 pg (ref 26.0–34.0)
MCV: 89.1 fL (ref 78.0–100.0)
Monocytes Absolute: 0.5 10*3/uL (ref 0.1–1.0)
Monocytes Relative: 10 % (ref 3–12)
Neutrophils Relative %: 74 % (ref 43–77)
RBC: 4.32 MIL/uL (ref 3.87–5.11)

## 2012-07-05 LAB — COMPREHENSIVE METABOLIC PANEL
Alkaline Phosphatase: 125 U/L — ABNORMAL HIGH (ref 39–117)
BUN: 14 mg/dL (ref 6–23)
Creatinine, Ser: 0.99 mg/dL (ref 0.50–1.10)
GFR calc Af Amer: 70 mL/min — ABNORMAL LOW (ref >90)
Glucose, Bld: 93 mg/dL (ref 70–99)
Potassium: 3.7 mEq/L (ref 3.5–5.1)
Total Bilirubin: 0.4 mg/dL (ref 0.3–1.2)
Total Protein: 7.2 g/dL (ref 6.0–8.3)

## 2012-07-05 LAB — SURGICAL PCR SCREEN: MRSA, PCR: NEGATIVE

## 2012-07-05 NOTE — Progress Notes (Signed)
Pt informed to call surgeon if eye infection is not cleared by eye doctor in time for surgery

## 2012-07-11 ENCOUNTER — Ambulatory Visit (HOSPITAL_COMMUNITY): Admitting: Anesthesiology

## 2012-07-11 ENCOUNTER — Inpatient Hospital Stay (HOSPITAL_COMMUNITY)
Admission: RE | Admit: 2012-07-11 | Discharge: 2012-07-18 | DRG: 345 | Disposition: A | Discharge status: Home / Self Care | Source: Ambulatory Visit | Attending: General Surgery | Admitting: General Surgery

## 2012-07-11 ENCOUNTER — Encounter (HOSPITAL_COMMUNITY): Payer: Self-pay | Admitting: Anesthesiology

## 2012-07-11 ENCOUNTER — Encounter (HOSPITAL_COMMUNITY): Payer: Self-pay

## 2012-07-11 ENCOUNTER — Encounter (HOSPITAL_COMMUNITY)
Admission: RE | Disposition: A | Discharge status: Home / Self Care | Payer: Self-pay | Source: Ambulatory Visit | Attending: General Surgery

## 2012-07-11 DIAGNOSIS — Z433 Encounter for attention to colostomy: Secondary | ICD-10-CM

## 2012-07-11 DIAGNOSIS — K560 Paralytic ileus: Secondary | ICD-10-CM | POA: Diagnosis not present

## 2012-07-11 DIAGNOSIS — Z9221 Personal history of antineoplastic chemotherapy: Secondary | ICD-10-CM

## 2012-07-11 DIAGNOSIS — Z6841 Body Mass Index (BMI) 40.0 and over, adult: Secondary | ICD-10-CM

## 2012-07-11 DIAGNOSIS — Z923 Personal history of irradiation: Secondary | ICD-10-CM

## 2012-07-11 DIAGNOSIS — Z452 Encounter for adjustment and management of vascular access device: Secondary | ICD-10-CM

## 2012-07-11 DIAGNOSIS — Z7982 Long term (current) use of aspirin: Secondary | ICD-10-CM

## 2012-07-11 DIAGNOSIS — Z79899 Other long term (current) drug therapy: Secondary | ICD-10-CM

## 2012-07-11 DIAGNOSIS — Z86718 Personal history of other venous thrombosis and embolism: Secondary | ICD-10-CM

## 2012-07-11 DIAGNOSIS — K439 Ventral hernia without obstruction or gangrene: Secondary | ICD-10-CM | POA: Diagnosis present

## 2012-07-11 DIAGNOSIS — Z85038 Personal history of other malignant neoplasm of large intestine: Secondary | ICD-10-CM

## 2012-07-11 HISTORY — PX: PORT-A-CATH REMOVAL: SHX5289

## 2012-07-11 HISTORY — PX: COLOSTOMY TAKEDOWN: SHX5783

## 2012-07-11 LAB — TYPE AND SCREEN: Antibody Screen: NEGATIVE

## 2012-07-11 SURGERY — CLOSURE, COLOSTOMY
Anesthesia: General | Site: Chest | Wound class: Contaminated

## 2012-07-11 MED ORDER — DEXAMETHASONE SODIUM PHOSPHATE 10 MG/ML IJ SOLN
INTRAMUSCULAR | Status: DC | PRN
  Administered 2012-07-11: 10 mg via INTRAVENOUS

## 2012-07-11 MED ORDER — SUCCINYLCHOLINE CHLORIDE 20 MG/ML IJ SOLN
INTRAMUSCULAR | Status: DC | PRN
  Administered 2012-07-11: 100 mg via INTRAVENOUS

## 2012-07-11 MED ORDER — ACETAMINOPHEN 10 MG/ML IV SOLN
INTRAVENOUS | Status: DC | PRN
  Administered 2012-07-11: 1000 mg via INTRAVENOUS

## 2012-07-11 MED ORDER — LACTATED RINGERS IV SOLN
INTRAVENOUS | Status: DC
  Administered 2012-07-11: 12:00:00 via INTRAVENOUS
  Administered 2012-07-11: 1000 mL via INTRAVENOUS
  Administered 2012-07-11: 11:00:00 via INTRAVENOUS

## 2012-07-11 MED ORDER — LIDOCAINE HCL (CARDIAC) 20 MG/ML IV SOLN
INTRAVENOUS | Status: DC | PRN
  Administered 2012-07-11: 50 mg via INTRAVENOUS

## 2012-07-11 MED ORDER — PROPOFOL 10 MG/ML IV BOLUS
INTRAVENOUS | Status: DC | PRN
  Administered 2012-07-11: 200 mg via INTRAVENOUS

## 2012-07-11 MED ORDER — CEFAZOLIN SODIUM-DEXTROSE 2-3 GM-% IV SOLR
INTRAVENOUS | Status: AC
  Filled 2012-07-11: qty 50

## 2012-07-11 MED ORDER — ROCURONIUM BROMIDE 100 MG/10ML IV SOLN
INTRAVENOUS | Status: DC | PRN
  Administered 2012-07-11: 50 mg via INTRAVENOUS
  Administered 2012-07-11: 10 mg via INTRAVENOUS

## 2012-07-11 MED ORDER — ACETAMINOPHEN 10 MG/ML IV SOLN
INTRAVENOUS | Status: AC
  Filled 2012-07-11: qty 100

## 2012-07-11 MED ORDER — GATIFLOXACIN 0.5 % OP SOLN
1.0000 [drp] | Freq: Four times a day (QID) | OPHTHALMIC | Status: DC
  Administered 2012-07-11 – 2012-07-18 (×27): 1 [drp] via OPHTHALMIC
  Filled 2012-07-11: qty 2.5

## 2012-07-11 MED ORDER — NALOXONE HCL 0.4 MG/ML IJ SOLN: 0.4000 mg | INTRAMUSCULAR | Status: DC | PRN

## 2012-07-11 MED ORDER — ONDANSETRON HCL 4 MG/2ML IJ SOLN: 4.0000 mg | Freq: Four times a day (QID) | INTRAMUSCULAR | Status: DC | PRN

## 2012-07-11 MED ORDER — PHENYLEPHRINE HCL 10 MG/ML IJ SOLN
INTRAMUSCULAR | Status: DC | PRN
  Administered 2012-07-11: 80 ug via INTRAVENOUS
  Administered 2012-07-11: 40 ug via INTRAVENOUS
  Administered 2012-07-11: 80 ug via INTRAVENOUS

## 2012-07-11 MED ORDER — KETOROLAC TROMETHAMINE 30 MG/ML IJ SOLN: 15.0000 mg | Freq: Once | INTRAMUSCULAR | Status: DC | PRN

## 2012-07-11 MED ORDER — 0.9 % SODIUM CHLORIDE (POUR BTL) OPTIME
TOPICAL | Status: DC | PRN
  Administered 2012-07-11: 2000 mL

## 2012-07-11 MED ORDER — HYDROMORPHONE HCL PF 1 MG/ML IJ SOLN
INTRAMUSCULAR | Status: AC
  Filled 2012-07-11: qty 1

## 2012-07-11 MED ORDER — MIDAZOLAM HCL 5 MG/5ML IJ SOLN
INTRAMUSCULAR | Status: DC | PRN
  Administered 2012-07-11: 2 mg via INTRAVENOUS

## 2012-07-11 MED ORDER — FENTANYL CITRATE 0.05 MG/ML IJ SOLN
INTRAMUSCULAR | Status: DC | PRN
  Administered 2012-07-11: 100 ug via INTRAVENOUS
  Administered 2012-07-11: 50 ug via INTRAVENOUS

## 2012-07-11 MED ORDER — ATROPINE SULFATE 1 % OP SOLN: 1.0000 [drp] | OPHTHALMIC | Status: DC

## 2012-07-11 MED ORDER — ALVIMOPAN 12 MG PO CAPS
12.0000 mg | ORAL_CAPSULE | Freq: Once | ORAL | Status: AC
  Administered 2012-07-11: 12 mg via ORAL
  Filled 2012-07-11: qty 1

## 2012-07-11 MED ORDER — ATROPINE SULFATE 1 % OP SOLN
1.0000 [drp] | Freq: Every day | OPHTHALMIC | Status: DC
  Administered 2012-07-12 – 2012-07-17 (×6): 1 [drp] via OPHTHALMIC
  Filled 2012-07-11: qty 2

## 2012-07-11 MED ORDER — NAPHAZOLINE HCL 0.1 % OP SOLN: 1.0000 [drp] | Freq: Four times a day (QID) | OPHTHALMIC | Status: DC | PRN

## 2012-07-11 MED ORDER — DIPHENHYDRAMINE HCL 12.5 MG/5ML PO ELIX: 12.5000 mg | ORAL_SOLUTION | Freq: Four times a day (QID) | ORAL | Status: DC | PRN

## 2012-07-11 MED ORDER — ACETAMINOPHEN 650 MG RE SUPP: 650.0000 mg | Freq: Four times a day (QID) | RECTAL | Status: DC | PRN

## 2012-07-11 MED ORDER — METRONIDAZOLE IN NACL 5-0.79 MG/ML-% IV SOLN
INTRAVENOUS | Status: AC
  Filled 2012-07-11: qty 100

## 2012-07-11 MED ORDER — PROMETHAZINE HCL 25 MG/ML IJ SOLN: 6.2500 mg | INTRAMUSCULAR | Status: DC | PRN

## 2012-07-11 MED ORDER — HYDROMORPHONE HCL PF 1 MG/ML IJ SOLN
0.2500 mg | INTRAMUSCULAR | Status: DC | PRN
  Administered 2012-07-11 (×2): 0.5 mg via INTRAVENOUS

## 2012-07-11 MED ORDER — ACETAMINOPHEN 325 MG PO TABS: 650.0000 mg | ORAL_TABLET | Freq: Four times a day (QID) | ORAL | Status: DC | PRN

## 2012-07-11 MED ORDER — SODIUM CHLORIDE 0.9 % IJ SOLN: 9.0000 mL | INTRAMUSCULAR | Status: DC | PRN

## 2012-07-11 MED ORDER — MORPHINE SULFATE (PF) 1 MG/ML IV SOLN
INTRAVENOUS | Status: AC
  Filled 2012-07-11: qty 25

## 2012-07-11 MED ORDER — PANTOPRAZOLE SODIUM 40 MG IV SOLR
40.0000 mg | Freq: Every day | INTRAVENOUS | Status: DC
  Administered 2012-07-11 – 2012-07-17 (×7): 40 mg via INTRAVENOUS
  Filled 2012-07-11 (×8): qty 40

## 2012-07-11 MED ORDER — METRONIDAZOLE IN NACL 5-0.79 MG/ML-% IV SOLN
500.0000 mg | Freq: Once | INTRAVENOUS | Status: AC
  Administered 2012-07-11: 500 mg via INTRAVENOUS

## 2012-07-11 MED ORDER — NEOSTIGMINE METHYLSULFATE 1 MG/ML IJ SOLN
INTRAMUSCULAR | Status: DC | PRN
  Administered 2012-07-11: 5 mg via INTRAVENOUS

## 2012-07-11 MED ORDER — ONDANSETRON HCL 4 MG/2ML IJ SOLN
INTRAMUSCULAR | Status: DC | PRN
  Administered 2012-07-11: 4 mg via INTRAVENOUS

## 2012-07-11 MED ORDER — METRONIDAZOLE IN NACL 5-0.79 MG/ML-% IV SOLN
500.0000 mg | Freq: Three times a day (TID) | INTRAVENOUS | Status: AC
  Administered 2012-07-11 – 2012-07-12 (×3): 500 mg via INTRAVENOUS
  Filled 2012-07-11 (×3): qty 100

## 2012-07-11 MED ORDER — DIPHENHYDRAMINE HCL 50 MG/ML IJ SOLN: 12.5000 mg | Freq: Four times a day (QID) | INTRAMUSCULAR | Status: DC | PRN

## 2012-07-11 MED ORDER — HEPARIN SODIUM (PORCINE) 5000 UNIT/ML IJ SOLN
5000.0000 [IU] | Freq: Three times a day (TID) | INTRAMUSCULAR | Status: DC
  Administered 2012-07-12 – 2012-07-18 (×19): 5000 [IU] via SUBCUTANEOUS
  Filled 2012-07-11 (×22): qty 1

## 2012-07-11 MED ORDER — HEPARIN SODIUM (PORCINE) 5000 UNIT/ML IJ SOLN
5000.0000 [IU] | Freq: Once | INTRAMUSCULAR | Status: AC
  Administered 2012-07-11: 5000 [IU] via SUBCUTANEOUS
  Filled 2012-07-11: qty 1

## 2012-07-11 MED ORDER — SODIUM CHLORIDE 0.9 % IV SOLN
INTRAVENOUS | Status: DC
  Administered 2012-07-11 – 2012-07-12 (×2): via INTRAVENOUS

## 2012-07-11 MED ORDER — CEFAZOLIN SODIUM-DEXTROSE 2-3 GM-% IV SOLR
2.0000 g | Freq: Three times a day (TID) | INTRAVENOUS | Status: AC
  Administered 2012-07-11 – 2012-07-12 (×3): 2 g via INTRAVENOUS
  Filled 2012-07-11 (×3): qty 50

## 2012-07-11 MED ORDER — MORPHINE SULFATE (PF) 1 MG/ML IV SOLN
INTRAVENOUS | Status: DC
  Administered 2012-07-11: 14:00:00 via INTRAVENOUS
  Administered 2012-07-11: 9 mg via INTRAVENOUS
  Administered 2012-07-11: 3 mg via INTRAVENOUS
  Administered 2012-07-12: 11:00:00 via INTRAVENOUS
  Administered 2012-07-12: 1.5 mg via INTRAVENOUS
  Administered 2012-07-12: 5.4 mg via INTRAVENOUS
  Administered 2012-07-12: 1.5 mg via INTRAVENOUS
  Administered 2012-07-12: 10.5 mg via INTRAVENOUS
  Administered 2012-07-13: 3 mg via INTRAVENOUS
  Administered 2012-07-13: 1.5 mg via INTRAVENOUS
  Administered 2012-07-14: 3 mg via INTRAVENOUS
  Filled 2012-07-11: qty 25

## 2012-07-11 MED ORDER — CEFAZOLIN SODIUM-DEXTROSE 2-3 GM-% IV SOLR
2.0000 g | INTRAVENOUS | Status: AC
  Administered 2012-07-11: 2 g via INTRAVENOUS

## 2012-07-11 MED ORDER — GLYCOPYRROLATE 0.2 MG/ML IJ SOLN
INTRAMUSCULAR | Status: DC | PRN
  Administered 2012-07-11: 0.6 mg via INTRAVENOUS

## 2012-07-11 SURGICAL SUPPLY — 43 items
BANDAGE GAUZE ELAST BULKY 4 IN (GAUZE/BANDAGES/DRESSINGS) ×3 IMPLANT
BLADE HEX COATED 2.75 (ELECTRODE) ×3 IMPLANT
BLADE SURG 15 STRL LF DISP TIS (BLADE) ×2 IMPLANT
BLADE SURG 15 STRL SS (BLADE) ×1
CANISTER SUCTION 2500CC (MISCELLANEOUS) ×3 IMPLANT
CLOTH BEACON ORANGE TIMEOUT ST (SAFETY) ×3 IMPLANT
DECANTER SPIKE VIAL GLASS SM (MISCELLANEOUS) ×3 IMPLANT
DERMABOND ADVANCED (GAUZE/BANDAGES/DRESSINGS) ×1
DERMABOND ADVANCED .7 DNX12 (GAUZE/BANDAGES/DRESSINGS) ×2 IMPLANT
DRAPE LAPAROTOMY TRNSV 102X78 (DRAPE) ×3 IMPLANT
DRSG PAD ABDOMINAL 8X10 ST (GAUZE/BANDAGES/DRESSINGS) ×3 IMPLANT
ELECT REM PT RETURN 9FT ADLT (ELECTROSURGICAL) ×3
ELECTRODE REM PT RTRN 9FT ADLT (ELECTROSURGICAL) ×2 IMPLANT
GLOVE BIO SURGEON STRL SZ7 (GLOVE) ×6 IMPLANT
GLOVE BIOGEL PI IND STRL 7.0 (GLOVE) ×2 IMPLANT
GLOVE BIOGEL PI IND STRL 7.5 (GLOVE) ×4 IMPLANT
GLOVE BIOGEL PI INDICATOR 7.0 (GLOVE) ×1
GLOVE BIOGEL PI INDICATOR 7.5 (GLOVE) ×2
GOWN PREVENTION PLUS LG XLONG (DISPOSABLE) ×3 IMPLANT
GOWN PREVENTION PLUS XLARGE (GOWN DISPOSABLE) ×3 IMPLANT
GOWN STRL NON-REIN LRG LVL3 (GOWN DISPOSABLE) ×3 IMPLANT
GOWN STRL REIN XL XLG (GOWN DISPOSABLE) ×3 IMPLANT
KIT BASIN OR (CUSTOM PROCEDURE TRAY) ×3 IMPLANT
NEEDLE HYPO 22GX1.5 SAFETY (NEEDLE) IMPLANT
NEEDLE HYPO 25X1 1.5 SAFETY (NEEDLE) ×3 IMPLANT
PACK BASIC VI WITH GOWN DISP (CUSTOM PROCEDURE TRAY) ×3 IMPLANT
PENCIL BUTTON HOLSTER BLD 10FT (ELECTRODE) ×3 IMPLANT
RELOAD PROXIMATE 75MM BLUE (ENDOMECHANICALS) ×3 IMPLANT
SPONGE GAUZE 4X4 12PLY (GAUZE/BANDAGES/DRESSINGS) ×3 IMPLANT
SPONGE LAP 18X18 X RAY DECT (DISPOSABLE) ×6 IMPLANT
SPONGE LAP 4X18 X RAY DECT (DISPOSABLE) ×3 IMPLANT
STAPLER GUN LINEAR PROX 60 (STAPLE) ×3 IMPLANT
STAPLER PROXIMATE 75MM BLUE (STAPLE) ×6 IMPLANT
SUT MNCRL AB 4-0 PS2 18 (SUTURE) ×3 IMPLANT
SUT PROLENE 0 CT 1 CR/8 (SUTURE) ×6 IMPLANT
SUT SILK 2 0 (SUTURE) ×1
SUT SILK 2-0 18XBRD TIE 12 (SUTURE) ×2 IMPLANT
SUT VIC AB 3-0 SH 27 (SUTURE)
SUT VIC AB 3-0 SH 27XBRD (SUTURE) IMPLANT
SYR BULB IRRIGATION 50ML (SYRINGE) IMPLANT
SYR CONTROL 10ML LL (SYRINGE) ×3 IMPLANT
TOWEL OR 17X26 10 PK STRL BLUE (TOWEL DISPOSABLE) ×3 IMPLANT
YANKAUER SUCT BULB TIP 10FT TU (MISCELLANEOUS) IMPLANT

## 2012-07-11 NOTE — Anesthesia Postprocedure Evaluation (Signed)
  Anesthesia Post-op Note  Patient: Jasmine Clayton  Procedure(s) Performed: Procedure(s) (LRB): COLOSTOMY TAKEDOWN (N/A) REMOVAL PORT-A-CATH (N/A)  Patient Location: PACU  Anesthesia Type: General  Level of Consciousness: awake and alert   Airway and Oxygen Therapy: Patient Spontanous Breathing  Post-op Pain: mild  Post-op Assessment: Post-op Vital signs reviewed, Patient's Cardiovascular Status Stable, Respiratory Function Stable, Patent Airway and No signs of Nausea or vomiting  Last Vitals:  Filed Vitals:   07/11/12 1430  BP: 146/72  Pulse: 66  Temp: 36.5 C  Resp: 13    Post-op Vital Signs: stable   Complications: No apparent anesthesia complications

## 2012-07-11 NOTE — H&P (Signed)
HPI  69 yof with multiple medical problems who I know well from a difficult low anterior resection. I did a proximal diversion with loop ileostomy due to a leak on insufflation. This then necrosed in her abdominal wall and required reoperation with ileocecectomy and transverse colostomy. Since then she has been doing well. She has completed chemotherapy and xrt at this point. She really had no difficulty with this. Her stoma is working just fine. She has had some mucous and a small amount of stool out of her rectum. Her appetite is normal. She now has undergone complete evaluation and would like her stoma taken down. She has good control of her anus. She reports no new changes.   Past Medical History   Diagnosis  Date   .  DVT (deep vein thrombosis) in pregnancy  1979     during pregnancy. lower extremity   .  Stress incontinence, female    .  Morbid obesity    .  Degenerative joint disease    .  Adenocarcinoma of colon  04/12/2011   .  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  History of chemotherapy      cycle 5 Folfox completed 09/23/11   .  History of radiation therapy  10/25/11 -12/01/11     pelvis/presacral region    Past Surgical History   Procedure  Date   .  Total abdominal hysterectomy w/ bilateral salpingoophorectomy  11/30/09     BSO   .  Total hip arthroplasty  2002     left hip   .  Mass excision  2011     "mass removed from ovaries"   .  Cesarean section    .  Breast cyst excision    .  Keloid excision  1982   .  Joint replacement  2002     hip replacement   .  Colon surgery      LAR, reoperation for necrotic ileostomy, transverse colostomy   .  Colostomy  05/27/11   .  Abdominal hysterectomy    .  Portacath placement  07/26/11     right portacath    Family History   Problem  Relation  Age of Onset   .  Cancer  Mother  54      endometrial cancer    .  Diabetes  Father    .  Colon cancer  Sister  7    .  Cancer  Sister  53      colon cancer currently undergoing chemotherapy    .  Stomach cancer  Neg Hx     Social History  History   Substance Use Topics   .  Smoking status:  Never Smoker   .  Smokeless tobacco:  Never Used   .  Alcohol Use:  3.5 oz/week     7 drink(s) per week    No Known Allergies  Current Outpatient Prescriptions   Medication  Sig  Dispense  Refill   .  aspirin 81 MG tablet  Take 81 mg by mouth daily.     .  B Complex Vitamins (B-COMPLEX/B-12 PO)  Take 1 tablet by mouth daily.     .  calcium-vitamin D (OSCAL) 250-125 MG-UNIT per tablet  Take 1 tablet by mouth daily.     .  fish oil-omega-3 fatty acids 1000 MG capsule  Take 1 g by mouth daily.     Marland Kitchen  lidocaine-prilocaine (EMLA) cream  Apply topically as needed. Apply to [facility] 2 hours prior to stick and cover with plastic wrap  30 g  prn   .  meloxicam (MOBIC) 7.5 MG tablet  Take 7.5 mg by mouth daily.     Marland Kitchen  tetrahydrozoline 0.05 % ophthalmic solution  Place 1 drop into both eyes 2 (two) times daily as needed. For dry/irritated eyes (Visine)      No current facility-administered medications for this visit.    Facility-Administered Medications Ordered in Other Visits   Medication  Dose  Route  Frequency  Provider  Last Rate  Last Dose   .  sodium chloride 0.9 % injection 10 mL  10 mL  Intravenous  PRN  Ladene Artist, MD   10 mL at 05/25/12 1153    Review of Systems  Review of Systems   Pulm : no wheezing CV: no chest pain  Blood pressure 162/84, pulse 76, temperature 97.4 F (36.3 C), temperature source Temporal, resp. rate 16, height 5' (1.524 m), weight 244 lb 3.2 oz (110.768 kg), last menstrual period 11/30/2009.  Physical Exam  Physical Exam  Vitals reviewed.  Constitutional: She appears well-developed and well-nourished.  Cardiovascular: Normal rate, regular rhythm and normal heart sounds.  Pulmonary/Chest: Effort normal and breath sounds normal. She has no wheezes. She has no rales.  Abdominal:  Soft. Bowel sounds are normal. There is no tenderness. A hernia is present. Hernia confirmed positive in the ventral area.    Data Reviewed  Technique: Initial scout AP supine abdominal image was obtained.  Water soluble contrast was introduced into the colon in a  retrograde fashion and refluxed from the rectum to the cecum. Spot  images of the colon followed by overhead radiographs were obtained.  Fluoroscopy time: 1.24 minutes.  Comparison: Abdominal pelvic CT 04/14/2011.  Findings: The scout abdominal radiograph demonstrates a midline  ostomy. The bowel gas pattern is normal. There are degenerative  changes of the lumbar spine and a prior left total hip  arthroplasty.  Water-soluble contrast administered per rectum rapidly filled a  small caliber colon and entered the transverse colostomy bag. There  is no extravasation. There is no filling of the proximal colon or  small bowel.  After the patient voided, the distal colon was reevaluated  fluoroscopically with several spot images. No mucosal abnormality  is seen at the anastomosis. There is no extravasation.  IMPRESSION:  The distal colon appears unremarkable status post abdominal  perineal resection. The transverse colostomy is patent with  retrograde filling of the colostomy bag. No extravasation is  demonstrated.  Assessment   Colostomy   Plan   Colostomy takedown  We discussed at length for colostomy takedown today. I think this is going to be very difficult. Will also plan on port removal at the same time. We have studied her from below as well as doing a limited colonoscopy of the proximal segment. Her CT scans also looked fine. We discussed a laparotomy with a colostomy take down. We discussed extensively the risks which include, but are not limited to, bleeding, infection, anastomotic leak requiring reoperation or another ostomy, injury to surrounding structures requiring repair or another operation, hernia formation of  which I think she will develop on as I will not be able to fix the one she has currently adequately, DVT, PE, stroke, cardiac complications. She understands all of these risks.

## 2012-07-11 NOTE — Transfer of Care (Signed)
Immediate Anesthesia Transfer of Care Note  Patient: Jasmine Clayton  Procedure(s) Performed: Procedure(s) (LRB) with comments: COLOSTOMY TAKEDOWN (N/A) - colostomy takedown REMOVAL PORT-A-CATH (N/A) - removal portacath  Patient Location: PACU  Anesthesia Type:General  Level of Consciousness: sedated  Airway & Oxygen Therapy: Patient Spontanous Breathing and Patient connected to face mask oxygen  Post-op Assessment: Report given to PACU RN and Post -op Vital signs reviewed and stable  Post vital signs: Reviewed and stable  Complications: No apparent anesthesia complications

## 2012-07-11 NOTE — Interval H&P Note (Signed)
History and Physical Interval Note:  07/11/2012 10:08 AM  Jasmine Clayton  has presented today for surgery, with the diagnosis of colostomy, un-needed venous access  The various methods of treatment have been discussed with the patient and family. After consideration of risks, benefits and other options for treatment, the patient has consented to  Procedure(s) (LRB) with comments: COLOSTOMY TAKEDOWN (N/A) - colostomy takedown REMOVAL PORT-A-CATH (N/A) - removal portacath as a surgical intervention .  The patient's history has been reviewed, patient examined, no change in status, stable for surgery.  I have reviewed the patient's chart and labs.  Questions were answered to the patient's satisfaction.     [name]

## 2012-07-11 NOTE — Op Note (Signed)
Preoperative diagnosis: Status post transverse colostomy after low anterior resection for rectal cancer, Port-A-Cath no longer needs venous access Postoperative diagnosis: Same as above Procedure: #1 right internal jugular port removal #2 colostomy takedown Surgeon: Dr. Harden Mo Anesthesia: Gen. Estimated blood loss: 50 cc Drains: None Complications: None Specimens: Colostomy Sponge and needle count correct at end of operation Disposition to recovery in stable condition  Indications: This is a 61 year old morbidly obese female who I know from a low anterior resection for cancer. I gave her an ileostomy at that time due to a leak on insufflation. The ileostomy became necrotic and I returned her to the operating room and did a transverse loop colostomy. Subsequent to this she is undergoing chemotherapy and has no evidence of any recurrent disease at this point in time. I did a rectal exam confirming normal tone, colonoscopy confirming no real lesions, barium enema that showed no leak and the anastomosis was patent by her to surgery. We discussed a colostomy takedown. I told her that she also has a hernia and I would not be able to repair the hernia at the same time as this. We discussed at length the risks and benefits of surgery.  Procedure: After informed consent was obtained the patient was taken to the operating room. She underwent bowel preparation at home. She was administered Ancef and Flagyl. Sequential compression devices were placed on her legs. She was also administered subcutaneous heparin. She was placed under general anesthesia without complication. A nasogastric tube and Foley catheter were placed. We first prepped her chest. Surgical timeout was performed.  I reentered her incision where her port is placed. I then identified her port and removed it in its entirety. Hemostasis was observed. I closed this with 3-0 Vicryl and 4-0 Monocryl. Dermabond was placed over this.  We then  reprepped and draped her abdomen. I did sew her ostomy shut with a 2-0 silk suture. Another surgical timeout was performed. I made an incision above her colostomy and entered into her subcutaneous tissues. I made an incision around her colostomy including a small portion of the skin I went below this as well with some difficulty eventually I was able to dissect the colostomy free from the surrounding structures. She had a lot of scar tissue as she had before as well. Eventually I was able to identify both limbs of the colon. After some time I was able to identify the fascia on both sides. This was retracted and there was a fairly large hernia defect. I spent about an hour and a half lysing adhesions just to get the colon free enough to be able to put this together. I did make a small serosal tear in the proximal portion. Once I did this I thought about doing a sutured anastomosis or to do a stapled anastomosis so I could include the serosal tear in it. These were limbs were next to each other under no tension at all. I then attached them with 2-0 vicryl suture to each other. Once I had done this I created a enterotomy in both. I then placed the GIA stapler in either end and created an anastomosis. This was hemostatic. This looked good on both sides. On the back side of this I did oversew this with a 3-0 silk to ensure that I had the serosal tear was included. This looked good upon completion. I then closed the common enterotomy with a TA stapler. I oversewed this with a 3-0 silk. I had two 2-0 silk  crotch stitches placed. There was no real mesenteric defect to close at this point. There was so much scarring in her abdomen was difficult to identify any other structures in her small bowel was well away from this. I then created some flaps of the fascia. Several options were open to try to close her abdomen. There was no way to close this primarily. I was not going to place mesh also given the fact that just on a colon  operation. I debated placing biologic mesh overlying this but in the end after I did this I was able to close basically her hernia sac to cover the viscera. She will certainly have a hernia I told her preoperatively but this will hopefully will heal. I then closed the subcutaneous tissue and placed a wet-to-dry dressing. Dressing was then placed over this. She tolerated as well as activity and trimmed to recovery stable.

## 2012-07-11 NOTE — Anesthesia Preprocedure Evaluation (Addendum)
Anesthesia Evaluation  Patient identified by MRN, date of birth, ID band Patient awake  General Assessment Comment:H/O colon ca  Reviewed: Allergy & Precautions, H&P , NPO status , Patient's Chart, lab work & pertinent test results  Airway Mallampati: III TM Distance: <3 FB Neck ROM: Full    Dental No notable dental hx. (+) Dental Advisory Given   Pulmonary neg pulmonary ROS,  breath sounds clear to auscultation  + decreased breath sounds      Cardiovascular negative cardio ROS  Rhythm:Regular Rate:Normal     Neuro/Psych negative neurological ROS  negative psych ROS   GI/Hepatic negative GI ROS, Neg liver ROS,   Endo/Other  Morbid obesity  Renal/GU negative Renal ROS  negative genitourinary   Musculoskeletal negative musculoskeletal ROS (+)   Abdominal   Peds negative pediatric ROS (+)  Hematology negative hematology ROS (+)   Anesthesia Other Findings   Reproductive/Obstetrics negative OB ROS                           Anesthesia Physical Anesthesia Plan  ASA: II  Anesthesia Plan: General   Post-op Pain Management:    Induction: Intravenous  Airway Management Planned: Oral ETT  Additional Equipment:   Intra-op Plan:   Post-operative Plan: Extubation in OR  Informed Consent: I have reviewed the patients History and Physical, chart, labs and discussed the procedure including the risks, benefits and alternatives for the proposed anesthesia with the patient or authorized representative who has indicated his/her understanding and acceptance.   Dental advisory given  Plan Discussed with: CRNA and Surgeon  Anesthesia Plan Comments:         Anesthesia Quick Evaluation

## 2012-07-12 ENCOUNTER — Encounter (HOSPITAL_COMMUNITY): Payer: Self-pay | Admitting: General Surgery

## 2012-07-12 LAB — CBC
HCT: 35.3 % — ABNORMAL LOW (ref 36.0–46.0)
Hemoglobin: 11.5 g/dL — ABNORMAL LOW (ref 12.0–15.0)
MCH: 28.7 pg (ref 26.0–34.0)
MCHC: 32.6 g/dL (ref 30.0–36.0)
MCV: 88 fL (ref 78.0–100.0)

## 2012-07-12 LAB — BASIC METABOLIC PANEL
BUN: 12 mg/dL (ref 6–23)
Calcium: 9 mg/dL (ref 8.4–10.5)
GFR calc non Af Amer: 52 mL/min — ABNORMAL LOW (ref >90)
Glucose, Bld: 118 mg/dL — ABNORMAL HIGH (ref 70–99)
Potassium: 3.8 mEq/L (ref 3.5–5.1)

## 2012-07-12 MED ORDER — KCL IN DEXTROSE-NACL 20-5-0.45 MEQ/L-%-% IV SOLN
INTRAVENOUS | Status: DC
  Administered 2012-07-12: 11:00:00 via INTRAVENOUS
  Administered 2012-07-12: 100 mL/h via INTRAVENOUS
  Administered 2012-07-13 – 2012-07-16 (×6): via INTRAVENOUS
  Administered 2012-07-18: 20 mL/h via INTRAVENOUS
  Filled 2012-07-12 (×12): qty 1000

## 2012-07-12 NOTE — Progress Notes (Signed)
1 Day Post-Op  Subjective: No complaints, no flatus, pain controlled, discussed results of surgery, she understands she still has hernia  Objective: Vital signs in last 24 hours: Temp:  [97.6 F (36.4 C)-98.3 F (36.8 C)] 98.3 F (36.8 C) (12/11 0549) Pulse Rate:  [65-77] 66  (12/11 0549) Resp:  [12-17] 14  (12/11 0836) BP: (108-152)/(61-75) 116/61 mmHg (12/11 0549) SpO2:  [40 %-100 %] 100 % (12/11 0836) FiO2 (%):  [38 %-40 %] 38 % (12/11 0414) Weight:  [244 lb 0.7 oz (110.698 kg)] 244 lb 0.7 oz (110.698 kg) (12/10 1505)    Intake/Output from previous day: 12/10 0701 - 12/11 0700 In: 4030 [I.V.:3980; IV Piggyback:50] Out: 1190 [Urine:990; Emesis/NG output:200] Intake/Output this shift:    General appearance: no distress Resp: diminished breath sounds bibasilar Cardio: regular rate and rhythm GI: incision packed, approp tender, no bs  Lab Results:   Basename 07/12/12 0430  WBC 13.2*  HGB 11.5*  HCT 35.3*  PLT 195   BMET  Basename 07/12/12 0430  NA 139  K 3.8  CL 105  CO2 26  GLUCOSE 118*  BUN 12  CREATININE 1.13*  CALCIUM 9.0    Assessment/Plan: POD 1 port removal, colostomy takedown 1. Neuro-continue pca until tol po 2. CV/Pulm- pulm toilet, home meds 3. GI- await ileus resolve, will leave ng for now 4. Renal- dc foley, adequate uop, lytes nl, cont ivf 5. Sq heparin, scds 6. Bid dressing changes 7. 24 hours abx off today  [facility] 07/12/2012

## 2012-07-12 NOTE — Care Management Note (Addendum)
    Page 1 of 1   07/17/2012     1:54:25 PM   CARE MANAGEMENT NOTE 07/17/2012  Patient:  Jasmine Clayton, Jasmine Clayton   Account Number:  [id number]  Date Initiated:  07/12/2012  Documentation initiated by:  Lorenda Ishihara  Subjective/Objective Assessment:   61 yo female admitted s/p colostomy takedown, right internal jugular port removal. PTA lived at home with spouse.     Action/Plan:   Home when stable   Anticipated DC Date:  07/18/2012   Anticipated DC Plan:  HOME W HOME HEALTH SERVICES      DC Planning Services  CM consult      [facility] Choice  HOME HEALTH   Choice offered to / List presented to:  C-1 Patient        HH arranged  HH-1 RN      [facility] agency  [facility]   Status of service:  Completed, signed off Medicare Important Message given?   (If response is "NO", the following Medicare IM given date fields will be blank) Date Medicare IM given:   Date Additional Medicare IM given:    Discharge Disposition:  HOME W HOME HEALTH SERVICES  Per UR Regulation:  Reviewed for med. necessity/level of care/duration of stay  If discussed at Long Length of Stay Meetings, dates discussed:    Comments:  07-17-12 Lorenda Ishihara RN CM 1450 Spoke with patient at bedside. Will need assist with wound care at d/c. Patient has used [facility] in the past and would like to use them again. Contacted Darl Pikes with [facility] to arrange. Awaiting final orders, Darl Pikes will f/u with patient.

## 2012-07-13 ENCOUNTER — Telehealth (INDEPENDENT_AMBULATORY_CARE_PROVIDER_SITE_OTHER): Payer: Self-pay | Admitting: General Surgery

## 2012-07-13 LAB — BASIC METABOLIC PANEL
BUN: 10 mg/dL (ref 6–23)
Chloride: 105 mEq/L (ref 96–112)
Creatinine, Ser: 1 mg/dL (ref 0.50–1.10)
GFR calc Af Amer: 70 mL/min — ABNORMAL LOW (ref >90)

## 2012-07-13 MED ORDER — POTASSIUM CHLORIDE 10 MEQ/100ML IV SOLN
10.0000 meq | INTRAVENOUS | Status: AC
  Administered 2012-07-13 (×3): 10 meq via INTRAVENOUS
  Filled 2012-07-13 (×4): qty 100

## 2012-07-13 MED ORDER — PHENOL 1.4 % MT LIQD
1.0000 | OROMUCOSAL | Status: DC | PRN
  Administered 2012-07-13: 1 via OROMUCOSAL
  Filled 2012-07-13: qty 177

## 2012-07-13 MED ORDER — POTASSIUM CHLORIDE 10 MEQ/100ML IV SOLN
10.0000 meq | Freq: Once | INTRAVENOUS | Status: AC
  Administered 2012-07-13: 10 meq via INTRAVENOUS

## 2012-07-13 MED ORDER — SODIUM CHLORIDE 0.9 % IV BOLUS (SEPSIS)
500.0000 mL | Freq: Once | INTRAVENOUS | Status: AC
  Administered 2012-07-13: 500 mL via INTRAVENOUS

## 2012-07-13 MED ORDER — BIOTENE DRY MOUTH MT LIQD
15.0000 mL | Freq: Two times a day (BID) | OROMUCOSAL | Status: DC
  Administered 2012-07-13 – 2012-07-17 (×9): 15 mL via OROMUCOSAL

## 2012-07-13 MED ORDER — CHLORHEXIDINE GLUCONATE 0.12 % MT SOLN
15.0000 mL | Freq: Two times a day (BID) | OROMUCOSAL | Status: DC
  Administered 2012-07-13 – 2012-07-17 (×10): 15 mL via OROMUCOSAL
  Filled 2012-07-13 (×14): qty 15

## 2012-07-13 NOTE — Telephone Encounter (Signed)
Pt's daughter called in and explained that Dr. Dwain Sarna wanted to know who the patient eye MD was.  She explained it was Dr. Junie Bame at [phone].

## 2012-07-13 NOTE — Progress Notes (Signed)
NGt returned to [facility], Dr. Dwain Sarna unavailable for page at this time. Will report output of approx 20ml to night RN

## 2012-07-13 NOTE — Evaluation (Signed)
Physical Therapy Evaluation Patient Details Name: Jasmine Clayton MRN: [id number] DOB: [birth year] Today's Date: 07/13/2012 Time: 4098-1191 PT Time Calculation (min): 22 min  PT Assessment / Plan / Recommendation Clinical Impression  61 yo female s/p R internal jugular port removal, colostomy takedown. Mobilizing slowly but fairly well. Used RW for stability while ambulating. Anticipate pt will progress well during stay. Will follow.    PT Assessment  Patient needs continued PT services    Follow Up Recommendations  No PT follow up    Does the patient have the potential to tolerate intense rehabilitation      Barriers to Discharge        Equipment Recommendations  None recommended by PT    Recommendations for Other Services OT consult   Frequency Min 3X/week    Precautions / Restrictions Precautions Precautions: Fall Precaution Comments: NG tube, multiple lines/leads, O2 Restrictions Weight Bearing Restrictions: No   Pertinent Vitals/Pain IV site, throat-unrated      Mobility  Bed Mobility Bed Mobility: Not assessed Details for Bed Mobility Assistance: Pt sitting in recliner Transfers Transfers: Sit to Stand;Stand to Sit Sit to Stand: 4: Min assist;From chair/3-in-1;From toilet;With upper extremity assist Stand to Sit: 4: Min assist;To chair/3-in-1;To toilet;With armrests;With upper extremity assist Details for Transfer Assistance: VCS safety, hand placement. Assist to stabilize, control descent.  Ambulation/Gait Ambulation/Gait Assistance: 4: Min assist Ambulation Distance (Feet): 160 Feet Assistive device: Rolling walker Ambulation/Gait Assistance Details: VCS safety, distance from RW. Assist to stabilize throughout ambulation. Ambulated on 2L O2.  Gait Pattern: Step-through pattern;Decreased stride length    Shoulder Instructions     Exercises     PT Diagnosis: Difficulty walking;Generalized weakness;Acute pain  PT Problem List: Decreased  strength;Decreased activity tolerance;Decreased mobility;Pain;Decreased knowledge of use of DME PT Treatment Interventions: DME instruction;Gait training;Functional mobility training;Therapeutic activities;Therapeutic exercise;Stair training;Patient/family education   PT Goals Acute Rehab PT Goals PT Goal Formulation: With patient Time For Goal Achievement: 07/27/12 Potential to Achieve Goals: Good Pt will go Supine/Side to Sit: with supervision PT Goal: Supine/Side to Sit - Progress: Goal set today Pt will go Sit to Supine/Side: with supervision PT Goal: Sit to Supine/Side - Progress: Goal set today Pt will go Sit to Stand: with supervision PT Goal: Sit to Stand - Progress: Goal set today Pt will Ambulate: >150 feet;with supervision;with least restrictive assistive device PT Goal: Ambulate - Progress: Goal set today Pt will Go Up / Down Stairs: 6-9 stairs;with supervision;with rail(s) PT Goal: Up/Down Stairs - Progress: Goal set today  Visit Information  Last PT Received On: 07/13/12 Assistance Needed: +1    Subjective Data  Subjective: "My IV and throat hurt the worst." Patient Stated Goal: Home once better   Prior Functioning  Home Living Lives With: Spouse Available Help at Discharge: Family Type of Home: House Home Access: Stairs to enter Secretary/administrator of Steps: 10 Entrance Stairs-Rails: Right;Left Home Layout: One level Home Adaptive Equipment: Walker - rolling Prior Function Level of Independence: Independent Able to Take Stairs?: Yes Communication Communication: No difficulties    Cognition  Overall Cognitive Status: Appears within functional limits for tasks assessed/performed Arousal/Alertness: Awake/alert Orientation Level: Appears intact for tasks assessed Behavior During Session: [facility] for tasks performed    Extremity/Trunk Assessment Right Lower Extremity Assessment RLE ROM/Strength/Tone: Deficits RLE ROM/Strength/Tone Deficits: Strength at  least 4/5 with functional activity Left Lower Extremity Assessment LLE ROM/Strength/Tone: Deficits LLE ROM/Strength/Tone Deficits: Strength at least 4/5 with functional activity   Balance    End of Session  PT - End of Session Equipment Utilized During Treatment: Oxygen Activity Tolerance: Patient tolerated treatment well Patient left: in chair;with call bell/phone within reach  GP     Rebeca Alert [facility] 07/13/2012, 11:36 AM [phone]

## 2012-07-13 NOTE — Progress Notes (Signed)
2 Days Post-Op  Subjective: No flatus, pain controlled, no real complaints  Objective: Vital signs in last 24 hours: Temp:  [98.3 F (36.8 C)-99.9 F (37.7 C)] 99.9 F (37.7 C) (12/12 0522) Pulse Rate:  [66-96] 94  (12/12 0522) Resp:  [14-21] 16  (12/12 0522) BP: (110-135)/(55-69) 135/69 mmHg (12/12 0522) SpO2:  [97 %-100 %] 100 % (12/12 0522)    Intake/Output from previous day: 12/11 0701 - 12/12 0700 In: 1901.7 [I.V.:1901.7] Out: 1400 [Urine:950; Emesis/NG output:450] Intake/Output this shift: Total I/O In: 800 [I.V.:800] Out: 500 [Urine:350; Emesis/NG output:150]  General appearance: no distress Resp: diminished breath sounds bibasilar Cardio: regular rate and rhythm GI: approp tender wound clean without infection open, few bs  Lab Results:   Basename 07/12/12 0430  WBC 13.2*  HGB 11.5*  HCT 35.3*  PLT 195   BMET  Basename 07/13/12 0426 07/12/12 0430  NA 139 139  K 3.4* 3.8  CL 105 105  CO2 27 26  GLUCOSE 110* 118*  BUN 10 12  CREATININE 1.00 1.13*  CALCIUM 8.5 9.0    Assessment/Plan: POD 2 port removal colostomy takedown 1. Neuro- continue pca until tolerating po 2. CV/Pulm- needs to be out of bed as much as possible, aggressive pulm toilet 3. GI- some bs, will clamp ng, await ileus to resolve 4. Renal- replace K, uop pretty good but measurement not completely accurate, continue follow 5. Bid dressing changes 6. Sq heparin.scds 7. Will try to contact ophtho about her corneal ulcer  Jana Swartzlander 07/13/2012

## 2012-07-13 NOTE — Progress Notes (Signed)
Received patient from Jamelle Rushing, RN per her IV team aware that patient need IV restart and IV stopped at this time.

## 2012-07-14 LAB — CBC
MCH: 29.3 pg (ref 26.0–34.0)
MCHC: 32.4 g/dL (ref 30.0–36.0)
MCV: 90.4 fL (ref 78.0–100.0)
Platelets: 183 10*3/uL (ref 150–400)
RDW: 16.4 % — ABNORMAL HIGH (ref 11.5–15.5)
WBC: 6.9 10*3/uL (ref 4.0–10.5)

## 2012-07-14 LAB — BASIC METABOLIC PANEL
CO2: 29 mEq/L (ref 19–32)
Calcium: 9 mg/dL (ref 8.4–10.5)
Creatinine, Ser: 0.84 mg/dL (ref 0.50–1.10)

## 2012-07-14 MED ORDER — ONDANSETRON HCL 4 MG/2ML IJ SOLN: 4.0000 mg | Freq: Four times a day (QID) | INTRAMUSCULAR | Status: DC | PRN

## 2012-07-14 MED ORDER — DIPHENHYDRAMINE HCL 50 MG/ML IJ SOLN: 12.5000 mg | Freq: Four times a day (QID) | INTRAMUSCULAR | Status: DC | PRN

## 2012-07-14 MED ORDER — DIPHENHYDRAMINE HCL 12.5 MG/5ML PO ELIX: 12.5000 mg | ORAL_SOLUTION | Freq: Four times a day (QID) | ORAL | Status: DC | PRN

## 2012-07-14 MED ORDER — POTASSIUM CHLORIDE 10 MEQ/100ML IV SOLN
10.0000 meq | INTRAVENOUS | Status: AC
  Administered 2012-07-14 (×4): 10 meq via INTRAVENOUS
  Filled 2012-07-14 (×4): qty 100

## 2012-07-14 MED ORDER — NALOXONE HCL 0.4 MG/ML IJ SOLN: 0.4000 mg | INTRAMUSCULAR | Status: DC | PRN

## 2012-07-14 MED ORDER — MORPHINE SULFATE (PF) 1 MG/ML IV SOLN
INTRAVENOUS | Status: DC
  Administered 2012-07-14: 1.5 mg via INTRAVENOUS
  Administered 2012-07-16: 1 mg via INTRAVENOUS
  Administered 2012-07-16: 2 mg via INTRAVENOUS
  Administered 2012-07-16: 1.5 mg via INTRAVENOUS
  Administered 2012-07-16: 13:00:00 via INTRAVENOUS
  Administered 2012-07-17: 1 mg via INTRAVENOUS
  Filled 2012-07-14: qty 25

## 2012-07-14 MED ORDER — SODIUM CHLORIDE 0.9 % IJ SOLN: 9.0000 mL | INTRAMUSCULAR | Status: DC | PRN

## 2012-07-14 NOTE — Progress Notes (Signed)
3 Days Post-Op  Subjective: No flatus, no n/v, tol ng clamped yesterday, only up once she thinks, voiding alot  Objective: Vital signs in last 24 hours: Temp:  [99.1 F (37.3 C)-100.4 F (38 C)] 99.1 F (37.3 C) (12/13 0610) Pulse Rate:  [94-109] 100  (12/13 0610) Resp:  [16-21] 17  (12/13 0610) BP: (112-145)/(49-72) 131/72 mmHg (12/13 0610) SpO2:  [66 %-100 %] 100 % (12/13 0610)    Intake/Output from previous day: 12/12 0701 - 12/13 0700 In: 2998.3 [I.V.:2683.3; NG/GT:15; IV Piggyback:300] Out: 4770 [Urine:4300; Emesis/NG output:470] Intake/Output this shift:    General appearance: no distress Resp: diminished breath sounds bibasilar Cardio: regular rate and rhythm GI: wound clean, open, no infection, some bs present, approp tender  Lab Results:   Basename 07/14/12 0410 07/12/12 0430  WBC 6.9 13.2*  HGB 11.0* 11.5*  HCT 33.9* 35.3*  PLT 183 195   BMET  Basename 07/14/12 0410 07/13/12 0426  NA 138 139  K 3.7 3.4*  CL 103 105  CO2 29 27  GLUCOSE 112* 110*  BUN 6 10  CREATININE 0.84 1.00  CALCIUM 9.0 8.5    Assessment/Plan: POD 3 colostomy takedown, port removal 1. Neuro- continue pca until tolerating po, I am waiting on getting ophtho notes and a call from her dr at [facility]  Will continue eye drops until then 2. CV/Pulm- she needs to be oob more and ambulated 3. GI- await ileus resolve, she has bs today and tol ng clamped, will remove today, keep at sips 4. Renal- good uop, lytes stable, decrease ivf 5. Sq heparin, scds   Jasmine Clayton 07/14/2012

## 2012-07-15 NOTE — Progress Notes (Signed)
4 Days Post-Op  Subjective: No N/V.  No flatus or BM.  Feels rumbling in her abdomen.  Walking.  Objective: Vital signs in last 24 hours: Temp:  [99.3 F (37.4 C)-99.7 F (37.6 C)] 99.3 F (37.4 C) (12/14 0620) Pulse Rate:  [97-107] 98  (12/14 0620) Resp:  [13-21] 13  (12/14 0732) BP: (114-135)/(74-87) 114/74 mmHg (12/14 0620) SpO2:  [95 %-99 %] 98 % (12/14 0620) Last BM Date: 07/11/12  Intake/Output from previous day: 12/13 0701 - 12/14 0700 In: 2342.9 [I.V.:1942.9; IV Piggyback:400] Out: 2265 [Urine:2225; Emesis/NG output:40] Intake/Output this shift:    PE: General- In NAD Abdomen-soft and obese, open wound is clean with intact fascia, active bowel sounds  Lab Results:   [facility] 07/14/12 0410  WBC 6.9  HGB 11.0*  HCT 33.9*  PLT 183   BMET  Basename 07/14/12 0410 07/13/12 0426  NA 138 139  K 3.7 3.4*  CL 103 105  CO2 29 27  GLUCOSE 112* 110*  BUN 6 10  CREATININE 0.84 1.00  CALCIUM 9.0 8.5   PT/INR No results found for this basename: LABPROT:2,INR:2 in the last 72 hours Comprehensive Metabolic Panel:    Component Value Date/Time   NA 138 07/14/2012 0410   K 3.7 07/14/2012 0410   CL 103 07/14/2012 0410   CO2 29 07/14/2012 0410   BUN 6 07/14/2012 0410   CREATININE 0.84 07/14/2012 0410   CREATININE 0.98 03/15/2011 0828   GLUCOSE 112* 07/14/2012 0410   CALCIUM 9.0 07/14/2012 0410   AST 26 07/05/2012 1220   ALT 20 07/05/2012 1220   ALKPHOS 125* 07/05/2012 1220   BILITOT 0.4 07/05/2012 1220   PROT 7.2 07/05/2012 1220   ALBUMIN 3.6 07/05/2012 1220     Studies/Results: No results found.  Anti-infectives: Anti-infectives     Start     Dose/Rate Route Frequency Ordered Stop   07/11/12 1900   metroNIDAZOLE (FLAGYL) IVPB 500 mg        500 mg 100 mL/hr over 60 Minutes Intravenous Every 8 hours 07/11/12 1505 07/12/12 1200   07/11/12 1800   ceFAZolin (ANCEF) IVPB 2 g/50 mL premix        2 g 100 mL/hr over 30 Minutes Intravenous 3 times per day 07/11/12  1505 07/12/12 1352   07/11/12 0915   metroNIDAZOLE (FLAGYL) IVPB 500 mg        500 mg 100 mL/hr over 60 Minutes Intravenous  Once 07/11/12 0903 07/11/12 1045   07/11/12 0903   ceFAZolin (ANCEF) IVPB 2 g/50 mL premix        2 g 100 mL/hr over 30 Minutes Intravenous On call to O.R. 07/11/12 0903 07/11/12 1035          Assessment Post colostomy takedown, port removal on 07/11/12-still with some ileus    LOS: 4 days   Plan: Try clear liquids.   Jasmine Clayton J 07/15/2012

## 2012-07-16 LAB — BASIC METABOLIC PANEL
Calcium: 9.2 mg/dL (ref 8.4–10.5)
GFR calc non Af Amer: 68 mL/min — ABNORMAL LOW (ref >90)
Glucose, Bld: 167 mg/dL — ABNORMAL HIGH (ref 70–99)
Sodium: 134 mEq/L — ABNORMAL LOW (ref 135–145)

## 2012-07-16 NOTE — Progress Notes (Signed)
Patient ID: Jasmine Clayton, female   DOB: 1950-12-24, 61 y.o.   MRN: [id number] [facility] Progress Note:   5 Days Post-Op  Problem List: Patient Active Problem List  Diagnosis  . Heme positive stool  . Morbid obesity  . Elevated blood pressure reading  . Ovarian mass  . Adenocarcinoma of colon  . Ovarian tumor  . Vision problems  . Routine general medical examination at a health care facility   Subjective:  has been up bathing.  No complaints Objective: Vital signs in last 24 hours: Temp:  [98.3 F (36.8 C)-99.6 F (37.6 C)] 99.6 F (37.6 C) (12/15 0548) Pulse Rate:  [82-94] 94  (12/15 0548) Resp:  [12-18] 12  (12/15 0830) BP: (120-136)/(70-87) 120/70 mmHg (12/15 0548) SpO2:  [96 %-99 %] 97 % (12/15 0830) Physical Exam: wound is pink for the most part.  Looks good.  Flatus  Lab Results:  No results found for this or any previous visit (from the past 48 hour(s)). Radiology/Results: No results found. Assessment/Plan: Problem List: Patient Active Problem List  Diagnosis  . Heme positive stool  . Morbid obesity  . Elevated blood pressure reading  . Ovarian mass  . Adenocarcinoma of colon  . Ovarian tumor  . Vision problems  . Routine general medical examination at a health care facility   Will advance to full liquid diet.  Coming along well after colostomy closure 5 Days Post-Op   LOS: 5 days   Matt B. Daphine Deutscher, MD, [facility] [phone] beeper [phone]  07/16/2012 9:31 AM

## 2012-07-17 MED ORDER — OXYCODONE-ACETAMINOPHEN 5-325 MG PO TABS: 1.0000 | ORAL_TABLET | ORAL | Status: DC | PRN

## 2012-07-17 NOTE — Progress Notes (Signed)
Physical Therapy Treatment Patient Details Name: Jasmine Clayton MRN: [id number] DOB: March 19, 1951 Today's Date: 07/17/2012 Time: 1350-1410 PT Time Calculation (min): 20 min  PT Assessment / Plan / Recommendation Comments on Treatment Session  Mobilizing very well!!! Pt reports she has been walking in hallways without assistance. Do not feel pt has any further acute PT needs. Will d/c from PT.     Follow Up Recommendations  No PT follow up     Does the patient have the potential to tolerate intense rehabilitation     Barriers to Discharge        Equipment Recommendations  None recommended by PT    Recommendations for Other Services    Frequency     Plan All goals met and education completed, patient dischaged from PT services    Precautions / Restrictions Precautions Precautions: None Precaution Comments: abdominal incision Restrictions Weight Bearing Restrictions: No   Pertinent Vitals/Pain No c/o    Mobility  Bed Mobility Bed Mobility: Supine to Sit;Sit to Supine Supine to Sit: HOB elevated;6: Modified independent (Device/Increase time);With rails Sit to Supine: HOB elevated;6: Modified independent (Device/Increase time);With rail Transfers Transfers: Sit to Stand;Stand to Sit Sit to Stand: 6: Modified independent (Device/Increase time);From bed Stand to Sit: 6: Modified independent (Device/Increase time);To bed Ambulation/Gait Ambulation/Gait Assistance: 6: Modified independent (Device/Increase time) Ambulation Distance (Feet): 1400 Feet Assistive device: Rolling walker Ambulation/Gait Assistance Details: Pt pushed IV pole.  No LOB.  Gait Pattern: Within Functional Limits Stairs: Yes Stairs Assistance: 5: Supervision Stair Management Technique: One rail Left;Forwards;Step to pattern Number of Stairs: 4     Exercises     PT Diagnosis:    PT Problem List:   PT Treatment Interventions:     PT Goals Acute Rehab PT Goals Pt will go Supine/Side to Sit: with  supervision PT Goal: Supine/Side to Sit - Progress: Met Pt will go Sit to Supine/Side: with supervision PT Goal: Sit to Supine/Side - Progress: Met Pt will go Sit to Stand: with supervision PT Goal: Sit to Stand - Progress: Met Pt will Ambulate: >150 feet;with supervision;with least restrictive assistive device PT Goal: Ambulate - Progress: Met Pt will Go Up / Down Stairs: 6-9 stairs;with supervision;with rail(s) PT Goal: Up/Down Stairs - Progress: Partly met (only able to practice 4 due to IV pole)  Visit Information  Last PT Received On: 07/17/12 Assistance Needed: +1    Subjective Data  Subjective: "I might give out, but I won't give in" Patient Stated Goal: Home   Cognition  Overall Cognitive Status: Appears within functional limits for tasks assessed/performed Arousal/Alertness: Awake/alert Orientation Level: Appears intact for tasks assessed Behavior During Session: [facility] for tasks performed    Balance     End of Session PT - End of Session Activity Tolerance: Patient tolerated treatment well Patient left: in bed;with call bell/phone within reach   GP     Rebeca Alert [facility] 07/17/2012, 2:57 PM [phone]

## 2012-07-17 NOTE — Progress Notes (Signed)
6 Days Post-Op  Subjective: Passing flatus and stool, doing well, pain controlled, ambulating well  Objective: Vital signs in last 24 hours: Temp:  [98.6 F (37 C)-99.3 F (37.4 C)] 98.8 F (37.1 C) (12/16 0603) Pulse Rate:  [81-88] 81  (12/16 0603) Resp:  [12-17] 16  (12/16 0603) BP: (104-143)/(68-76) 104/68 mmHg (12/16 0603) SpO2:  [96 %-100 %] 96 % (12/16 0603) Last BM Date: 07/16/12  Intake/Output from previous day: 12/15 0701 - 12/16 0700 In: 2101.3 [P.O.:240; I.V.:1861.3] Out: 850 [Urine:850] Intake/Output this shift:    General appearance: no distress Resp: clear to auscultation bilaterally Cardio: regular rate and rhythm GI: wound clean without infection, bs present, approp tender  Lab Results:  No results found for this basename: WBC:2,HGB:2,HCT:2,PLT:2 in the last 72 hours BMET  Basename 07/16/12 0900  NA 134*  K 4.2  CL 100  CO2 26  GLUCOSE 167*  BUN 8  CREATININE 0.90  CALCIUM 9.2   Assessment/Plan: POD 6 colostomy takedown 1. Neuro- dc pca today, po pain meds, cont eye drops 2. CV/Pulm- pulm toilet 3. GI- advance diet 4. Dc home tomorrow if tolerates diet, she is going to ask daughter if she is able to help with wound today, if not then will need home health  [facility] 07/17/2012

## 2012-07-18 MED ORDER — OXYCODONE-ACETAMINOPHEN 5-325 MG PO TABS: 1.0000 | ORAL_TABLET | ORAL | Status: DC | PRN

## 2012-07-18 NOTE — Progress Notes (Signed)
Patient discharged. Per patient her daughter will be being taught drsg changes by [facility] states her daughter did them for her previously, Roe Rutherford, case manager aware and states will have HH come out tonight. Rx given for percocet and patient states understanding of discharge instructions.

## 2012-07-18 NOTE — Progress Notes (Signed)
7 Days Post-Op  Subjective: tol diet, passing flatus and stool  Objective: Vital signs in last 24 hours: Temp:  [98.1 F (36.7 C)-98.8 F (37.1 C)] 98.1 F (36.7 C) (12/17 0531) Pulse Rate:  [85-110] 85  (12/17 0531) Resp:  [18] 18  (12/17 0531) BP: (106-117)/(72-76) 117/76 mmHg (12/17 0531) SpO2:  [93 %-97 %] 97 % (12/17 0531) Last BM Date: 07/17/12  Intake/Output from previous day: 12/16 0701 - 12/17 0700 In: 1032.3 [P.O.:520; I.V.:512.3] Out: 1300 [Urine:1300] Intake/Output this shift:    Resp: clear to auscultation bilaterally Cardio: regular rate and rhythm GI: soft approp tender bs present wound open  Lab Results:  No results found for this basename: WBC:2,HGB:2,HCT:2,PLT:2 in the last 72 hours BMET  Basename 07/16/12 0900  NA 134*  K 4.2  CL 100  CO2 26  GLUCOSE 167*  BUN 8  CREATININE 0.90  CALCIUM 9.2   PT/INR No results found for this basename: LABPROT:2,INR:2 in the last 72 hours ABG No results found for this basename: PHART:2,PCO2:2,PO2:2,HCO3:2 in the last 72 hours  Studies/Results: No results found.  Anti-infectives: Anti-infectives     Start     Dose/Rate Route Frequency Ordered Stop   07/11/12 1900   metroNIDAZOLE (FLAGYL) IVPB 500 mg        500 mg 100 mL/hr over 60 Minutes Intravenous Every 8 hours 07/11/12 1505 07/12/12 1200   07/11/12 1800   ceFAZolin (ANCEF) IVPB 2 g/50 mL premix        2 g 100 mL/hr over 30 Minutes Intravenous 3 times per day 07/11/12 1505 07/12/12 1352   07/11/12 0915   metroNIDAZOLE (FLAGYL) IVPB 500 mg        500 mg 100 mL/hr over 60 Minutes Intravenous  Once 07/11/12 0903 07/11/12 1045   07/11/12 0903   ceFAZolin (ANCEF) IVPB 2 g/50 mL premix        2 g 100 mL/hr over 30 Minutes Intravenous On call to O.R. 07/11/12 0903 07/11/12 1035          Assessment/Plan: POD 6 colostomy takedown Will dc today with follow up next week, home care for dressing changes   [facility] 07/18/2012

## 2012-07-20 NOTE — Discharge Summary (Signed)
Physician Discharge Summary  Patient ID: Jasmine Clayton MRN: [id number] DOB/AGE: May 04, 1951 61 y.o.  Admit date: 07/11/2012 Discharge date: 07/20/2012  Admission Diagnoses: Obesity Diabetes HTN History colon cancer Transverse loop colostomy  Discharge Diagnoses:  SAA Resolved ileus S/p colostomy takedown  Discharged Condition: good  Hospital Course: 37 yof who I know well from prior low anterior resection with loop ileostomy that required revision and performed a transverse loop colostomy. She has undergone chemotherapy and now is disease free.  She presented for ostomy takedown.  I did barium enema, exam and limited colonoscopy by Dr. Russella Dar prior to surgery.  She had a know incisional hernia as well that I told her I would not be able to repair at the same time.  She underwent transverse colostomy takedown which she tolerated well.  I left her incision open and this will heal by secondary intention.  Postoperatively she had an ileus which resolved fairly quickly and she is passing flatus and having bowel movements.  Pain is under good control with oral medications and she has been able to ambulate well.  Her pathology was benign.  Consults: None  Significant Diagnostic Studies: none  Treatments: surgery: transverse colostomy takedown    Disposition: 01-Home or Self Care  Discharge Orders    Future Appointments: Provider: Department: Dept Phone: Center:   07/24/2012 10:00 AM Windell Hummingbird [facility] MEDICAL ONCOLOGY [phone] None   07/24/2012 2:10 PM Emelia Loron, MD [facility], [place] [phone] None   11/23/2012 10:15 AM Dava Najjar Idelle Jo [facility] MEDICAL ONCOLOGY [phone] None   11/23/2012 10:45 AM Rana Snare, NP [facility] MEDICAL ONCOLOGY [phone] None       Medication List     As of 07/20/2012  7:12 AM    TAKE these medications         aspirin 81 MG tablet   Take 81 mg by mouth  daily.      atropine 1 % ophthalmic solution   Place 1 drop into the right eye at bedtime.      B-COMPLEX/B-12 PO   Take 1 tablet by mouth daily.      Calcium-Vitamin D3 600-500 MG-UNIT Caps   Take 1 tablet by mouth daily.      Fish Oil 1200 MG Caps   Take 1 capsule by mouth daily.      meloxicam 7.5 MG tablet   Commonly known as: MOBIC   Take 7.5 mg by mouth daily.      moxifloxacin 0.5 % ophthalmic solution   Commonly known as: VIGAMOX   Place 1 drop into the right eye every 4 (four) hours.      oxyCODONE-acetaminophen 5-325 MG per tablet   Commonly known as: PERCOCET/ROXICET   Take 1-2 tablets by mouth every 4 (four) hours as needed for pain.      PRESCRIPTION MEDICATION   FlurbiproFen 10%-DICLOFENAC 10%-GABAPENTIN 10%-LIDOCAINE 5%- COMPOUNDED MEDICATION TO BE APPLIED TO KNEE 3-4 TIMES DAILY           Follow-up Information    Follow up with [facility]. (RN for wound care)    Contact information:   [address] [place] [address] [phone]      Follow up with [facility], MD. In 1 week.   Contact information:   [address] [place] [address] [phone]          Signed: Emelia Loron 07/20/2012, 7:12 AM

## 2012-07-24 ENCOUNTER — Ambulatory Visit (INDEPENDENT_AMBULATORY_CARE_PROVIDER_SITE_OTHER): Admitting: General Surgery

## 2012-07-24 ENCOUNTER — Other Ambulatory Visit (HOSPITAL_BASED_OUTPATIENT_CLINIC_OR_DEPARTMENT_OTHER): Admitting: Lab

## 2012-07-24 ENCOUNTER — Encounter (INDEPENDENT_AMBULATORY_CARE_PROVIDER_SITE_OTHER): Payer: Self-pay | Admitting: General Surgery

## 2012-07-24 VITALS — BP 132/84 | HR 74 | Temp 97.8°F | Resp 18 | Ht 60.0 in | Wt 234.1 lb

## 2012-07-24 DIAGNOSIS — C20 Malignant neoplasm of rectum: Secondary | ICD-10-CM

## 2012-07-24 DIAGNOSIS — Z09 Encounter for follow-up examination after completed treatment for conditions other than malignant neoplasm: Secondary | ICD-10-CM

## 2012-07-24 DIAGNOSIS — C189 Malignant neoplasm of colon, unspecified: Secondary | ICD-10-CM

## 2012-07-24 NOTE — Progress Notes (Signed)
Subjective:     Patient ID: Jasmine Clayton, female   DOB: 10-30-1950, 61 y.o.   MRN: [id number]  HPI This is a 61 year old female I know from treatment of her cancer. She recently has undergone takedown of a colostomy. She has done well from this. Her pathology showed normal colostomy. She was discharged from the hospital tolerating a regular diet and having bowel movements. She continues to have bowel movements some of which are loose but this is improving. She is passing flatus. She is eating well without any complaints at all. She's doing dressing changes on her wound.  Review of Systems     Objective:   Physical Exam Healing granulating wound without infection    Assessment:     Status post colostomy takedown    Plan:     She's going to advance her diet a little bit now. She's going to continue it reduced activity. There do dressing changes once daily and I will plan on seeing her back in one month, she has any changes before then.

## 2012-07-25 ENCOUNTER — Telehealth: Payer: Self-pay | Admitting: *Deleted

## 2012-07-25 NOTE — Telephone Encounter (Signed)
Message copied by Phillis Knack on Tue Jul 25, 2012 10:33 AM ------      Message from: Wandalee Ferdinand      Created: Tue Jul 25, 2012 10:12 AM                   ----- Message -----         From: Ladene Artist, MD         Sent: 07/24/2012   9:11 PM           To: Wandalee Ferdinand, RN, Glori Luis, RN, #            Please call patient, cea is normal, repeat at 10/2012 visit

## 2012-07-25 NOTE — Telephone Encounter (Signed)
Notified pt cea normal, will recheck at 4/14 visit

## 2012-07-27 ENCOUNTER — Telehealth (INDEPENDENT_AMBULATORY_CARE_PROVIDER_SITE_OTHER): Payer: Self-pay

## 2012-07-27 NOTE — Telephone Encounter (Signed)
Amy from [facility] called to say patients wound looks nicely healed and wants to know instead of patient getting BID saline dressing changes could they switch to daily hydrogel dressings since patient is paying for supplies herself. It would be more cost effective for patient. I told her wakefield is out of office through the new year but I would send a message to him and nurse and see if we can get a response from him even though he is out of office or that maybe his nurse would know if it is okay to switch.

## 2012-07-28 ENCOUNTER — Encounter (INDEPENDENT_AMBULATORY_CARE_PROVIDER_SITE_OTHER): Admitting: General Surgery

## 2012-08-04 ENCOUNTER — Telehealth (INDEPENDENT_AMBULATORY_CARE_PROVIDER_SITE_OTHER): Payer: Self-pay

## 2012-08-04 NOTE — Telephone Encounter (Signed)
Called Amy w/[facility] to let her know that Dr Dwain Sarna did ok for pt to get daily Hydrogel dressings.

## 2012-08-14 ENCOUNTER — Ambulatory Visit (INDEPENDENT_AMBULATORY_CARE_PROVIDER_SITE_OTHER): Admitting: General Surgery

## 2012-08-14 ENCOUNTER — Encounter (INDEPENDENT_AMBULATORY_CARE_PROVIDER_SITE_OTHER): Payer: Self-pay | Admitting: General Surgery

## 2012-08-14 VITALS — BP 130/78 | HR 76 | Temp 96.9°F | Resp 18 | Ht 60.0 in | Wt 235.4 lb

## 2012-08-14 DIAGNOSIS — Z09 Encounter for follow-up examination after completed treatment for conditions other than malignant neoplasm: Secondary | ICD-10-CM

## 2012-08-14 NOTE — Progress Notes (Signed)
Subjective:     Patient ID: Marcellina Millin, female   DOB: Dec 16, 1950, 62 y.o.   MRN: [id number]  HPI This is a 62 year old female who I know well. I did a colostomy takedown on her. She has a wound that is healing from  secondary intention right now. She is having no trouble with this. She is able to tolerate meals and is having normal bowel movements. These are between 1 and 3 per day at this point.  Review of Systems     Objective:   Physical Exam Healing wound ventral hernia that is present. This is about 5 cm in length and about 3 mm wide. This is very superficial.    Assessment:     Status post colostomy takedown Open wound    Plan:     This could just be covered I think this will heal by secondary intention hopefully over the next couple of weeks. I will plan on seeing her back in one month.

## 2012-08-28 DIAGNOSIS — H179 Unspecified corneal scar and opacity: Secondary | ICD-10-CM | POA: Insufficient documentation

## 2012-09-11 ENCOUNTER — Ambulatory Visit (INDEPENDENT_AMBULATORY_CARE_PROVIDER_SITE_OTHER): Admitting: General Surgery

## 2012-09-11 ENCOUNTER — Encounter (INDEPENDENT_AMBULATORY_CARE_PROVIDER_SITE_OTHER): Payer: Self-pay | Admitting: General Surgery

## 2012-09-11 VITALS — BP 144/72 | HR 80 | Resp 16 | Ht 60.0 in | Wt 240.0 lb

## 2012-09-11 DIAGNOSIS — Z09 Encounter for follow-up examination after completed treatment for conditions other than malignant neoplasm: Secondary | ICD-10-CM

## 2012-09-11 NOTE — Progress Notes (Signed)
Subjective:     Patient ID: Jasmine Clayton, female   DOB: 06-27-1951, 62 y.o.   MRN: [id number]  HPI This is a 62 year old female who I know very well. I've recently taken her back and did a colostomy takedown and port removal. She's done very well from this. Her wound is now finally healed. She comes in today with some mild low back pain on either side but is otherwise doing well. She is having bowel movements and passing gas. She is not able to discriminate entirely what she is going to pass but this is getting better. She reports no real significant complaints today.  Review of Systems     Objective:   Physical Exam Healed wound with significant scar tissue all around. This is consistent with her prior keloids she has as well. There is no evidence of infection.    Assessment:     Status post colostomy takedown     Plan:     She do well and will follow for cancer with medical oncology. She does still have a ventral hernia. I think following this a right now to make sure she recovers from the surgery and to see where she is in 6 months would be the best idea. I will see her back then.

## 2012-09-15 ENCOUNTER — Other Ambulatory Visit: Payer: Self-pay | Admitting: Oncology

## 2012-09-15 NOTE — Telephone Encounter (Signed)
Please forward to primary care, no longer coming here for treatment, [name], NP @ [facility]

## 2012-11-23 ENCOUNTER — Other Ambulatory Visit (HOSPITAL_BASED_OUTPATIENT_CLINIC_OR_DEPARTMENT_OTHER): Admitting: Lab

## 2012-11-23 ENCOUNTER — Telehealth: Payer: Self-pay | Admitting: Oncology

## 2012-11-23 ENCOUNTER — Ambulatory Visit (HOSPITAL_BASED_OUTPATIENT_CLINIC_OR_DEPARTMENT_OTHER): Admitting: Nurse Practitioner

## 2012-11-23 VITALS — BP 160/79 | HR 72 | Temp 97.0°F | Resp 20 | Ht 60.0 in | Wt 249.1 lb

## 2012-11-23 DIAGNOSIS — C189 Malignant neoplasm of colon, unspecified: Secondary | ICD-10-CM

## 2012-11-23 DIAGNOSIS — D696 Thrombocytopenia, unspecified: Secondary | ICD-10-CM

## 2012-11-23 DIAGNOSIS — D509 Iron deficiency anemia, unspecified: Secondary | ICD-10-CM

## 2012-11-23 DIAGNOSIS — C20 Malignant neoplasm of rectum: Secondary | ICD-10-CM

## 2012-11-23 DIAGNOSIS — Z809 Family history of malignant neoplasm, unspecified: Secondary | ICD-10-CM

## 2012-11-23 LAB — CEA: CEA: 1.1 ng/mL (ref 0.0–5.0)

## 2012-11-23 NOTE — Telephone Encounter (Signed)
gv and printed appt sched and avs for pt....gv pt barium   °

## 2012-11-23 NOTE — Progress Notes (Signed)
OFFICE PROGRESS NOTE  Interval history:  Jasmine Clayton returns as scheduled. She underwent colostomy reversal and Port-A-Cath removal on 07/11/2012. She feels well. She has a good appetite. No abdominal pain. Bowels moving regularly. No bloody or black stools. No nausea or vomiting. She has persistent right knee pain.   Objective: Blood pressure 160/79, pulse 72, temperature 97 F (36.1 C), temperature source Oral, resp. rate 20, height 5' (1.524 m), weight 249 lb 1.6 oz (112.991 kg), last menstrual period 11/30/2009.  Oropharynx is without thrush or ulceration. No palpable cervical, supraclavicular, axillary or inguinal lymph nodes. Lungs are clear. Regular cardiac rhythm. Abdomen is soft. No hepatomegaly. Multiple abdominal wall scars with keloid formation. Extremities without edema.  Lab Results: Lab Results  Component Value Date   WBC 6.9 07/14/2012   HGB 11.0* 07/14/2012   HCT 33.9* 07/14/2012   MCV 90.4 07/14/2012   PLT 183 07/14/2012    Chemistry:    Chemistry      Component Value Date/Time   NA 134* 07/16/2012 0900   K 4.2 07/16/2012 0900   CL 100 07/16/2012 0900   CO2 26 07/16/2012 0900   BUN 8 07/16/2012 0900   CREATININE 0.90 07/16/2012 0900   CREATININE 0.98 03/15/2011 0828      Component Value Date/Time   CALCIUM 9.2 07/16/2012 0900   ALKPHOS 125* 07/05/2012 1220   AST 26 07/05/2012 1220   ALT 20 07/05/2012 1220   BILITOT 0.4 07/05/2012 1220       Studies/Results: No results found.  Medications: I have reviewed the patient's current medications.  Assessment/Plan:  1. 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 She began adjuvant FOLFOX chemotherapy 07/26/2011. She completed cycle 6 on 10/07/2011. She began radiation and continuous infusion 5-fluorouracil on 10/25/2011, completed 12/01/2011. FOLFOX resumed for an additional 3 cycles  beginning 12/16/2011. She completed the final cycle of chemotherapy 01/13/2012. Restaging CT's 03/08/2012-negative.Colonoscopy on 05/10/2012-negative. 2. 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 3. Open abdominal wound. The wound has healed. 4. History of a DVT while pregnant in 1979. 5. Normal preoperative CEA (1.0 on 05/14/2011). Normal CEA (0.6) on 01/13/2012. CEA 1.2 on 05/25/2012. CEA 1.4 on 07/24/2012. 6. Family history of cancer including endometrial cancer in her mother and colon cancer in her sister. The tumor was submitted for microsatellite instability testing. The tumor showed no evidence of microsatellite instability by PCR. 7. Microcytic anemia. Ferritin was normal at 78 on 09/23/2011. Hemoglobin is normal. 8. History of thrombocytopenia secondary to chemotherapy. The platelet count was in normal range on 05/25/2012.  9. Diarrhea secondary to radiation and chemotherapy. Resolved. 10. Right knee discomfort. She is followed by orthopedics. 11. Status post colostomy reversal and Port-A-Cath removal 07/11/2012.   Disposition-Ms. Joos remains in clinical remission from rectal cancer. We will followup on the CEA from today. She will have restaging CT scans in August of this year with a followup visit a few weeks later to review the results. She will contact the office in the interim with any problems.  Plan reviewed with Dr. Truett Perna.     Lonna Cobb ANP/GNP-BC

## 2012-11-30 ENCOUNTER — Telehealth: Payer: Self-pay | Admitting: *Deleted

## 2012-11-30 NOTE — Telephone Encounter (Signed)
Message copied by Wandalee Ferdinand on Thu Nov 30, 2012  5:34 PM ------      Message from: Ladene Artist      Created: Fri Nov 24, 2012 10:56 PM       Please call patient, cea is normal ------

## 2012-11-30 NOTE — Telephone Encounter (Signed)
Notified of CEA results. 

## 2013-01-15 ENCOUNTER — Telehealth: Payer: Self-pay | Admitting: Gynecologic Oncology

## 2013-01-15 NOTE — Telephone Encounter (Signed)
Office Visit:  Gyn Oncology  REASON FOR VISIT: Surveillance for low malignant potential tumor.    ASSESSMENT/PLAN  1. Stage IA low malignant potential tumor. She has been advised to follow up  with Dr. Macon Large in 6 months and GYN Oncology in 12 months.    HISTORY OF PRESENT ILLNESS: This is a 62 year old, last normal menstrual in 1998, who fell off a ladder and sustained a laceration to the right lower quadrant. Emergency room evaluation included a CT scan of the abdomen which noted the presence of 12.6 x 9.2 x 11.3 cm mass. She underwent a total abdominal hysterectomy, bilateral salpingo- oophorectomy and intragastric omentectomy on Dec 09, 2009. 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  Patient states that she had a colonoscopy to evaluate rectal bleeding and a T1 N1 colonic malignancy  She underwent a low anterior resection on 05/19/2011 and completed FOLFOX chemotherapy on 01/13/2012. Colostomy takedown occurred 07/2012.   Past Medical History  Diagnosis Date  . DVT (deep vein thrombosis) in pregnancy 1979    during pregnancy. lower extremity  . Stress incontinence, female     not current  . Morbid obesity   . 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 History of chemotherapy      cycle 5 Folfox completed 09/23/11  . History of radiation therapy 10/25/11 -12/01/11    pelvis/presacral region  . 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 Degenerative joint disease     right knee  . Corneal ulcers and infections 07/04/12   Past Surgical History  Procedure Laterality Date  . Total abdominal hysterectomy w/ bilateral salpingoophorectomy  11/30/09    BSO  . Total hip arthroplasty  2002    left hip  . Mass excision  2011    "mass removed from ovaries"  . Cesarean section    . Keloid excision  1982  . Joint  replacement  2002    hip replacement  . Colostomy  05/27/11  . Portacath placement  07/26/11    right portacath  . Abdominal hysterectomy  3 years ago  . Colon surgery      LAR, reoperation for necrotic ileostomy, transverse colostomy  . Breast cyst excision  1978  . Colostomy takedown  07/11/2012    Procedure: COLOSTOMY TAKEDOWN;  Surgeon: Emelia Loron, MD;  Location: [facility];  Service: General;  Laterality: N/A;  colostomy takedown  . Port-a-cath removal  07/11/2012    Procedure: REMOVAL PORT-A-CATH;  Surgeon: Emelia Loron, MD;  Location: [facility];  Service: General;  Laterality: N/A;  removal portacath   History   Social History  . Marital Status: Married    Spouse Name: N/A    Number of Children: 1  . Years of Education: N/A   Occupational History  . 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 Smoking status: Never Smoker   . Smokeless tobacco: Never Used  . Alcohol Use: 0.0 oz/week     Comment: occasional  . Drug Use: No  . Sexually Active: Not on file   Other Topics Concern  . Not on file   Social History Narrative   Regular exercise:  No   Caffeine Use:  1 cup coffee daily   Married- lives with husband.  Daughter, son-in-law and her 2 children- they recently  moved out   Completed the 12th grade, and some college.   Does not work.   REVIEW OF SYSTEMS: Ten-point review of systems noteworthy for weight loss decreased appetite soft stool from the ostomy that is functioning well. She reports residual ulceration of her mons the from the radiotherapy.  Her appetite is poor but denies nausea or vomiting she denies vaginal bleeding  or rectal bleeding.  No shortness of breath or chest pain.   PHYSICAL EXAMINATION: GENERAL: Well-developed, obese female; in no  acute distress.  BP 110/70  Pulse 68  Temp 98.1 F (36.7 C) (Oral)  Resp 18  Ht 5' 0.75" (1.543 m)  Wt 226 lb 4.8 oz (102.649 kg)  BMI 43.11 kg/m2  LMP  11/30/2009 ABDOMEN: Soft, nontender, keloid formation noted at the site of laceration in the right lower quadrant,  nontender. The  mucosa of the colostomy is within normal limits there is air and soft stool within the bag. BACK:  No CVAT LN.  No cervical supraclavicular or inguinal adenopathy PELVIC EXAMINATION: Normal external genitalia,  Healing ulcerations on the mons and clitoral area.Normal  Bartholin's, urethral  and Skene's. Atrophic vagina. No masses. No discharge, no bleeding.  No nodularity in the cul-de-sac.  RECTAL EXAMINATION:Deferred. EXTREMITIES: A 1 to 2+ lower extremity edema bilaterally.

## 2013-01-16 ENCOUNTER — Encounter: Payer: Self-pay | Admitting: Gynecologic Oncology

## 2013-01-16 ENCOUNTER — Ambulatory Visit: Attending: Gynecologic Oncology | Admitting: Gynecologic Oncology

## 2013-01-16 VITALS — BP 128/80 | HR 70 | Temp 98.2°F | Resp 18 | Ht 60.75 in | Wt 249.3 lb

## 2013-01-16 DIAGNOSIS — Z90710 Acquired absence of both cervix and uterus: Secondary | ICD-10-CM | POA: Insufficient documentation

## 2013-01-16 DIAGNOSIS — R609 Edema, unspecified: Secondary | ICD-10-CM | POA: Insufficient documentation

## 2013-01-16 DIAGNOSIS — Z6841 Body Mass Index (BMI) 40.0 and over, adult: Secondary | ICD-10-CM | POA: Insufficient documentation

## 2013-01-16 DIAGNOSIS — Z86718 Personal history of other venous thrombosis and embolism: Secondary | ICD-10-CM | POA: Insufficient documentation

## 2013-01-16 DIAGNOSIS — Z9221 Personal history of antineoplastic chemotherapy: Secondary | ICD-10-CM | POA: Insufficient documentation

## 2013-01-16 DIAGNOSIS — Z9079 Acquired absence of other genital organ(s): Secondary | ICD-10-CM | POA: Insufficient documentation

## 2013-01-16 DIAGNOSIS — D3910 Neoplasm of uncertain behavior of unspecified ovary: Secondary | ICD-10-CM

## 2013-01-16 DIAGNOSIS — Z85038 Personal history of other malignant neoplasm of large intestine: Secondary | ICD-10-CM | POA: Insufficient documentation

## 2013-01-16 DIAGNOSIS — D4959 Neoplasm of unspecified behavior of other genitourinary organ: Secondary | ICD-10-CM | POA: Insufficient documentation

## 2013-01-16 NOTE — Progress Notes (Signed)
Office Visit:  Gyn Oncology  REASON FOR VISIT: Surveillance for low malignant potential tumor.    ASSESSMENT/PLAN  1. Stage IA low malignant potential tumor no evidence of disease. She has been advised to follow up  with Dr. Macon Large in 6 months and GYN Oncology in 12 months.    HISTORY OF PRESENT ILLNESS: This is a 62 year old, last normal menstrual in 1998, who fell off a ladder and sustained a laceration to the right lower quadrant. Emergency room evaluation included a CT scan of the abdomen which noted the presence of 12.6 x 9.2 x 11.3 cm mass. She underwent a total abdominal hysterectomy, bilateral salpingo- oophorectomy and intragastric omentectomy on Dec 09, 2009. 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  Patient states that she had a colonoscopy to evaluate rectal bleeding and a T1 N1 colonic malignancy  She underwent a low anterior resection on 05/19/2011 and completed FOLFOX chemotherapy on 01/13/2012. Colostomy takedown occurred 07/2012. There is no evidence of recurrent low malignant potential tumor of the ovary at either of the last 2 surgical procedures.   Past Medical History  Diagnosis Date  . DVT (deep vein thrombosis) in pregnancy 1979    during pregnancy. lower extremity  . Stress incontinence, female     not current  . Morbid obesity   . 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 History of chemotherapy      cycle 5 Folfox completed 09/23/11  . History of radiation therapy 10/25/11 -12/01/11    pelvis/presacral region  . 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 Degenerative joint disease     right knee  . Corneal ulcers and infections 07/04/12   Past Surgical History  Procedure Laterality Date  . Total abdominal hysterectomy w/ bilateral salpingoophorectomy  11/30/09    BSO  . Total hip  arthroplasty  2002    left hip  . Mass excision  2011    "mass removed from ovaries"  . Cesarean section    . Keloid excision  1982  . Joint replacement  2002    hip replacement  . Colostomy  05/27/11  . Portacath placement  07/26/11    right portacath  . Abdominal hysterectomy  3 years ago  . Colon surgery      LAR, reoperation for necrotic ileostomy, transverse colostomy  . Breast cyst excision  1978  . Colostomy takedown  07/11/2012    Procedure: COLOSTOMY TAKEDOWN;  Surgeon: Emelia Loron, MD;  Location: [facility];  Service: General;  Laterality: N/A;  colostomy takedown  . Port-a-cath removal  07/11/2012    Procedure: REMOVAL PORT-A-CATH;  Surgeon: Emelia Loron, MD;  Location: [facility];  Service: General;  Laterality: N/A;  removal portacath   History   Social History  . Marital Status: Married    Spouse Name: N/A    Number of Children: 1  . Years of Education: N/A   Occupational History  . 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 Smoking status: Never Smoker   . Smokeless tobacco: Never Used  . Alcohol Use: 0.0 oz/week     Comment: occasional  . Drug Use: No  . Sexually Active: Not on file   Other Topics Concern  . Not on file   Social History Narrative   Regular exercise:  No   Caffeine Use:  1 cup coffee daily   Married- lives with husband.  Daughter, son-in-law and her 2 children- they recently moved out   Completed the 12th grade, and some college.   Does not work.   REVIEW OF SYSTEMS: Ten-point review of systems noteworthy for no nausea or vomiting good appetite with weight gain. No shortness of breath cough or hemoptysis. No abdominal pain or nausea. No bloating no changes in caliber of stool or rectal bleeding. No urinary frequency dysuria or incontinence no vaginal bleeding or discharge otherwise 10 point review of systems is noncontributory   PHYSICAL EXAMINATION: GENERAL: Well-developed, obese female; in no   acute distress.  BP 128/80  Pulse 70  Temp(Src) 98.2 F (36.8 C) (Oral)  Resp 18  Ht 5' 0.75" (1.543 m)  Wt 249 lb 4.8 oz (113.082 kg)  BMI 47.5 kg/m2  LMP 11/30/2009 ABDOMEN: Soft, nontender, multiple keloid formation noted at the site of laceration in the right lower quadrant and at the site of surgical excision. There is evidence of a super incisional hernia  BACK:  No CVAT LN.  No cervical supraclavicular or inguinal adenopathy PELVIC EXAMINATION: Normal external genitalia,  Healing ulcerations on the mons and clitoral area.Normal  Bartholin's, urethral and Skene's. Atrophic vagina. No masses. No discharge, no bleeding.  No nodularity in the cul-de-sac.  RECTAL EXAMINATION:Deferred. EXTREMITIES: A 1 to 2+ lower extremity edema bilaterally.

## 2013-02-16 ENCOUNTER — Encounter: Payer: Self-pay | Admitting: Family

## 2013-02-16 ENCOUNTER — Ambulatory Visit (INDEPENDENT_AMBULATORY_CARE_PROVIDER_SITE_OTHER): Admitting: Family

## 2013-02-16 ENCOUNTER — Other Ambulatory Visit: Payer: Self-pay | Admitting: Family

## 2013-02-16 VITALS — BP 154/80 | HR 78 | Temp 98.4°F | Resp 16 | Ht 60.0 in | Wt 248.0 lb

## 2013-02-16 DIAGNOSIS — Z23 Encounter for immunization: Secondary | ICD-10-CM

## 2013-02-16 DIAGNOSIS — Z1231 Encounter for screening mammogram for malignant neoplasm of breast: Secondary | ICD-10-CM

## 2013-02-16 DIAGNOSIS — N838 Other noninflammatory disorders of ovary, fallopian tube and broad ligament: Secondary | ICD-10-CM

## 2013-02-16 DIAGNOSIS — N839 Noninflammatory disorder of ovary, fallopian tube and broad ligament, unspecified: Secondary | ICD-10-CM

## 2013-02-16 DIAGNOSIS — Z2911 Encounter for prophylactic immunotherapy for respiratory syncytial virus (RSV): Secondary | ICD-10-CM

## 2013-02-16 DIAGNOSIS — M1710 Unilateral primary osteoarthritis, unspecified knee: Secondary | ICD-10-CM

## 2013-02-16 DIAGNOSIS — C189 Malignant neoplasm of colon, unspecified: Secondary | ICD-10-CM

## 2013-02-16 DIAGNOSIS — R030 Elevated blood-pressure reading, without diagnosis of hypertension: Secondary | ICD-10-CM

## 2013-02-16 MED ORDER — HYDROCHLOROTHIAZIDE 25 MG PO TABS: 25.0000 mg | ORAL_TABLET | Freq: Every day | ORAL | Status: DC

## 2013-02-16 NOTE — Assessment & Plan Note (Signed)
This is being managed by GYN oncology.  Recently saw Dr. Laurette Schimke GYN and it was felt that mass represents Stage IA low malignant potential tumor no evidence of disease. She has been advised to follow up  with Dr. Macon Large in 6 months and GYN Oncology in 12 months per Dr.Brewster.

## 2013-02-16 NOTE — Progress Notes (Signed)
Subjective:    Patient ID: Jasmine Clayton, female    DOB: [birth year], 61 y.o.   MRN: [id number]  HPI  Jasmine Clayton is a 62 yr old female who presents today for follow up.  1) adenocarcinoma of the colon- she is s/p colectomy and is followed by oncology.  2) ovarian mass- she continues to follow with GYN for an ovarian mass which is felt to be   3) elevated blood pressure-  Currently not on meds.     4) R knee pain- she is following with ortho.  She is following with Dr. Eulah Pont who tells her she will ultimately need knee replacement.    Review of Systems See HPI  Past Medical History  Diagnosis Date  . DVT (deep vein thrombosis) in pregnancy 1979    during pregnancy. lower extremity  . Stress incontinence, female     not current  . Morbid obesity   . 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 History of chemotherapy      cycle 5 Folfox completed 09/23/11  . History of radiation therapy 10/25/11 -12/01/11    pelvis/presacral region  . 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 Degenerative joint disease     right knee  . Corneal ulcers and infections 07/04/12    History   Social History  . Marital Status: Married    Spouse Name: N/A    Number of Children: 1  . Years of Education: N/A   Occupational History  . 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 Smoking status: Never Smoker   . Smokeless tobacco: Never Used  . Alcohol Use: 0.0 oz/week     Comment: occasional  . Drug Use: No  . Sexually Active: Not on file   Other Topics Concern  . Not on file   Social History Narrative   Regular exercise:  No   Caffeine Use:  1 cup coffee daily   Married- lives with husband.  Daughter, son-in-law and her 2 children- they recently moved out   Completed the 12th grade, and some college.   Does not work.    Past Surgical History  Procedure Laterality Date  .  Total abdominal hysterectomy w/ bilateral salpingoophorectomy  11/30/09    BSO  . Total hip arthroplasty  2002    left hip  . Mass excision  2011    "mass removed from ovaries"  . Cesarean section    . Keloid excision  1982  . Joint replacement  2002    hip replacement  . Colostomy  05/27/11  . Portacath placement  07/26/11    right portacath  . Abdominal hysterectomy  3 years ago  . Colon surgery      LAR, reoperation for necrotic ileostomy, transverse colostomy  . Breast cyst excision  1978  . Colostomy takedown  07/11/2012    Procedure: COLOSTOMY TAKEDOWN;  Surgeon: Emelia Loron, MD;  Location: [facility];  Service: General;  Laterality: N/A;  colostomy takedown  . Port-a-cath removal  07/11/2012    Procedure: REMOVAL PORT-A-CATH;  Surgeon: Emelia Loron, MD;  Location: [facility];  Service: General;  Laterality: N/A;  removal portacath    Family History  Problem Relation Age of Onset  . Cancer Mother 64    endometrial cancer  . Diabetes Father   . Colon cancer Sister 35  . Cancer Sister  51    colon cancer currently undergoing chemotherapy  . Stomach cancer Neg Hx     No Known Allergies  Current Outpatient Prescriptions on File Prior to Visit  Medication Sig Dispense Refill  . aspirin 81 MG tablet Take 81 mg by mouth daily.        . B Complex Vitamins (B-COMPLEX/B-12 PO) Take 1 tablet by mouth daily.       . Calcium Carb-Cholecalciferol (CALCIUM-VITAMIN D3) 600-500 MG-UNIT CAPS Take 1 tablet by mouth daily.      . Omega-3 Fatty Acids (FISH OIL) 1200 MG CAPS Take 1 capsule by mouth daily.      Marland Kitchen PRESCRIPTION MEDICATION FlurbiproFen 10%-DICLOFENAC 10%-GABAPENTIN 10%-LIDOCAINE 5%- COMPOUNDED MEDICATION TO BE APPLIED TO KNEE 3-4 TIMES DAILY       Current Facility-Administered Medications on File Prior to Visit  Medication Dose Route Frequency Provider Last Rate Last Dose  . sodium chloride 0.9 % injection 10 mL  10 mL Intravenous PRN Ladene Artist, MD   10 mL at 05/25/12  1153    BP 154/80  Pulse 78  Temp(Src) 98.4 F (36.9 C) (Oral)  Resp 16  Ht 5' (1.524 m)  Wt 248 lb (112.492 kg)  BMI 48.43 kg/m2  SpO2 99%  LMP 11/30/2009       Objective:   Physical Exam  Constitutional: She is oriented to person, place, and time.  Pleasant, morbidly obese AA female in NAD  Cardiovascular: Normal rate and regular rhythm.   No murmur heard. Pulmonary/Chest: Effort normal and breath sounds normal. No respiratory distress. She has no wheezes. She has no rales. She exhibits no tenderness.  Lymphadenopathy:    She has no cervical adenopathy.  Neurological: She is alert and oriented to person, place, and time.  Psychiatric: She has a normal mood and affect. Her behavior is normal. Judgment and thought content normal.          Assessment & Plan:

## 2013-02-16 NOTE — Assessment & Plan Note (Signed)
S/p resection, chemo, radiation. She follows with oncology.  Management per oncology.

## 2013-02-16 NOTE — Assessment & Plan Note (Signed)
Management per Ortho.

## 2013-02-16 NOTE — Assessment & Plan Note (Addendum)
BP Readings from Last 3 Encounters:  02/16/13 154/80  01/16/13 128/80  11/23/12 160/79   Uncontrolled.  Start HCTZ, discussed low sodium diet, exercise weight loss.

## 2013-02-16 NOTE — Patient Instructions (Addendum)
Start HCTZ. Please follow up in 2 weeks.

## 2013-03-02 ENCOUNTER — Encounter: Payer: Self-pay | Admitting: Family

## 2013-03-02 ENCOUNTER — Ambulatory Visit (INDEPENDENT_AMBULATORY_CARE_PROVIDER_SITE_OTHER): Admitting: Family

## 2013-03-02 VITALS — BP 136/82 | HR 70 | Temp 98.1°F | Resp 18 | Ht 60.0 in | Wt 249.0 lb

## 2013-03-02 DIAGNOSIS — I10 Essential (primary) hypertension: Secondary | ICD-10-CM

## 2013-03-02 LAB — BASIC METABOLIC PANEL
CO2: 33 mEq/L — ABNORMAL HIGH (ref 19–32)
Calcium: 9.9 mg/dL (ref 8.4–10.5)
Creat: 1.1 mg/dL (ref 0.50–1.10)

## 2013-03-02 MED ORDER — HYDROCHLOROTHIAZIDE 25 MG PO TABS: 25.0000 mg | ORAL_TABLET | Freq: Every day | ORAL | Status: DC

## 2013-03-02 NOTE — Patient Instructions (Addendum)
Please follow up in 3 months. Complete lab work prior to leaving.  

## 2013-03-02 NOTE — Assessment & Plan Note (Signed)
BP is improved since starting hctz. Continue same, obtain follow up BMET.  BP Readings from Last 3 Encounters:  03/02/13 136/82  02/16/13 154/80  01/16/13 128/80

## 2013-03-02 NOTE — Progress Notes (Signed)
Subjective:    Patient ID: Jasmine Clayton, female    DOB: 22-Nov-1950, 62 y.o.   MRN: [id number]  HPI  Jasmine Clayton is a 62 yr old female who presents today for follow up of her hypertension.    She was started last visit on hctz.  Tolerating without difficulty. Denies CP/SOB wor swelling.    Review of Systems See HPI  Past Medical History  Diagnosis Date  . DVT (deep vein thrombosis) in pregnancy 1979    during pregnancy. lower extremity  . Stress incontinence, female     not current  . Morbid obesity   . 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 History of chemotherapy      cycle 5 Folfox completed 09/23/11  . History of radiation therapy 10/25/11 -12/01/11    pelvis/presacral region  . 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 Degenerative joint disease     right knee  . Corneal ulcers and infections 07/04/12    History   Social History  . Marital Status: Married    Spouse Name: N/A    Number of Children: 1  . Years of Education: N/A   Occupational History  . 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 Smoking status: Never Smoker   . Smokeless tobacco: Never Used  . Alcohol Use: 0.0 oz/week     Comment: occasional  . Drug Use: No  . Sexually Active: Not on file   Other Topics Concern  . Not on file   Social History Narrative   Regular exercise:  No   Caffeine Use:  1 cup coffee daily   Married- lives with husband.  Daughter, son-in-law and her 2 children- they recently moved out   Completed the 12th grade, and some college.   Does not work.    Past Surgical History  Procedure Laterality Date  . Total abdominal hysterectomy w/ bilateral salpingoophorectomy  11/30/09    BSO  . Total hip arthroplasty  2002    left hip  . Mass excision  2011    "mass removed from ovaries"  . Cesarean section    . Keloid excision  1982  . Joint replacement  2002     hip replacement  . Colostomy  05/27/11  . Portacath placement  07/26/11    right portacath  . Abdominal hysterectomy  3 years ago  . Colon surgery      LAR, reoperation for necrotic ileostomy, transverse colostomy  . Breast cyst excision  1978  . Colostomy takedown  07/11/2012    Procedure: COLOSTOMY TAKEDOWN;  Surgeon: Emelia Loron, MD;  Location: [facility];  Service: General;  Laterality: N/A;  colostomy takedown  . Port-a-cath removal  07/11/2012    Procedure: REMOVAL PORT-A-CATH;  Surgeon: Emelia Loron, MD;  Location: [facility];  Service: General;  Laterality: N/A;  removal portacath    Family History  Problem Relation Age of Onset  . Cancer Mother 34    endometrial cancer  . Diabetes Father   . Colon cancer Sister 27  . Cancer Sister 30    colon cancer currently undergoing chemotherapy  . Stomach cancer Neg Hx     No Known Allergies  Current Outpatient Prescriptions on File Prior to Visit  Medication Sig Dispense Refill  . aspirin 81 MG tablet Take 81 mg by mouth daily.        Marland Kitchen  B Complex Vitamins (B-COMPLEX/B-12 PO) Take 1 tablet by mouth daily.       . Calcium Carb-Cholecalciferol (CALCIUM-VITAMIN D3) 600-500 MG-UNIT CAPS Take 1 tablet by mouth daily.      . hydrochlorothiazide (HYDRODIURIL) 25 MG tablet Take 1 tablet (25 mg total) by mouth daily.  30 tablet  0  . Omega-3 Fatty Acids (FISH OIL) 1200 MG CAPS Take 1 capsule by mouth daily.      Marland Kitchen PRESCRIPTION MEDICATION FlurbiproFen 10%-DICLOFENAC 10%-GABAPENTIN 10%-LIDOCAINE 5%- COMPOUNDED MEDICATION TO BE APPLIED TO KNEE 3-4 TIMES DAILY       Current Facility-Administered Medications on File Prior to Visit  Medication Dose Route Frequency Provider Last Rate Last Dose  . sodium chloride 0.9 % injection 10 mL  10 mL Intravenous PRN Ladene Artist, MD   10 mL at 05/25/12 1153    BP 136/82  Pulse 70  Temp(Src) 98.1 F (36.7 C) (Oral)  Resp 18  Ht 5' (1.524 m)  Wt 249 lb 0.6 oz (112.964 kg)  BMI 48.64 kg/m2   SpO2 97%  LMP 11/30/2009       Objective:   Physical Exam  Constitutional: She is oriented to person, place, and time. She appears well-developed and well-nourished. No distress.  Cardiovascular: Normal rate and regular rhythm.   No murmur heard. Pulmonary/Chest: Effort normal and breath sounds normal. No respiratory distress. She has no wheezes. She has no rales. She exhibits no tenderness.  Musculoskeletal: She exhibits no edema.  Neurological: She is alert and oriented to person, place, and time.  Psychiatric: She has a normal mood and affect. Her behavior is normal. Judgment and thought content normal.          Assessment & Plan:

## 2013-03-05 ENCOUNTER — Encounter: Payer: Self-pay | Admitting: Family

## 2013-03-12 ENCOUNTER — Other Ambulatory Visit: Admitting: Lab

## 2013-03-12 ENCOUNTER — Ambulatory Visit (HOSPITAL_COMMUNITY)
Admission: RE | Admit: 2013-03-12 | Discharge: 2013-03-12 | Disposition: A | Discharge status: Home / Self Care | Source: Ambulatory Visit | Attending: Nurse Practitioner | Admitting: Nurse Practitioner

## 2013-03-12 ENCOUNTER — Other Ambulatory Visit (HOSPITAL_BASED_OUTPATIENT_CLINIC_OR_DEPARTMENT_OTHER): Admitting: Lab

## 2013-03-12 DIAGNOSIS — Z923 Personal history of irradiation: Secondary | ICD-10-CM | POA: Insufficient documentation

## 2013-03-12 DIAGNOSIS — C20 Malignant neoplasm of rectum: Secondary | ICD-10-CM

## 2013-03-12 DIAGNOSIS — Z9221 Personal history of antineoplastic chemotherapy: Secondary | ICD-10-CM | POA: Insufficient documentation

## 2013-03-12 LAB — CBC WITH DIFFERENTIAL/PLATELET
BASO%: 0.5 % (ref 0.0–2.0)
Basophils Absolute: 0 10*3/uL (ref 0.0–0.1)
Eosinophils Absolute: 0.1 10*3/uL (ref 0.0–0.5)
HCT: 39.4 % (ref 34.8–46.6)
HGB: 13.1 g/dL (ref 11.6–15.9)
LYMPH%: 19.6 % (ref 14.0–49.7)
MCHC: 33.1 g/dL (ref 31.5–36.0)
MONO#: 0.6 10*3/uL (ref 0.1–0.9)
NEUT%: 66.4 % (ref 38.4–76.8)
Platelets: 203 10*3/uL (ref 145–400)
WBC: 5.3 10*3/uL (ref 3.9–10.3)
lymph#: 1 10*3/uL (ref 0.9–3.3)

## 2013-03-12 LAB — COMPREHENSIVE METABOLIC PANEL (CC13)
AST: 22 U/L (ref 5–34)
Albumin: 3.5 g/dL (ref 3.5–5.0)
BUN: 17 mg/dL (ref 7.0–26.0)
CO2: 29 mEq/L (ref 22–29)
Calcium: 9.5 mg/dL (ref 8.4–10.4)
Chloride: 102 mEq/L (ref 98–109)
Creatinine: 1.1 mg/dL (ref 0.6–1.1)
Glucose: 101 mg/dl (ref 70–140)
Potassium: 3.6 mEq/L (ref 3.5–5.1)

## 2013-03-12 MED ORDER — IOHEXOL 300 MG/ML  SOLN
100.0000 mL | Freq: Once | INTRAMUSCULAR | Status: AC | PRN
  Administered 2013-03-12: 100 mL via INTRAVENOUS

## 2013-03-19 ENCOUNTER — Inpatient Hospital Stay (HOSPITAL_BASED_OUTPATIENT_CLINIC_OR_DEPARTMENT_OTHER): Admission: RE | Admit: 2013-03-19 | Source: Ambulatory Visit

## 2013-03-21 ENCOUNTER — Ambulatory Visit (HOSPITAL_BASED_OUTPATIENT_CLINIC_OR_DEPARTMENT_OTHER)
Admission: RE | Admit: 2013-03-21 | Discharge: 2013-03-21 | Disposition: A | Discharge status: Home / Self Care | Source: Ambulatory Visit | Attending: Family | Admitting: Family

## 2013-03-21 DIAGNOSIS — R928 Other abnormal and inconclusive findings on diagnostic imaging of breast: Secondary | ICD-10-CM | POA: Insufficient documentation

## 2013-03-21 DIAGNOSIS — Z1231 Encounter for screening mammogram for malignant neoplasm of breast: Secondary | ICD-10-CM | POA: Insufficient documentation

## 2013-03-26 ENCOUNTER — Telehealth: Payer: Self-pay | Admitting: Family

## 2013-03-26 ENCOUNTER — Other Ambulatory Visit: Payer: Self-pay | Admitting: Family

## 2013-03-26 ENCOUNTER — Ambulatory Visit: Admitting: Oncology

## 2013-03-26 DIAGNOSIS — R928 Other abnormal and inconclusive findings on diagnostic imaging of breast: Secondary | ICD-10-CM

## 2013-03-26 NOTE — Telephone Encounter (Signed)
Reviewed mammogram- asymmetry noted, radiologist is requesting diagnostic mammogram.  Phoned pt and notified her of results.  She has not yet been contacted by the breast center.  I advised pt to let me know if she has not been contacted in the next few days re: scheduling of additional imaging. She verbalizes understanding.

## 2013-03-28 ENCOUNTER — Telehealth: Payer: Self-pay | Admitting: Medical Oncology

## 2013-03-28 ENCOUNTER — Other Ambulatory Visit: Payer: Self-pay | Admitting: Medical Oncology

## 2013-03-28 ENCOUNTER — Telehealth: Payer: Self-pay

## 2013-03-28 NOTE — Telephone Encounter (Signed)
s.w. pt and advised on all Sept appts....pt ok and aware

## 2013-03-28 NOTE — Telephone Encounter (Signed)
I called pt to inform her that her CT was negative. Dr. Truett Perna would like to see her in a month and the schedulers will be calling her with this appointment. She voiced understanding.

## 2013-04-04 ENCOUNTER — Telehealth: Payer: Self-pay | Admitting: Family

## 2013-04-10 NOTE — Telephone Encounter (Signed)
Opened in error

## 2013-04-16 ENCOUNTER — Ambulatory Visit
Admission: RE | Admit: 2013-04-16 | Discharge: 2013-04-16 | Disposition: A | Discharge status: Home / Self Care | Source: Ambulatory Visit | Attending: Family | Admitting: Family

## 2013-04-16 DIAGNOSIS — R928 Other abnormal and inconclusive findings on diagnostic imaging of breast: Secondary | ICD-10-CM

## 2013-04-25 ENCOUNTER — Ambulatory Visit (HOSPITAL_BASED_OUTPATIENT_CLINIC_OR_DEPARTMENT_OTHER): Admitting: Nurse Practitioner

## 2013-04-25 ENCOUNTER — Telehealth: Payer: Self-pay | Admitting: Oncology

## 2013-04-25 VITALS — BP 147/86 | HR 73 | Temp 97.4°F | Resp 19 | Ht 61.0 in | Wt 253.9 lb

## 2013-04-25 DIAGNOSIS — C189 Malignant neoplasm of colon, unspecified: Secondary | ICD-10-CM

## 2013-04-25 NOTE — Telephone Encounter (Signed)
Gave pt appt for lab and MD  for April 2015 °

## 2013-04-25 NOTE — Progress Notes (Signed)
OFFICE PROGRESS NOTE  Interval history:  Jasmine Clayton returns for followup of stage IIIa rectal cancer. She overall is feeling well. Bowels moving regularly. She has intermittent mild constipation and takes a stool softener as needed. She denies any rectal bleeding. She has occasional mild abdominal pain. Stable dyspnea on exertion. She has an occasional cough. No fevers. She notes trace bilateral leg edema with prolonged standing. No calf pain. No chest pain. No unusual headaches or vision change. No hematuria or dysuria. She reports a good appetite and weight gain.   Objective: Blood pressure 147/86, pulse 73, temperature 97.4 F (36.3 C), temperature source Oral, resp. rate 19, height 5\' 1"  (1.549 m), weight 253 lb 14.4 oz (115.168 kg), last menstrual period 11/30/2009.  Oropharynx is without thrush or ulceration. No palpable cervical, supraclavicular, axillary or inguinal lymph nodes. Lungs are clear. No wheezes or rales. Regular cardiac rhythm. Abdomen is soft and nontender. No hepatomegaly. Multiple abdominal wall scars with keloid formation. Extremities without edema.  Lab Results: Lab Results  Component Value Date   WBC 5.3 03/12/2013   HGB 13.1 03/12/2013   HCT 39.4 03/12/2013   MCV 88.4 03/12/2013   PLT 203 03/12/2013    Chemistry:    Chemistry      Component Value Date/Time   NA 141 03/12/2013 0945   NA 141 03/02/2013 1112   K 3.6 03/12/2013 0945   K 3.5 03/02/2013 1112   CL 99 03/02/2013 1112   CO2 29 03/12/2013 0945   CO2 33* 03/02/2013 1112   BUN 17.0 03/12/2013 0945   BUN 17 03/02/2013 1112   CREATININE 1.1 03/12/2013 0945   CREATININE 1.10 03/02/2013 1112   CREATININE 0.90 07/16/2012 0900      Component Value Date/Time   CALCIUM 9.5 03/12/2013 0945   CALCIUM 9.9 03/02/2013 1112   ALKPHOS 123 03/12/2013 0945   ALKPHOS 125* 07/05/2012 1220   AST 22 03/12/2013 0945   AST 26 07/05/2012 1220   ALT 22 03/12/2013 0945   ALT 20 07/05/2012 1220   BILITOT 0.47 03/12/2013 0945   BILITOT 0.4  07/05/2012 1220       Studies/Results: US Breast Left  05/03/13   *RADIOLOGY REPORT*  Clinical Data:  Possible mass left breast identified on recent screening mammogram.  DIGITAL DIAGNOSTIC LEFT MAMMOGRAM WITHOUT CAD AND LEFT BREAST ULTRASOUND:  Comparison:  03/21/2013  Findings:  ACR Breast Density Category b:  There are scattered areas of fibroglandular density.  Focal spot compression views of the upper outer left breast show a small vague density which could be a small island of fibroglandular breast tissue or a low density nodule.  On physical exam, no mass is palpated in the upper outer quadrant left breast.  Ultrasound is performed, showing a 3 x 4 x 3 mm cyst at two o'clock position 5 cm from the nipple.  This is felt to be too close to the nipple to account for the area in question on the recent screening mammogram.  Ultrasound of the remainder of the outer left breast shows no additional cysts.  No solid mass or suspicious shadowing is seen.  IMPRESSION: Probably benign low-density nodule or island of fibroglandular breast tissue in the outer left breast.  A definite sonographic correlate is not seen.  RECOMMENDATION: Diagnostic left mammogram in 6 months is recommended.  I have discussed the findings and recommendations with the patient. Results were also provided in writing at the conclusion of the visit.  If applicable, a reminder letter will  be sent to the patient regarding the next appointment.   BI-RADS CATEGORY 3:  Probably benign finding(s) - short interval follow-up suggested.   Original Report Authenticated By: Britta Mccreedy, M.D.   [facility]  04/16/2013   *RADIOLOGY REPORT*  Clinical Data:  Possible mass left breast identified on recent screening mammogram.  DIGITAL DIAGNOSTIC LEFT MAMMOGRAM WITHOUT CAD AND LEFT BREAST ULTRASOUND:  Comparison:  03/21/2013  Findings:  ACR Breast Density Category b:  There are scattered areas of fibroglandular density.  Focal spot compression  views of the upper outer left breast show a small vague density which could be a small island of fibroglandular breast tissue or a low density nodule.  On physical exam, no mass is palpated in the upper outer quadrant left breast.  Ultrasound is performed, showing a 3 x 4 x 3 mm cyst at two o'clock position 5 cm from the nipple.  This is felt to be too close to the nipple to account for the area in question on the recent screening mammogram.  Ultrasound of the remainder of the outer left breast shows no additional cysts.  No solid mass or suspicious shadowing is seen.  IMPRESSION: Probably benign low-density nodule or island of fibroglandular breast tissue in the outer left breast.  A definite sonographic correlate is not seen.  RECOMMENDATION: Diagnostic left mammogram in 6 months is recommended.  I have discussed the findings and recommendations with the patient. Results were also provided in writing at the conclusion of the visit.  If applicable, a reminder letter will be sent to the patient regarding the next appointment.   BI-RADS CATEGORY 3:  Probably benign finding(s) - short interval follow-up suggested.   Original Report Authenticated By: Britta Mccreedy, M.D.    Medications: I have reviewed the patient's current medications.  Assessment/Plan:  1. 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 She began adjuvant FOLFOX chemotherapy 07/26/2011. She completed cycle 6 on 10/07/2011. She began radiation and continuous infusion 5-fluorouracil on 10/25/2011, completed 12/01/2011. FOLFOX resumed for an additional 3 cycles beginning 12/16/2011. She completed the final cycle of chemotherapy 01/13/2012. Restaging CT's 03/08/2012-negative. Colonoscopy on 05/10/2012-negative. Restaging CTs 03/12/2013 negative. 2. 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 3. Open abdominal wound. The wound has healed. 4. History of a DVT while pregnant in 1979. 5. Normal preoperative CEA (1.0 on 05/14/2011). Normal CEA (0.6) on 01/13/2012. CEA 1.2 on 05/25/2012. CEA 1.4 on 07/24/2012. CEA 1.2 on 03/12/2013. 6. Family history of cancer including endometrial cancer in her mother and colon cancer in her sister. The tumor was submitted for microsatellite instability testing. The tumor showed no evidence of microsatellite instability by PCR. 7. Microcytic anemia. Ferritin was normal at 78 on 09/23/2011. Hemoglobin is normal. 8. History of thrombocytopenia secondary to chemotherapy. The platelet count was in normal range on 05/25/2012.  9. Diarrhea secondary to radiation and chemotherapy. Resolved. 10. Right knee discomfort. She is followed by orthopedics. 11. Status post colostomy reversal and Port-A-Cath removal 07/11/2012.   Disposition-Ms. Jasmine Clayton remains in remission from rectal cancer. She will return for a followup visit and CEA in 6 months. She will contact the office in the interim with any problems.  Plan reviewed with Dr. Truett Perna.  Lonna Cobb ANP/GNP-BC

## 2013-06-04 ENCOUNTER — Encounter: Payer: Self-pay | Admitting: Family

## 2013-06-04 ENCOUNTER — Ambulatory Visit (INDEPENDENT_AMBULATORY_CARE_PROVIDER_SITE_OTHER): Admitting: Family

## 2013-06-04 VITALS — BP 134/88 | HR 81 | Temp 98.0°F | Resp 16 | Ht 60.0 in | Wt 255.9 lb

## 2013-06-04 DIAGNOSIS — I10 Essential (primary) hypertension: Secondary | ICD-10-CM

## 2013-06-04 DIAGNOSIS — Z23 Encounter for immunization: Secondary | ICD-10-CM

## 2013-06-04 NOTE — Assessment & Plan Note (Signed)
BP Readings from Last 3 Encounters:  06/04/13 134/88  04/25/13 147/86  03/02/13 136/82   BP remains stable.  Continue current meds.

## 2013-06-04 NOTE — Progress Notes (Signed)
Subjective:    Patient ID: Jasmine Clayton, female    DOB: May 09, 1951, 62 y.o.   MRN: [id number]  HPI  Ms. Jasmine Clayton is a 62 yr old female who presents today for follow up of HTN. She notes rare ankle swelling.  Attributes to being on her feet a lot. Denies CP/SOB.   Review of Systems    see HPI  Past Medical History  Diagnosis Date  . DVT (deep vein thrombosis) in pregnancy 1979    during pregnancy. lower extremity  . Stress incontinence, female     not current  . Morbid obesity   . 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 History of chemotherapy      cycle 5 Folfox completed 09/23/11  . History of radiation therapy 10/25/11 -12/01/11    pelvis/presacral region  . 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 Degenerative joint disease     right knee  . Corneal ulcers and infections 07/04/12    History   Social History  . Marital Status: Married    Spouse Name: N/A    Number of Children: 1  . Years of Education: N/A   Occupational History  . 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 Smoking status: Never Smoker   . Smokeless tobacco: Never Used  . Alcohol Use: 0.0 oz/week     Comment: occasional  . Drug Use: No  . Sexual Activity: Not on file   Other Topics Concern  . Not on file   Social History Narrative   Regular exercise:  No   Caffeine Use:  1 cup coffee daily   Married- lives with husband.  Daughter, son-in-law and her 2 children- they recently moved out   Completed the 12th grade, and some college.   Does not work.    Past Surgical History  Procedure Laterality Date  . Total abdominal hysterectomy w/ bilateral salpingoophorectomy  11/30/09    BSO  . Total hip arthroplasty  2002    left hip  . Mass excision  2011    "mass removed from ovaries"  . Cesarean section    . Keloid excision  1982  . Joint replacement  2002    hip replacement  .  Colostomy  05/27/11  . Portacath placement  07/26/11    right portacath  . Abdominal hysterectomy  3 years ago  . Colon surgery      LAR, reoperation for necrotic ileostomy, transverse colostomy  . Breast cyst excision  1978  . Colostomy takedown  07/11/2012    Procedure: COLOSTOMY TAKEDOWN;  Surgeon: Emelia Loron, MD;  Location: [facility];  Service: General;  Laterality: N/A;  colostomy takedown  . Port-a-cath removal  07/11/2012    Procedure: REMOVAL PORT-A-CATH;  Surgeon: Emelia Loron, MD;  Location: [facility];  Service: General;  Laterality: N/A;  removal portacath    Family History  Problem Relation Age of Onset  . Cancer Mother 69    endometrial cancer  . Diabetes Father   . Colon cancer Sister 38  . Cancer Sister 71    colon cancer currently undergoing chemotherapy  . Stomach cancer Neg Hx     No Known Allergies  Current Outpatient Prescriptions on File Prior to Visit  Medication Sig Dispense Refill  . aspirin 81 MG tablet Take 81 mg by mouth daily.        Marland Kitchen  B Complex Vitamins (B-COMPLEX/B-12 PO) Take 1 tablet by mouth daily.       . Calcium Carb-Cholecalciferol (CALCIUM-VITAMIN D3) 600-500 MG-UNIT CAPS Take 1 tablet by mouth daily.      . hydrochlorothiazide (HYDRODIURIL) 25 MG tablet Take 1 tablet (25 mg total) by mouth daily.  30 tablet  5  . Omega-3 Fatty Acids (FISH OIL) 1200 MG CAPS Take 1 capsule by mouth daily.      Marland Kitchen PRESCRIPTION MEDICATION FlurbiproFen 10%-DICLOFENAC 10%-GABAPENTIN 10%-LIDOCAINE 5%- COMPOUNDED MEDICATION TO BE APPLIED TO KNEE 3-4 TIMES DAILY       Current Facility-Administered Medications on File Prior to Visit  Medication Dose Route Frequency Provider Last Rate Last Dose  . sodium chloride 0.9 % injection 10 mL  10 mL Intravenous PRN Ladene Artist, MD   10 mL at 05/25/12 1153    BP 134/88  Pulse 81  Temp(Src) 98 F (36.7 C) (Oral)  Resp 16  Ht 5' (1.524 m)  Wt 255 lb 14.4 oz (116.075 kg)  BMI 49.98 kg/m2  SpO2 99%  LMP  11/30/2009    Objective:   Physical Exam  Constitutional: She is oriented to person, place, and time. She appears well-developed and well-nourished. No distress.  HENT:  Head: Normocephalic and atraumatic.  Cardiovascular: Normal rate and regular rhythm.   No murmur heard. Pulmonary/Chest: Effort normal and breath sounds normal. No respiratory distress. She has no wheezes. She has no rales. She exhibits no tenderness.  Musculoskeletal: She exhibits no edema.  Neurological: She is alert and oriented to person, place, and time.  Psychiatric: She has a normal mood and affect. Her behavior is normal. Judgment and thought content normal.          Assessment & Plan:

## 2013-06-04 NOTE — Patient Instructions (Signed)
Please follow up in 4 months

## 2013-06-29 IMAGING — CT CT ABD-PELV W/ CM
2 of 5 series · 17 of 46 positions shown, 19 images · IV contrast (Omnipaque 300)
Comparison: 11/17/2010

CLINICAL DATA: Newly diagnosed colon cancer

CT ABDOMEN AND PELVIS WITH CONTRAST
TECHNIQUE: Multidetector CT imaging of the abdomen and pelvis was
performed following the standard protocol during bolus
administration of intravenous contrast.
Contrast: 100mL OMNIPAQUE IOHEXOL 300 MG/ML IV SOLN

[Series 2: abd/ pel 5mm · axial · 0.83mm/px · z∈[-410,-0]mm · 14 of 92 slices shown, 16 images]
[im 5/92  soft-tissue]
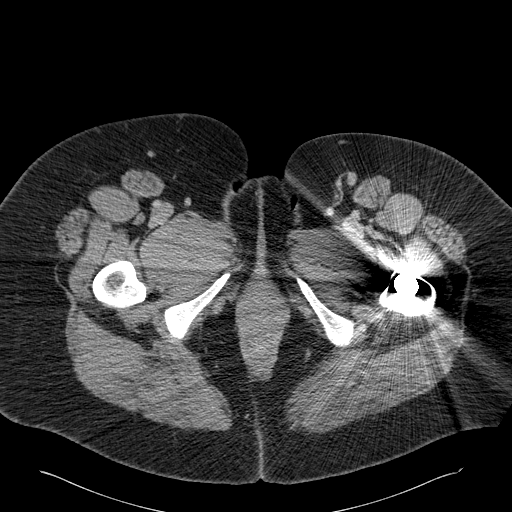
[im 5/92  bone]
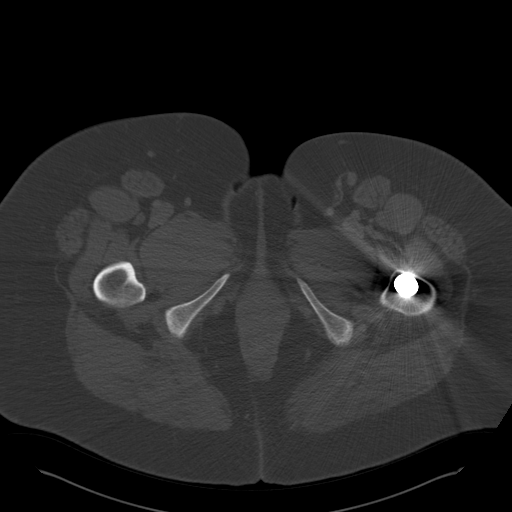
[im 10/92  soft-tissue]
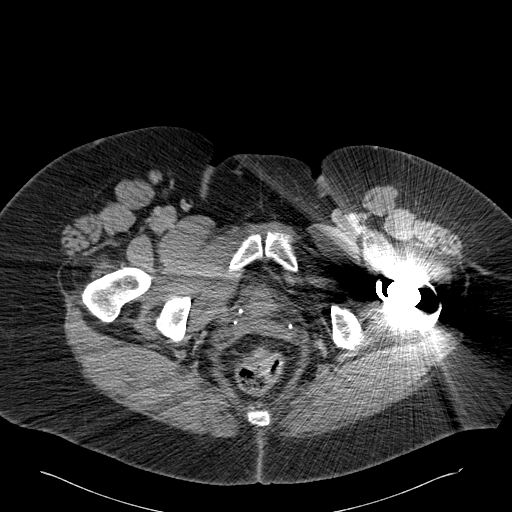
[im 20/92  soft-tissue]
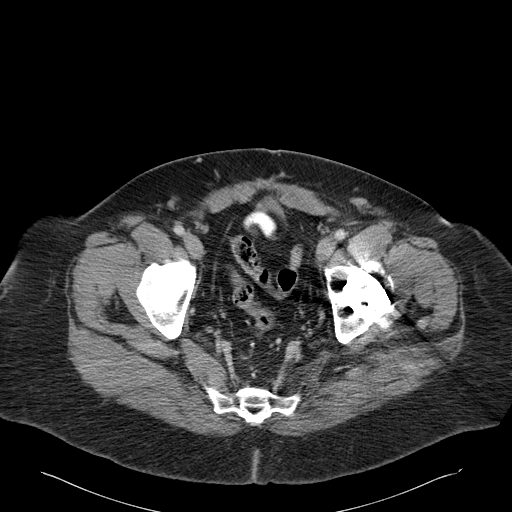
[im 24/92  soft-tissue]
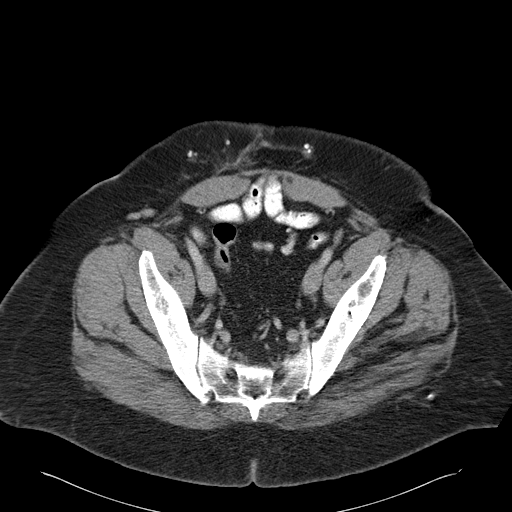
[im 29/92  soft-tissue]
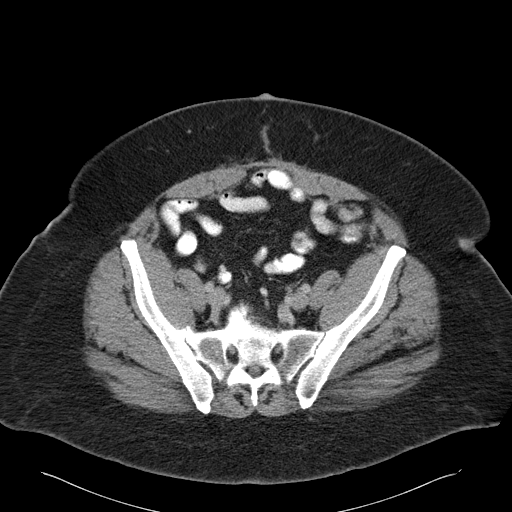
[im 39/92  soft-tissue]
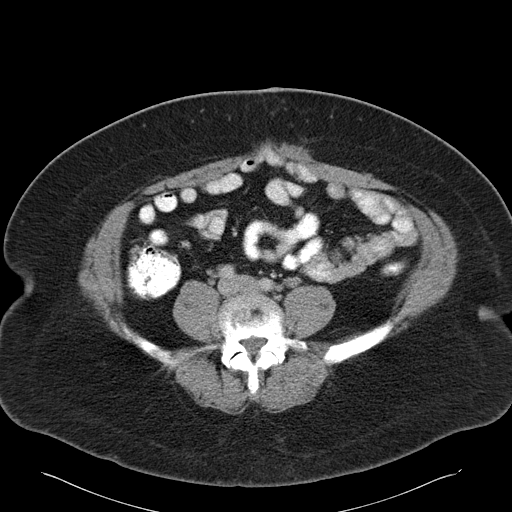
[im 44/92  soft-tissue]
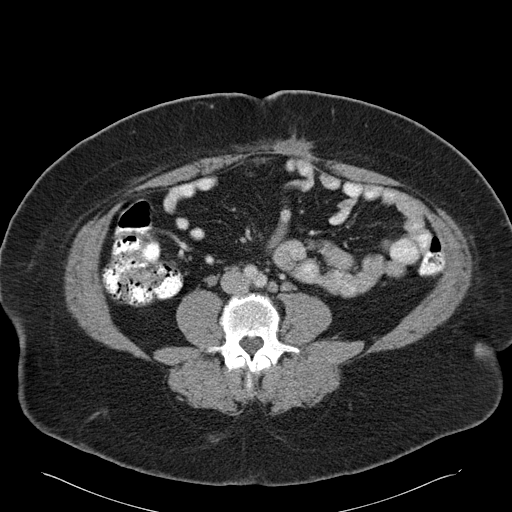
[im 48/92  soft-tissue]
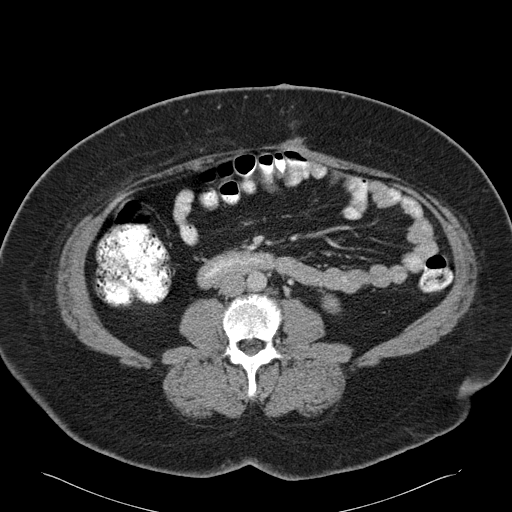
[im 53/92  soft-tissue]
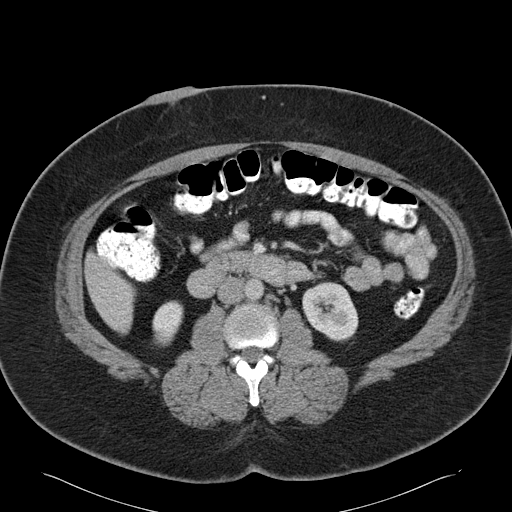
[im 53/92  bone]
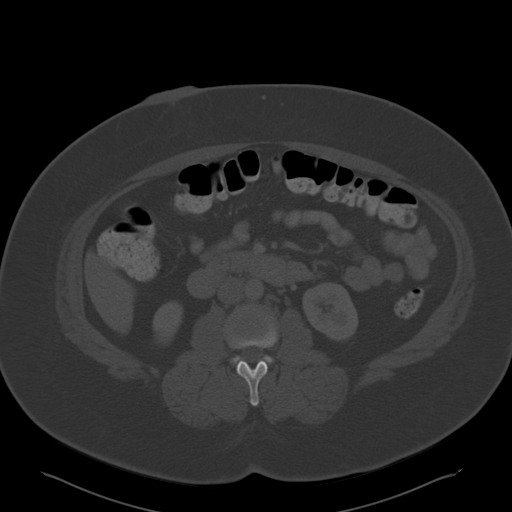
[im 63/92  soft-tissue]
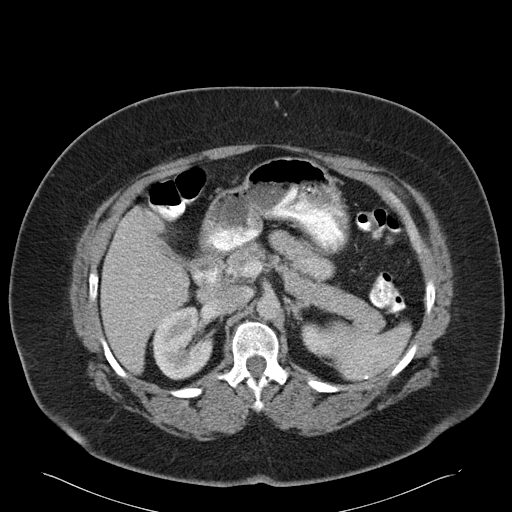
[im 68/92  soft-tissue]
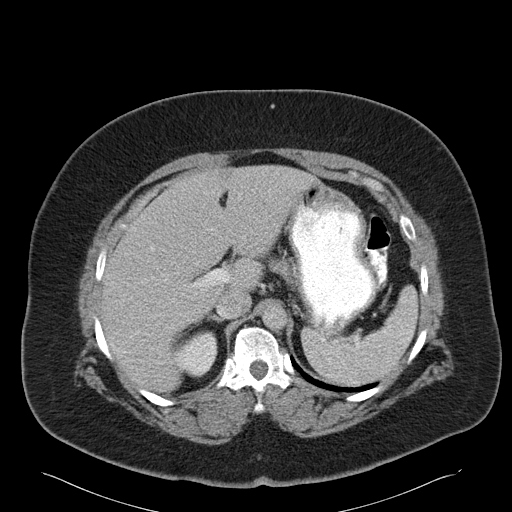
[im 72/92  soft-tissue]
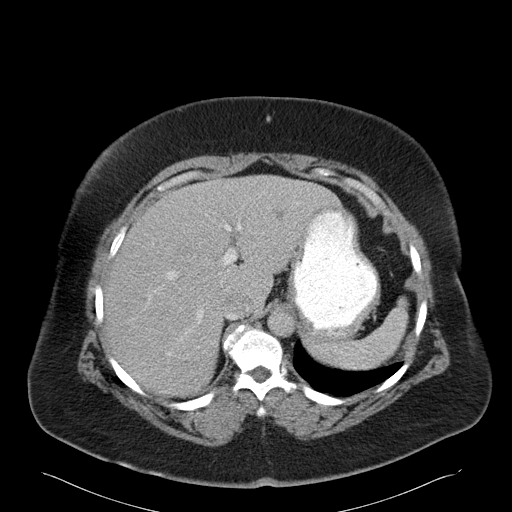
[im 82/92  soft-tissue]
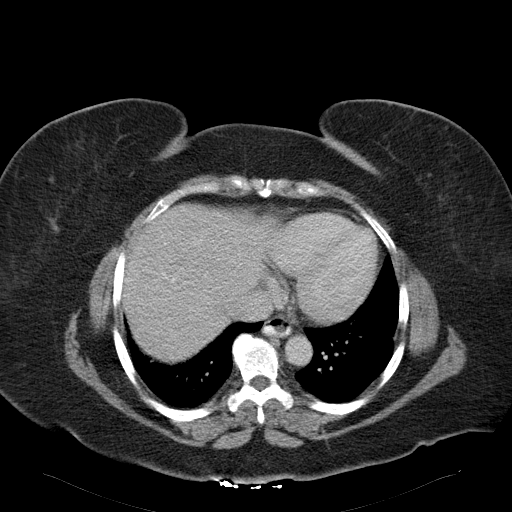
[im 87/92  soft-tissue]
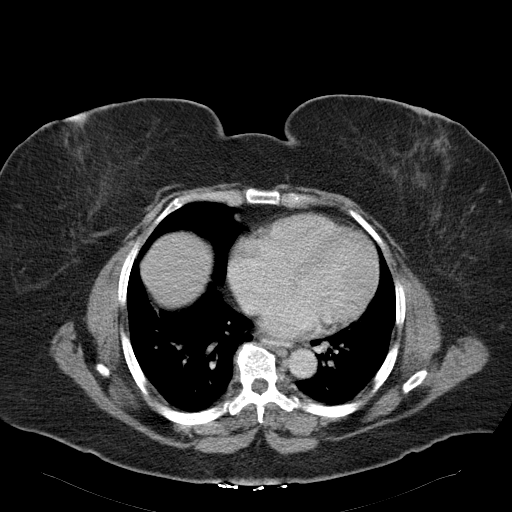

[Series 602: cor · coronal · 0.93mm/px · 3 of 124 slices shown]
[im 42/124  soft-tissue]
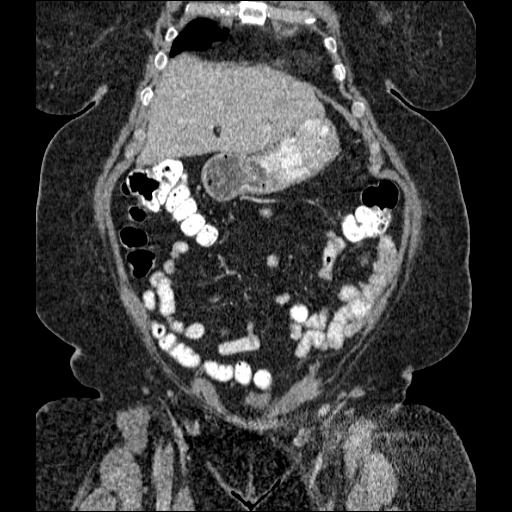
[im 55/124  soft-tissue]
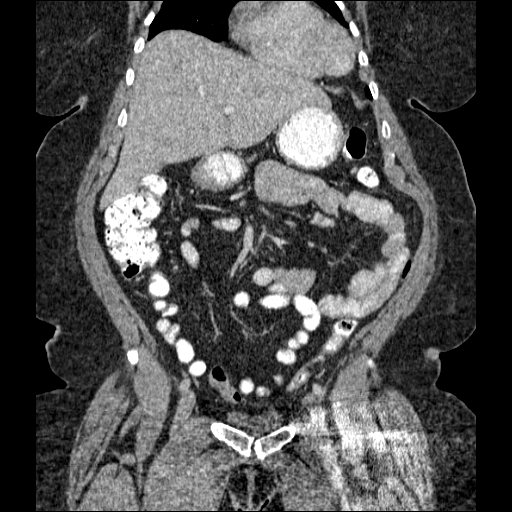
[im 69/124  soft-tissue]
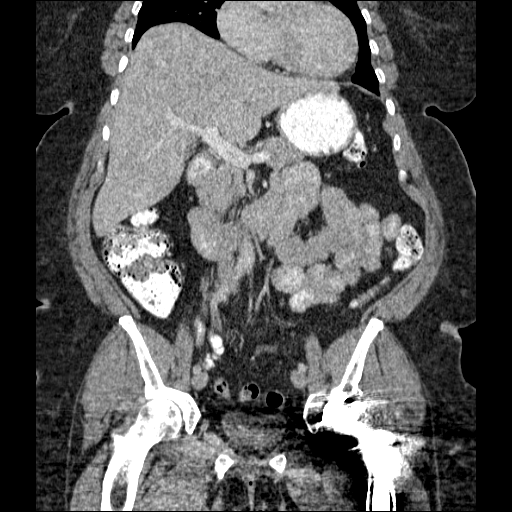

[17 of 46 positions shown; findings below may reference images not displayed]

FINDINGS: Mild bibasilar atelectasis / scarring.

Two stable left hepatic lobe cysts (series 2/images 14 and 22).  No
suspicious hepatic lesions.

Spleen, pancreas, and adrenal glands within normal limits.

Gallbladder is contracted.  No intrahepatic or extrahepatic ductal
dilatation.

Two tiny left anterior renal cysts (series 5/image 17).  Right
kidney is unremarkable.  No hydronephrosis.

No evidence of bowel obstruction.  No colonic wall thickening or
inflammatory changes.  The patient's recent newly diagnosed colon
cancer in the region of the rectum is not definitely visualized on
CT.

No evidence of abdominal aortic aneurysm.

No abdominopelvic ascites.

No suspicious abdominopelvic lymphadenopathy.

Postsurgical changes in the midline lower anterior abdominal wall.
Status post hysterectomy and bilateral oophorectomy.  Previously
visualized pelvic mass is surgically absent.

Stable appearance of the left hip arthroplasty. Degenerative
changes of the visualized thoracolumbar spine.  No focal osseous
lesions.
IMPRESSION: Known colon cancer in the region of the rectum is not definitely
visualized on CT.

No evidence of metastatic disease in the abdomen/pelvis.

Interval resection of previously visualized pelvic mass.

## 2013-08-18 ENCOUNTER — Encounter: Payer: Self-pay | Admitting: Endocrinology

## 2013-08-18 ENCOUNTER — Ambulatory Visit (INDEPENDENT_AMBULATORY_CARE_PROVIDER_SITE_OTHER): Admitting: Endocrinology

## 2013-08-18 VITALS — BP 128/70 | HR 92 | Temp 98.4°F | Wt 259.0 lb

## 2013-08-18 DIAGNOSIS — M545 Low back pain, unspecified: Secondary | ICD-10-CM

## 2013-08-18 MED ORDER — OXYCODONE-ACETAMINOPHEN 10-325 MG PO TABS: 1.0000 | ORAL_TABLET | ORAL | Status: DC | PRN

## 2013-08-18 NOTE — Patient Instructions (Signed)
Here is a prescription for pain medication. I hope you feel better soon.  If you don't feel better by next week, please [facility].  Also, please call if you develop a rash at the painful area.

## 2013-08-18 NOTE — Progress Notes (Signed)
Subjective:    Patient ID: Jasmine Clayton, female    DOB: [birth year], 63 y.o.   MRN: [id number]  HPI Pt states 2 days of moderate pain at the left lower back, but no assoc numbness.  She does not have h/o low-back pain.   Past Medical History  Diagnosis Date  . DVT (deep vein thrombosis) in pregnancy 1979    during pregnancy. lower extremity  . Stress incontinence, female     not current  . Morbid obesity   . 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 History of chemotherapy      cycle 5 Folfox completed 09/23/11  . History of radiation therapy 10/25/11 -12/01/11    pelvis/presacral region  . 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 Degenerative joint disease     right knee  . Corneal ulcers and infections 07/04/12    Past Surgical History  Procedure Laterality Date  . Total abdominal hysterectomy w/ bilateral salpingoophorectomy  11/30/09    BSO  . Total hip arthroplasty  2002    left hip  . Mass excision  2011    "mass removed from ovaries"  . Cesarean section    . Keloid excision  1982  . Joint replacement  2002    hip replacement  . Colostomy  05/27/11  . Portacath placement  07/26/11    right portacath  . Abdominal hysterectomy  3 years ago  . Colon surgery      LAR, reoperation for necrotic ileostomy, transverse colostomy  . Breast cyst excision  1978  . Colostomy takedown  07/11/2012    Procedure: COLOSTOMY TAKEDOWN;  Surgeon: Rolm Bookbinder, MD;  Location: [facility];  Service: General;  Laterality: N/A;  colostomy takedown  . Port-a-cath removal  07/11/2012    Procedure: REMOVAL PORT-A-CATH;  Surgeon: Rolm Bookbinder, MD;  Location: [facility];  Service: General;  Laterality: N/A;  removal portacath    History   Social History  . Marital Status: Married    Spouse Name: N/A    Number of Children: 1  . Years of Education: N/A   Occupational History  . 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 Smoking status: Never Smoker   . Smokeless tobacco: Never Used  . Alcohol Use: 0.0 oz/week     Comment: occasional  . Drug Use: No  . Sexual Activity: Not on file   Other Topics Concern  . Not on file   Social History Narrative   Regular exercise:  No   Caffeine Use:  1 cup coffee daily   Married- lives with husband.  Daughter, son-in-law and her 2 children- they recently moved out   Completed the 12th grade, and some college.   Does not work.    Current Outpatient Prescriptions on File Prior to Visit  Medication Sig Dispense Refill  . aspirin 81 MG tablet Take 81 mg by mouth daily.        . B Complex Vitamins (B-COMPLEX/B-12 PO) Take 1 tablet by mouth daily.       . Calcium Carb-Cholecalciferol (CALCIUM-VITAMIN D3) 600-500 MG-UNIT CAPS Take 1 tablet by mouth daily.      . hydrochlorothiazide (HYDRODIURIL) 25 MG tablet Take 1 tablet (25 mg total) by mouth daily.  30 tablet  5  . Omega-3 Fatty Acids (FISH OIL) 1200 MG CAPS Take 1 capsule by mouth  daily.      Marland Kitchen PRESCRIPTION MEDICATION FlurbiproFen 10%-DICLOFENAC 10%-GABAPENTIN 10%-LIDOCAINE 5%- COMPOUNDED MEDICATION TO BE APPLIED TO KNEE 3-4 TIMES DAILY       Current Facility-Administered Medications on File Prior to Visit  Medication Dose Route Frequency Provider Last Rate Last Dose  . sodium chloride 0.9 % injection 10 mL  10 mL Intravenous PRN [name], MD   10 mL at 05/25/12 1153    No Known Allergies  Family History  Problem Relation Age of Onset  . Cancer Mother 29    endometrial cancer  . Diabetes Father   . Colon cancer Sister 65  . Cancer Sister 71    colon cancer currently undergoing chemotherapy  . Stomach cancer Neg Hx     BP 128/70  Pulse 92  Temp(Src) 98.4 F (36.9 C) (Oral)  Wt 259 lb (117.482 kg)  SpO2 96%  LMP 11/30/2009  Review of Systems Denies bowel or bladder retention.  Denies rash at the lower back.      Objective:   Physical  Exam VITAL SIGNS:  See vs page.  GENERAL: no distress.  Lower back: nontender Neuro: sensation is intact to touch on the LE's.   Gait: normal, except she walks with the back flexed.     (i reviewed Ct scan report from 8/14)    Assessment & Plan:  Low-back pain, new H/o colon cancer.  Unlikely to be a recurrence, but she is at risk for zoster.   HTN: well-controlled, despite pain

## 2013-09-16 ENCOUNTER — Other Ambulatory Visit: Payer: Self-pay | Admitting: Family

## 2013-09-17 NOTE — Telephone Encounter (Signed)
Refill sent per [facility] refill protocol/SLS  

## 2013-10-01 ENCOUNTER — Encounter: Payer: Self-pay | Admitting: Family

## 2013-10-01 ENCOUNTER — Ambulatory Visit (INDEPENDENT_AMBULATORY_CARE_PROVIDER_SITE_OTHER): Admitting: Family

## 2013-10-01 VITALS — BP 130/78 | HR 78 | Temp 98.2°F | Resp 20 | Ht 60.0 in | Wt 259.0 lb

## 2013-10-01 DIAGNOSIS — I10 Essential (primary) hypertension: Secondary | ICD-10-CM

## 2013-10-01 DIAGNOSIS — C189 Malignant neoplasm of colon, unspecified: Secondary | ICD-10-CM

## 2013-10-01 LAB — BASIC METABOLIC PANEL
BUN: 13 mg/dL (ref 6–23)
CALCIUM: 9.2 mg/dL (ref 8.4–10.5)
CO2: 30 mEq/L (ref 19–32)
Chloride: 107 mEq/L (ref 96–112)
Creat: 1.19 mg/dL — ABNORMAL HIGH (ref 0.50–1.10)
GLUCOSE: 86 mg/dL (ref 70–99)
POTASSIUM: 3.8 meq/L (ref 3.5–5.3)
Sodium: 143 mEq/L (ref 135–145)

## 2013-10-01 MED ORDER — HYDROCHLOROTHIAZIDE 25 MG PO TABS: ORAL_TABLET | ORAL | Status: DC

## 2013-10-01 NOTE — Assessment & Plan Note (Signed)
BP stable on hctz, continue same, obtain bmet.  

## 2013-10-01 NOTE — Patient Instructions (Signed)
Please schedule follow up with your orthopedic doctor to discuss your knee pain. Complete lab work prior to leaving. Schedule a complete physical at the front desk- please come fasting to this appointment.

## 2013-10-01 NOTE — Progress Notes (Signed)
Subjective:    Patient ID: Jasmine Clayton, female    DOB: [birth year], 63 y.o.   MRN: [id number]  HPI  Jasmine Clayton is a 63 yr old female who presents today for follow up of her HTN.  She is currently maintained on HCTZ. Denies CP or SOB. She does report bilateral ankle edema which is mild and at baseline.   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 Benay Spice in 2013 and is currently in remission, undergoing active surveillance every 6 months per oncology.     Review of Systems See HPI  Past Medical History  Diagnosis Date  . DVT (deep vein thrombosis) in pregnancy 1979    during pregnancy. lower extremity  . Stress incontinence, female     not current  . Morbid obesity   . 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 History of chemotherapy      cycle 5 Folfox completed 09/23/11  . History of radiation therapy 10/25/11 -12/01/11    pelvis/presacral region  . 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 Degenerative joint disease     right knee  . Corneal ulcers and infections 07/04/12    History   Social History  . Marital Status: Married    Spouse Name: N/A    Number of Children: 1  . Years of Education: N/A   Occupational History  . 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 Smoking status: Never Smoker   . Smokeless tobacco: Never Used  . Alcohol Use: 0.0 oz/week     Comment: occasional  . Drug Use: No  . Sexual Activity: Not on file   Other Topics Concern  . Not on file   Social History Narrative   Regular exercise:  No   Caffeine Use:  1 cup coffee daily   Married- lives with husband.  Daughter, son-in-law and her 2 children- they  recently moved out   Completed the 12th grade, and some college.   Does not work.    Past Surgical History  Procedure Laterality Date  . Total abdominal hysterectomy w/ bilateral salpingoophorectomy  11/30/09    BSO  . Total hip arthroplasty  2002    left hip  . Mass excision  2011    "mass removed from ovaries"  . Cesarean section    . Keloid excision  1982  . Joint replacement  2002    hip replacement  . Colostomy  05/27/11  . Portacath placement  07/26/11    right portacath  . Abdominal hysterectomy  3 years ago  . Colon surgery      LAR, reoperation for necrotic ileostomy, transverse colostomy  . Breast cyst excision  1978  . Colostomy takedown  07/11/2012    Procedure: COLOSTOMY TAKEDOWN;  Surgeon: Rolm Bookbinder, MD;  Location: [facility];  Service: General;  Laterality: N/A;  colostomy takedown  . Port-a-cath removal  07/11/2012    Procedure: REMOVAL PORT-A-CATH;  Surgeon: Rolm Bookbinder, MD;  Location: [facility];  Service: General;  Laterality: N/A;  removal portacath    Family History  Problem Relation Age of Onset  . Cancer Mother 41  endometrial cancer  . Diabetes Father   . Colon cancer Sister 41  . Cancer Sister 75    colon cancer currently undergoing chemotherapy  . Stomach cancer Neg Hx     No Known Allergies  Current Outpatient Prescriptions on File Prior to Visit  Medication Sig Dispense Refill  . aspirin 81 MG tablet Take 81 mg by mouth daily.        . B Complex Vitamins (B-COMPLEX/B-12 PO) Take 1 tablet by mouth daily.       . Calcium Carb-Cholecalciferol (CALCIUM-VITAMIN D3) 600-500 MG-UNIT CAPS Take 1 tablet by mouth daily.      . hydrochlorothiazide (HYDRODIURIL) 25 MG tablet TAKE 1 TABLET BY MOUTH ONCE DAILY  90 tablet  0  . Omega-3 Fatty Acids (FISH OIL) 1200 MG CAPS Take 1 capsule by mouth daily.      Marland Kitchen PRESCRIPTION MEDICATION FlurbiproFen 10%-DICLOFENAC 10%-GABAPENTIN 10%-LIDOCAINE 5%- COMPOUNDED MEDICATION TO BE APPLIED TO KNEE 3-4 TIMES  DAILY      . oxyCODONE-acetaminophen (PERCOCET) 10-325 MG per tablet Take 1 tablet by mouth every 4 (four) hours as needed for pain.  30 tablet  0   Current Facility-Administered Medications on File Prior to Visit  Medication Dose Route Frequency Provider Last Rate Last Dose  . sodium chloride 0.9 % injection 10 mL  10 mL Intravenous PRN [name], MD   10 mL at 05/25/12 1153    BP 130/78  Pulse 78  Temp(Src) 98.2 F (36.8 C) (Oral)  Resp 20  Ht 5' (1.524 m)  Wt 259 lb (117.482 kg)  BMI 50.58 kg/m2  SpO2 95%  LMP 11/30/2009       Objective:   Physical Exam  Constitutional: She is oriented to person, place, and time. She appears well-developed and well-nourished. No distress.  HENT:  Head: Normocephalic and atraumatic.  Cardiovascular: Normal rate and regular rhythm.   No murmur heard. Pulmonary/Chest: Effort normal and breath sounds normal. No respiratory distress. She has no wheezes. She has no rales. She exhibits no tenderness.  Musculoskeletal:  1+ bilateral LE edema  Lymphadenopathy:    She has no cervical adenopathy.  Neurological: She is alert and oriented to person, place, and time.  Psychiatric: She has a normal mood and affect. Her behavior is normal. Judgment and thought content normal.          Assessment & Plan:

## 2013-10-01 NOTE — Assessment & Plan Note (Signed)
Stable, surveillance per oncology.

## 2013-10-01 NOTE — Progress Notes (Signed)
Pre visit review using our clinic review tool, if applicable. No additional management support is needed unless otherwise documented below in the visit note. 

## 2013-10-02 ENCOUNTER — Telehealth: Payer: Self-pay | Admitting: Family

## 2013-10-02 ENCOUNTER — Encounter: Payer: Self-pay | Admitting: Family

## 2013-10-02 NOTE — Telephone Encounter (Signed)
Relevant patient education assigned to patient using Emmi. ° °

## 2013-10-16 ENCOUNTER — Encounter: Payer: Self-pay | Admitting: Family

## 2013-10-16 ENCOUNTER — Encounter (INDEPENDENT_AMBULATORY_CARE_PROVIDER_SITE_OTHER): Payer: Self-pay

## 2013-10-16 ENCOUNTER — Ambulatory Visit (INDEPENDENT_AMBULATORY_CARE_PROVIDER_SITE_OTHER): Admitting: Family

## 2013-10-16 VITALS — BP 128/80 | HR 91 | Temp 98.0°F | Ht 60.5 in | Wt 254.1 lb

## 2013-10-16 DIAGNOSIS — Z0000 Encounter for general adult medical examination without abnormal findings: Secondary | ICD-10-CM

## 2013-10-16 LAB — CBC WITH DIFFERENTIAL/PLATELET
BASOS ABS: 0 10*3/uL (ref 0.0–0.1)
Basophils Relative: 0 % (ref 0–1)
EOS PCT: 2 % (ref 0–5)
Eosinophils Absolute: 0.1 10*3/uL (ref 0.0–0.7)
HEMATOCRIT: 39.1 % (ref 36.0–46.0)
Hemoglobin: 13.6 g/dL (ref 12.0–15.0)
LYMPHS ABS: 1.4 10*3/uL (ref 0.7–4.0)
LYMPHS PCT: 30 % (ref 12–46)
MCH: 30.1 pg (ref 26.0–34.0)
MCHC: 34.8 g/dL (ref 30.0–36.0)
MCV: 86.5 fL (ref 78.0–100.0)
MONO ABS: 0.4 10*3/uL (ref 0.1–1.0)
MONOS PCT: 9 % (ref 3–12)
Neutro Abs: 2.7 10*3/uL (ref 1.7–7.7)
Neutrophils Relative %: 59 % (ref 43–77)
Platelets: 209 10*3/uL (ref 150–400)
RBC: 4.52 MIL/uL (ref 3.87–5.11)
RDW: 15.1 % (ref 11.5–15.5)
WBC: 4.5 10*3/uL (ref 4.0–10.5)

## 2013-10-16 LAB — TSH: TSH: 2.083 u[IU]/mL (ref 0.350–4.500)

## 2013-10-16 LAB — LIPID PANEL
CHOL/HDL RATIO: 3.5 ratio
CHOLESTEROL: 179 mg/dL (ref 0–200)
HDL: 51 mg/dL (ref >39)
LDL Cholesterol: 107 mg/dL — ABNORMAL HIGH (ref 0–99)
Triglycerides: 104 mg/dL (ref <150)
VLDL: 21 mg/dL (ref 0–40)

## 2013-10-16 LAB — HEPATIC FUNCTION PANEL
ALBUMIN: 4.2 g/dL (ref 3.5–5.2)
ALT: 24 U/L (ref 0–35)
AST: 21 U/L (ref 0–37)
Alkaline Phosphatase: 113 U/L (ref 39–117)
BILIRUBIN TOTAL: 0.6 mg/dL (ref 0.2–1.2)
Bilirubin, Direct: 0.1 mg/dL (ref 0.0–0.3)
Indirect Bilirubin: 0.5 mg/dL (ref 0.2–1.2)
Total Protein: 6.7 g/dL (ref 6.0–8.3)

## 2013-10-16 NOTE — Progress Notes (Signed)
Pre visit review using our clinic review tool, if applicable. No additional management support is needed unless otherwise documented below in the visit note. 

## 2013-10-16 NOTE — Progress Notes (Signed)
Subjective:    Patient ID: Jasmine Clayton, female    DOB: 1951-06-11, 63 y.o.   MRN: [id number]  HPI  Patient presents today for complete physical.  Immunizations: up to date Diet: not eating healthy Wt Readings from Last 3 Encounters:  10/16/13 254 lb 1.3 oz (115.25 kg)  10/01/13 259 lb (117.482 kg)  08/18/13 259 lb (117.482 kg)  Exercise: not exercising.   Colonoscopy: up to date Dexa: up to date Pap Smear: s/p TAH/BSO follows with Dr. Janie Morning for GYN Mammogram: up to date     Review of Systems  Constitutional: Negative for unexpected weight change.  HENT: Negative for postnasal drip.   Respiratory: Negative for cough and shortness of breath.        Wakes up feeling unrested. Reports that she doses off.    Cardiovascular: Negative for chest pain.  Gastrointestinal: Negative for diarrhea and blood in stool.       Mild constipation, rare diarrhea  Genitourinary: Negative for dysuria and frequency.       Occasional bladder leakage  Musculoskeletal:       Right knee pain- being evaluated for TKA by Dr. Alfonso Ramus  Skin: Negative for rash.  Neurological: Negative for headaches.  Psychiatric/Behavioral:       Denies depression. Reports feeling cranky, poor energy.      Past Medical History  Diagnosis Date  . DVT (deep vein thrombosis) in pregnancy 1979    during pregnancy. lower extremity  . Stress incontinence, female     not current  . Morbid obesity   . 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 History of chemotherapy      cycle 5 Folfox completed 09/23/11  . History of radiation therapy 10/25/11 -12/01/11    pelvis/presacral region  . 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 Degenerative joint disease     right knee  . Corneal ulcers and infections 07/04/12    History   Social History  . Marital Status: Married    Spouse Name: N/A    Number of Children: 1  . Years of  Education: N/A   Occupational History  . 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 Smoking status: Never Smoker   . Smokeless tobacco: Never Used  . Alcohol Use: 0.0 oz/week     Comment: occasional  . Drug Use: No  . Sexual Activity: Not on file   Other Topics Concern  . Not on file   Social History Narrative   Regular exercise:  No   Caffeine Use:  1 cup coffee daily   Married- lives with husband.  Daughter, son-in-law and her 2 children- they recently moved out   Completed the 12th grade, and some college.   Does not work.    Past Surgical History  Procedure Laterality Date  . Total abdominal hysterectomy w/ bilateral salpingoophorectomy  11/30/09    BSO  . Total hip arthroplasty  2002    left hip  . Mass excision  2011    "mass removed from ovaries"  . Cesarean section    . Keloid excision  1982  . Joint replacement  2002    hip replacement  . Colostomy  05/27/11  . Portacath placement  07/26/11    right portacath  . Abdominal hysterectomy  3 years ago  . Colon surgery  LAR, reoperation for necrotic ileostomy, transverse colostomy  . Breast cyst excision  1978  . Colostomy takedown  07/11/2012    Procedure: COLOSTOMY TAKEDOWN;  Surgeon: Rolm Bookbinder, MD;  Location: [facility];  Service: General;  Laterality: N/A;  colostomy takedown  . Port-a-cath removal  07/11/2012    Procedure: REMOVAL PORT-A-CATH;  Surgeon: Rolm Bookbinder, MD;  Location: [facility];  Service: General;  Laterality: N/A;  removal portacath    Family History  Problem Relation Age of Onset  . Cancer Mother 52    endometrial cancer  . Diabetes Father   . Colon cancer Sister 2  . Cancer Sister 43    colon cancer currently undergoing chemotherapy  . Stomach cancer Neg Hx     No Known Allergies  Current Outpatient Prescriptions on File Prior to Visit  Medication Sig Dispense Refill  . aspirin 81 MG tablet Take 81 mg by mouth daily.        . B Complex  Vitamins (B-COMPLEX/B-12 PO) Take 1 tablet by mouth daily.       . Calcium Carb-Cholecalciferol (CALCIUM-VITAMIN D3) 600-500 MG-UNIT CAPS Take 1 tablet by mouth daily.      . hydrochlorothiazide (HYDRODIURIL) 25 MG tablet TAKE 1 TABLET BY MOUTH ONCE DAILY  90 tablet  1  . Omega-3 Fatty Acids (FISH OIL) 1200 MG CAPS Take 1 capsule by mouth daily.      Marland Kitchen PRESCRIPTION MEDICATION FlurbiproFen 10%-DICLOFENAC 10%-GABAPENTIN 10%-LIDOCAINE 5%- COMPOUNDED MEDICATION TO BE APPLIED TO KNEE 3-4 TIMES DAILY      . oxyCODONE-acetaminophen (PERCOCET) 10-325 MG per tablet Take 1 tablet by mouth every 4 (four) hours as needed for pain.  30 tablet  0   Current Facility-Administered Medications on File Prior to Visit  Medication Dose Route Frequency Provider Last Rate Last Dose  . sodium chloride 0.9 % injection 10 mL  10 mL Intravenous PRN [name], MD   10 mL at 05/25/12 1153    BP 128/80  Pulse 91  Temp(Src) 98 F (36.7 C) (Oral)  Ht 5' 0.5" (1.537 m)  Wt 254 lb 1.3 oz (115.25 kg)  BMI 48.79 kg/m2  SpO2 94%  LMP 11/30/2009       Objective:   Physical Exam  Constitutional: She is oriented to person, place, and time. She appears well-developed and well-nourished. No distress.  HENT:  Head: Normocephalic.  Cardiovascular: Normal rate and regular rhythm.   No murmur heard. Pulmonary/Chest: Effort normal and breath sounds normal. No respiratory distress. She has no wheezes. She has no rales. She exhibits no tenderness.  Abdominal: Soft. Bowel sounds are normal. She exhibits no distension and no mass. There is no tenderness. There is no rebound and no guarding.  Musculoskeletal: She exhibits no edema.  Lymphadenopathy:    She has no cervical adenopathy.  Neurological: She is alert and oriented to person, place, and time.  Skin: Skin is warm and dry.  Psychiatric: She has a normal mood and affect. Her behavior is normal. Judgment and thought content normal.  Breast: bilateral breast exam is  normal without masses noted        Assessment & Plan:

## 2013-10-16 NOTE — Assessment & Plan Note (Addendum)
We discussed healthy diet, exercise weight loss. Immunizations up to date.  Mammo, dexa, colo up to date.  We discussed sleep study due to obesity and fatigue, she declines at this time.

## 2013-10-16 NOTE — Patient Instructions (Addendum)
Let me know if you decide to proceed with sleep study.  Complete lab work prior to leaving. Work on [employer], exercise, weight loss. Follow up in 3 months.

## 2013-10-17 ENCOUNTER — Encounter: Payer: Self-pay | Admitting: Family

## 2013-10-17 LAB — URINALYSIS, ROUTINE W REFLEX MICROSCOPIC
BILIRUBIN URINE: NEGATIVE
GLUCOSE, UA: NEGATIVE mg/dL
KETONES UR: NEGATIVE mg/dL
Leukocytes, UA: NEGATIVE
Nitrite: POSITIVE — AB
PH: 5.5 (ref 5.0–8.0)
Protein, ur: NEGATIVE mg/dL
SPECIFIC GRAVITY, URINE: 1.022 (ref 1.005–1.030)
Urobilinogen, UA: 0.2 mg/dL (ref 0.0–1.0)

## 2013-10-17 LAB — URINALYSIS, MICROSCOPIC ONLY
CASTS: NONE SEEN
Squamous Epithelial / LPF: NONE SEEN

## 2013-10-26 ENCOUNTER — Telehealth: Payer: Self-pay | Admitting: Family

## 2013-10-26 MED ORDER — CIPROFLOXACIN HCL 500 MG PO TABS: 500.0000 mg | ORAL_TABLET | Freq: Two times a day (BID) | ORAL | Status: DC

## 2013-10-26 NOTE — Telephone Encounter (Signed)
Patient states that she received a letter in the mail about her lab results and she does states that she is now having frequent urination and burning and would like something called in.

## 2013-10-26 NOTE — Telephone Encounter (Signed)
Rx has been sent for cipro. If symptoms do not resolve after completion of abx, will need OV please.

## 2013-10-26 NOTE — Telephone Encounter (Signed)
Notified pt and she voices understanding. 

## 2013-11-02 ENCOUNTER — Ambulatory Visit (HOSPITAL_BASED_OUTPATIENT_CLINIC_OR_DEPARTMENT_OTHER): Admitting: Oncology

## 2013-11-02 ENCOUNTER — Other Ambulatory Visit (HOSPITAL_BASED_OUTPATIENT_CLINIC_OR_DEPARTMENT_OTHER)

## 2013-11-02 VITALS — BP 139/72 | HR 76 | Temp 98.8°F | Resp 19 | Ht 60.5 in | Wt 258.5 lb

## 2013-11-02 DIAGNOSIS — R0609 Other forms of dyspnea: Secondary | ICD-10-CM

## 2013-11-02 DIAGNOSIS — C189 Malignant neoplasm of colon, unspecified: Secondary | ICD-10-CM

## 2013-11-02 DIAGNOSIS — Z800 Family history of malignant neoplasm of digestive organs: Secondary | ICD-10-CM

## 2013-11-02 DIAGNOSIS — Z86718 Personal history of other venous thrombosis and embolism: Secondary | ICD-10-CM

## 2013-11-02 DIAGNOSIS — Z8049 Family history of malignant neoplasm of other genital organs: Secondary | ICD-10-CM

## 2013-11-02 DIAGNOSIS — C20 Malignant neoplasm of rectum: Secondary | ICD-10-CM

## 2013-11-02 DIAGNOSIS — R0989 Other specified symptoms and signs involving the circulatory and respiratory systems: Secondary | ICD-10-CM

## 2013-11-02 NOTE — Progress Notes (Signed)
  [facility] OFFICE PROGRESS NOTE   Diagnosis: Rectal cancer  INTERVAL HISTORY:   Jasmine Clayton returns as scheduled. She feels well. Occasional constipation. She has mild exertional dyspnea. Her chief complaint is discomfort in the right knee. She has occasional "cramps "in the right leg. She has been evaluated by orthopedics.  Objective:  Vital signs in last 24 hours:  Blood pressure 139/72, pulse 76, temperature 98.8 F (37.1 C), temperature source Oral, resp. rate 19, height 5' 0.5" (1.537 m), weight 258 lb 8 oz (117.255 kg), last menstrual period 11/30/2009.    HEENT: Neck without mass Lymphatics: No cervical, supraclavicular, axillary, or inguinal nodes Resp: Lungs clear bilaterally Cardio: Regular rate and rhythm GI: No hepatomegaly, no mass. Vascular: No thigh or lower leg edema. Mild edema surrounding the right knee.   Lab Results:  Lab Results  Component Value Date   WBC 4.5 10/16/2013   HGB 13.6 10/16/2013   HCT 39.1 10/16/2013   MCV 86.5 10/16/2013   PLT 209 10/16/2013   NEUTROABS 2.7 10/16/2013     Lab Results  Component Value Date   CEA 1.2 03/12/2013   Medications: I have reviewed the patient's current medications.  Assessment/Plan: 1. 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 She began adjuvant FOLFOX chemotherapy 07/26/2011. She completed cycle 6 on 10/07/2011. She began radiation and continuous infusion 5-fluorouracil on 10/25/2011, completed 12/01/2011. FOLFOX resumed for an additional 3 cycles beginning 12/16/2011. She completed the final cycle of chemotherapy 01/13/2012. Restaging CT's 03/08/2012-negative. Colonoscopy on 05/10/2012-negative.       Restaging CTs 03/12/2013 negative. 2. 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 3. Open abdominal wound. The wound has  healed. 4. History of a DVT while pregnant in 1979. 5. Normal preoperative CEA (1.0 on 05/14/2011). Normal CEA (0.6) on 01/13/2012. CEA 1.2 on 05/25/2012. CEA 1.4 on 07/24/2012. CEA 1.2 on 03/12/2013. 6. Family history of cancer including endometrial cancer in her mother and colon cancer in her sister. The tumor was submitted for microsatellite instability testing. The tumor showed no evidence of microsatellite instability by PCR. 7. Microcytic anemia. Ferritin was normal at 78 on 09/23/2011.  8. History of thrombocytopenia secondary to chemotherapy. The platelet count was in normal range on 10/16/2013 9. Right knee discomfort. She is followed by orthopedics. 10. Status post colostomy reversal and Port-A-Cath removal 07/11/2012.   Disposition:  Jasmine Clayton remains in clinical remission from rectal cancer. We will followup on the CEA from today. She will be scheduled for a restaging CT evaluation, CEA, and office visit in approximately 5 months. She will followup with orthopedics for the right leg discomfort. I encouraged her to see Dr. Inda Castle for evaluation of the exertional dyspnea.  Betsy Coder, MD  11/02/2013  3:42 PM

## 2013-11-03 LAB — CEA: CEA: 1 ng/mL (ref 0.0–5.0)

## 2013-11-05 ENCOUNTER — Telehealth: Payer: Self-pay | Admitting: Oncology

## 2013-11-05 ENCOUNTER — Other Ambulatory Visit: Payer: Self-pay | Admitting: Family

## 2013-11-05 DIAGNOSIS — N6320 Unspecified lump in the left breast, unspecified quadrant: Secondary | ICD-10-CM

## 2013-11-05 NOTE — Telephone Encounter (Signed)
s/w pt re appts for sept

## 2013-11-14 ENCOUNTER — Telehealth: Payer: Self-pay | Admitting: *Deleted

## 2013-11-14 ENCOUNTER — Encounter

## 2013-11-14 NOTE — Telephone Encounter (Signed)
Message copied by Brien Few on Wed Nov 14, 2013  1:22 PM ------      Message from: [name]      Created: Tue Nov 06, 2013  9:16 PM       Please call patient, cea is normal ------

## 2013-11-14 NOTE — Telephone Encounter (Signed)
Pt returned call, CEA results given. Normal, per Dr. Benay Spice. She voiced understanding, appreciation for call.

## 2013-11-19 ENCOUNTER — Ambulatory Visit
Admission: RE | Admit: 2013-11-19 | Discharge: 2013-11-19 | Disposition: A | Discharge status: Home / Self Care | Source: Ambulatory Visit | Attending: Family | Admitting: Family

## 2013-11-19 DIAGNOSIS — N6320 Unspecified lump in the left breast, unspecified quadrant: Secondary | ICD-10-CM

## 2014-01-07 ENCOUNTER — Encounter: Payer: Self-pay | Admitting: Family

## 2014-01-07 ENCOUNTER — Ambulatory Visit (INDEPENDENT_AMBULATORY_CARE_PROVIDER_SITE_OTHER): Admitting: Family

## 2014-01-07 VITALS — BP 130/76 | HR 89 | Temp 98.2°F | Resp 16 | Ht 60.5 in | Wt 258.1 lb

## 2014-01-07 DIAGNOSIS — R739 Hyperglycemia, unspecified: Secondary | ICD-10-CM

## 2014-01-07 DIAGNOSIS — I10 Essential (primary) hypertension: Secondary | ICD-10-CM

## 2014-01-07 DIAGNOSIS — R7309 Other abnormal glucose: Secondary | ICD-10-CM

## 2014-01-07 LAB — BASIC METABOLIC PANEL
BUN: 17 mg/dL (ref 6–23)
CALCIUM: 9.5 mg/dL (ref 8.4–10.5)
CHLORIDE: 103 meq/L (ref 96–112)
CO2: 27 meq/L (ref 19–32)
CREATININE: 1.04 mg/dL (ref 0.50–1.10)
GLUCOSE: 83 mg/dL (ref 70–99)
Potassium: 3.6 mEq/L (ref 3.5–5.3)
Sodium: 139 mEq/L (ref 135–145)

## 2014-01-07 LAB — HEMOGLOBIN A1C
Hgb A1c MFr Bld: 5.8 % — ABNORMAL HIGH (ref <5.7)
Mean Plasma Glucose: 120 mg/dL — ABNORMAL HIGH (ref <117)

## 2014-01-07 NOTE — Progress Notes (Signed)
Subjective:    Patient ID: Jasmine Clayton, female    DOB: October 07, 1950, 63 y.o.   MRN: [id number]  HPI Jasmine Clayton is a 63 year old female here for fu to her visit in March 2015. Not exercising and not following a diet.  HTN- she is maintained on hctz.   BP Readings from Last 3 Encounters:  01/07/14 130/76  11/02/13 139/72  10/16/13 128/80   Obesity- Not exercising and not watching diet.  Wt Readings from Last 3 Encounters:  01/07/14 258 lb 1.3 oz (117.064 kg)  11/02/13 258 lb 8 oz (117.255 kg)  10/16/13 254 lb 1.3 oz (115.25 kg)   Review of Systems  Constitutional: Positive for fatigue. Negative for fever.       Has occasional hot flashes.  Respiratory: Positive for shortness of breath (with exertion.). Negative for cough.   Cardiovascular: Negative for chest pain, palpitations and leg swelling.       Occasional swelling in both ankles.  Gastrointestinal: Positive for diarrhea (occasional).  Endocrine: Negative for polydipsia, polyphagia and polyuria.  Musculoskeletal: Positive for arthralgias.  Psychiatric/Behavioral:       Denies depression or anxety.   Past Medical History  Diagnosis Date  . DVT (deep vein thrombosis) in pregnancy 1979    during pregnancy. lower extremity  . Stress incontinence, female     not current  . Morbid obesity   . 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 History of chemotherapy      cycle 5 Folfox completed 09/23/11  . History of radiation therapy 10/25/11 -12/01/11    pelvis/presacral region  . 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 Degenerative joint disease     right knee  . Corneal ulcers and infections 07/04/12    History   Social History  . Marital Status: Married    Spouse Name: N/A    Number of Children: 1  . Years of Education: N/A   Occupational History  . 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 Smoking  status: Never Smoker   . Smokeless tobacco: Never Used  . Alcohol Use: 0.0 oz/week     Comment: occasional  . Drug Use: No  . Sexual Activity: Not on file   Other Topics Concern  . Not on file   Social History Narrative   Regular exercise:  No   Caffeine Use:  1 cup coffee daily   Married- lives with husband.  Daughter, son-in-law and her 2 children- they recently moved out   Completed the 12th grade, and some college.   Does not work.    Past Surgical History  Procedure Laterality Date  . Total abdominal hysterectomy w/ bilateral salpingoophorectomy  11/30/09    BSO  . Total hip arthroplasty  2002    left hip  . Mass excision  2011    "mass removed from ovaries"  . Cesarean section    . Keloid excision  1982  . Joint replacement  2002    hip replacement  . Colostomy  05/27/11  . Portacath placement  07/26/11    right portacath  . Abdominal hysterectomy  3 years ago  . Colon surgery      LAR, reoperation for necrotic ileostomy, transverse colostomy  . Breast cyst excision  1978  . Colostomy takedown  07/11/2012    Procedure: COLOSTOMY TAKEDOWN;  Surgeon: Rodman Key  Donne Hazel, MD;  Location: [facility];  Service: General;  Laterality: N/A;  colostomy takedown  . Port-a-cath removal  07/11/2012    Procedure: REMOVAL PORT-A-CATH;  Surgeon: Rolm Bookbinder, MD;  Location: [facility];  Service: General;  Laterality: N/A;  removal portacath    Family History  Problem Relation Age of Onset  . Cancer Mother 59    endometrial cancer  . Diabetes Father   . Colon cancer Sister 21  . Cancer Sister 53    colon cancer currently undergoing chemotherapy  . Stomach cancer Neg Hx     No Known Allergies  Current Outpatient Prescriptions on File Prior to Visit  Medication Sig Dispense Refill  . aspirin 81 MG tablet Take 81 mg by mouth daily.        . B Complex Vitamins (B-COMPLEX/B-12 PO) Take 1 tablet by mouth daily.       . Calcium Carb-Cholecalciferol (CALCIUM-VITAMIN D3) 600-500  MG-UNIT CAPS Take 1 tablet by mouth daily.      . hydrochlorothiazide (HYDRODIURIL) 25 MG tablet TAKE 1 TABLET BY MOUTH ONCE DAILY  90 tablet  1  . Omega-3 Fatty Acids (FISH OIL) 1200 MG CAPS Take 1 capsule by mouth daily.      Marland Kitchen PRESCRIPTION MEDICATION FlurbiproFen 10%-DICLOFENAC 10%-GABAPENTIN 10%-LIDOCAINE 5%- COMPOUNDED MEDICATION TO BE APPLIED TO KNEE 3-4 TIMES DAILY       Current Facility-Administered Medications on File Prior to Visit  Medication Dose Route Frequency Provider Last Rate Last Dose  . sodium chloride 0.9 % injection 10 mL  10 mL Intravenous PRN [name], MD   10 mL at 05/25/12 1153    BP 130/76  Pulse 89  Temp(Src) 98.2 F (36.8 C) (Oral)  Resp 16  Ht 5' 0.5" (1.537 m)  Wt 258 lb 1.3 oz (117.064 kg)  BMI 49.55 kg/m2  SpO2 97%  LMP 11/30/2009        Objective:   Physical Exam  Constitutional: She is oriented to person, place, and time. No distress.  Obese  HENT:  Head: Normocephalic and atraumatic.  Eyes: No scleral icterus.  Cardiovascular: Normal rate, regular rhythm, normal heart sounds and intact distal pulses.   No murmur heard. Pulmonary/Chest: Effort normal and breath sounds normal. No respiratory distress. She has no wheezes. She has no rales.  Lymphadenopathy:    She has no cervical adenopathy.  Neurological: She is alert and oriented to person, place, and time.  Skin: Skin is warm and dry.  Psychiatric: She has a normal mood and affect. Her behavior is normal. Thought content normal.          Assessment & Plan:  Patient seen by [facility] NP-student.  I have personally seen and examined patient and agree with Ms. Whitmire's assessment and plan.

## 2014-01-07 NOTE — Assessment & Plan Note (Signed)
Reviewed notes and pt has had several elevated blood sugars in past.Check A1C and BMET today. Follow up in six months.

## 2014-01-07 NOTE — Assessment & Plan Note (Signed)
BP stable.  Continue HCTZ. 

## 2014-01-07 NOTE — Patient Instructions (Signed)
Please complete lab work prior to leaving. Work on [employer], exercise, weight loss.  Please schedule a follow up appointment in 6 months.

## 2014-01-07 NOTE — Progress Notes (Signed)
Pre visit review using our clinic review tool, if applicable. No additional management support is needed unless otherwise documented below in the visit note. 

## 2014-01-07 NOTE — Assessment & Plan Note (Addendum)
Pt states she snacks often. Discussed healthy alternatives for snacks. Also discussed possibilities for exercise with consideration of her knee OA such as elliptical machine and stationary bike. Discussed counting calories using my fitness pal.

## 2014-01-08 ENCOUNTER — Telehealth: Payer: Self-pay | Admitting: Family

## 2014-01-08 ENCOUNTER — Encounter: Payer: Self-pay | Admitting: Family

## 2014-01-08 NOTE — Telephone Encounter (Signed)
Opened in error

## 2014-04-03 ENCOUNTER — Ambulatory Visit (HOSPITAL_COMMUNITY)
Admission: RE | Admit: 2014-04-03 | Discharge: 2014-04-03 | Disposition: A | Discharge status: Home / Self Care | Source: Ambulatory Visit | Attending: Oncology | Admitting: Oncology

## 2014-04-03 ENCOUNTER — Other Ambulatory Visit (HOSPITAL_BASED_OUTPATIENT_CLINIC_OR_DEPARTMENT_OTHER)

## 2014-04-03 ENCOUNTER — Encounter (HOSPITAL_COMMUNITY): Payer: Self-pay

## 2014-04-03 DIAGNOSIS — Z923 Personal history of irradiation: Secondary | ICD-10-CM | POA: Insufficient documentation

## 2014-04-03 DIAGNOSIS — Z9049 Acquired absence of other specified parts of digestive tract: Secondary | ICD-10-CM | POA: Diagnosis not present

## 2014-04-03 DIAGNOSIS — Z9221 Personal history of antineoplastic chemotherapy: Secondary | ICD-10-CM | POA: Insufficient documentation

## 2014-04-03 DIAGNOSIS — C189 Malignant neoplasm of colon, unspecified: Secondary | ICD-10-CM

## 2014-04-03 DIAGNOSIS — C20 Malignant neoplasm of rectum: Secondary | ICD-10-CM

## 2014-04-03 LAB — BASIC METABOLIC PANEL (CC13)
Anion Gap: 11 mEq/L (ref 3–11)
BUN: 17.7 mg/dL (ref 7.0–26.0)
CALCIUM: 9.8 mg/dL (ref 8.4–10.4)
CO2: 29 mEq/L (ref 22–29)
CREATININE: 1.1 mg/dL (ref 0.6–1.1)
Chloride: 103 mEq/L (ref 98–109)
Glucose: 95 mg/dl (ref 70–140)
Potassium: 3.5 mEq/L (ref 3.5–5.1)
Sodium: 142 mEq/L (ref 136–145)

## 2014-04-03 LAB — CEA: CEA: 1 ng/mL (ref 0.0–5.0)

## 2014-04-03 MED ORDER — IOHEXOL 300 MG/ML  SOLN
100.0000 mL | Freq: Once | INTRAMUSCULAR | Status: AC | PRN
  Administered 2014-04-03: 100 mL via INTRAVENOUS

## 2014-04-04 ENCOUNTER — Telehealth: Payer: Self-pay | Admitting: *Deleted

## 2014-04-04 NOTE — Telephone Encounter (Signed)
Called pt with CT results. Negative, per Dr. Benay Spice. 9/4 appt confirmed.

## 2014-04-04 NOTE — Telephone Encounter (Signed)
Message copied by Brien Few on Thu Apr 04, 2014  1:08 PM ------      Message from: Jasmine Clayton      Created: Thu Apr 04, 2014  9:38 AM       Please call patient, CTs are negative ------

## 2014-04-05 ENCOUNTER — Telehealth: Payer: Self-pay | Admitting: Nurse Practitioner

## 2014-04-05 ENCOUNTER — Ambulatory Visit (HOSPITAL_BASED_OUTPATIENT_CLINIC_OR_DEPARTMENT_OTHER): Admitting: Nurse Practitioner

## 2014-04-05 VITALS — BP 145/73 | HR 80 | Temp 97.2°F | Resp 20 | Ht 60.0 in | Wt 254.4 lb

## 2014-04-05 DIAGNOSIS — Z23 Encounter for immunization: Secondary | ICD-10-CM

## 2014-04-05 DIAGNOSIS — Z86718 Personal history of other venous thrombosis and embolism: Secondary | ICD-10-CM

## 2014-04-05 DIAGNOSIS — C20 Malignant neoplasm of rectum: Secondary | ICD-10-CM

## 2014-04-05 MED ORDER — INFLUENZA VAC SPLIT QUAD 0.5 ML IM SUSY
0.5000 mL | PREFILLED_SYRINGE | Freq: Once | INTRAMUSCULAR | Status: AC
  Administered 2014-04-05: 0.5 mL via INTRAMUSCULAR
  Filled 2014-04-05: qty 0.5

## 2014-04-05 NOTE — Progress Notes (Signed)
  Jasmine OFFICE PROGRESS NOTE   Diagnosis:  Rectal cancer.  INTERVAL HISTORY:   Jasmine Clayton returns as scheduled. She overall feels well. No change in bowel habits. No bloody or black stools. No significant abdominal pain. She has very occasional pain at the lower abdomen. No nausea or vomiting. Stable mild dyspnea on exertion. No fever or cough. She continues to note discomfort at the left knee.  Objective:  Vital signs in last 24 hours:  Blood pressure 145/73, pulse 80, temperature 97.2 F (36.2 C), temperature source Oral, resp. rate 20, height 5' (1.524 m), weight 254 lb 6.4 oz (115.395 kg), last menstrual period 11/30/2009, SpO2 99.00%.    HEENT: No thrush or ulcers. Lymphatics: No palpable cervical, supraclavicular, axillary or inguinal lymph nodes. Resp: Lungs clear bilaterally. Cardio:  Regular rate and rhythm. GI: Abdomen soft and nontender. No hepatomegaly. No mass. Vascular: Trace lower leg edema bilaterally.   Lab Results:  Lab Results  Component Value Date   WBC 4.5 10/16/2013   HGB 13.6 10/16/2013   HCT 39.1 10/16/2013   MCV 86.5 10/16/2013   PLT 209 10/16/2013   NEUTROABS 2.7 10/16/2013    Imaging:  No results found.  Medications: I have reviewed the patient's current medications.  Assessment/Plan: 1. 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 She began adjuvant FOLFOX chemotherapy 07/26/2011. She completed cycle 6 on 10/07/2011. She began radiation and continuous infusion 5-fluorouracil on 10/25/2011, completed 12/01/2011. FOLFOX resumed for an additional 3 cycles beginning 12/16/2011. She completed the final cycle of chemotherapy 01/13/2012. Restaging CT's 03/08/2012-negative. Colonoscopy on 05/10/2012-negative.  Restaging CTs 03/12/2013 negative.   Restaging CTs 04/03/2014 negative. 2. 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 3. Open abdominal wound. The wound has healed. 4. History of a DVT while pregnant in 1979. 5. Normal preoperative CEA (1.0 on 05/14/2011). Normal CEA (0.6) on 01/13/2012. CEA 1.2 on 05/25/2012. CEA 1.4 on 07/24/2012. CEA 1.2 on 03/12/2013. 6. Family history of cancer including endometrial cancer in her mother and colon cancer in her sister. The tumor was submitted for microsatellite instability testing. The tumor showed no evidence of microsatellite instability by PCR. 7. Microcytic anemia. Ferritin was normal at 78 on 09/23/2011.  8. History of thrombocytopenia secondary to chemotherapy. The platelet count was in normal range on 10/16/2013 9. Right knee discomfort. She is followed by orthopedics. 10. Status post colostomy reversal and Port-A-Cath removal 07/11/2012.    Disposition: [name] appears well. She remains in remission from rectal cancer. She will return for a followup visit and CEA in 6 months. She will contact the office in the interim with any problems. We made a referral to Dr. Fuller Plan for a colonoscopy.    Ned Card ANP/GNP-BC   04/05/2014  1:45 PM

## 2014-04-05 NOTE — Telephone Encounter (Signed)
Pt confirmed labs/ov per 09/04 POF, gave pt AVS....KJ

## 2014-04-12 ENCOUNTER — Telehealth: Payer: Self-pay

## 2014-04-12 DIAGNOSIS — Z85038 Personal history of other malignant neoplasm of large intestine: Secondary | ICD-10-CM

## 2014-04-12 DIAGNOSIS — Z800 Family history of malignant neoplasm of digestive organs: Secondary | ICD-10-CM

## 2014-04-15 ENCOUNTER — Encounter: Payer: Self-pay | Admitting: Gastroenterology

## 2014-04-16 NOTE — Telephone Encounter (Signed)
Patient states she would prefer the Barium Enema in the place of the Colonoscopy. Told patient I will schedule the appt and call her back.

## 2014-04-16 NOTE — Telephone Encounter (Signed)
Told patient I will schedule and mail her instructions on how to prep. Pt agreed and verbalized understanding. Pt scheduled for Barium Enema 05/02/14 at 10:00am.  ________________  Barium Enema Prep  Please purchase the following items from the laxative section of your drug store 10 oz.  Magnesium Citrate Bottle 4 Bisacodyl Tablets-Enteric coated 1 Bisacodyl Suppository   Day before exam On the day before exam you will need to be on a clear liquid diet.   NO solid foods or dairy products are allowed.  You may have unlimited quantities of the following   Broth Clear jello/gelatin with no whole foods added (no red) soft drinks, gatorade, water black coffee or tea (no milk or cream) No red jello.   popsicles  1.  Starting at 12:00 noon drink an 8 oz glass water every hour.  2.  Around 5:30 pm- Drink entire bottle of Magnesium Citrate      (This should produce a bowel movement in 30 minutes to 6 hours.) 3.  At 7:30 pm take the four (4)  Bisacodyl Tablets with on (1) full 8 oz glass of water.        (Usually produces a bowel movement in 6-12 hours.) 4.  Continue drinking 1 8 oz glass of water every hour till abut 9:00 pm.    Day of exam 1.  Do Not Eat or Drink Anything 2.  If you have a colostomy do not take the suppository. 3.  2 hours before the exam, unwrap the foil wrapper from bisacodyl suppository and discard the foil.  While lying on your side with thigh elevated, insert the suppository into the rectum and gently push in as far as possible.  Retain the suppository for at least 15 minutes, if possible, before evacuating, even if the urge is strong.      Bowel evacuation usually occurs within 15 to 60 minutes.  Patients requiring assistance should have a bed pan, commode or help readily available.   Report to the radiology department at [facility] on 05/02/14 at 10:00 am. Please arrive 15 minutes early for registration.

## 2014-04-30 ENCOUNTER — Other Ambulatory Visit: Payer: Self-pay | Admitting: Family

## 2014-04-30 DIAGNOSIS — N6489 Other specified disorders of breast: Secondary | ICD-10-CM

## 2014-05-02 ENCOUNTER — Other Ambulatory Visit: Payer: Self-pay | Admitting: Gastroenterology

## 2014-05-02 ENCOUNTER — Telehealth: Payer: Self-pay

## 2014-05-02 ENCOUNTER — Ambulatory Visit (HOSPITAL_COMMUNITY)
Admission: RE | Admit: 2014-05-02 | Discharge: 2014-05-02 | Disposition: A | Discharge status: Home / Self Care | Source: Ambulatory Visit | Attending: Gastroenterology | Admitting: Gastroenterology

## 2014-05-02 DIAGNOSIS — Z85038 Personal history of other malignant neoplasm of large intestine: Secondary | ICD-10-CM | POA: Diagnosis not present

## 2014-05-02 DIAGNOSIS — Z800 Family history of malignant neoplasm of digestive organs: Secondary | ICD-10-CM | POA: Insufficient documentation

## 2014-05-02 NOTE — Telephone Encounter (Signed)
Error

## 2014-05-07 ENCOUNTER — Ambulatory Visit
Admission: RE | Admit: 2014-05-07 | Discharge: 2014-05-07 | Disposition: A | Discharge status: Home / Self Care | Source: Ambulatory Visit | Attending: Family | Admitting: Family

## 2014-05-07 DIAGNOSIS — N6489 Other specified disorders of breast: Secondary | ICD-10-CM

## 2014-05-22 ENCOUNTER — Telehealth: Payer: Self-pay | Admitting: *Deleted

## 2014-05-22 NOTE — Telephone Encounter (Signed)
Notified pt of future appointment with Dr. Skeet Latch 06/17/2014 @ 1:15pm

## 2014-05-27 NOTE — Telephone Encounter (Signed)
Patient is scheduled for colonoscopy at [facility] 07/08/14.  Appt was arranged with the patient and she is aware

## 2014-06-16 ENCOUNTER — Telehealth: Payer: Self-pay | Admitting: Gynecologic Oncology

## 2014-06-16 NOTE — Telephone Encounter (Signed)
Office Visit:  Gyn Oncology  REASON FOR VISIT: Surveillance for low malignant potential tumor.    ASSESSMENT/PLAN  1. 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 No evidence of disease.  Jasmine Clayton has been advised to follow up with Dr. Harolyn Rutherford in 6 months and GYN Oncology in 12 months.    HISTORY OF PRESENT ILLNESS: This is a 63 year old, last normal menstrual in 1998, who fell off a ladder and sustained a laceration to the right lower quadrant. Emergency room evaluation included a CT scan of the abdomen which noted the presence of 12.6 x 9.2 x 11.3 cm mass. She underwent a total abdominal hysterectomy, bilateral salpingo- oophorectomy and intragastric omentectomy on Dec 09, 2009. 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  Patient states that she had a colonoscopy to evaluate rectal bleeding and a T1 N1 colonic malignancy  She underwent a low anterior resection on 05/19/2011 and completed FOLFOX chemotherapy on 01/13/2012. Colostomy takedown occurred 07/2012. There is no evidence of recurrent low malignant potential tumor of the ovary at either of the last 2 surgical procedures.   Past Medical History  Diagnosis Date  . DVT (deep vein thrombosis) in pregnancy 1979    during pregnancy. lower extremity  . Stress incontinence, female     not current  . Morbid obesity   . 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 History of chemotherapy      cycle 5 Folfox completed 09/23/11  . History of radiation therapy 10/25/11 -12/01/11    pelvis/presacral region  . Degenerative joint disease     right knee  . Corneal ulcers and infections 07/04/12  . Adenocarcinoma of colon 04/12/2011    "rectal-colon"   Past Surgical History  Procedure Laterality Date  . Total abdominal hysterectomy w/ bilateral salpingoophorectomy   11/30/09    BSO  . Total hip arthroplasty  2002    left hip  . Mass excision  2011    "mass removed from ovaries"  . Cesarean section    . Keloid excision  1982  . Joint replacement  2002    hip replacement  . Colostomy  05/27/11  . Portacath placement  07/26/11    right portacath  . Abdominal hysterectomy  3 years ago  . Colon surgery      LAR, reoperation for necrotic ileostomy, transverse colostomy  . Breast cyst excision  1978  . Colostomy takedown  07/11/2012    Procedure: COLOSTOMY TAKEDOWN;  Surgeon: Rolm Bookbinder, MD;  Location: [facility];  Service: General;  Laterality: N/A;  colostomy takedown  . Port-a-cath removal  07/11/2012    Procedure: REMOVAL PORT-A-CATH;  Surgeon: Rolm Bookbinder, MD;  Location: [facility];  Service: General;  Laterality: N/A;  removal portacath   History   Social History  . Marital Status: Married    Spouse Name: N/A    Number of Children: 1  . Years of Education: N/A   Occupational History  . 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 Smoking status: Never Smoker   . Smokeless tobacco: Never Used  . Alcohol Use: 0.0 oz/week     Comment: occasional  . Drug Use: No  . Sexual Activity: Not on file   Other Topics Concern  . Not on file   Social History Narrative  Regular exercise:  No   Caffeine Use:  1 cup coffee daily   Married- lives with husband.  Daughter, son-in-law and her 2 children- they recently moved out   Completed the 12th grade, and some college.   Does not work.   REVIEW OF SYSTEMS: Ten-point review of systems noteworthy for no nausea or vomiting good appetite with weight gain. No shortness of breath cough or hemoptysis. No abdominal pain or nausea. No bloating no changes in caliber of stool or rectal bleeding. No urinary frequency dysuria or incontinence no vaginal bleeding or discharge otherwise 10 point review of systems is noncontributory   PHYSICAL EXAMINATION: GENERAL:  Well-developed, obese female; in no  acute distress.  BP 128/80  Pulse 70  Temp(Src) 98.2 F (36.8 C) (Oral)  Resp 18  Ht 5' 0.75" (1.543 m)  Wt 249 lb 4.8 oz (113.082 kg)  BMI 47.5 kg/m2  LMP 11/30/2009 ABDOMEN: Soft, nontender, multiple keloid formation noted at the site of laceration in the right lower quadrant and at the site of surgical excision. There is evidence of a super incisional hernia  BACK:  No CVAT LN.  No cervical supraclavicular or inguinal adenopathy PELVIC EXAMINATION: Normal external genitalia,  Healing ulcerations on the mons and clitoral area.Normal  Bartholin's, urethral and Skene's. Atrophic vagina. No masses. No discharge, no bleeding.  No nodularity in the cul-de-sac.  RECTAL EXAMINATION:Deferred. EXTREMITIES: A 1 to 2+ lower extremity edema bilaterally.

## 2014-06-17 ENCOUNTER — Ambulatory Visit: Attending: Gynecologic Oncology | Admitting: Gynecologic Oncology

## 2014-06-17 ENCOUNTER — Encounter: Payer: Self-pay | Admitting: Gynecologic Oncology

## 2014-06-17 VITALS — BP 158/72 | HR 72 | Temp 98.0°F | Resp 18 | Ht 60.75 in | Wt 252.7 lb

## 2014-06-17 DIAGNOSIS — D279 Benign neoplasm of unspecified ovary: Secondary | ICD-10-CM | POA: Diagnosis not present

## 2014-06-17 DIAGNOSIS — Z9221 Personal history of antineoplastic chemotherapy: Secondary | ICD-10-CM | POA: Diagnosis not present

## 2014-06-17 DIAGNOSIS — Z923 Personal history of irradiation: Secondary | ICD-10-CM | POA: Diagnosis not present

## 2014-06-17 DIAGNOSIS — Z9079 Acquired absence of other genital organ(s): Secondary | ICD-10-CM | POA: Diagnosis not present

## 2014-06-17 DIAGNOSIS — K432 Incisional hernia without obstruction or gangrene: Secondary | ICD-10-CM

## 2014-06-17 DIAGNOSIS — D495 Neoplasm of unspecified behavior of other genitourinary organs: Secondary | ICD-10-CM

## 2014-06-17 DIAGNOSIS — M545 Low back pain: Secondary | ICD-10-CM | POA: Diagnosis not present

## 2014-06-17 DIAGNOSIS — Z90710 Acquired absence of both cervix and uterus: Secondary | ICD-10-CM | POA: Diagnosis not present

## 2014-06-17 DIAGNOSIS — K469 Unspecified abdominal hernia without obstruction or gangrene: Secondary | ICD-10-CM | POA: Diagnosis not present

## 2014-06-17 DIAGNOSIS — M1990 Unspecified osteoarthritis, unspecified site: Secondary | ICD-10-CM | POA: Insufficient documentation

## 2014-06-17 DIAGNOSIS — Z90722 Acquired absence of ovaries, bilateral: Secondary | ICD-10-CM | POA: Insufficient documentation

## 2014-06-17 DIAGNOSIS — C569 Malignant neoplasm of unspecified ovary: Secondary | ICD-10-CM | POA: Insufficient documentation

## 2014-06-17 DIAGNOSIS — D3910 Neoplasm of uncertain behavior of unspecified ovary: Secondary | ICD-10-CM

## 2014-06-17 DIAGNOSIS — Z85038 Personal history of other malignant neoplasm of large intestine: Secondary | ICD-10-CM | POA: Diagnosis not present

## 2014-06-17 NOTE — Progress Notes (Signed)
Office Visit:  Gyn Oncology  REASON FOR VISIT: Surveillance for low malignant potential tumor.    ASSESSMENT/PLAN  1. 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 No evidence of disease.  Jasmine Clayton has been advised to follow up with  GYN Oncology in 6 months.   Hernia:  Follow-up with General Surgery WEIGHT LOSS ADVISED   HISTORY OF PRESENT ILLNESS: This is a 63 year old, last normal menstrual in 1998, who fell off a ladder and sustained a laceration to the right lower quadrant. Emergency room evaluation included a CT scan of the abdomen which noted the presence of 12.6 x 9.2 x 11.3 cm mass. She underwent a total abdominal hysterectomy, bilateral salpingo- oophorectomy and intragastric omentectomy on Dec 09, 2009. 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  Patient states that she had a colonoscopy to evaluate rectal bleeding and a T1 N1 colonic malignancy  She underwent a low anterior resection on 05/19/2011 and completed FOLFOX chemotherapy on 01/13/2012. Colostomy takedown occurred 07/2012. There is no evidence of recurrent low malignant potential tumor of the ovary at either of the last 2 surgical procedures.  Patient reports worsening hernia and low back pain.  Mammogram 04/2014 WNL   Past Medical History  Diagnosis Date  . DVT (deep vein thrombosis) in pregnancy 1979    during pregnancy. lower extremity  . Stress incontinence, female     not current  . Morbid obesity   . 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 History of chemotherapy      cycle 5 Folfox completed 09/23/11  . History of radiation therapy 10/25/11 -12/01/11    pelvis/presacral region  . Degenerative joint disease     right knee  . Corneal ulcers and infections 07/04/12  . Adenocarcinoma of colon 04/12/2011    "rectal-colon"    Past Surgical History  Procedure Laterality Date  . Total abdominal hysterectomy w/ bilateral salpingoophorectomy  11/30/09    BSO  . Total hip arthroplasty  2002    left hip  . Mass excision  2011    "mass removed from ovaries"  . Cesarean section    . Keloid excision  1982  . Joint replacement  2002    hip replacement  . Colostomy  05/27/11  . Portacath placement  07/26/11    right portacath  . Abdominal hysterectomy  3 years ago  . Colon surgery      LAR, reoperation for necrotic ileostomy, transverse colostomy  . Breast cyst excision  1978  . Colostomy takedown  07/11/2012    Procedure: COLOSTOMY TAKEDOWN;  Surgeon: Rolm Bookbinder, MD;  Location: [facility];  Service: General;  Laterality: N/A;  colostomy takedown  . Port-a-cath removal  07/11/2012    Procedure: REMOVAL PORT-A-CATH;  Surgeon: Rolm Bookbinder, MD;  Location: [facility];  Service: General;  Laterality: N/A;  removal portacath   History   Social History  . Marital Status: Married    Spouse Name: N/A    Number of Children: 1  . Years of Education: N/A   Occupational History  . 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 Smoking status: Never Smoker   . Smokeless tobacco: Never Used  . Alcohol Use: 0.0 oz/week     Comment: occasional  . Drug Use: No  . Sexual Activity: Not  on file   Other Topics Concern  . Not on file   Social History Narrative   Regular exercise:  No   Caffeine Use:  1 cup coffee daily   Married- lives with husband.  Daughter, son-in-law and her 2 children- they recently moved out   Completed the 12th grade, and some college.   Does not work.   REVIEW OF SYSTEMS: Ten-point review of systems noteworthy for no nausea or vomiting good appetite with weight gain. No shortness of breath cough or hemoptysis. Tenderness at the hernia site, reducible hernia, No nausea. No bloating no changes in caliber of stool or rectal bleeding. No urinary frequency dysuria  or incontinence no vaginal bleeding or discharge reports low back discomfort. otherwise 10 point review of systems is noncontributory   PHYSICAL EXAMINATION: GENERAL: Well-developed, obese female; in no acute distress.  BP 158/72 mmHg  Pulse 72  Temp(Src) 98 F (36.7 C)  Resp 18  Ht 5' 0.75" (1.543 m)  Wt 252 lb 11.2 oz (114.624 kg)  BMI 48.14 kg/m2  LMP 11/30/2009  LMP 11/30/2009 ABDOMEN: Soft, nontender, multiple keloid formation noted at the site of laceration in the right lower quadrant and at the site of surgical excision. There is evidence of a super incisional hernia  6cm, reducible, minimally tender BACK:  No CVAT LN.  No cervical supraclavicular or inguinal adenopathy PELVIC EXAMINATION: Normal external genitalia,  Healing ulcerations on the mons and clitoral area.Normal  Bartholin's, urethral and Skene's. No masses. No discharge, no bleeding.  No nodularity in the cul-de-sac.  RECTAL EXAMINATION: Good tone no masses EXTREMITIES: A 1 to 2+ lower extremity edema bilaterally.

## 2014-06-17 NOTE — Patient Instructions (Addendum)
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 No evidence of disease.  Jasmine Clayton has been advised to follow up with Dr. Skeet Latch in GYN Oncology in 6 months.   Wt Readings from Last 3 Encounters:  06/17/14 252 lb 11.2 oz (114.624 kg)  04/05/14 254 lb 6.4 oz (115.395 kg)  01/07/14 258 lb 1.3 oz (117.064 kg)    Happy Holidays!!  Thank you very much Jasmine Clayton for allowing me to provide care for you today.  I appreciate your confidence in choosing our Gynecologic Oncology team.  If you have any questions about your visit today please call our office and we will get back to you as soon as possible.  Please consider using the website Medlineplus.gov as an Geneticist, molecular.   Francetta Found. Arine Foley MD., PhD Gynecologic Oncology

## 2014-06-18 ENCOUNTER — Other Ambulatory Visit: Payer: Self-pay | Admitting: Family

## 2014-07-08 ENCOUNTER — Ambulatory Visit: Admitting: Family

## 2014-07-10 ENCOUNTER — Ambulatory Visit: Admitting: Family

## 2014-07-17 ENCOUNTER — Ambulatory Visit (INDEPENDENT_AMBULATORY_CARE_PROVIDER_SITE_OTHER): Admitting: Family

## 2014-07-17 ENCOUNTER — Encounter: Payer: Self-pay | Admitting: Family

## 2014-07-17 VITALS — BP 130/82 | HR 65 | Temp 97.9°F | Resp 16 | Ht 60.5 in | Wt 253.6 lb

## 2014-07-17 DIAGNOSIS — I10 Essential (primary) hypertension: Secondary | ICD-10-CM

## 2014-07-17 DIAGNOSIS — C189 Malignant neoplasm of colon, unspecified: Secondary | ICD-10-CM

## 2014-07-17 DIAGNOSIS — R739 Hyperglycemia, unspecified: Secondary | ICD-10-CM

## 2014-07-17 DIAGNOSIS — Z23 Encounter for immunization: Secondary | ICD-10-CM

## 2014-07-17 LAB — BASIC METABOLIC PANEL
BUN: 14 mg/dL (ref 6–23)
CHLORIDE: 102 meq/L (ref 96–112)
CO2: 30 meq/L (ref 19–32)
Calcium: 9.2 mg/dL (ref 8.4–10.5)
Creatinine, Ser: 0.9 mg/dL (ref 0.4–1.2)
GFR: 77.5 mL/min (ref >60.00)
GLUCOSE: 95 mg/dL (ref 70–99)
POTASSIUM: 3.7 meq/L (ref 3.5–5.1)
SODIUM: 137 meq/L (ref 135–145)

## 2014-07-17 LAB — HEMOGLOBIN A1C: HEMOGLOBIN A1C: 6 % (ref 4.6–6.5)

## 2014-07-17 NOTE — Addendum Note (Signed)
Addended by: Kelle Darting A on: 07/17/2014 09:11 AM   Modules accepted: Orders

## 2014-07-17 NOTE — Progress Notes (Signed)
Pre visit review using our clinic review tool, if applicable. No additional management support is needed unless otherwise documented below in the visit note. 

## 2014-07-17 NOTE — Assessment & Plan Note (Signed)
BP stable/improved on hctz, obtain bmet.

## 2014-07-17 NOTE — Patient Instructions (Addendum)
Please complete lab work prior to leaving.  Schedule fasting physical in 4 months.

## 2014-07-17 NOTE — Progress Notes (Signed)
Subjective:    Patient ID: Jasmine Clayton, female    DOB: 10/16/1950, 63 y.o.   MRN: [id number]  HPI  [name] is a 63 yr old female who presents today for follow up.  1) HTN- Patient is currently maintained on the following medications for blood pressure: hctz Patient reports good compliance with blood pressure medications. Patient denies chest pain, shortness of breath or swelling. Last 3 blood pressure readings in our office are as follows: BP Readings from Last 3 Encounters:  07/17/14 130/82  06/17/14 158/72  04/05/14 145/73   2) Hyperglycemia-  She is maintained on diabetic diet alone. Reports that she is inconsistent with her dietary efforts.  Lab Results  Component Value Date   HGBA1C 5.8* 01/07/2014   3) Hx adenocarcinoma of colon- continues to follow with oncology for surveillance.  She also follows with gi and was told that she needs a follow up colonoscopy in 3 years.   Review of Systems See HPI  Past Medical History  Diagnosis Date  . DVT (deep vein thrombosis) in pregnancy 1979    during pregnancy. lower extremity  . Stress incontinence, female     not current  . Morbid obesity   . 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 History of chemotherapy      cycle 5 Folfox completed 09/23/11  . History of radiation therapy 10/25/11 -12/01/11    pelvis/presacral region  . Degenerative joint disease     right knee  . Corneal ulcers and infections 07/04/12  . Adenocarcinoma of colon 04/12/2011    "rectal-colon"    History   Social History  . Marital Status: Married    Spouse Name: N/A    Number of Children: 1  . Years of Education: N/A   Occupational History  . 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 Smoking status: Never Smoker   . Smokeless tobacco: Never Used  . Alcohol Use: 0.0 oz/week     Comment: occasional  . Drug Use: No  . Sexual Activity:  Not on file   Other Topics Concern  . Not on file   Social History Narrative   Regular exercise:  No   Caffeine Use:  1 cup coffee daily   Married- lives with husband.  Daughter, son-in-law and her 2 children- they recently moved out   Completed the 12th grade, and some college.   Does not work.    Past Surgical History  Procedure Laterality Date  . Total abdominal hysterectomy w/ bilateral salpingoophorectomy  11/30/09    BSO  . Total hip arthroplasty  2002    left hip  . Mass excision  2011    "mass removed from ovaries"  . Cesarean section    . Keloid excision  1982  . Joint replacement  2002    hip replacement  . Colostomy  05/27/11  . Portacath placement  07/26/11    right portacath  . Abdominal hysterectomy  3 years ago  . Colon surgery      LAR, reoperation for necrotic ileostomy, transverse colostomy  . Breast cyst excision  1978  . Colostomy takedown  07/11/2012    Procedure: COLOSTOMY TAKEDOWN;  Surgeon: Rolm Bookbinder, MD;  Location: [facility];  Service: General;  Laterality: N/A;  colostomy takedown  . Port-a-cath removal  07/11/2012    Procedure: REMOVAL PORT-A-CATH;  Surgeon: Rolm Bookbinder,  MD;  Location: [facility];  Service: General;  Laterality: N/A;  removal portacath    Family History  Problem Relation Age of Onset  . Cancer Mother 7    endometrial cancer  . Diabetes Father   . Colon cancer Sister 25  . Cancer Sister 72    colon cancer currently undergoing chemotherapy  . Stomach cancer Neg Hx     No Known Allergies  Current Outpatient Prescriptions on File Prior to Visit  Medication Sig Dispense Refill  . aspirin 81 MG tablet Take 81 mg by mouth daily.      . B Complex Vitamins (B-COMPLEX/B-12 PO) Take 1 tablet by mouth daily.     . Calcium Carb-Cholecalciferol (CALCIUM-VITAMIN D3) 600-500 MG-UNIT CAPS Take 1 tablet by mouth daily.    . hydrochlorothiazide (HYDRODIURIL) 25 MG tablet TAKE 1 TABLET BY MOUTH DAILY 90 tablet 1  . Omega-3 Fatty  Acids (FISH OIL) 1200 MG CAPS Take 1 capsule by mouth daily.    Marland Kitchen PRESCRIPTION MEDICATION FlurbiproFen 10%-DICLOFENAC 10%-GABAPENTIN 10%-LIDOCAINE 5%- COMPOUNDED MEDICATION TO BE APPLIED TO KNEE 3-4 TIMES DAILY     Current Facility-Administered Medications on File Prior to Visit  Medication Dose Route Frequency Provider Last Rate Last Dose  . sodium chloride 0.9 % injection 10 mL  10 mL Intravenous PRN [name], MD   10 mL at 05/25/12 1153    BP 130/82 mmHg  Pulse 65  Temp(Src) 97.9 F (36.6 C) (Oral)  Resp 16  Ht 5' 0.5" (1.537 m)  Wt 253 lb 9.6 oz (115.032 kg)  BMI 48.69 kg/m2  SpO2 97%  LMP 11/30/2009       Objective:   Physical Exam  Constitutional: She is oriented to person, place, and time. She appears well-developed and well-nourished. No distress.  Cardiovascular: Normal rate and regular rhythm.   No murmur heard. Pulmonary/Chest: Effort normal and breath sounds normal. No respiratory distress. She has no wheezes. She has no rales. She exhibits no tenderness.  Neurological: She is alert and oriented to person, place, and time.  Psychiatric: She has a normal mood and affect. Her behavior is normal. Judgment and thought content normal.          Assessment & Plan:

## 2014-07-17 NOTE — Assessment & Plan Note (Signed)
Surveillance per oncology and GI.

## 2014-07-17 NOTE — Assessment & Plan Note (Signed)
Discussed healthy diet,exercise and weight loss. Obtain follow up A1C.

## 2014-10-14 ENCOUNTER — Ambulatory Visit (HOSPITAL_BASED_OUTPATIENT_CLINIC_OR_DEPARTMENT_OTHER): Admitting: Oncology

## 2014-10-14 ENCOUNTER — Telehealth: Payer: Self-pay | Admitting: Oncology

## 2014-10-14 ENCOUNTER — Other Ambulatory Visit (HOSPITAL_BASED_OUTPATIENT_CLINIC_OR_DEPARTMENT_OTHER)

## 2014-10-14 VITALS — BP 138/78 | HR 80 | Temp 98.1°F | Resp 19 | Ht 60.0 in | Wt 254.9 lb

## 2014-10-14 DIAGNOSIS — C20 Malignant neoplasm of rectum: Secondary | ICD-10-CM

## 2014-10-14 DIAGNOSIS — Z85038 Personal history of other malignant neoplasm of large intestine: Secondary | ICD-10-CM

## 2014-10-14 DIAGNOSIS — C189 Malignant neoplasm of colon, unspecified: Secondary | ICD-10-CM

## 2014-10-14 DIAGNOSIS — Z86718 Personal history of other venous thrombosis and embolism: Secondary | ICD-10-CM

## 2014-10-14 LAB — CEA: CEA: 1.2 ng/mL (ref 0.0–5.0)

## 2014-10-14 NOTE — Telephone Encounter (Signed)
Gave asv & calendar for September °

## 2014-10-14 NOTE — Progress Notes (Signed)
  [facility] OFFICE PROGRESS NOTE   Diagnosis: Colon cancer  INTERVAL HISTORY:   Ms. Fata returns as scheduled. She reports intermittent low back pain for the past year. She also has pain in the right knee. No difficulty with bowel function. She underwent a colonoscopy at [facility] on 07/08/2014. This revealed no evidence of recurrent cancer or polyps.  Objective:  Vital signs in last 24 hours:  Blood pressure 138/78, pulse 80, temperature 98.1 F (36.7 C), temperature source Oral, resp. rate 19, height 5' (1.524 m), weight 254 lb 14.4 oz (115.622 kg), last menstrual period 11/30/2009, SpO2 100 %.    HEENT: Neck without mass Lymphatics: No cervical, supra-clavicular, axillary, or inguinal nodes Resp: Lungs clear bilaterally Cardio: Regular rate and rhythm GI: No hepatomegaly, nontender, no mass Vascular: No leg edema   Lab Results  Component Value Date   CEA 1.0 04/03/2014     Medications: I have reviewed the patient's current medications.  Assessment/Plan: 1. 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 She began adjuvant FOLFOX chemotherapy 07/26/2011. She completed cycle 6 on 10/07/2011. She began radiation and continuous infusion 5-fluorouracil on 10/25/2011, completed 12/01/2011. FOLFOX resumed for an additional 3 cycles beginning 12/16/2011. She completed the final cycle of chemotherapy 01/13/2012. Restaging CT's 03/08/2012-negative. Colonoscopy on 05/10/2012-negative.  Restaging CTs 03/12/2013 negative.   Restaging CTs 04/03/2014 negative.  Negative surveillance colonoscopy 07/08/2014 2. 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 3. Open abdominal wound. The wound has healed. 4. History of a DVT while pregnant in 1979. 5. Normal preoperative CEA (1.0 on 05/14/2011). 6. Family  history of cancer including endometrial cancer in her mother and colon cancer in her sister. The tumor was submitted for microsatellite instability testing. The tumor showed no evidence of microsatellite instability by PCR. 7. Microcytic anemia. Ferritin was normal at 78 on 09/23/2011.  8. History of thrombocytopenia secondary to chemotherapy. The platelet count was in normal range on 10/16/2013 9. Right knee discomfort. She is followed by orthopedics. 10. Status post colostomy reversal and Port-A-Cath removal 07/11/2012. 11. 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 Whittingham remains in clinical remission from colon cancer. We will follow-up on the CEA from today. She will return for an office visit and CEA in 6 months. I encouraged her to follow-up with her primary physician for evaluation of the back pain.  Betsy Coder, MD  10/14/2014  3:32 PM

## 2014-10-15 ENCOUNTER — Telehealth: Payer: Self-pay | Admitting: *Deleted

## 2014-10-15 NOTE — Telephone Encounter (Signed)
-----   Message from [name], MD sent at 10/15/2014  1:52 PM EDT ----- Please call patient, Jasmine Clayton is normal

## 2014-10-15 NOTE — Telephone Encounter (Signed)
Per Dr. Sherrill; notified pt that cea is normal.  Pt verbalized understanding. 

## 2014-10-29 ENCOUNTER — Telehealth: Payer: Self-pay | Admitting: Family

## 2014-10-29 NOTE — Telephone Encounter (Signed)
Pre visit letter sent  °

## 2014-11-14 ENCOUNTER — Telehealth: Payer: Self-pay

## 2014-11-14 NOTE — Telephone Encounter (Signed)
Pre visit call/questionnaire  completed

## 2014-11-18 ENCOUNTER — Encounter: Payer: Self-pay | Admitting: Family

## 2014-11-18 ENCOUNTER — Ambulatory Visit (INDEPENDENT_AMBULATORY_CARE_PROVIDER_SITE_OTHER): Admitting: Family

## 2014-11-18 VITALS — BP 122/78 | HR 70 | Temp 98.0°F | Resp 16 | Ht 60.0 in | Wt 254.6 lb

## 2014-11-18 DIAGNOSIS — Z0000 Encounter for general adult medical examination without abnormal findings: Secondary | ICD-10-CM | POA: Diagnosis not present

## 2014-11-18 DIAGNOSIS — E785 Hyperlipidemia, unspecified: Secondary | ICD-10-CM

## 2014-11-18 LAB — LIPID PANEL
Cholesterol: 178 mg/dL (ref 0–200)
HDL: 48.3 mg/dL (ref >39.00)
LDL CALC: 110 mg/dL — AB (ref 0–99)
NonHDL: 129.7
Total CHOL/HDL Ratio: 4
Triglycerides: 100 mg/dL (ref 0.0–149.0)
VLDL: 20 mg/dL (ref 0.0–40.0)

## 2014-11-18 LAB — CBC WITH DIFFERENTIAL/PLATELET
BASOS ABS: 0 10*3/uL (ref 0.0–0.1)
Basophils Relative: 0.1 % (ref 0.0–3.0)
EOS ABS: 0.1 10*3/uL (ref 0.0–0.7)
Eosinophils Relative: 2.1 % (ref 0.0–5.0)
HEMATOCRIT: 42.2 % (ref 36.0–46.0)
Hemoglobin: 14.2 g/dL (ref 12.0–15.0)
LYMPHS ABS: 1.3 10*3/uL (ref 0.7–4.0)
Lymphocytes Relative: 25.3 % (ref 12.0–46.0)
MCHC: 33.6 g/dL (ref 30.0–36.0)
MCV: 89.6 fl (ref 78.0–100.0)
Monocytes Absolute: 0.4 10*3/uL (ref 0.1–1.0)
Monocytes Relative: 7.4 % (ref 3.0–12.0)
Neutro Abs: 3.3 10*3/uL (ref 1.4–7.7)
Neutrophils Relative %: 65.1 % (ref 43.0–77.0)
Platelets: 199 10*3/uL (ref 150.0–400.0)
RBC: 4.7 Mil/uL (ref 3.87–5.11)
RDW: 16 % — AB (ref 11.5–15.5)
WBC: 5.1 10*3/uL (ref 4.0–10.5)

## 2014-11-18 LAB — BASIC METABOLIC PANEL
BUN: 18 mg/dL (ref 6–23)
CALCIUM: 9.7 mg/dL (ref 8.4–10.5)
CO2: 30 meq/L (ref 19–32)
CREATININE: 1.03 mg/dL (ref 0.40–1.20)
Chloride: 102 mEq/L (ref 96–112)
GFR: 69.66 mL/min (ref >60.00)
GLUCOSE: 87 mg/dL (ref 70–99)
Potassium: 3.3 mEq/L — ABNORMAL LOW (ref 3.5–5.1)
Sodium: 138 mEq/L (ref 135–145)

## 2014-11-18 LAB — URINALYSIS, ROUTINE W REFLEX MICROSCOPIC
Bilirubin Urine: NEGATIVE
Ketones, ur: NEGATIVE
Leukocytes, UA: NEGATIVE
NITRITE: NEGATIVE
PH: 6 (ref 5.0–8.0)
SPECIFIC GRAVITY, URINE: 1.025 (ref 1.000–1.030)
Total Protein, Urine: NEGATIVE
URINE GLUCOSE: NEGATIVE
UROBILINOGEN UA: 0.2 (ref 0.0–1.0)

## 2014-11-18 LAB — HEPATIC FUNCTION PANEL
ALBUMIN: 3.9 g/dL (ref 3.5–5.2)
ALT: 21 U/L (ref 0–35)
AST: 19 U/L (ref 0–37)
Alkaline Phosphatase: 91 U/L (ref 39–117)
BILIRUBIN TOTAL: 0.5 mg/dL (ref 0.2–1.2)
Bilirubin, Direct: 0.1 mg/dL (ref 0.0–0.3)
Total Protein: 6.9 g/dL (ref 6.0–8.3)

## 2014-11-18 LAB — TSH: TSH: 2.65 u[IU]/mL (ref 0.35–4.50)

## 2014-11-18 LAB — HEMOGLOBIN A1C: Hgb A1c MFr Bld: 5.8 % (ref 4.6–6.5)

## 2014-11-18 NOTE — Patient Instructions (Signed)
Please complete lab work prior to leaving. Work on [employer], exercise weight loss. Please schedule a follow up appointment in 6 months.

## 2014-11-18 NOTE — Assessment & Plan Note (Signed)
Immunizations reviewed and up to date.  Continue healthy diet, exercise- discussed weight loss.  Obtain routine labs.

## 2014-11-18 NOTE — Progress Notes (Signed)
Subjective:    Patient ID: Jasmine Clayton, female    DOB: 1950/09/19, 64 y.o.   MRN: [id number]  HPI   Patient presents today for complete physical.  Immunizations: due for prevnar 12/16 Diet: reports diet is not healthy.  Wt Readings from Last 3 Encounters:  11/18/14 254 lb 9.6 oz (115.486 kg)  10/14/14 254 lb 14.4 oz (115.622 kg)  07/17/14 253 lb 9.6 oz (115.032 kg)  Exercise:  Not regular, limited by knee back pain.  Colonoscopy:12/15 Dexa:  12/15 Pap Smear: hysterectomy TAH/BSO Mammogram: up to date.        Review of Systems  Constitutional: Negative for unexpected weight change.  HENT: Negative for hearing loss and rhinorrhea.   Eyes: Negative for visual disturbance.  Respiratory: Negative for cough.   Cardiovascular: Negative for leg swelling.  Gastrointestinal: Negative for nausea and diarrhea.       Mild intermittent constipation  Genitourinary: Negative for dysuria and frequency.  Musculoskeletal: Positive for back pain and arthralgias.  Skin: Negative for rash.  Neurological: Negative for headaches.  Hematological: Negative for adenopathy.  Psychiatric/Behavioral: Negative for dysphoric mood and agitation.       Occasionally finds herself frustrated but denies depression   Past Medical History  Diagnosis Date  . DVT (deep vein thrombosis) in pregnancy 1979    during pregnancy. lower extremity  . Stress incontinence, female     not current  . Morbid obesity   . 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 History of chemotherapy      cycle 5 Folfox completed 09/23/11  . History of radiation therapy 10/25/11 -12/01/11    pelvis/presacral region  . Degenerative joint disease     right knee  . Corneal ulcers and infections 07/04/12  . Adenocarcinoma of colon 04/12/2011    "rectal-colon"    History   Social History  . Marital Status: Married    Spouse Name: N/A  . Number of Children: 1  .  Years of Education: N/A   Occupational History  . 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 Smoking status: Never Smoker   . Smokeless tobacco: Never Used  . Alcohol Use: 0.0 oz/week     Comment: occasional  . Drug Use: No  . Sexual Activity: Not on file   Other Topics Concern  . Not on file   Social History Narrative   Regular exercise:  No   Caffeine Use:  1 cup coffee daily   Married- lives with husband.  Daughter, son-in-law and her 2 children- they recently moved out   Completed the 12th grade, and some college.   Does not work.    Past Surgical History  Procedure Laterality Date  . Total abdominal hysterectomy w/ bilateral salpingoophorectomy  11/30/09    BSO  . Total hip arthroplasty  2002    left hip  . Mass excision  2011    "mass removed from ovaries"  . Cesarean section    . Keloid excision  1982  . Joint replacement  2002    hip replacement  . Colostomy  05/27/11  . Portacath placement  07/26/11    right portacath  . Abdominal hysterectomy  3 years ago  . Colon surgery      LAR, reoperation for necrotic ileostomy, transverse colostomy  . Breast cyst excision  1978  . Colostomy takedown  07/11/2012  Procedure: COLOSTOMY TAKEDOWN;  Surgeon: Rolm Bookbinder, MD;  Location: [facility];  Service: General;  Laterality: N/A;  colostomy takedown  . Port-a-cath removal  07/11/2012    Procedure: REMOVAL PORT-A-CATH;  Surgeon: Rolm Bookbinder, MD;  Location: [facility];  Service: General;  Laterality: N/A;  removal portacath    Family History  Problem Relation Age of Onset  . Cancer Mother 22    endometrial cancer  . Diabetes Father   . Colon cancer Sister 41  . Cancer Sister 49    colon cancer currently undergoing chemotherapy  . Stomach cancer Neg Hx     No Known Allergies  Current Outpatient Prescriptions on File Prior to Visit  Medication Sig Dispense Refill  . aspirin 81 MG tablet Take 81 mg by mouth daily.      . B  Complex Vitamins (B-COMPLEX/B-12 PO) Take 1 tablet by mouth daily.     . Calcium Carb-Cholecalciferol (CALCIUM-VITAMIN D3) 600-500 MG-UNIT CAPS Take 1 tablet by mouth daily.    . hydrochlorothiazide (HYDRODIURIL) 25 MG tablet TAKE 1 TABLET BY MOUTH DAILY 90 tablet 1  . Omega-3 Fatty Acids (FISH OIL) 1200 MG CAPS Take 1 capsule by mouth daily.    Marland Kitchen PRESCRIPTION MEDICATION FlurbiproFen 10%-DICLOFENAC 10%-GABAPENTIN 10%-LIDOCAINE 5%- COMPOUNDED MEDICATION TO BE APPLIED TO KNEE 3-4 TIMES DAILY     Current Facility-Administered Medications on File Prior to Visit  Medication Dose Route Frequency Provider Last Rate Last Dose  . sodium chloride 0.9 % injection 10 mL  10 mL Intravenous PRN [name], MD   10 mL at 05/25/12 1153    BP 122/78 mmHg  Pulse 70  Temp(Src) 98 F (36.7 C) (Oral)  Resp 16  Ht 5' (1.524 m)  Wt 254 lb 9.6 oz (115.486 kg)  BMI 49.72 kg/m2  SpO2 97%  LMP 11/30/2009        Objective:   Physical Exam  Physical Exam  Constitutional: Morbidly obese AA female. She is oriented to person, place, and time. She appears well-developed and well-nourished. No distress.  HENT:  Head: Normocephalic and atraumatic.  Right Ear: Tympanic membrane and ear canal normal.  Left Ear: Tympanic membrane and ear canal normal.  Mouth/Throat: Oropharynx is clear and moist.  Eyes: Pupils are equal, round, and reactive to light. No scleral icterus.  Neck: Normal range of motion. No thyromegaly present.  Cardiovascular: Normal rate and regular rhythm.   No murmur heard. Pulmonary/Chest: Effort normal and breath sounds normal. No respiratory distress. He has no wheezes. She has no rales. She exhibits no tenderness.  Abdominal: Soft. Bowel sounds are normal. He exhibits no distension and no mass. There is no tenderness. There is no rebound and no guarding.  Musculoskeletal: She exhibits no edema.  Lymphadenopathy:    She has no cervical adenopathy.  Neurological: She is alert and  oriented to person, place, and time. She has normal reflexes. She exhibits normal muscle tone. Coordination normal.  Skin: Skin is warm and dry. keloid abdominal surgical scars noted Psychiatric: She has a normal mood and affect. Her behavior is normal. Judgment and thought content normal.  Breasts: Examined lying Right: Without masses, retractions, discharge or axillary adenopathy.  Left: Without masses, retractions, discharge or axillary adenopathy.  Pelvic:  deferred       Assessment & Plan:         Assessment & Plan:

## 2014-11-19 ENCOUNTER — Telehealth: Payer: Self-pay | Admitting: Family

## 2014-11-19 DIAGNOSIS — E876 Hypokalemia: Secondary | ICD-10-CM

## 2014-11-19 DIAGNOSIS — R3129 Other microscopic hematuria: Secondary | ICD-10-CM

## 2014-11-19 LAB — HIV ANTIBODY (ROUTINE TESTING W REFLEX): HIV: NONREACTIVE

## 2014-11-19 MED ORDER — CIPROFLOXACIN HCL 250 MG PO TABS: 250.0000 mg | ORAL_TABLET | Freq: Two times a day (BID) | ORAL | Status: DC

## 2014-11-19 MED ORDER — POTASSIUM CHLORIDE CRYS ER 20 MEQ PO TBCR: 20.0000 meq | EXTENDED_RELEASE_TABLET | Freq: Every day | ORAL | Status: DC

## 2014-11-19 NOTE — Telephone Encounter (Signed)
Potassium is low, start kdur once daily, repeat bmet in 2 week dx hypokalemia. Sugar is stable, thyroid function, liver function and cholesterol look good.   Urine has some blood in it- possible UTI. Recommend cipro x 3 days, repeat UA in 2 weeks, dx hematuria.

## 2014-11-19 NOTE — Telephone Encounter (Signed)
Notified pt and she voices understanding. Scheduled lab appt for 12/03/14 at 8:45am and future lab orders entered.

## 2014-11-20 ENCOUNTER — Encounter: Payer: Self-pay | Admitting: Family

## 2014-12-03 ENCOUNTER — Other Ambulatory Visit (INDEPENDENT_AMBULATORY_CARE_PROVIDER_SITE_OTHER)

## 2014-12-03 DIAGNOSIS — R312 Other microscopic hematuria: Secondary | ICD-10-CM | POA: Diagnosis not present

## 2014-12-03 DIAGNOSIS — E876 Hypokalemia: Secondary | ICD-10-CM

## 2014-12-03 DIAGNOSIS — R3129 Other microscopic hematuria: Secondary | ICD-10-CM

## 2014-12-03 LAB — BASIC METABOLIC PANEL
BUN: 13 mg/dL (ref 6–23)
CHLORIDE: 101 meq/L (ref 96–112)
CO2: 26 meq/L (ref 19–32)
Calcium: 9.6 mg/dL (ref 8.4–10.5)
Creatinine, Ser: 1.08 mg/dL (ref 0.40–1.20)
GFR: 65.95 mL/min (ref >60.00)
GLUCOSE: 92 mg/dL (ref 70–99)
POTASSIUM: 3.6 meq/L (ref 3.5–5.1)
SODIUM: 135 meq/L (ref 135–145)

## 2014-12-03 LAB — POCT URINALYSIS DIPSTICK
Bilirubin, UA: NEGATIVE
Glucose, UA: NEGATIVE
Ketones, UA: NEGATIVE
Leukocytes, UA: NEGATIVE
NITRITE UA: NEGATIVE
Protein, UA: NEGATIVE
Spec Grav, UA: 1.015
UROBILINOGEN UA: 0.2
pH, UA: 6

## 2014-12-04 ENCOUNTER — Encounter: Payer: Self-pay | Admitting: Family

## 2014-12-04 NOTE — Addendum Note (Signed)
Addended by: Harl Bowie on: 12/04/2014 02:02 PM   Modules accepted: Orders

## 2014-12-05 LAB — URINE CULTURE

## 2014-12-06 ENCOUNTER — Encounter: Payer: Self-pay | Admitting: Family

## 2014-12-11 ENCOUNTER — Other Ambulatory Visit: Payer: Self-pay | Admitting: Family

## 2014-12-12 ENCOUNTER — Encounter: Payer: Self-pay | Admitting: Gynecologic Oncology

## 2014-12-12 ENCOUNTER — Ambulatory Visit: Attending: Gynecologic Oncology | Admitting: Gynecologic Oncology

## 2014-12-12 VITALS — BP 147/65 | HR 74 | Temp 98.0°F | Resp 18 | Ht 60.0 in | Wt 252.5 lb

## 2014-12-12 DIAGNOSIS — K432 Incisional hernia without obstruction or gangrene: Secondary | ICD-10-CM | POA: Diagnosis not present

## 2014-12-12 DIAGNOSIS — Z90710 Acquired absence of both cervix and uterus: Secondary | ICD-10-CM

## 2014-12-12 DIAGNOSIS — N951 Menopausal and female climacteric states: Secondary | ICD-10-CM | POA: Diagnosis not present

## 2014-12-12 DIAGNOSIS — D495 Neoplasm of unspecified behavior of other genitourinary organs: Secondary | ICD-10-CM | POA: Insufficient documentation

## 2014-12-12 DIAGNOSIS — D3910 Neoplasm of uncertain behavior of unspecified ovary: Secondary | ICD-10-CM

## 2014-12-12 DIAGNOSIS — Z8603 Personal history of neoplasm of uncertain behavior: Secondary | ICD-10-CM | POA: Diagnosis not present

## 2014-12-12 DIAGNOSIS — Z800 Family history of malignant neoplasm of digestive organs: Secondary | ICD-10-CM | POA: Diagnosis not present

## 2014-12-12 DIAGNOSIS — Z8049 Family history of malignant neoplasm of other genital organs: Secondary | ICD-10-CM

## 2014-12-12 NOTE — Progress Notes (Signed)
Office Visit:  Gyn Oncology  REASON FOR VISIT: Surveillance for low malignant potential tumor.   ASSESSMENT/PLAN  1. 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 No evidence of disease.  Jasmine Clayton has been advised to follow up with  GYN Oncology prn Follow up with Dr. Harolyn Rutherford in 6 months  Referral for genetic counseling given FH of mother with endometrial cancer and a sister with colon cancer  Hernia: Follow-up with General Surgery recommended WEIGHT LOSS ADVISED   HISTORY OF PRESENT ILLNESS: This is a 64 y.o. last normal menstrual in 1998, who fell off a ladder and sustained a laceration to the right lower quadrant. Emergency room evaluation included a CT scan of the abdomen which noted the presence of 12.6 x 9.2 x 11.3 cm mass. She underwent a total abdominal hysterectomy, bilateral salpingo- oophorectomy and intragastric omentectomy on Dec 09, 2009. 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  Patient states that she had a colonoscopy to evaluate rectal bleeding and a T1 N1 colonic malignancy  She underwent a low anterior resection on 05/19/2011 and completed FOLFOX chemotherapy on 01/13/2012. Colostomy takedown occurred 07/2012. There is no evidence of recurrent low malignant potential tumor of the ovary at either of the last 2 surgical procedures.   Mammogram 04/2014 WNL  FH notable for a mother with endometrial cancer and a sister with colon cancer.  No reported h/o genetic counselling  Reports vaginal dryness with dyspareunia.  No vaginal bleeding   Past Medical History  Diagnosis Date  . DVT (deep vein thrombosis) in pregnancy 1979    during pregnancy. lower extremity  . Stress incontinence, female     not current  . Morbid obesity   . 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 History of  chemotherapy      cycle 5 Folfox completed 09/23/11  . History of radiation therapy 10/25/11 -12/01/11    pelvis/presacral region  . Degenerative joint disease     right knee  . Corneal ulcers and infections 07/04/12  . Adenocarcinoma of colon 04/12/2011    "rectal-colon"   Past Surgical History  Procedure Laterality Date  . Total abdominal hysterectomy w/ bilateral salpingoophorectomy  11/30/09    BSO  . Total hip arthroplasty  2002    left hip  . Mass excision  2011    "mass removed from ovaries"  . Cesarean section    . Keloid excision  1982  . Joint replacement  2002    hip replacement  . Colostomy  05/27/11  . Portacath placement  07/26/11    right portacath  . Abdominal hysterectomy  3 years ago  . Colon surgery      LAR, reoperation for necrotic ileostomy, transverse colostomy  . Breast cyst excision  1978  . Colostomy takedown  07/11/2012    Procedure: COLOSTOMY TAKEDOWN;  Surgeon: Rolm Bookbinder, MD;  Location: [facility];  Service: General;  Laterality: N/A;  colostomy takedown  . Port-a-cath removal  07/11/2012    Procedure: REMOVAL PORT-A-CATH;  Surgeon: Rolm Bookbinder, MD;  Location: [facility];  Service: General;  Laterality: N/A;  removal portacath   History   Social History  . Marital Status: Married    Spouse Name: N/A  . Number of Children: 1  . Years of Education: N/A   Occupational History  . 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 Smoking status: Never Smoker   . Smokeless tobacco: Never Used  . Alcohol Use: 0.0 oz/week     Comment: occasional  . Drug Use: No  . Sexual Activity: Not on file   Other Topics Concern  . Not on file   Social History Narrative   Regular exercise:  No   Caffeine Use:  1 cup coffee daily   Married- lives with husband.  Daughter, son-in-law and her 2 children- they recently moved out   Completed the 12th grade, and some college.   Does not work.   REVIEW OF SYSTEMS: Ten-point review  of systems noteworthy for no nausea or vomiting good appetite with weight gain. No shortness of breath cough or hemoptysis. Tenderness at the hernia site, reducible hernia, No nausea. No bloating no changes in caliber of stool or rectal bleeding. No urinary frequency dysuria or incontinence no vaginal bleeding or discharge.  Reports vaginal dryness and dyspareunia.  Otherwise 10 point review of systems is noncontributory   PHYSICAL EXAMINATION: GENERAL: Well-developed, obese female; in no acute distress.  BP 147/65 mmHg  Pulse 74  Temp(Src) 98 F (36.7 C) (Oral)  Resp 18  Ht 5' (1.524 m)  Wt 252 lb 8 oz (114.533 kg)  BMI 49.31 kg/m2  LMP 11/30/2009  ABDOMEN: Soft, nontender, multiple keloid formation noted at the site of laceration in the right lower quadrant and at the site of surgical excision. There is evidence of a super incisional hernia  6cm, reducible, minimally tender BACK:  No CVAT LN.  No cervical supraclavicular or inguinal adenopathy PELVIC EXAMINATION: Normal external genitalia,  Normal  Bartholin's, urethral and Skene's. No masses. No discharge, no bleeding.  Atrophic vagina, No lesions.  No cul de sac masses. No nodularity in the cul-de-sac.  RECTAL EXAMINATION: Good tone no masses EXTREMITIES: A 1 to 2+ lower extremity edema bilaterally.

## 2014-12-12 NOTE — Patient Instructions (Signed)
You will receive a phone call from the [facility] to arrange for you to see the genetics counselor.  Plan to follow up with Dr. Harolyn Rutherford for future care and Dr. Skeet Latch as needed.  Thank you for coming to see me today.  I appreciate your confidence in choosing [facility] for your medical care.  If you have any questions about your visit today, please call our office and we will get back to you as soon as possible.  Dr. Janie Morning Gynecologic Oncology

## 2014-12-16 ENCOUNTER — Telehealth: Payer: Self-pay | Admitting: Genetic Counselor

## 2014-12-16 NOTE — Telephone Encounter (Signed)
genetic referral-left message for patient to return call to schedule genetic appt

## 2014-12-19 ENCOUNTER — Telehealth: Payer: Self-pay | Admitting: Genetic Counselor

## 2014-12-19 NOTE — Telephone Encounter (Signed)
GENETIC APPT-S/W PATIENT AND GAVE APPT FOR 05/26 @ 10 W/GENETIC COUNSELOR

## 2014-12-26 ENCOUNTER — Ambulatory Visit (HOSPITAL_BASED_OUTPATIENT_CLINIC_OR_DEPARTMENT_OTHER): Admitting: Genetic Counselor

## 2014-12-26 ENCOUNTER — Encounter: Payer: Self-pay | Admitting: Genetic Counselor

## 2014-12-26 ENCOUNTER — Other Ambulatory Visit

## 2014-12-26 DIAGNOSIS — Z315 Encounter for genetic counseling: Secondary | ICD-10-CM

## 2014-12-26 DIAGNOSIS — Z800 Family history of malignant neoplasm of digestive organs: Secondary | ICD-10-CM | POA: Diagnosis not present

## 2014-12-26 DIAGNOSIS — Z8049 Family history of malignant neoplasm of other genital organs: Secondary | ICD-10-CM | POA: Diagnosis not present

## 2014-12-26 DIAGNOSIS — C189 Malignant neoplasm of colon, unspecified: Secondary | ICD-10-CM

## 2014-12-26 DIAGNOSIS — C187 Malignant neoplasm of sigmoid colon: Secondary | ICD-10-CM | POA: Diagnosis not present

## 2014-12-26 NOTE — Progress Notes (Signed)
Patient Name: Jasmine Clayton Patient Age: 64 y.o. Encounter Date: 12/26/2014  Referring Physician: Betsy Coder, MD  Primary Care Provider: Nance Pear., NP   Ms. Jasmine Clayton, a 64 y.o. female, is being seen at the [facility] due to a personal and family history of cancer. She presents to clinic today to discuss the possibility of a hereditary predisposition to cancer and discuss whether genetic testing is warranted.  HISTORY OF PRESENT ILLNESS: Ms. 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 She is s/p resection and chemotherapy.  She stated that her last colonoscopy (07/2014) was negative for polyps.  Ms. 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 She is s/p TAH/BSO.  She stated that she has a yearly mammogram and clinical breast exam.  Past Medical History  Diagnosis Date  . DVT (deep vein thrombosis) in pregnancy 1979    during pregnancy. lower extremity  . Stress incontinence, female     not current  . Morbid obesity   . 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 History of chemotherapy      cycle 5 Folfox completed 09/23/11  . History of radiation therapy 10/25/11 -12/01/11    pelvis/presacral region  . Degenerative joint disease     right knee  . Corneal ulcers and infections 07/04/12  . 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 Family history of colon cancer   . Family history of uterine cancer     Past Surgical History  Procedure Laterality Date  . Total abdominal hysterectomy w/ bilateral salpingoophorectomy  11/30/09    BSO  . Total hip arthroplasty  2002    left hip  . Mass excision  2011    "mass removed from ovaries"  . Cesarean section    . Keloid excision  1982  . Joint replacement  2002    hip replacement  . Colostomy  05/27/11  . Portacath placement  07/26/11    right  portacath  . Abdominal hysterectomy  3 years ago  . Colon surgery      LAR, reoperation for necrotic ileostomy, transverse colostomy  . Breast cyst excision  1978  . Colostomy takedown  07/11/2012    Procedure: COLOSTOMY TAKEDOWN;  Surgeon: Rolm Bookbinder, MD;  Location: [facility];  Service: General;  Laterality: N/A;  colostomy takedown  . Port-a-cath removal  07/11/2012    Procedure: REMOVAL PORT-A-CATH;  Surgeon: Rolm Bookbinder, MD;  Location: [facility];  Service: General;  Laterality: N/A;  removal portacath    History   Social History  . Marital Status: Married    Spouse Name: N/A  . Number of Children: 1  . Years of Education: N/A   Occupational History  . 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 Smoking status: Never Smoker   . Smokeless tobacco: Never Used  . Alcohol Use: 0.0 oz/week     Comment: occasional  . Drug Use: No  . Sexual Activity: Not on file   Other Topics Concern  . Not on file   Social History Narrative   Regular exercise:  No   Caffeine Use:  1 cup coffee daily   Married- lives with husband.  Daughter, son-in-law and her 2 children- they recently moved out   Completed the 12th grade, and some college.  Does not work.     FAMILY HISTORY:   During the visit, a 4-generation pedigree was obtained. Family tree will be sent for scanning and will be in EPIC under the Media tab.  Significant diagnoses include the following:  Family History  Problem Relation Age of Onset  . Uterine cancer Mother 68    deceased 30  . Diabetes Father   . Colon cancer Sister 62    currently 54  . Alcoholism Brother     Additionally, Ms. Jasmine Clayton has one daughter (age 66). She has two other sisters and 5 brothers. As far as she knows, none have pursued a colonoscopy. One brother may have prostate cancer. Ms. Jasmine Clayton has 4 maternal uncles and 5 maternal aunts. She has 2 paternal uncles and 3 paternal aunts. She states that she  does not think anyone has had cancer. There are no cancers reported in any of her grandparents.  Ms. Jasmine Clayton ancestry is African American. There is no known Jewish ancestry and no consanguinity.  ASSESSMENT AND PLAN: Ms. Jasmine Clayton is a 64 y.o. female with a personal history of colon cancer and family history of colon and uterine cancer. This history is somewhat suggestive of a hereditary predisposition to cancer, specifically Lynch syndrome, but Ms. Jasmine Clayton has a very large family with numerous relatives who have never had cncer. Given the combination of these cancer, genetic testing is recommended. We reviewed the characteristics, features and inheritance patterns of hereditary cancer syndromes. We also discussed genetic testing, including the appropriate family members to test, the process of testing, insurance coverage and implications of results.   Ms. Jasmine Clayton wished to pursue genetic testing and a blood sample will be sent to [facility] for analysis of the 17 genes on the ColoNext gene panel (APC, BMPR1A, CDH1, CHEK2, EPCAM, GREM1, MLH1, MSH2, MSH6, MUTYH, PMS2, POLD1, POLE, PTEN, SMAD4, STK11, and TP53). We discussed the implications of a positive, negative and/ or Variant of Uncertain Significance (VUS) result. Results should be available in approximately 4 weeks, at which point we will contact her and address implications for her as well as address genetic testing for at-risk family members, if needed.    We encouraged Ms. Jasmine Clayton to remain in contact with Cancer Genetics annually so that we can update the family history and inform her of any changes in cancer genetics and testing that may be of benefit for this family. Ms.  Jasmine Clayton questions were answered to her satisfaction today.   Thank you for the referral and allowing [place] to share in the care of your patient.   The patient was seen for a total of 30 minutes, greater than 50% of which was spent face-to-face counseling. This  patient was discussed with the overseeing provider who agrees with the above.   Steele Berg, MS, [place] Certified Genetic Counselor phone: [phone] [email or web address]

## 2015-01-21 ENCOUNTER — Encounter: Payer: Self-pay | Admitting: Genetic Counselor

## 2015-01-21 DIAGNOSIS — Z1379 Encounter for other screening for genetic and chromosomal anomalies: Secondary | ICD-10-CM | POA: Insufficient documentation

## 2015-01-21 NOTE — Progress Notes (Signed)
GENETIC TEST RESULTS  Patient Name: Jasmine Clayton Patient Age: 64 y.o. Encounter Date: 01/21/2015  Referring Physician: Betsy Coder, MD   Jasmine Clayton was called today to discuss genetic test results. Please see the Genetics note from her visit on 12/26/14 for a detailed discussion of her personal and family history.  GENETIC TESTING: At the time of Jasmine Clayton's visit, we recommended she pursue genetic testing of multiple genes on the ColoNext gene panel. This test, which included sequencing and deletion/duplication analysis of 17 genes, was performed at [facility]. Testing was normal and did not reveal a mutation in these genes. The genes tested were APC, BMPR1A, CDH1, CHEK2, EPCAM, GREM1, MLH1, MSH2, MSH6, MUTYH, PMS2, POLD1, POLE, PTEN, SMAD4, STK11, and TP53.  We discussed with Jasmine Clayton that since the current test is not perfect, it is possible there may be a gene mutation that current testing cannot detect, but that chance is small. We also discussed that it is possible that a different genetic factor, which was not part of this testing or has not yet been discovered, is responsible for the cancer diagnoses in the family. Should Jasmine Clayton wish to discuss or pursue this additional testing, we are happy to coordinate this at any time, but do not feel that she is at significant risk of harboring a mutation in a different gene.     CANCER SCREENING: This result suggests that Jasmine Clayton's cancer was most likely not due to an inherited predisposition. Most cancers happen by chance and this negative test, along with details of her family history, suggests that her cancer falls into this category. We, therefore, recommended she continue to follow the cancer screening guidelines provided by her physician.   FAMILY MEMBERS: Relatives are at some increased risk of developing cancer, over the general population risk, simply due to the family history. We recommended women  have a yearly mammogram beginning at age 97, a yearly clinical breast exam, and perform monthly breast self-exams. A gynecologic exam is recommended yearly. Colon cancer screening is recommended to begin by age 80 for men and women in this family, which is 10 years earlier than the youngest colorectal cancer reported.  Lastly, we discussed with Jasmine Clayton that cancer genetics is a rapidly advancing field and it is possible that new genetic tests will be appropriate for her in the future. We encouraged her to remain in contact with [place] on an annual basis so we can update her personal and family histories, and let her know of advances in cancer genetics that may benefit the family. Our contact number was provided. Jasmine Clayton questions were answered to her satisfaction today, and she knows she is welcome to call anytime with additional questions.    Steele Berg, MS, [employer] Certified Genetic Counselor phone: [phone] [email or web address]

## 2015-03-24 ENCOUNTER — Other Ambulatory Visit: Payer: Self-pay | Admitting: Family

## 2015-04-10 ENCOUNTER — Other Ambulatory Visit: Payer: Self-pay | Admitting: Family

## 2015-04-10 DIAGNOSIS — N6489 Other specified disorders of breast: Secondary | ICD-10-CM

## 2015-04-14 ENCOUNTER — Telehealth: Payer: Self-pay | Admitting: Oncology

## 2015-04-14 ENCOUNTER — Other Ambulatory Visit

## 2015-04-14 ENCOUNTER — Ambulatory Visit: Admitting: Nurse Practitioner

## 2015-04-14 ENCOUNTER — Ambulatory Visit (HOSPITAL_BASED_OUTPATIENT_CLINIC_OR_DEPARTMENT_OTHER): Admitting: Nurse Practitioner

## 2015-04-14 VITALS — BP 137/70 | HR 96 | Temp 98.5°F | Resp 20 | Ht 60.0 in | Wt 249.1 lb

## 2015-04-14 DIAGNOSIS — C189 Malignant neoplasm of colon, unspecified: Secondary | ICD-10-CM

## 2015-04-14 DIAGNOSIS — Z85038 Personal history of other malignant neoplasm of large intestine: Secondary | ICD-10-CM

## 2015-04-14 DIAGNOSIS — Z86718 Personal history of other venous thrombosis and embolism: Secondary | ICD-10-CM | POA: Diagnosis not present

## 2015-04-14 NOTE — Progress Notes (Signed)
  Jasmine Clayton   Diagnosis:  Rectal cancer  INTERVAL HISTORY:   Jasmine Clayton returns as scheduled. No change in bowel habits. She has occasional constipation, maybe one time a month. No bloody or black stools. No nausea or vomiting.  Objective:  Vital signs in last 24 hours:  Blood pressure 137/70, pulse 96, temperature 98.5 F (36.9 C), temperature source Oral, resp. rate 20, height 5' (1.524 m), weight 249 lb 1.6 oz (112.991 kg), last menstrual period 11/30/2009, SpO2 98 %.    HEENT: No thrush or ulcers. Lymphatics: No palpable cervical, supra clavicular, axillary or inguinal lymph nodes. Resp: Lungs clear bilaterally. Cardio: Regular rate and rhythm. GI: Abdomen soft and nontender. No organomegaly. No mass. Vascular: No leg edema.  Lab Results:  Lab Results  Component Value Date   WBC 5.1 11/18/2014   HGB 14.2 11/18/2014   HCT 42.2 11/18/2014   MCV 89.6 11/18/2014   PLT 199.0 11/18/2014   NEUTROABS 3.3 11/18/2014    Imaging:  No results found.  Medications: I have reviewed the patient's current medications.  Assessment/Plan: 1. 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 She began adjuvant FOLFOX chemotherapy 07/26/2011. She completed cycle 6 on 10/07/2011. She began radiation and continuous infusion 5-fluorouracil on 10/25/2011, completed 12/01/2011. FOLFOX resumed for an additional 3 cycles beginning 12/16/2011. She completed the final cycle of chemotherapy 01/13/2012. Restaging CT's 03/08/2012-negative. Colonoscopy on 05/10/2012-negative.  Restaging CTs 03/12/2013 negative.   Restaging CTs 04/03/2014 negative.  Negative surveillance colonoscopy 07/08/2014 2. 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 3. Open abdominal wound. The wound has healed. 4. History of  a DVT while pregnant in 1979. 5. Normal preoperative CEA (1.0 on 05/14/2011). 6. Family history of cancer including endometrial cancer in her mother and colon cancer in her sister. The tumor was submitted for microsatellite instability testing. The tumor showed no evidence of microsatellite instability by PCR. 7. Microcytic anemia. Ferritin was normal at 78 on 09/23/2011.  8. History of thrombocytopenia secondary to chemotherapy. The platelet count was in normal range on 10/16/2013 9. Right knee discomfort. She is followed by orthopedics. 10. Status post colostomy reversal and Port-A-Cath removal 07/11/2012. 11. 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 Doutt remains in clinical remission from rectal cancer. We will follow-up on the CEA from today. She will return for a follow-up visit and CEA in 6 months.    Jasmine Clayton ANP/GNP-BC   04/14/2015  12:01 PM

## 2015-04-14 NOTE — Telephone Encounter (Signed)
per pof to sch to sch to [facility]c pt appt-gave pt copy of avs

## 2015-04-15 ENCOUNTER — Telehealth: Payer: Self-pay | Admitting: Nurse Practitioner

## 2015-04-15 LAB — CEA: CEA: 1.1 ng/mL (ref 0.0–5.0)

## 2015-04-15 NOTE — Telephone Encounter (Signed)
-----   Message from Owens Shark, NP sent at 04/14/2015  1:50 PM EDT ----- Santiago Glad,  Could you please call [name] with this information?  Thanks, Lattie Haw ----- Message -----    From: Dorothyann Gibbs, NP    Sent: 04/14/2015   1:09 PM      To: Owens Shark, NP  Maybe it was so long she can't remember.  It is the [facility] and their number is [phone]. ----- Message -----    From: Owens Shark, NP    Sent: 04/14/2015  12:23 PM      To: Dorothyann Gibbs, NP  That's the name I gave her. She was not familiar with Dr. Harolyn Rutherford and does not know how to get in touch to make an appt.  ----- Message -----    From: Dorothyann Gibbs, NP    Sent: 04/14/2015  12:17 PM      To: Owens Shark, NP  She was originally referred by Dr. Harolyn Rutherford at [facility] so that would prob be the person.  Thanks!  Melissa ----- Message -----    From: Owens Shark, NP    Sent: 04/14/2015  12:15 PM      To: Dorothyann Gibbs, NP  Jasmine Clayton is not sure who Dr. Skeet Latch wanted her to f/u with for GYN needs.   Thanks, Lattie Haw

## 2015-04-15 NOTE — Telephone Encounter (Signed)
As per Lattie Haw called Ms. Caesar in reference to who Dr. Skeet Latch wanted her to follow up with for her Gyn needs. Spoke with Ms. Borah and she asked me to leave the message on her answering machine. Left the message that the Dr's name is Anyanwu at the [facility] and the number is [phone]

## 2015-04-16 ENCOUNTER — Telehealth: Payer: Self-pay | Admitting: *Deleted

## 2015-04-16 NOTE — Telephone Encounter (Signed)
-----   Message from [name], MD sent at 04/16/2015  1:06 AM EDT ----- Please call patient, cea is normal

## 2015-04-16 NOTE — Telephone Encounter (Signed)
Per Dr. Benay Spice; notified pt that cea is normal.  "Praise God" Pt verbalized understanding and expressed appreciation for call.

## 2015-05-12 ENCOUNTER — Ambulatory Visit
Admission: RE | Admit: 2015-05-12 | Discharge: 2015-05-12 | Disposition: A | Discharge status: Home / Self Care | Source: Ambulatory Visit | Attending: Family | Admitting: Family

## 2015-05-12 DIAGNOSIS — N6489 Other specified disorders of breast: Secondary | ICD-10-CM

## 2015-05-20 ENCOUNTER — Ambulatory Visit: Admitting: Family

## 2015-05-26 ENCOUNTER — Encounter: Payer: Self-pay | Admitting: Family

## 2015-05-26 ENCOUNTER — Ambulatory Visit (INDEPENDENT_AMBULATORY_CARE_PROVIDER_SITE_OTHER): Admitting: Family

## 2015-05-26 ENCOUNTER — Telehealth: Payer: Self-pay | Admitting: Family

## 2015-05-26 VITALS — HR 72 | Temp 98.3°F | Resp 14 | Ht 60.0 in | Wt 248.4 lb

## 2015-05-26 DIAGNOSIS — I10 Essential (primary) hypertension: Secondary | ICD-10-CM

## 2015-05-26 DIAGNOSIS — Z23 Encounter for immunization: Secondary | ICD-10-CM | POA: Diagnosis not present

## 2015-05-26 DIAGNOSIS — E876 Hypokalemia: Secondary | ICD-10-CM

## 2015-05-26 DIAGNOSIS — R739 Hyperglycemia, unspecified: Secondary | ICD-10-CM

## 2015-05-26 LAB — HEMOGLOBIN A1C: Hgb A1c MFr Bld: 5.7 % (ref 4.6–6.5)

## 2015-05-26 LAB — BASIC METABOLIC PANEL
BUN: 19 mg/dL (ref 6–23)
CALCIUM: 9.5 mg/dL (ref 8.4–10.5)
CO2: 31 mEq/L (ref 19–32)
Chloride: 99 mEq/L (ref 96–112)
Creatinine, Ser: 1.06 mg/dL (ref 0.40–1.20)
GFR: 67.28 mL/min (ref >60.00)
GLUCOSE: 98 mg/dL (ref 70–99)
Potassium: 3.3 mEq/L — ABNORMAL LOW (ref 3.5–5.1)
Sodium: 139 mEq/L (ref 135–145)

## 2015-05-26 MED ORDER — HYDROCHLOROTHIAZIDE 25 MG PO TABS: 25.0000 mg | ORAL_TABLET | Freq: Every day | ORAL | Status: DC

## 2015-05-26 MED ORDER — POTASSIUM CHLORIDE CRYS ER 20 MEQ PO TBCR: EXTENDED_RELEASE_TABLET | ORAL | Status: DC

## 2015-05-26 NOTE — Progress Notes (Signed)
Subjective:    Patient ID: Jasmine Clayton, female    DOB: [birth year], 64 y.o.   MRN: [id number]  HPI   Jasmine Clayton is a 64 yr old female who presents today for follow up.  1) HTN-  Maintained on HCTZ.  Reports good compliance with med.   BP Readings from Last 3 Encounters:  04/14/15 137/70  12/12/14 147/65  11/18/14 122/78    2) Hyperglycemia-   Lab Results  Component Value Date   HGBA1C 5.8 11/18/2014   3) Cough- pt reports 1 week hx of cough. Cough is dry.  Denies fever, does not feel sick.  She reports mild associated Headache.  Has "tickle" and "scratch" in her throat.  Reports that she gets this cough, "seasonally."      Review of Systems See HPI  Past Medical History  Diagnosis Date  . DVT (deep vein thrombosis) in pregnancy 1979    during pregnancy. lower extremity  . Stress incontinence, female     not current  . Morbid obesity ([place])   . 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 History of chemotherapy      cycle 5 Folfox completed 09/23/11  . History of radiation therapy 10/25/11 -12/01/11    pelvis/presacral region  . Degenerative joint disease     right knee  . Corneal ulcers and infections 07/04/12  . Adenocarcinoma of colon ([place]) 04/12/2011    "rectal-colon"  . Family history of colon cancer   . Family history of uterine cancer     Social History   Social History  . Marital Status: Married    Spouse Name: N/A  . Number of Children: 1  . Years of Education: N/A   Occupational History  . 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 Smoking status: Never Smoker   . Smokeless tobacco: Never Used  . Alcohol Use: 0.0 oz/week     Comment: occasional  . Drug Use: No  . Sexual Activity: Not on file   Other Topics Concern  . Not on file   Social History Narrative   Regular exercise:  No   Caffeine Use:  1 cup coffee daily   Married- lives with  husband.  Daughter, son-in-law and her 2 children- they recently moved out   Completed the 12th grade, and some college.   Does not work.    Past Surgical History  Procedure Laterality Date  . Total abdominal hysterectomy w/ bilateral salpingoophorectomy  11/30/09    BSO  . Total hip arthroplasty  2002    left hip  . Mass excision  2011    "mass removed from ovaries"  . Cesarean section    . Keloid excision  1982  . Joint replacement  2002    hip replacement  . Colostomy  05/27/11  . Portacath placement  07/26/11    right portacath  . Abdominal hysterectomy  3 years ago  . Colon surgery      LAR, reoperation for necrotic ileostomy, transverse colostomy  . Breast cyst excision  1978  . Colostomy takedown  07/11/2012    Procedure: COLOSTOMY TAKEDOWN;  Surgeon: Rolm Bookbinder, MD;  Location: [facility];  Service: General;  Laterality: N/A;  colostomy takedown  . Port-a-cath removal  07/11/2012    Procedure: REMOVAL PORT-A-CATH;  Surgeon: Rolm Bookbinder, MD;  Location: [facility];  Service: General;  Laterality: N/A;  removal portacath    Family History  Problem Relation Age of Onset  . Uterine cancer Mother 39    deceased 59  . Diabetes Father   . Colon cancer Sister 58    currently 58  . Alcoholism Brother     No Known Allergies  Current Outpatient Prescriptions on File Prior to Visit  Medication Sig Dispense Refill  . aspirin 81 MG tablet Take 81 mg by mouth daily.      . B Complex Vitamins (B-COMPLEX/B-12 PO) Take 1 tablet by mouth daily.     . Calcium Carb-Cholecalciferol (CALCIUM-VITAMIN D3) 600-500 MG-UNIT CAPS Take 1 tablet by mouth daily.    . hydrochlorothiazide (HYDRODIURIL) 25 MG tablet TAKE 1 TABLET BY MOUTH EVERY DAY 90 tablet 1  . Omega-3 Fatty Acids (FISH OIL) 1200 MG CAPS Take 1 capsule by mouth daily.    . potassium chloride SA (K-DUR,KLOR-CON) 20 MEQ tablet TAKE 1 TABLET(20 MEQ) BY MOUTH DAILY 30 tablet 3  . PRESCRIPTION MEDICATION FlurbiproFen  10%-DICLOFENAC 10%-GABAPENTIN 10%-LIDOCAINE 5%- COMPOUNDED MEDICATION TO BE APPLIED TO KNEE 3-4 TIMES DAILY     Current Facility-Administered Medications on File Prior to Visit  Medication Dose Route Frequency Provider Last Rate Last Dose  . sodium chloride 0.9 % injection 10 mL  10 mL Intravenous PRN [name], MD   10 mL at 05/25/12 1153    Pulse 72  Temp(Src) 98.3 F (36.8 C) (Oral)  Resp 14  Ht 5' (1.524 m)  Wt 248 lb 6.4 oz (112.674 kg)  BMI 48.51 kg/m2  SpO2 99%  LMP 11/30/2009       Objective:   Physical Exam  Constitutional: She is oriented to person, place, and time. She appears well-developed and well-nourished.  HENT:  Head: Normocephalic and atraumatic.  Right Ear: Tympanic membrane and ear canal normal.  Left Ear: Tympanic membrane and ear canal normal.  Mouth/Throat: No oropharyngeal exudate, posterior oropharyngeal edema or posterior oropharyngeal erythema.  Eyes: No scleral icterus.  Cardiovascular: Normal rate, regular rhythm and normal heart sounds.   No murmur heard. Pulmonary/Chest: Effort normal and breath sounds normal. No respiratory distress. She has no wheezes.  Musculoskeletal: She exhibits no edema.  Lymphadenopathy:    She has no cervical adenopathy.  Neurological: She is alert and oriented to person, place, and time.  Skin: Skin is warm and dry.  Psychiatric: She has a normal mood and affect. Her behavior is normal. Judgment and thought content normal.          Assessment & Plan:  Cough- suspect allergy related. Trial of claritin. Pt advised to call if symptoms worsen or do not improve.

## 2015-05-26 NOTE — Assessment & Plan Note (Signed)
Obtain follow up A1C.   

## 2015-05-26 NOTE — Assessment & Plan Note (Signed)
BP stable on HCTZ, continue same, obtain bmet.  

## 2015-05-26 NOTE — Progress Notes (Signed)
Pre visit review using our clinic review tool, if applicable. No additional management support is needed unless otherwise documented below in the visit note. 

## 2015-05-26 NOTE — Telephone Encounter (Signed)
K+ is low. Is she taking K dur every day? If so, please increase to bid and follow up in 1 week for bmet dx hypokalemia.

## 2015-05-26 NOTE — Patient Instructions (Signed)
Add claritin 10mg  once daily. Call if cough worsens, if you develop fever or if cough does not improve.  Please complete lab work prior to leaving.

## 2015-05-27 NOTE — Telephone Encounter (Signed)
Spoke with pt and she voices understanding.  

## 2015-06-12 ENCOUNTER — Encounter (INDEPENDENT_AMBULATORY_CARE_PROVIDER_SITE_OTHER): Payer: Self-pay

## 2015-06-12 ENCOUNTER — Other Ambulatory Visit (INDEPENDENT_AMBULATORY_CARE_PROVIDER_SITE_OTHER)

## 2015-06-12 DIAGNOSIS — E876 Hypokalemia: Secondary | ICD-10-CM

## 2015-06-12 LAB — BASIC METABOLIC PANEL
BUN: 14 mg/dL (ref 6–23)
CHLORIDE: 101 meq/L (ref 96–112)
CO2: 29 meq/L (ref 19–32)
CREATININE: 1.03 mg/dL (ref 0.40–1.20)
Calcium: 9.7 mg/dL (ref 8.4–10.5)
GFR: 69.54 mL/min (ref >60.00)
GLUCOSE: 95 mg/dL (ref 70–99)
Potassium: 3.7 mEq/L (ref 3.5–5.1)
Sodium: 138 mEq/L (ref 135–145)

## 2015-07-20 ENCOUNTER — Other Ambulatory Visit: Payer: Self-pay | Admitting: Family

## 2015-07-31 ENCOUNTER — Ambulatory Visit (INDEPENDENT_AMBULATORY_CARE_PROVIDER_SITE_OTHER): Admitting: Medical

## 2015-07-31 ENCOUNTER — Encounter: Payer: Self-pay | Admitting: Medical

## 2015-07-31 VITALS — BP 124/70 | HR 90 | Temp 97.8°F | Ht 60.0 in | Wt 252.4 lb

## 2015-07-31 DIAGNOSIS — T148XXA Other injury of unspecified body region, initial encounter: Secondary | ICD-10-CM

## 2015-07-31 DIAGNOSIS — L089 Local infection of the skin and subcutaneous tissue, unspecified: Secondary | ICD-10-CM | POA: Diagnosis not present

## 2015-07-31 MED ORDER — CEPHALEXIN 500 MG PO CAPS: 500.0000 mg | ORAL_CAPSULE | Freq: Two times a day (BID) | ORAL | Status: DC

## 2015-07-31 NOTE — Patient Instructions (Addendum)
For your rt toe region where blister burst, I gave you some telfa  non stick dressing to cover healing scab and reduce friction.   Use neosporin on area twice daily and start cephalexin.  If area worsens as described then UC or ED   Follow up next Wednesday in am or sooner if needed  Use probiotic while on antibiotic.(Advise pt)

## 2015-07-31 NOTE — Progress Notes (Signed)
Pre visit review using our clinic review tool, if applicable. No additional management support is needed unless otherwise documented below in the visit note. 

## 2015-07-31 NOTE — Progress Notes (Signed)
Subjective:    Patient ID: Jasmine Clayton, female    DOB: January 24, 1951, 64 y.o.   MRN: [id number]  HPI  Pt in for rt great toe irritated area for about one week. This area was on top of her rt great toe was region of irritation. Pt saw blister on past Wednesday and this past Friday she popped blister. Pt is not diabetic. Pt a1-c in October was 5.7. She not sure how she got this blister. No insect bite reported and no known friction injury.   Review of Systems  Constitutional: Negative for fever, chills and fatigue.  Respiratory: Negative for cough, shortness of breath and wheezing.   Cardiovascular: Negative for chest pain and palpitations.  Musculoskeletal:       See hpi  Skin:       See hpi.    Past Medical History  Diagnosis Date  . DVT (deep vein thrombosis) in pregnancy 1979    during pregnancy. lower extremity  . Stress incontinence, female     not current  . Morbid obesity ([place])   . 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 History of chemotherapy      cycle 5 Folfox completed 09/23/11  . History of radiation therapy 10/25/11 -12/01/11    pelvis/presacral region  . Degenerative joint disease     right knee  . Corneal ulcers and infections 07/04/12  . Adenocarcinoma of colon ([place]) 04/12/2011    "rectal-colon"  . Family history of colon cancer   . Family history of uterine cancer     Social History   Social History  . Marital Status: Married    Spouse Name: N/A  . Number of Children: 1  . Years of Education: N/A   Occupational History  . 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 Smoking status: Never Smoker   . Smokeless tobacco: Never Used  . Alcohol Use: 0.0 oz/week     Comment: occasional  . Drug Use: No  . Sexual Activity: Not on file   Other Topics Concern  . Not on file   Social History Narrative   Regular exercise:  No   Caffeine Use:  1 cup coffee  daily   Married- lives with husband.  Daughter, son-in-law and her 2 children- they recently moved out   Completed the 12th grade, and some college.   Does not work.    Past Surgical History  Procedure Laterality Date  . Total abdominal hysterectomy w/ bilateral salpingoophorectomy  11/30/09    BSO  . Total hip arthroplasty  2002    left hip  . Mass excision  2011    "mass removed from ovaries"  . Cesarean section    . Keloid excision  1982  . Joint replacement  2002    hip replacement  . Colostomy  05/27/11  . Portacath placement  07/26/11    right portacath  . Abdominal hysterectomy  3 years ago  . Colon surgery      LAR, reoperation for necrotic ileostomy, transverse colostomy  . Breast cyst excision  1978  . Colostomy takedown  07/11/2012    Procedure: COLOSTOMY TAKEDOWN;  Surgeon: Rolm Bookbinder, MD;  Location: [facility];  Service: General;  Laterality: N/A;  colostomy takedown  . Port-a-cath removal  07/11/2012    Procedure: REMOVAL PORT-A-CATH;  Surgeon: Rolm Bookbinder, MD;  Location: [facility];  Service:  General;  Laterality: N/A;  removal portacath    Family History  Problem Relation Age of Onset  . Uterine cancer Mother 16    deceased 68  . Diabetes Father   . Colon cancer Sister 42    currently 95  . Alcoholism Brother     No Known Allergies  Current Outpatient Prescriptions on File Prior to Visit  Medication Sig Dispense Refill  . aspirin 81 MG tablet Take 81 mg by mouth daily.      . B Complex Vitamins (B-COMPLEX/B-12 PO) Take 1 tablet by mouth daily.     . Calcium Carb-Cholecalciferol (CALCIUM-VITAMIN D3) 600-500 MG-UNIT CAPS Take 1 tablet by mouth daily.    . hydrochlorothiazide (HYDRODIURIL) 25 MG tablet Take 1 tablet (25 mg total) by mouth daily. 90 tablet 1  . Omega-3 Fatty Acids (FISH OIL) 1200 MG CAPS Take 1 capsule by mouth daily.    . potassium chloride SA (K-DUR,KLOR-CON) 20 MEQ tablet TAKE 1 TABLET(20 MEQ) BY MOUTH DAILY 90 tablet 1  .  PRESCRIPTION MEDICATION FlurbiproFen 10%-DICLOFENAC 10%-GABAPENTIN 10%-LIDOCAINE 5%- COMPOUNDED MEDICATION TO BE APPLIED TO KNEE 3-4 TIMES DAILY     Current Facility-Administered Medications on File Prior to Visit  Medication Dose Route Frequency Provider Last Rate Last Dose  . sodium chloride 0.9 % injection 10 mL  10 mL Intravenous PRN [name], MD   10 mL at 05/25/12 1153    BP 124/70 mmHg  Pulse 90  Temp(Src) 97.8 F (36.6 C) (Oral)  Ht 5' (1.524 m)  Wt 252 lb 6.4 oz (114.488 kg)  BMI 49.29 kg/m2  SpO2 97%  LMP 11/30/2009      Objective:   Physical Exam   General- No acute distress. Pleasant patient. Neck- Full range of motion, no jvd Lungs- Clear, even and unlabored. Heart- regular rate and rhythm. Neurologic- CNII- XII grossly intact.  Rt great toe- just beneath nail 3 mm x 57mm scab. No dc. Area is tender. Good capillary refill.       Assessment & Plan:  For your rt toe region where blister burst, I gave you some telfa  non stick dressing to cover healing scab and reduce friction.   Use neosporin on area twice daily and start cephalexin.  If area worsens as described then UC or ED   Follow up next Wednesday in am or sooner if needed  Use probiotic while on antibiotic.(Advise pt)

## 2015-08-06 ENCOUNTER — Encounter: Payer: Self-pay | Admitting: Medical

## 2015-08-06 ENCOUNTER — Ambulatory Visit (INDEPENDENT_AMBULATORY_CARE_PROVIDER_SITE_OTHER): Admitting: Medical

## 2015-08-06 VITALS — BP 122/78 | HR 81 | Temp 98.1°F | Ht 60.0 in | Wt 251.2 lb

## 2015-08-06 DIAGNOSIS — L089 Local infection of the skin and subcutaneous tissue, unspecified: Secondary | ICD-10-CM

## 2015-08-06 DIAGNOSIS — L03031 Cellulitis of right toe: Secondary | ICD-10-CM

## 2015-08-06 MED ORDER — SULFAMETHOXAZOLE-TRIMETHOPRIM 800-160 MG PO TABS: 1.0000 | ORAL_TABLET | Freq: Two times a day (BID) | ORAL | Status: DC

## 2015-08-06 NOTE — Progress Notes (Signed)
Pre visit review using our clinic review tool, if applicable. No additional management support is needed unless otherwise documented below in the visit note. 

## 2015-08-06 NOTE — Patient Instructions (Addendum)
Infection of toe that occurred despite being on cephalexin and topical antibiotic. I will go ahead and get wound culture. Stop cephalexin and start bactim DS which has more coverage.   Will go ahead and refer you to podiatrist since nail is weak/loose. If infection worsens may refer you to wound management as well.  Will ask that they see by mid next week.   Follow up with [place] by middle  of next week if not seen by specialist.

## 2015-08-06 NOTE — Progress Notes (Signed)
Subjective:    Patient ID: Jasmine Clayton, female    DOB: 01-Sep-1950, 65 y.o.   MRN: [id number]  HPI  Pt in for follow up. Please see prior note description. Pt has no fever, no chills or sweats. Pt has some color change to skin of her rt great toe. Slight white/yelllow color change to skin. No yellow discharge to the skin. Skin change occurred 2 days ago.  Pt has placed neosporin on wound and the antibiotic cephalexin. Changes above occurred despite treatment.   Review of Systems  Constitutional: Negative for fever, chills and fatigue.  Respiratory: Negative for cough, choking and wheezing.   Cardiovascular: Negative for chest pain and palpitations.  Musculoskeletal: Negative for back pain.  Skin:       Rt great toe infection.See hpi.  Hematological: Negative for adenopathy. Does not bruise/bleed easily.  Psychiatric/Behavioral: Negative for behavioral problems and confusion.    Past Medical History  Diagnosis Date  . DVT (deep vein thrombosis) in pregnancy 1979    during pregnancy. lower extremity  . Stress incontinence, female     not current  . Morbid obesity ([place])   . 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 History of chemotherapy      cycle 5 Folfox completed 09/23/11  . History of radiation therapy 10/25/11 -12/01/11    pelvis/presacral region  . Degenerative joint disease     right knee  . Corneal ulcers and infections 07/04/12  . Adenocarcinoma of colon ([place]) 04/12/2011    "rectal-colon"  . Family history of colon cancer   . Family history of uterine cancer     Social History   Social History  . Marital Status: Married    Spouse Name: N/A  . Number of Children: 1  . Years of Education: N/A   Occupational History  . 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 Smoking status: Never Smoker   . Smokeless tobacco: Never Used  . Alcohol Use: 0.0 oz/week   Comment: occasional  . Drug Use: No  . Sexual Activity: Not on file   Other Topics Concern  . Not on file   Social History Narrative   Regular exercise:  No   Caffeine Use:  1 cup coffee daily   Married- lives with husband.  Daughter, son-in-law and her 2 children- they recently moved out   Completed the 12th grade, and some college.   Does not work.    Past Surgical History  Procedure Laterality Date  . Total abdominal hysterectomy w/ bilateral salpingoophorectomy  11/30/09    BSO  . Total hip arthroplasty  2002    left hip  . Mass excision  2011    "mass removed from ovaries"  . Cesarean section    . Keloid excision  1982  . Joint replacement  2002    hip replacement  . Colostomy  05/27/11  . Portacath placement  07/26/11    right portacath  . Abdominal hysterectomy  3 years ago  . Colon surgery      LAR, reoperation for necrotic ileostomy, transverse colostomy  . Breast cyst excision  1978  . Colostomy takedown  07/11/2012    Procedure: COLOSTOMY TAKEDOWN;  Surgeon: Rolm Bookbinder, MD;  Location: [facility];  Service: General;  Laterality: N/A;  colostomy takedown  . Port-a-cath removal  07/11/2012    Procedure: REMOVAL PORT-A-CATH;  Surgeon:  Rolm Bookbinder, MD;  Location: [facility];  Service: General;  Laterality: N/A;  removal portacath    Family History  Problem Relation Age of Onset  . Uterine cancer Mother 66    deceased 27  . Diabetes Father   . Colon cancer Sister 35    currently 54  . Alcoholism Brother     No Known Allergies  Current Outpatient Prescriptions on File Prior to Visit  Medication Sig Dispense Refill  . aspirin 81 MG tablet Take 81 mg by mouth daily.      . B Complex Vitamins (B-COMPLEX/B-12 PO) Take 1 tablet by mouth daily.     . Calcium Carb-Cholecalciferol (CALCIUM-VITAMIN D3) 600-500 MG-UNIT CAPS Take 1 tablet by mouth daily.    . cephALEXin (KEFLEX) 500 MG capsule Take 1 capsule (500 mg total) by mouth 2 (two) times daily. 14 capsule  0  . hydrochlorothiazide (HYDRODIURIL) 25 MG tablet Take 1 tablet (25 mg total) by mouth daily. 90 tablet 1  . Omega-3 Fatty Acids (FISH OIL) 1200 MG CAPS Take 1 capsule by mouth daily.    . potassium chloride SA (K-DUR,KLOR-CON) 20 MEQ tablet TAKE 1 TABLET(20 MEQ) BY MOUTH DAILY 90 tablet 1  . PRESCRIPTION MEDICATION FlurbiproFen 10%-DICLOFENAC 10%-GABAPENTIN 10%-LIDOCAINE 5%- COMPOUNDED MEDICATION TO BE APPLIED TO KNEE 3-4 TIMES DAILY     Current Facility-Administered Medications on File Prior to Visit  Medication Dose Route Frequency Provider Last Rate Last Dose  . sodium chloride 0.9 % injection 10 mL  10 mL Intravenous PRN [name], MD   10 mL at 05/25/12 1153    BP 122/78 mmHg  Pulse 81  Temp(Src) 98.1 F (36.7 C) (Oral)  Ht 5' (1.524 m)  Wt 251 lb 3.2 oz (113.944 kg)  BMI 49.06 kg/m2  SpO2 96%  LMP 11/30/2009       Objective:   Physical Exam  General- No acute distress. Pleasant patient.  Rt great toe- just at base of nail nail 3 mm x 81mm scab. No dc. Area is faint tender. Good capillary refill. But surrounding area of scab has whitish yellow discolored appearance to skin. Appears to have dry yellow discharge at edge of the nail. Slight foul smell on close inspection of toe and nail feels little loose.     Assessment & Plan:  Infection of toe that occurred despite being on cephalexin and topical antibiotic. I will go ahead and get wound culture. Stop cephalexin and start bactim DS which has more coverage.   Will go ahead and refer you to podiatrist since nail is weak/loose. If infection worsens may refer you to wound management as well.  Will ask that they see by mid next week.   Follow up with [place] by middle  of next week if not seen by specialist

## 2015-08-07 ENCOUNTER — Encounter: Payer: Self-pay | Admitting: Podiatry

## 2015-08-07 ENCOUNTER — Ambulatory Visit (INDEPENDENT_AMBULATORY_CARE_PROVIDER_SITE_OTHER): Admitting: Podiatry

## 2015-08-07 VITALS — BP 140/80 | HR 75 | Ht 60.0 in | Wt 251.0 lb

## 2015-08-07 DIAGNOSIS — S99921A Unspecified injury of right foot, initial encounter: Secondary | ICD-10-CM

## 2015-08-07 DIAGNOSIS — M79674 Pain in right toe(s): Secondary | ICD-10-CM | POA: Insufficient documentation

## 2015-08-07 DIAGNOSIS — L089 Local infection of the skin and subcutaneous tissue, unspecified: Secondary | ICD-10-CM | POA: Diagnosis not present

## 2015-08-07 DIAGNOSIS — S90821A Blister (nonthermal), right foot, initial encounter: Secondary | ICD-10-CM

## 2015-08-07 NOTE — Patient Instructions (Signed)
Seen for injured nail right great toe. Removed right great toe nail and dressing applied with Iodine. Soak at home daily till the area gets dry. Keep dressing during the day and leave it open at night. Return as needed.

## 2015-08-07 NOTE — Progress Notes (Signed)
SUBJECTIVE: 65 y.o. year old female presents for pain and blistered nail right great toe x 2-3 weeks.  REVIEW OF SYSTEMS: Constitutional: negative Eyes: negative Ears, nose, mouth, throat, and face: negative Respiratory: negative Cardiovascular: negative Gastrointestinal: negative Genitourinary:negative Integument/breast: negative Musculoskeletal:Right knee pain x 2 years. Neurological: negative Endocrine: negative Allergic/Immunologic: negative.  OBJECTIVE: DERMATOLOGIC EXAMINATION: Nails: Loose right great toe nail from injury. Blister and skin abrasion proximal to the nail bed. Blister has broken off and no extended cellulitis noted. Draining anil at the proximal medial border with loose nail plate right hallux.  VASCULAR EXAMINATION OF LOWER LIMBS: Pedal pulses: All pedal pulses are palpable with normal pulsation.  No edema or erythema noted.  NEUROLOGIC EXAMINATION OF THE LOWER LIMBS: All epicritic and tactile sensations grossly intact. Pain in right great toe.  MUSCULOSKELETAL EXAMINATION: No gross deformities noted.  ASSESSMENT: Pain right great toe. Nail injury right great toe with skin abrasion and drainage. Infection with blister formation, right great toe medial border without extending cellulitis.  PLAN: Reviewed findings. Removed loose nail right great toe. Area cleansed with iodine and dressing applied.  Home care instruction given, soaking in Epson salt or Iodine solution till the nail bed become dry and pain free. Return as needed.

## 2015-08-10 LAB — WOUND CULTURE
GRAM STAIN: NONE SEEN
GRAM STAIN: NONE SEEN

## 2015-08-11 ENCOUNTER — Telehealth: Payer: Self-pay | Admitting: Medical

## 2015-08-11 NOTE — Telephone Encounter (Signed)
Can you advise me on what status of referral to podiatrist?

## 2015-08-11 NOTE — Telephone Encounter (Signed)
Patient was seen on 08/07/15 by Dr Caffie Pinto

## 2015-08-27 ENCOUNTER — Telehealth: Payer: Self-pay | Admitting: Oncology

## 2015-08-27 NOTE — Telephone Encounter (Signed)
Unable to leave message for patient about appointment changes due to the provider being out of the office. Schedule sent by mail. °

## 2015-09-04 ENCOUNTER — Encounter: Payer: Self-pay | Admitting: Obstetrics & Gynecology

## 2015-09-04 ENCOUNTER — Ambulatory Visit (INDEPENDENT_AMBULATORY_CARE_PROVIDER_SITE_OTHER): Admitting: Obstetrics & Gynecology

## 2015-09-04 VITALS — BP 147/77 | HR 64 | Temp 98.0°F | Ht 60.0 in | Wt 250.0 lb

## 2015-09-04 DIAGNOSIS — Z01419 Encounter for gynecological examination (general) (routine) without abnormal findings: Secondary | ICD-10-CM

## 2015-09-04 NOTE — Patient Instructions (Signed)
Return to clinic for any scheduled appointments or for any gynecologic concerns as needed.   

## 2015-09-04 NOTE — Progress Notes (Signed)
GYNECOLOGY CLINIC ANNUAL PREVENTATIVE CARE ENCOUNTER NOTE  Subjective:   Jasmine Clayton is a 65 y.o. PMP female s/p TAH/BSO for low malignant potential ovarian tumor here for a routine annual gynecologic exam.  Current complaints: none.   Denies abnormal vaginal bleeding, discharge, pelvic pain, significant problems with intercourse or other gynecologic concerns.  Endorses some vaginal dryness, uses lubrication during intercourse   Gynecologic History Patient's last menstrual period was 11/30/2009. Last mammogram: 05/12/2015. Results were: normal  Obstetric History OB History  No data available    Past Medical History  Diagnosis Date  . DVT (deep vein thrombosis) in pregnancy 1979    during pregnancy. lower extremity  . Stress incontinence, female     not current  . Morbid obesity ([place])   . 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 History of chemotherapy      cycle 5 Folfox completed 09/23/11  . History of radiation therapy 10/25/11 -12/01/11    pelvis/presacral region  . Degenerative joint disease     right knee  . Corneal ulcers and infections 07/04/12  . Adenocarcinoma of colon ([place]) 04/12/2011    "rectal-colon"  . Family history of colon cancer   . Family history of uterine cancer     Past Surgical History  Procedure Laterality Date  . Total abdominal hysterectomy w/ bilateral salpingoophorectomy  11/30/09    BSO  . Total hip arthroplasty  2002    left hip  . Mass excision  2011    "mass removed from ovaries"  . Cesarean section    . Keloid excision  1982  . Joint replacement  2002    hip replacement  . Colostomy  05/27/11  . Portacath placement  07/26/11    right portacath  . Abdominal hysterectomy  3 years ago  . Colon surgery      LAR, reoperation for necrotic ileostomy, transverse colostomy  . Breast cyst excision  1978  . Colostomy takedown  07/11/2012    Procedure: COLOSTOMY TAKEDOWN;   Surgeon: Rolm Bookbinder, MD;  Location: [facility];  Service: General;  Laterality: N/A;  colostomy takedown  . Port-a-cath removal  07/11/2012    Procedure: REMOVAL PORT-A-CATH;  Surgeon: Rolm Bookbinder, MD;  Location: [facility];  Service: General;  Laterality: N/A;  removal portacath    Current Outpatient Prescriptions on File Prior to Visit  Medication Sig Dispense Refill  . aspirin 81 MG tablet Take 81 mg by mouth daily.      . B Complex Vitamins (B-COMPLEX/B-12 PO) Take 1 tablet by mouth daily.     . Calcium Carb-Cholecalciferol (CALCIUM-VITAMIN D3) 600-500 MG-UNIT CAPS Take 1 tablet by mouth daily.    . cephALEXin (KEFLEX) 500 MG capsule   0  . hydrochlorothiazide (HYDRODIURIL) 25 MG tablet Take 1 tablet (25 mg total) by mouth daily. 90 tablet 1  . Omega-3 Fatty Acids (FISH OIL) 1200 MG CAPS Take 1 capsule by mouth daily.    . potassium chloride SA (K-DUR,KLOR-CON) 20 MEQ tablet TAKE 1 TABLET(20 MEQ) BY MOUTH DAILY 90 tablet 1   Current Facility-Administered Medications on File Prior to Visit  Medication Dose Route Frequency Provider Last Rate Last Dose  . sodium chloride 0.9 % injection 10 mL  10 mL Intravenous PRN [name], MD   10 mL at 05/25/12 1153    No Known Allergies  Social History   Social History  . Marital Status: Married  Spouse Name: N/A  . Number of Children: 1  . Years of Education: N/A   Occupational History  . 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 Smoking status: Never Smoker   . Smokeless tobacco: Never Used  . Alcohol Use: 0.0 oz/week    0 Standard drinks or equivalent per week     Comment: occasional  . Drug Use: No  . Sexual Activity: Not on file   Other Topics Concern  . Not on file   Social History Narrative   Regular exercise:  No   Caffeine Use:  1 cup coffee daily   Married- lives with husband.  Daughter, son-in-law and her 2 children- they recently moved out   Completed the 12th grade, and  some college.   Does not work.    Family History  Problem Relation Age of Onset  . Uterine cancer Mother 50    deceased 58  . Diabetes Father   . Colon cancer Sister 27    currently 32  . Alcoholism Brother     The following portions of the patient's history were reviewed and updated as appropriate: allergies, current medications, past family history, past medical history, past social history, past surgical history and problem list.  Review of Systems Pertinent items noted in HPI and remainder of comprehensive ROS otherwise negative.   Objective:  BP 147/77 mmHg  Pulse 64  Temp(Src) 98 F (36.7 C) (Oral)  Ht 5' (1.524 m)  Wt 250 lb (113.399 kg)  BMI 48.82 kg/m2  LMP 11/30/2009 CONSTITUTIONAL: Well-developed, well-nourished female in no acute distress.  HENT:  Normocephalic, atraumatic, External right and left ear normal. Oropharynx is clear and moist EYES: Conjunctivae and EOM are normal. Pupils are equal, round, and reactive to light. No scleral icterus.  NECK: Normal range of motion, supple, no masses.  Normal thyroid.  SKIN: Skin is warm and dry. No rash noted. Not diaphoretic. No erythema. No pallor. [place]: Alert and oriented to person, place, and time. Normal reflexes, muscle tone coordination. No cranial nerve deficit noted. PSYCHIATRIC: Normal mood and affect. Normal behavior. Normal judgment and thought content. CARDIOVASCULAR: Normal heart rate noted, regular rhythm RESPIRATORY:  Effort and breath sounds normal, no problems with respiration noted. BREASTS: Symmetric in size. No masses, skin changes, nipple drainage, or lymphadenopathy. ABDOMEN: Soft, normal bowel sounds, no distention noted.  No tenderness, rebound or guarding.  PELVIC: Normal appearing external genitalia; normal appearing vaginal cuff with moderate atrophy,  No abnormal discharge noted.  MUSCULOSKELETAL: Normal range of motion. No tenderness.  No cyanosis, clubbing, or edema.  2+ distal  pulses.   Assessment:  Annual gynecologic examination.   Plan:  No need for pap smears, no other gynecologic concerns Routine preventative health maintenance measures emphasized. Please refer to After Visit Summary for other counseling recommendations.    Verita Schneiders, MD, [place] Attending Obstetrician & Gynecologist, [place] [facility]

## 2015-10-13 ENCOUNTER — Other Ambulatory Visit

## 2015-10-13 ENCOUNTER — Ambulatory Visit: Admitting: Oncology

## 2015-10-16 ENCOUNTER — Other Ambulatory Visit

## 2015-10-16 ENCOUNTER — Telehealth: Payer: Self-pay | Admitting: Oncology

## 2015-10-16 ENCOUNTER — Ambulatory Visit (HOSPITAL_BASED_OUTPATIENT_CLINIC_OR_DEPARTMENT_OTHER): Admitting: Oncology

## 2015-10-16 VITALS — BP 151/86 | HR 77 | Temp 98.3°F | Resp 20 | Ht 60.0 in | Wt 250.4 lb

## 2015-10-16 DIAGNOSIS — D509 Iron deficiency anemia, unspecified: Secondary | ICD-10-CM | POA: Diagnosis not present

## 2015-10-16 DIAGNOSIS — C189 Malignant neoplasm of colon, unspecified: Secondary | ICD-10-CM

## 2015-10-16 DIAGNOSIS — C20 Malignant neoplasm of rectum: Secondary | ICD-10-CM

## 2015-10-16 NOTE — Progress Notes (Signed)
  [place] OFFICE PROGRESS NOTE   Diagnosis:  Rectal cancer.  INTERVAL HISTORY:    Jasmine Clayton returns as scheduled.  Jasmine husband recently died unexpectedly. She has noted a scab at the middle of the abdominal scar that occasionally bleeds.  Objective:  Vital signs in last 24 hours:  Blood pressure 151/86, pulse 77, temperature 98.3 F (36.8 C), temperature source Oral, resp. rate 20, height 5' (1.524 m), weight 250 lb 6.4 oz (113.581 kg), last menstrual period 11/30/2009, SpO2 100 %.    HEENT:  Neck without mass Lymphatics:  Cervical, supra-clavicular, axillary, or inguinal nodes Resp:  Lungs clear bilaterally Cardio:  Regular rate and rhythm GI:  No hepatomegaly, no mass, keloid at the colostomy and midline scars. At the mid aspect of the scar there is an area of superficial scabbing without a discrete mass. No bleeding. Vascular:  No leg edema    Lab Results:    Medications: I have reviewed the patient's current medications.  Assessment/Plan: 1. 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 She began adjuvant FOLFOX chemotherapy 07/26/2011. She completed cycle 6 on 10/07/2011. She began radiation and continuous infusion 5-fluorouracil on 10/25/2011, completed 12/01/2011. FOLFOX resumed for an additional 3 cycles beginning 12/16/2011. She completed the final cycle of chemotherapy 01/13/2012. Restaging CT's 03/08/2012-negative. Colonoscopy on 05/10/2012-negative.  Restaging CTs 03/12/2013 negative.   Restaging CTs 04/03/2014 negative.  Negative surveillance colonoscopy 07/08/2014 2. 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 3. Open abdominal wound. The wound has healed. 4. History of a DVT while pregnant in 1979. 5. Normal preoperative CEA (1.0 on 05/14/2011). 6. Family history of cancer  including endometrial cancer in Jasmine Clayton and colon cancer in Jasmine Clayton. The tumor was submitted for microsatellite instability testing. The tumor showed no evidence of microsatellite instability by PCR. 7. Microcytic anemia. Ferritin was normal at 78 on 09/23/2011.  8. History of thrombocytopenia secondary to chemotherapy. The platelet count was in normal range on 10/16/2013 9. Right knee discomfort. She is followed by orthopedics. 10. Status post colostomy reversal and Port-A-Cath removal 07/11/2012. 11. 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 We will follow-up on the CEA from today. She will return for an office visit and CEA in 6 months.  I recommended she see Dr. Donne Hazel for persistent bleeding or opening of the midline scar.  Jasmine Coder, MD  10/16/2015  3:13 PM

## 2015-10-16 NOTE — Telephone Encounter (Signed)
per pof to sch pt appt-gave pt copy of avs °

## 2015-10-17 LAB — CEA (PARALLEL TESTING): CEA: 1 ng/mL

## 2015-10-17 LAB — CEA: CEA: 2 ng/mL (ref 0.0–4.7)

## 2015-10-20 ENCOUNTER — Telehealth: Payer: Self-pay | Admitting: *Deleted

## 2015-10-20 NOTE — Telephone Encounter (Signed)
-----   Message from [name], MD sent at 10/17/2015  4:57 PM EDT ----- Please call patient, Jasmine Clayton is normal

## 2015-11-18 ENCOUNTER — Other Ambulatory Visit: Payer: Self-pay | Admitting: Family

## 2015-11-19 NOTE — Telephone Encounter (Signed)
90 day supply of HCTZ sent to pharmacy. Pt is due for 6 month follow up with PCP. Please call her to schedule appt soon.  Thanks!

## 2015-11-20 NOTE — Telephone Encounter (Signed)
Pt has been scheduled.  °

## 2015-11-28 ENCOUNTER — Ambulatory Visit (INDEPENDENT_AMBULATORY_CARE_PROVIDER_SITE_OTHER): Admitting: Family

## 2015-11-28 ENCOUNTER — Encounter: Payer: Self-pay | Admitting: Family

## 2015-11-28 VITALS — BP 144/80 | HR 74 | Temp 98.0°F | Resp 16 | Ht 60.0 in | Wt 255.6 lb

## 2015-11-28 DIAGNOSIS — G4700 Insomnia, unspecified: Secondary | ICD-10-CM | POA: Insufficient documentation

## 2015-11-28 DIAGNOSIS — I10 Essential (primary) hypertension: Secondary | ICD-10-CM

## 2015-11-28 DIAGNOSIS — R739 Hyperglycemia, unspecified: Secondary | ICD-10-CM

## 2015-11-28 LAB — BASIC METABOLIC PANEL
BUN: 11 mg/dL (ref 6–23)
CHLORIDE: 100 meq/L (ref 96–112)
CO2: 31 meq/L (ref 19–32)
Calcium: 9.5 mg/dL (ref 8.4–10.5)
Creatinine, Ser: 0.98 mg/dL (ref 0.40–1.20)
GFR: 73.54 mL/min (ref >60.00)
Glucose, Bld: 87 mg/dL (ref 70–99)
POTASSIUM: 3.8 meq/L (ref 3.5–5.1)
Sodium: 137 mEq/L (ref 135–145)

## 2015-11-28 LAB — HEMOGLOBIN A1C: HEMOGLOBIN A1C: 5.9 % (ref 4.6–6.5)

## 2015-11-28 MED ORDER — ZOLPIDEM TARTRATE 5 MG PO TABS: 5.0000 mg | ORAL_TABLET | Freq: Every evening | ORAL | Status: DC | PRN

## 2015-11-28 NOTE — Assessment & Plan Note (Signed)
Obtain follow up a1c.  

## 2015-11-28 NOTE — Progress Notes (Signed)
Subjective:    Patient ID: Jasmine Clayton, female    DOB: 09-02-1950, 65 y.o.   MRN: [id number]  HPI  Jasmine Clayton is a 65 yr old female who presents today for follow up.  1) HTN- She is currently maintained on hctz.  BP Readings from Last 3 Encounters:  11/28/15 144/80  10/16/15 151/86  09/04/15 147/77   2) Hyperglycemcia- Weight is up 5 pounds since her last visit.   Lab Results  Component Value Date   HGBA1C 5.7 05/26/2015   3)  Insomnia/Grief reaction- pt reports that her husband passed away to months ago. Reports issues with insomnia since he passed.  Reports that she reports not sleeping well.     Review of Systems  Respiratory: Negative for cough.   Cardiovascular: Negative for chest pain and leg swelling.      see HPI  Past Medical History  Diagnosis Date  . DVT (deep vein thrombosis) in pregnancy 1979    during pregnancy. lower extremity  . Stress incontinence, female     not current  . Morbid obesity ([place])   . 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 History of chemotherapy      cycle 5 Folfox completed 09/23/11  . History of radiation therapy 10/25/11 -12/01/11    pelvis/presacral region  . Degenerative joint disease     right knee  . Corneal ulcers and infections 07/04/12  . Adenocarcinoma of colon ([place]) 04/12/2011    "rectal-colon"  . Family history of colon cancer   . Family history of uterine cancer      Social History   Social History  . Marital Status: Married    Spouse Name: N/A  . Number of Children: 1  . Years of Education: N/A   Occupational History  . 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 Smoking status: Never Smoker   . Smokeless tobacco: Never Used  . Alcohol Use: 0.0 oz/week    0 Standard drinks or equivalent per week     Comment: occasional  . Drug Use: No  . Sexual Activity: Not on file   Other Topics Concern  . Not on  file   Social History Narrative   Regular exercise:  No   Caffeine Use:  1 cup coffee daily   Married- lives with husband.  Daughter, son-in-law and her 2 children- they recently moved out   Completed the 12th grade, and some college.   Does not work.    Past Surgical History  Procedure Laterality Date  . Total abdominal hysterectomy w/ bilateral salpingoophorectomy  11/30/09    BSO  . Total hip arthroplasty  2002    left hip  . Mass excision  2011    "mass removed from ovaries"  . Cesarean section    . Keloid excision  1982  . Joint replacement  2002    hip replacement  . Colostomy  05/27/11  . Portacath placement  07/26/11    right portacath  . Abdominal hysterectomy  3 years ago  . Colon surgery      LAR, reoperation for necrotic ileostomy, transverse colostomy  . Breast cyst excision  1978  . Colostomy takedown  07/11/2012    Procedure: COLOSTOMY TAKEDOWN;  Surgeon: Rolm Bookbinder, MD;  Location: [facility];  Service: General;  Laterality: N/A;  colostomy takedown  . Port-a-cath removal  07/11/2012  Procedure: REMOVAL PORT-A-CATH;  Surgeon: Rolm Bookbinder, MD;  Location: [facility];  Service: General;  Laterality: N/A;  removal portacath    Family History  Problem Relation Age of Onset  . Uterine cancer Mother 74    deceased 45  . Diabetes Father   . Colon cancer Sister 51    currently 70  . Alcoholism Brother     No Known Allergies  Current Outpatient Prescriptions on File Prior to Visit  Medication Sig Dispense Refill  . aspirin 81 MG tablet Take 81 mg by mouth daily.      . B Complex Vitamins (B-COMPLEX/B-12 PO) Take 1 tablet by mouth daily.     . Calcium Carb-Cholecalciferol (CALCIUM-VITAMIN D3) 600-500 MG-UNIT CAPS Take 1 tablet by mouth daily.    . hydrochlorothiazide (HYDRODIURIL) 25 MG tablet TAKE 1 TABLET(25 MG) BY MOUTH DAILY 90 tablet 0  . Omega-3 Fatty Acids (FISH OIL) 1200 MG CAPS Take 1 capsule by mouth daily.    . potassium chloride SA  (K-DUR,KLOR-CON) 20 MEQ tablet TAKE 1 TABLET(20 MEQ) BY MOUTH DAILY 90 tablet 1   Current Facility-Administered Medications on File Prior to Visit  Medication Dose Route Frequency Provider Last Rate Last Dose  . sodium chloride 0.9 % injection 10 mL  10 mL Intravenous PRN [name], MD   10 mL at 05/25/12 1153    Ht 5' (1.524 m)  Wt 255 lb 9.6 oz (115.939 kg)  BMI 49.92 kg/m2  LMP 11/30/2009    Objective:   Physical Exam  Constitutional: She is oriented to person, place, and time. She appears well-developed and well-nourished.  Neurological: She is alert and oriented to person, place, and time.  Psychiatric: Her behavior is normal. Judgment and thought content normal.  tearful          Assessment & Plan:

## 2015-11-28 NOTE — Patient Instructions (Addendum)
Begin Ambien one tablet prior to bed for sleep.  Follow up in 1 month.

## 2015-11-28 NOTE — Progress Notes (Signed)
Pre visit review using our clinic review tool, if applicable. No additional management support is needed unless otherwise documented below in the visit note. 

## 2015-11-28 NOTE — Assessment & Plan Note (Signed)
BP Readings from Last 3 Encounters:  11/28/15 144/80  10/16/15 151/86  09/04/15 147/77   BP is improved. Continue hctz, obtain bmet.

## 2015-11-28 NOTE — Assessment & Plan Note (Signed)
Trial of ambien. Pt given number to call to [facility] to establish with a therapist for grief counseling.

## 2015-12-23 ENCOUNTER — Telehealth: Payer: Self-pay | Admitting: Family

## 2015-12-30 ENCOUNTER — Ambulatory Visit: Admitting: Family

## 2016-01-21 ENCOUNTER — Encounter: Payer: Self-pay | Admitting: Family

## 2016-01-21 ENCOUNTER — Ambulatory Visit (INDEPENDENT_AMBULATORY_CARE_PROVIDER_SITE_OTHER): Admitting: Family

## 2016-01-21 VITALS — BP 127/77 | HR 66 | Temp 98.5°F | Resp 18 | Ht 60.0 in | Wt 250.6 lb

## 2016-01-21 DIAGNOSIS — G4700 Insomnia, unspecified: Secondary | ICD-10-CM

## 2016-01-21 MED ORDER — ZOLPIDEM TARTRATE 5 MG PO TABS: 5.0000 mg | ORAL_TABLET | Freq: Every evening | ORAL | Status: DC | PRN

## 2016-01-21 MED ORDER — POTASSIUM CHLORIDE CRYS ER 20 MEQ PO TBCR: EXTENDED_RELEASE_TABLET | ORAL | Status: DC

## 2016-01-21 NOTE — Progress Notes (Signed)
Pre visit review using our clinic review tool, if applicable. No additional management support is needed unless otherwise documented below in the visit note. 

## 2016-01-21 NOTE — Progress Notes (Signed)
Subjective:    Patient ID: Jasmine Clayton, female    DOB: [birth year], 65 y.o.   MRN: [id number]  HPI  Jasmine Clayton is a 65 yr old female who presents today for follow up of her insomnia.  Her husband passed away about 4 months ago and following his death she has had issues with insomnia.  Last visit she was given trial of ambien and was encouraged to establish with Behavioral health for grief counseling. Reports that the first few days she slept better, then began having sleep issues again.  Pt lays down to go to bed 11:30-12:00.  May watch part of the news in her bed.  Reports that she generally falls asleep around midnight. She wakes up between 5-6, usually she sets an alarm for 6. Not exercising regularly. 2 sodas a day, 1 tea a day (around 3-4 PM). She did not establish with counselor, has been too busy.   Review of Systems See HPI  Past Medical History  Diagnosis Date  . DVT (deep vein thrombosis) in pregnancy 1979    during pregnancy. lower extremity  . Stress incontinence, female     not current  . Morbid obesity ([place])   . 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 History of chemotherapy      cycle 5 Folfox completed 09/23/11  . History of radiation therapy 10/25/11 -12/01/11    pelvis/presacral region  . Degenerative joint disease     right knee  . Corneal ulcers and infections 07/04/12  . Adenocarcinoma of colon ([place]) 04/12/2011    "rectal-colon"  . Family history of colon cancer   . Family history of uterine cancer      Social History   Social History  . Marital Status: Married    Spouse Name: N/A  . Number of Children: 1  . Years of Education: N/A   Occupational History  . 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 Smoking status: Never Smoker   . Smokeless tobacco: Never Used  . Alcohol Use: 0.0 oz/week    0 Standard drinks or equivalent per week     Comment:  occasional  . Drug Use: No  . Sexual Activity: Not on file   Other Topics Concern  . Not on file   Social History Narrative   Regular exercise:  No   Caffeine Use:  1 cup coffee daily   Married- lives with husband.  Daughter, son-in-law and her 2 children- they recently moved out   Completed the 12th grade, and some college.   Does not work.    Past Surgical History  Procedure Laterality Date  . Total abdominal hysterectomy w/ bilateral salpingoophorectomy  11/30/09    BSO  . Total hip arthroplasty  2002    left hip  . Mass excision  2011    "mass removed from ovaries"  . Cesarean section    . Keloid excision  1982  . Joint replacement  2002    hip replacement  . Colostomy  05/27/11  . Portacath placement  07/26/11    right portacath  . Abdominal hysterectomy  3 years ago  . Colon surgery      LAR, reoperation for necrotic ileostomy, transverse colostomy  . Breast cyst excision  1978  . Colostomy takedown  07/11/2012    Procedure: COLOSTOMY TAKEDOWN;  Surgeon: Rolm Bookbinder, MD;  Location: [facility];  Service: General;  Laterality: N/A;  colostomy takedown  . Port-a-cath removal  07/11/2012    Procedure: REMOVAL PORT-A-CATH;  Surgeon: Rolm Bookbinder, MD;  [facility]: [facility];  Service: General;  Laterality: N/A;  removal portacath    Family History  Problem Relation Age of Onset  . Uterine cancer Mother 63    deceased 76  . Diabetes Father   . Colon cancer Sister 49    currently 105  . Alcoholism Brother     No Known Allergies  Current Outpatient Prescriptions on File Prior to Visit  Medication Sig Dispense Refill  . aspirin 81 MG tablet Take 81 mg by mouth daily.      . B Complex Vitamins (B-COMPLEX/B-12 PO) Take 1 tablet by mouth daily.     . Calcium Carb-Cholecalciferol (CALCIUM-VITAMIN D3) 600-500 MG-UNIT CAPS Take 1 tablet by mouth daily.    . hydrochlorothiazide (HYDRODIURIL) 25 MG tablet TAKE 1 TABLET(25 MG) BY MOUTH DAILY 90 tablet 0  . Omega-3 Fatty  Acids (FISH OIL) 1200 MG CAPS Take 1 capsule by mouth daily.    . potassium chloride SA (K-DUR,KLOR-CON) 20 MEQ tablet TAKE 1 TABLET(20 MEQ) BY MOUTH DAILY 90 tablet 1  . zolpidem (AMBIEN) 5 MG tablet Take 1 tablet (5 mg total) by mouth at bedtime as needed for sleep. 30 tablet 0   Current Facility-Administered Medications on File Prior to Visit  Medication Dose Route Frequency Provider Last Rate Last Dose  . sodium chloride 0.9 % injection 10 mL  10 mL Intravenous PRN [name], MD   10 mL at 05/25/12 1153    BP 127/77 mmHg  Pulse 66  Temp(Src) 98.5 F (36.9 C) (Oral)  Resp 18  Ht 5' (1.524 m)  Wt 250 lb 9.6 oz (113.671 kg)  BMI 48.94 kg/m2  SpO2 100%  LMP 11/30/2009       Objective:   Physical Exam  Constitutional: She is oriented to person, place, and time. She appears well-developed and well-nourished. No distress.  Neurological: She is alert and oriented to person, place, and time.  Psychiatric: She has a normal mood and affect. Her behavior is normal. Judgment and thought content normal.          Assessment & Plan:

## 2016-01-21 NOTE — Assessment & Plan Note (Signed)
Discussed improving sleep hygiene.  Pt counseled x 20 minutes. Advised as follows: Do not drink tea or caffeine after 12 PM. Do not watch TV in your bedroom. Go to bed at 10 PM. Continue ambien as needed.

## 2016-01-21 NOTE — Patient Instructions (Signed)
Do not drink tea or caffeine after 12 PM. Do not watch TV in your bedroom. Go to bed at 10 PM. Continue ambien as needed.

## 2016-02-18 NOTE — Telephone Encounter (Signed)
Complete

## 2016-04-21 ENCOUNTER — Ambulatory Visit: Admitting: Family

## 2016-04-23 ENCOUNTER — Encounter: Payer: Self-pay | Admitting: Family

## 2016-04-23 ENCOUNTER — Ambulatory Visit (INDEPENDENT_AMBULATORY_CARE_PROVIDER_SITE_OTHER): Admitting: Family

## 2016-04-23 VITALS — HR 69 | Temp 98.2°F | Resp 16 | Ht 60.0 in | Wt 247.0 lb

## 2016-04-23 DIAGNOSIS — I10 Essential (primary) hypertension: Secondary | ICD-10-CM | POA: Diagnosis not present

## 2016-04-23 DIAGNOSIS — Z23 Encounter for immunization: Secondary | ICD-10-CM | POA: Diagnosis not present

## 2016-04-23 DIAGNOSIS — R739 Hyperglycemia, unspecified: Secondary | ICD-10-CM

## 2016-04-23 DIAGNOSIS — G4700 Insomnia, unspecified: Secondary | ICD-10-CM

## 2016-04-23 LAB — BASIC METABOLIC PANEL
BUN: 17 mg/dL (ref 6–23)
CHLORIDE: 101 meq/L (ref 96–112)
CO2: 30 meq/L (ref 19–32)
CREATININE: 1.08 mg/dL (ref 0.40–1.20)
Calcium: 9.6 mg/dL (ref 8.4–10.5)
GFR: 65.66 mL/min (ref >60.00)
Glucose, Bld: 91 mg/dL (ref 70–99)
POTASSIUM: 3.5 meq/L (ref 3.5–5.1)
Sodium: 140 mEq/L (ref 135–145)

## 2016-04-23 LAB — HEMOGLOBIN A1C: Hgb A1c MFr Bld: 5.7 % (ref 4.6–6.5)

## 2016-04-23 MED ORDER — ZOLPIDEM TARTRATE 5 MG PO TABS: 5.0000 mg | ORAL_TABLET | Freq: Every evening | ORAL | 0 refills | Status: DC | PRN

## 2016-04-23 NOTE — Assessment & Plan Note (Signed)
Improved with use of ambien. Continue same.

## 2016-04-23 NOTE — Progress Notes (Signed)
Pre visit review using our clinic review tool, if applicable. No additional management support is needed unless otherwise documented below in the visit note. 

## 2016-04-23 NOTE — Assessment & Plan Note (Signed)
Clinically stable.  Obtain A1C.  

## 2016-04-23 NOTE — Assessment & Plan Note (Signed)
Stable on hctz.  Obtain bmet.

## 2016-04-23 NOTE — Addendum Note (Signed)
Addended by: Kelle Darting A on: 04/23/2016 12:11 PM   Modules accepted: Orders

## 2016-04-23 NOTE — Patient Instructions (Addendum)
Please complete lab work prior to leaving.   

## 2016-04-23 NOTE — Progress Notes (Signed)
Subjective:    Patient ID: Jasmine Clayton, female    DOB: Oct 22, 1950, 65 y.o.   MRN: [id number]  HPI  Jasmine Clayton is a 65 yr old female who presents today for follow up.  1) Insomnia- continues ambien 5mg . Reports that she has been sleeping well with this dose.    2) HTN- continues hctz. Denies CP or SOB.  Has missed a few doses of Kdur.    BP Readings from Last 3 Encounters:  01/21/16 127/77  11/28/15 (!) 144/80  10/16/15 (!) 151/86   3) Hyperglycemia-  Lab Results  Component Value Date   HGBA1C 5.9 11/28/2015   She continues to mourn the loss of her husband who passed in February. She has a lot of support from her family and church friends.   Review of Systems    see HPI  Past Medical History:  Diagnosis Date  . Adenocarcinoma of colon ([place]) 04/12/2011   "rectal-colon"  . Corneal ulcers and infections 07/04/12  . Degenerative joint disease    right knee  . DVT (deep vein thrombosis) in pregnancy 1979   during pregnancy. lower extremity  . Family history of colon cancer   . Family history of uterine cancer   . History of chemotherapy     cycle 5 Folfox completed 09/23/11  . History of radiation therapy 10/25/11 -12/01/11   pelvis/presacral region  . Morbid obesity ([place])   . 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 Stress incontinence, female    not current     Social History   Social History  . Marital status: Married    Spouse name: N/A  . Number of children: 1  . Years of education: N/A   Occupational History  . 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 Smoking status: Never Smoker  . Smokeless tobacco: Never Used  . Alcohol use 0.0 oz/week     Comment: occasional  . Drug use: No  . Sexual activity: Not on file   Other Topics Concern  . Not on file   Social History Narrative   Regular exercise:  No   Caffeine Use:  1 cup coffee daily   Married-  lives with husband.  Daughter, son-in-law and her 2 children- they recently moved out   Completed the 12th grade, and some college.   Does not work.    Past Surgical History:  Procedure Laterality Date  . ABDOMINAL HYSTERECTOMY  3 years ago  . BREAST CYST EXCISION  1978  . CESAREAN SECTION    . COLON SURGERY     LAR, reoperation for necrotic ileostomy, transverse colostomy  . COLOSTOMY  05/27/11  . COLOSTOMY TAKEDOWN  07/11/2012   Procedure: COLOSTOMY TAKEDOWN;  Surgeon: Rolm Bookbinder, MD;  Location: [facility];  Service: General;  Laterality: N/A;  colostomy takedown  . JOINT REPLACEMENT  2002   hip replacement  . [place]  . MASS EXCISION  2011   "mass removed from ovaries"  . PORT-A-CATH REMOVAL  07/11/2012   Procedure: REMOVAL PORT-A-CATH;  Surgeon: Rolm Bookbinder, MD;  Location: [facility];  Service: General;  Laterality: N/A;  removal portacath  . PORTACATH PLACEMENT  07/26/11   right portacath  . TOTAL ABDOMINAL HYSTERECTOMY W/ BILATERAL SALPINGOOPHORECTOMY  11/30/09   BSO  . TOTAL HIP ARTHROPLASTY  2002   left hip    Family History  Problem Relation Age of Onset  . Uterine cancer Mother 25    deceased 30  . Diabetes Father   . Colon cancer Sister 74    currently 12  . Alcoholism Brother     No Known Allergies  Current Outpatient Prescriptions on File Prior to Visit  Medication Sig Dispense Refill  . aspirin 81 MG tablet Take 81 mg by mouth daily.      . B Complex Vitamins (B-COMPLEX/B-12 PO) Take 1 tablet by mouth daily.     . Calcium Carb-Cholecalciferol (CALCIUM-VITAMIN D3) 600-500 MG-UNIT CAPS Take 1 tablet by mouth daily.    . hydrochlorothiazide (HYDRODIURIL) 25 MG tablet TAKE 1 TABLET(25 MG) BY MOUTH DAILY 90 tablet 0  . Omega-3 Fatty Acids (FISH OIL) 1200 MG CAPS Take 1 capsule by mouth daily.    . potassium chloride SA (K-DUR,KLOR-CON) 20 MEQ tablet TAKE 1 TABLET(20 MEQ) BY MOUTH DAILY 90 tablet 1  . zolpidem (AMBIEN) 5 MG tablet Take 1  tablet (5 mg total) by mouth at bedtime as needed for sleep. 30 tablet 0   Current Facility-Administered Medications on File Prior to Visit  Medication Dose Route Frequency Provider Last Rate Last Dose  . sodium chloride 0.9 % injection 10 mL  10 mL Intravenous PRN [name], MD   10 mL at 05/25/12 1153    Pulse 69   Temp 98.2 F (36.8 C) (Oral)   Resp 16   Ht 5' (1.524 m)   Wt 247 lb (112 kg)   LMP 11/30/2009   SpO2 100% Comment: room air  BMI 48.24 kg/m    Objective:   Physical Exam  Constitutional: She is oriented to person, place, and time. She appears well-developed and well-nourished.  HENT:  Head: Normocephalic and atraumatic.  Cardiovascular: Normal rate, regular rhythm and normal heart sounds.   No murmur heard. Pulmonary/Chest: Effort normal and breath sounds normal. No respiratory distress. She has no wheezes.  Musculoskeletal: She exhibits no edema.  Neurological: She is alert and oriented to person, place, and time.  Skin: Skin is warm and dry.  Psychiatric: She has a normal mood and affect. Her behavior is normal. Judgment and thought content normal.          Assessment & Plan:  Flu shot today.

## 2016-05-03 ENCOUNTER — Other Ambulatory Visit: Payer: Self-pay | Admitting: Family

## 2016-05-03 DIAGNOSIS — Z1231 Encounter for screening mammogram for malignant neoplasm of breast: Secondary | ICD-10-CM

## 2016-05-12 ENCOUNTER — Ambulatory Visit
Admission: RE | Admit: 2016-05-12 | Discharge: 2016-05-12 | Disposition: A | Discharge status: Home / Self Care | Source: Ambulatory Visit | Attending: Family | Admitting: Family

## 2016-05-12 DIAGNOSIS — Z1231 Encounter for screening mammogram for malignant neoplasm of breast: Secondary | ICD-10-CM

## 2016-05-17 ENCOUNTER — Ambulatory Visit (HOSPITAL_BASED_OUTPATIENT_CLINIC_OR_DEPARTMENT_OTHER): Admitting: Nurse Practitioner

## 2016-05-17 ENCOUNTER — Other Ambulatory Visit (HOSPITAL_BASED_OUTPATIENT_CLINIC_OR_DEPARTMENT_OTHER)

## 2016-05-17 ENCOUNTER — Telehealth: Payer: Self-pay | Admitting: Oncology

## 2016-05-17 VITALS — BP 125/66 | HR 83 | Temp 98.3°F | Resp 18 | Ht 60.0 in | Wt 248.8 lb

## 2016-05-17 DIAGNOSIS — Z8049 Family history of malignant neoplasm of other genital organs: Secondary | ICD-10-CM | POA: Diagnosis not present

## 2016-05-17 DIAGNOSIS — C20 Malignant neoplasm of rectum: Secondary | ICD-10-CM

## 2016-05-17 DIAGNOSIS — Z86718 Personal history of other venous thrombosis and embolism: Secondary | ICD-10-CM | POA: Diagnosis not present

## 2016-05-17 DIAGNOSIS — Z800 Family history of malignant neoplasm of digestive organs: Secondary | ICD-10-CM

## 2016-05-17 DIAGNOSIS — Z862 Personal history of diseases of the blood and blood-forming organs and certain disorders involving the immune mechanism: Secondary | ICD-10-CM

## 2016-05-17 DIAGNOSIS — Z85048 Personal history of other malignant neoplasm of rectum, rectosigmoid junction, and anus: Secondary | ICD-10-CM

## 2016-05-17 NOTE — Telephone Encounter (Signed)
Gave patient avs report and appointments for October 2018. Spoke with rose at [facility] re appointment with Dr. Donne Hazel - per Kalman Shan Dr. Donne Hazel is no longer seeing rectal ca patients - Per Rose message sent to Dr. Donne Hazel for review and [facility] will call patient re appointments - patient established with Dr. Donne Hazel.

## 2016-05-17 NOTE — Progress Notes (Addendum)
[facility] OFFICE PROGRESS NOTE   Diagnosis:  Rectal cancer  INTERVAL HISTORY:   Ms. Mahurin returns as scheduled. She reports a persistent scab involving the middle of the abdominal scar. She has noted recent onset of constipation at times alternating with diarrhea. She had an episode of burning/itching at the rectum and noted a small amount of blood. She tried Preparation H with improvement.  Objective:  Vital signs in last 24 hours:  Blood pressure 125/66, pulse 83, temperature 98.3 F (36.8 C), temperature source Oral, resp. rate 18, height 5' (1.524 m), weight 248 lb 12.8 oz (112.9 kg), last menstrual period 11/30/2009, SpO2 99 %.    HEENT: No thrush or ulcers. Lymphatics: No palpable cervical, supra clavicular, axillary or inguinal lymph nodes. Resp: Lungs clear bilaterally. Cardio: Regular rate and rhythm. GI: Abdomen soft and nontender. No hepatomegaly. No mass. Keloid at the colostomy and midline scars. At the mid aspect of the midline scar there are what appear to be 2 sutures. Vascular: No leg edema.   Lab Results:  Lab Results  Component Value Date   WBC 5.1 11/18/2014   HGB 14.2 11/18/2014   HCT 42.2 11/18/2014   MCV 89.6 11/18/2014   PLT 199.0 11/18/2014   NEUTROABS 3.3 11/18/2014    Imaging:  No results found.  Medications: I have reviewed the patient's current medications.  Assessment/Plan: 1. 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 She began adjuvant FOLFOX chemotherapy 07/26/2011. She completed cycle 6 on 10/07/2011. She began radiation and continuous infusion 5-fluorouracil on 10/25/2011, completed 12/01/2011. FOLFOX resumed for an additional 3 cycles beginning 12/16/2011. She completed the final cycle of chemotherapy 01/13/2012. Restaging CT's 03/08/2012-negative. Colonoscopy on  05/10/2012-negative.  Restaging CTs 03/12/2013 negative.   Restaging CTs 04/03/2014 negative.  Negative surveillance colonoscopy 07/08/2014 2. 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 3. Open abdominal wound. The wound has healed. 4. History of a DVT while pregnant in 1979. 5. Normal preoperative CEA (1.0 on 05/14/2011). 6. Family history of cancer including endometrial cancer in her mother and colon cancer in her sister. The tumor was submitted for microsatellite instability testing. The tumor showed no evidence of microsatellite instability by PCR. 7. Microcytic anemia. Ferritin was normal at 78 on 09/23/2011.  8. History of thrombocytopenia secondary to chemotherapy. The platelet count was in normal range on 10/16/2013 9. Right knee discomfort. She is followed by orthopedics. 10. Status post colostomy reversal and Port-A-Cath removal 07/11/2012. 11. 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 Mione remains in clinical remission from rectal cancer. We will follow-up on the CEA from today.   She is now 5 years out from diagnosis. We discussed annual follow-up here with a CEA versus follow-up with her PCP. She would like to continue to come to the [facility] for annual visits. She will return for a follow-up visit and CEA in one year.  We made a referral to Dr. Donne Hazel regarding the sutures at the midline scar.  We recommended she contact her gastroenterologist regarding the change in bowel habits.  Patient seen with Dr. Benay Spice.    Ned Card ANP/GNP-BC   05/17/2016  3:49 PM This was a shared visit with Ned Card. Ms. Salmon remains in clinical remission from rectal cancer. She would like to continue follow-up at the  [facility]. She will return for an office visit in one year.  She will  see Dr. Donne Hazel to evaluate the protruding sutures at the abdominal scar.  Julieanne Manson, M.D.

## 2016-05-18 ENCOUNTER — Telehealth: Payer: Self-pay | Admitting: *Deleted

## 2016-05-18 LAB — CEA: CEA: 1.9 ng/mL (ref 0.0–4.7)

## 2016-05-18 LAB — CEA (IN HOUSE-CHCC): CEA (CHCC-In House): 2.34 ng/mL (ref 0.00–5.00)

## 2016-05-18 NOTE — Telephone Encounter (Signed)
-----   Message from [name], MD sent at 05/18/2016  2:00 PM EDT ----- Please call patient, Jasmine Clayton is normal

## 2016-05-18 NOTE — Telephone Encounter (Signed)
Patient notified per Dr. Sherrill that cea is normal.  Patient appreciative of call and has no questions or concerns at this time.   

## 2016-05-20 ENCOUNTER — Telehealth: Payer: Self-pay

## 2016-05-20 NOTE — Telephone Encounter (Signed)
-----   Message from [name], MD sent at 05/18/2016  2:00 PM EDT ----- Please call patient, cea is normal

## 2016-05-20 NOTE — Telephone Encounter (Signed)
Completed by Lorriane Shire

## 2016-06-21 DIAGNOSIS — H524 Presbyopia: Secondary | ICD-10-CM | POA: Insufficient documentation

## 2016-06-21 DIAGNOSIS — H02889 Meibomian gland dysfunction of unspecified eye, unspecified eyelid: Secondary | ICD-10-CM | POA: Insufficient documentation

## 2016-06-21 DIAGNOSIS — H43393 Other vitreous opacities, bilateral: Secondary | ICD-10-CM | POA: Insufficient documentation

## 2016-06-21 DIAGNOSIS — H25813 Combined forms of age-related cataract, bilateral: Secondary | ICD-10-CM | POA: Insufficient documentation

## 2016-07-16 ENCOUNTER — Other Ambulatory Visit: Payer: Self-pay | Admitting: Family

## 2016-07-27 ENCOUNTER — Other Ambulatory Visit: Payer: Self-pay | Admitting: Nurse Practitioner

## 2016-08-25 ENCOUNTER — Telehealth: Payer: Self-pay | Admitting: Family

## 2016-08-25 ENCOUNTER — Encounter: Payer: Self-pay | Admitting: Family

## 2016-08-25 ENCOUNTER — Ambulatory Visit (INDEPENDENT_AMBULATORY_CARE_PROVIDER_SITE_OTHER): Admitting: Family

## 2016-08-25 VITALS — BP 126/68 | HR 68 | Temp 98.2°F | Resp 18 | Ht 60.5 in | Wt 255.6 lb

## 2016-08-25 DIAGNOSIS — Z0000 Encounter for general adult medical examination without abnormal findings: Secondary | ICD-10-CM

## 2016-08-25 DIAGNOSIS — E2839 Other primary ovarian failure: Secondary | ICD-10-CM

## 2016-08-25 DIAGNOSIS — F329 Major depressive disorder, single episode, unspecified: Secondary | ICD-10-CM

## 2016-08-25 DIAGNOSIS — E876 Hypokalemia: Secondary | ICD-10-CM

## 2016-08-25 DIAGNOSIS — F32A Depression, unspecified: Secondary | ICD-10-CM

## 2016-08-25 LAB — URINALYSIS, ROUTINE W REFLEX MICROSCOPIC
BILIRUBIN URINE: NEGATIVE
Ketones, ur: NEGATIVE
Leukocytes, UA: NEGATIVE
NITRITE: NEGATIVE
Specific Gravity, Urine: 1.025 (ref 1.000–1.030)
TOTAL PROTEIN, URINE-UPE24: NEGATIVE
Urine Glucose: NEGATIVE
Urobilinogen, UA: 0.2 (ref 0.0–1.0)
pH: 6 (ref 5.0–8.0)

## 2016-08-25 LAB — HEPATIC FUNCTION PANEL
ALT: 23 U/L (ref 0–35)
AST: 20 U/L (ref 0–37)
Albumin: 4.1 g/dL (ref 3.5–5.2)
Alkaline Phosphatase: 106 U/L (ref 39–117)
BILIRUBIN TOTAL: 0.5 mg/dL (ref 0.2–1.2)
Bilirubin, Direct: 0.1 mg/dL (ref 0.0–0.3)
Total Protein: 7.2 g/dL (ref 6.0–8.3)

## 2016-08-25 LAB — LIPID PANEL
CHOL/HDL RATIO: 3
CHOLESTEROL: 185 mg/dL (ref 0–200)
HDL: 53.1 mg/dL (ref >39.00)
LDL CALC: 109 mg/dL — AB (ref 0–99)
NonHDL: 132.37
TRIGLYCERIDES: 115 mg/dL (ref 0.0–149.0)
VLDL: 23 mg/dL (ref 0.0–40.0)

## 2016-08-25 LAB — CBC WITH DIFFERENTIAL/PLATELET
BASOS PCT: 0.4 % (ref 0.0–3.0)
Basophils Absolute: 0 10*3/uL (ref 0.0–0.1)
Eosinophils Absolute: 0.1 10*3/uL (ref 0.0–0.7)
Eosinophils Relative: 1.9 % (ref 0.0–5.0)
HCT: 42.3 % (ref 36.0–46.0)
HEMOGLOBIN: 14.2 g/dL (ref 12.0–15.0)
LYMPHS ABS: 1.5 10*3/uL (ref 0.7–4.0)
Lymphocytes Relative: 23.6 % (ref 12.0–46.0)
MCHC: 33.6 g/dL (ref 30.0–36.0)
MCV: 90.5 fl (ref 78.0–100.0)
MONO ABS: 0.7 10*3/uL (ref 0.1–1.0)
Monocytes Relative: 11.6 % (ref 3.0–12.0)
NEUTROS PCT: 62.5 % (ref 43.0–77.0)
Neutro Abs: 3.9 10*3/uL (ref 1.4–7.7)
Platelets: 211 10*3/uL (ref 150.0–400.0)
RBC: 4.67 Mil/uL (ref 3.87–5.11)
RDW: 14.7 % (ref 11.5–15.5)
WBC: 6.2 10*3/uL (ref 4.0–10.5)

## 2016-08-25 LAB — BASIC METABOLIC PANEL
BUN: 13 mg/dL (ref 6–23)
CHLORIDE: 102 meq/L (ref 96–112)
CO2: 33 mEq/L — ABNORMAL HIGH (ref 19–32)
CREATININE: 1.1 mg/dL (ref 0.40–1.20)
Calcium: 9.6 mg/dL (ref 8.4–10.5)
GFR: 64.21 mL/min (ref >60.00)
Glucose, Bld: 89 mg/dL (ref 70–99)
Potassium: 3.4 mEq/L — ABNORMAL LOW (ref 3.5–5.1)
Sodium: 141 mEq/L (ref 135–145)

## 2016-08-25 LAB — TSH: TSH: 3.39 u[IU]/mL (ref 0.35–4.50)

## 2016-08-25 MED ORDER — ESCITALOPRAM OXALATE 10 MG PO TABS: ORAL_TABLET | ORAL | 0 refills | Status: DC

## 2016-08-25 NOTE — Patient Instructions (Signed)
Please complete lab work prior to leaving. You will be contacted about scheduling your bone density. Please begin lexapro 10mg  1/2 tab once daily for depression. In 1 week you may increase to a full tab once daily. Continue to work on [employer], exercise and weight loss.  Eldon to schedule an appointment with one of our therapists in [phone].

## 2016-08-25 NOTE — Telephone Encounter (Signed)
Has she missed any doses of kdur?  K is a bit low. Take 2 tabs of kdur today, then one tab once daily. Repeat bmet in 1 week, dx hypokalemia.  Other lab work looks good.

## 2016-08-25 NOTE — Progress Notes (Signed)
Subjective:    Patient ID: Jasmine Clayton, female    DOB: 05-12-1951, 66 y.o.   MRN: [id number]  HPI  Jasmine Clayton is a 66 yr old female who presents today for complete physical.    Immunizations:  Tetanus, flu up to date. Had pneumonvax.  Prevnar due this summer when she turns 72. She had zostavax 7/14 Diet: reports poor diet Exercise: plans to start exercising with a church friend (water aerobics) Colonoscopy: (flex sig 05/17/16) Dexa: 04/03/12 Pap Smear:  hysterectomy Mammogram: 05/14/16  Depression- notes that she is socially isolating.  She reports that she sleeps fine.  Notes that her family has noted depressed mood.  Notes that she has not been cleaning her house. "my house looks like a hoarder house."  Reports that her house is usually neat and tidy. She is coming up on the anniversary of her husband's death.  Reports that she is short with family members and that is "just not me."    Wt Readings from Last 3 Encounters:  08/25/16 255 lb 9.6 oz (115.9 kg)  05/17/16 248 lb 12.8 oz (112.9 kg)  04/23/16 247 lb (112 kg)    Review of Systems  Constitutional: Negative for unexpected weight change.  HENT: Negative for rhinorrhea.   Eyes: Negative for visual disturbance.  Respiratory: Negative for cough.   Cardiovascular:       Reports + ankle swelling  Gastrointestinal: Negative for blood in stool and diarrhea.       Occasional constipation  Genitourinary: Negative for dysuria and frequency.  Musculoskeletal:       Notes some chronic right knee and back pain  Neurological: Negative for headaches.  Hematological: Negative for adenopathy.  Psychiatric/Behavioral:       See HPI  Denies anxiety   Past Medical History:  Diagnosis Date  . Adenocarcinoma of colon ([place]) 04/12/2011   "rectal-colon"  . Corneal ulcers and infections 07/04/12  . Degenerative joint disease    right knee  . DVT (deep vein thrombosis) in pregnancy ([place]) 1979   during pregnancy. lower extremity    . Family history of colon cancer   . Family history of uterine cancer   . History of chemotherapy     cycle 5 Folfox completed 09/23/11  . History of radiation therapy 10/25/11 -12/01/11   pelvis/presacral region  . Morbid obesity ([place])   . 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 Stress incontinence, female    not current     Social History   Social History  . Marital status: Married    Spouse name: N/A  . Number of children: 1  . Years of education: N/A   Occupational History  . 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 Smoking status: Never Smoker  . Smokeless tobacco: Never Used  . Alcohol use 0.0 oz/week     Comment: occasional 3 x a week (1 drink)  . Drug use: No  . Sexual activity: Not on file   Other Topics Concern  . Not on file   Social History Narrative   Regular exercise:  No   Caffeine Use:  1 cup coffee daily   Husband passed 2/17   Daughter, son-in-law and her 2 children- they recently moved out   Completed the 12th grade, and some college.   Does not work.    Past Surgical History:  Procedure Laterality Date  .  ABDOMINAL HYSTERECTOMY  3 years ago  . BREAST CYST EXCISION  1978  . CESAREAN SECTION    . COLON SURGERY     LAR, reoperation for necrotic ileostomy, transverse colostomy  . COLOSTOMY  05/27/11  . COLOSTOMY TAKEDOWN  07/11/2012   Procedure: COLOSTOMY TAKEDOWN;  Surgeon: Rolm Bookbinder, MD;  Location: [facility];  Service: General;  Laterality: N/A;  colostomy takedown  . JOINT REPLACEMENT  2002   hip replacement  . [place]  . MASS EXCISION  2011   "mass removed from ovaries"  . PORT-A-CATH REMOVAL  07/11/2012   Procedure: REMOVAL PORT-A-CATH;  Surgeon: Rolm Bookbinder, MD;  Location: [facility];  Service: General;  Laterality: N/A;  removal portacath  . PORTACATH PLACEMENT  07/26/11   right portacath  . TOTAL ABDOMINAL HYSTERECTOMY  W/ BILATERAL SALPINGOOPHORECTOMY  11/30/09   BSO  . TOTAL HIP ARTHROPLASTY  2002   left hip    Family History  Problem Relation Age of Onset  . Uterine cancer Mother 30    deceased 51  . Diabetes Father   . Colon cancer Sister 9    currently 70  . Alcoholism Brother     No Known Allergies  Current Outpatient Prescriptions on File Prior to Visit  Medication Sig Dispense Refill  . aspirin 81 MG tablet Take 81 mg by mouth daily.      . B Complex Vitamins (B-COMPLEX/B-12 PO) Take 1 tablet by mouth daily.     . Calcium Carb-Cholecalciferol (CALCIUM-VITAMIN D3) 600-500 MG-UNIT CAPS Take 1 tablet by mouth daily.    . hydrochlorothiazide (HYDRODIURIL) 25 MG tablet TAKE 1 TABLET(25 MG) BY MOUTH DAILY 90 tablet 0  . Omega-3 Fatty Acids (FISH OIL) 1200 MG CAPS Take 1 capsule by mouth daily.    . potassium chloride SA (K-DUR,KLOR-CON) 20 MEQ tablet TAKE 1 TABLET BY MOUTH EVERY DAY 90 tablet 1  . zolpidem (AMBIEN) 5 MG tablet Take 1 tablet (5 mg total) by mouth at bedtime as needed for sleep. 30 tablet 0   Current Facility-Administered Medications on File Prior to Visit  Medication Dose Route Frequency Provider Last Rate Last Dose  . sodium chloride 0.9 % injection 10 mL  10 mL Intravenous PRN Jasmine Pier, MD   10 mL at 05/25/12 1153    BP 126/68 (BP Location: Right Arm, Cuff Size: Large)   Pulse 68   Temp 98.2 F (36.8 C) (Oral)   Resp 18   Ht 5' 0.5" (1.537 m)   Wt 255 lb 9.6 oz (115.9 kg)   LMP 11/30/2009   SpO2 100% Comment: room air  BMI 49.10 kg/m       Objective:   Physical Exam Physical Exam  Constitutional: Morbidly obese AA female. She is oriented to person, place, and time. She appears well-developed and well-nourished. No distress.  HENT:  Head: Normocephalic and atraumatic.  Right Ear: Tympanic membrane and ear canal normal.  Left Ear: Tympanic membrane and ear canal normal.  Mouth/Throat: Oropharynx is clear and moist.  Eyes: Pupils are equal, round, and  reactive to light. No scleral icterus.  Neck: Normal range of motion. No thyromegaly present.  Cardiovascular: Normal rate and regular rhythm.   No murmur heard. Pulmonary/Chest: Effort normal and breath sounds normal. No respiratory distress. He has no wheezes. She has no rales. She exhibits no tenderness.  Abdominal: Soft. Bowel sounds are normal. She exhibits no distension and no mass. There is no tenderness. There is no rebound and  no guarding.  Musculoskeletal: She exhibits no edema.  Lymphadenopathy:    She has no cervical adenopathy.  Neurological: She is alert and oriented to person, place, and time. She has normal patellar reflexes. She exhibits normal muscle tone. Coordination normal.  Skin: Skin is warm and dry. Keloid scars noted on right breast and abdomen Psychiatric: She has a flat affect, she is tearful. Her behavior is normal. Judgment and thought content normal.  Breasts: Examined lying Right: Without masses, retractions, discharge or axillary adenopathy.  Left: Without masses, retractions, discharge or axillary adenopathy.         Assessment & Plan:    Preventative Care-  Discussed healthy diet, exercise.  Obtain routine lab work.  Immunizations reviewed and up to date. Will refer for routine dexa.  Also, will complete routine lab work.  EKG is personally reviewed and compared to previous EKG- notes NSR without acute changes. Some artifact is noted.      Assessment & Plan:

## 2016-08-25 NOTE — Assessment & Plan Note (Signed)
Uncontrolled.  Advised pt to schedule an appointment with a therapist. Will begin lexapro 10mg  once daily. I instructed pt to start 1/2 tablet once daily for 1 week and then increase to a full tablet once daily on week two as tolerated.  We discussed common side effects.  Also discussed rare but serious side effect of suicide ideation.  She is instructed to discontinue medication go directly to ED if this occurs.  Pt verbalizes understanding.  Plan follow up in 1 month to evaluate progress.

## 2016-08-25 NOTE — Progress Notes (Signed)
Pre visit review using our clinic review tool, if applicable. No additional management support is needed unless otherwise documented below in the visit note. 

## 2016-08-26 NOTE — Telephone Encounter (Signed)
Pt notified and made aware. She said she had missed a few doses.  She is in agreement with plan. Lab appt scheduled.  Future lab ordered.

## 2016-08-30 ENCOUNTER — Ambulatory Visit (HOSPITAL_BASED_OUTPATIENT_CLINIC_OR_DEPARTMENT_OTHER)
Admission: RE | Admit: 2016-08-30 | Discharge: 2016-08-30 | Disposition: A | Discharge status: Home / Self Care | Source: Ambulatory Visit | Attending: Family | Admitting: Family

## 2016-08-30 DIAGNOSIS — E2839 Other primary ovarian failure: Secondary | ICD-10-CM | POA: Diagnosis present

## 2016-08-30 DIAGNOSIS — Z96642 Presence of left artificial hip joint: Secondary | ICD-10-CM | POA: Diagnosis not present

## 2016-08-30 DIAGNOSIS — Z780 Asymptomatic menopausal state: Secondary | ICD-10-CM | POA: Insufficient documentation

## 2016-08-30 DIAGNOSIS — Z0000 Encounter for general adult medical examination without abnormal findings: Secondary | ICD-10-CM | POA: Diagnosis not present

## 2016-08-30 DIAGNOSIS — M85851 Other specified disorders of bone density and structure, right thigh: Secondary | ICD-10-CM | POA: Diagnosis not present

## 2016-09-02 ENCOUNTER — Encounter: Payer: Self-pay | Admitting: Family

## 2016-09-02 ENCOUNTER — Other Ambulatory Visit (INDEPENDENT_AMBULATORY_CARE_PROVIDER_SITE_OTHER)

## 2016-09-02 ENCOUNTER — Other Ambulatory Visit: Payer: Self-pay | Admitting: Family

## 2016-09-02 DIAGNOSIS — M8580 Other specified disorders of bone density and structure, unspecified site: Secondary | ICD-10-CM | POA: Insufficient documentation

## 2016-09-02 DIAGNOSIS — E876 Hypokalemia: Secondary | ICD-10-CM | POA: Diagnosis not present

## 2016-09-02 LAB — BASIC METABOLIC PANEL
BUN: 18 mg/dL (ref 6–23)
CO2: 29 meq/L (ref 19–32)
Calcium: 9.5 mg/dL (ref 8.4–10.5)
Chloride: 103 mEq/L (ref 96–112)
Creatinine, Ser: 1.12 mg/dL (ref 0.40–1.20)
GFR: 62.89 mL/min (ref >60.00)
Glucose, Bld: 97 mg/dL (ref 70–99)
Potassium: 4 mEq/L (ref 3.5–5.1)
SODIUM: 139 meq/L (ref 135–145)

## 2016-09-02 MED ORDER — CALCIUM-VITAMIN D3 600-500 MG-UNIT PO CAPS: 1.0000 | ORAL_CAPSULE | Freq: Two times a day (BID) | ORAL | 0 refills | Status: AC

## 2016-09-10 ENCOUNTER — Encounter: Payer: Self-pay | Admitting: Family

## 2016-09-12 ENCOUNTER — Encounter: Payer: Self-pay | Admitting: Family

## 2016-09-12 NOTE — Progress Notes (Signed)
See letter.

## 2016-09-22 ENCOUNTER — Ambulatory Visit (INDEPENDENT_AMBULATORY_CARE_PROVIDER_SITE_OTHER): Admitting: Family

## 2016-09-22 ENCOUNTER — Encounter: Payer: Self-pay | Admitting: Family

## 2016-09-22 DIAGNOSIS — F32A Depression, unspecified: Secondary | ICD-10-CM

## 2016-09-22 DIAGNOSIS — F329 Major depressive disorder, single episode, unspecified: Secondary | ICD-10-CM | POA: Diagnosis not present

## 2016-09-22 MED ORDER — ESCITALOPRAM OXALATE 20 MG PO TABS
20.0000 mg | ORAL_TABLET | Freq: Every day | ORAL | 1 refills | Status: DC
Start: 2016-09-22 — End: 2017-02-16

## 2016-09-22 NOTE — Progress Notes (Signed)
Pre visit review using our clinic review tool, if applicable. No additional management support is needed unless otherwise documented below in the visit note. 

## 2016-09-22 NOTE — Assessment & Plan Note (Signed)
Improving.  I thinks she still has a ways to go. I have asked her to keep her upcoming appointment with a therapist and will also increase her lexapro to 20 mg once daily. We discussed focusing on one room in her house each week to organize and clean.

## 2016-09-22 NOTE — Patient Instructions (Signed)
Please increase your lexapro from 10mg  to 20mg  once daily.

## 2016-09-22 NOTE — Progress Notes (Signed)
Subjective:    Patient ID: Jasmine Clayton, female    DOB: 1950-10-07, 66 y.o.   MRN: [id number]  HPI  Jasmine Clayton is a 66 yr old female who presents today for follow up. Last visit she noted worsening depression, social isolation and hoarding in her home. Noted that she was being short with family members and was coming up on the anniversary of her husband's death. Notes that she has been less short tempered.  Still not socializing a lot.  Has not been able to keep up with her house.  Reports that she is sleeping ok. Denies side effects from the lexapro. Notes that she is less tearful since starting lexapro.  She is scheduled to begin seeing a therapist on 10/07/16.    Review of Systems See HPI  Past Medical History:  Diagnosis Date  . Adenocarcinoma of colon ([place]) 04/12/2011   "rectal-colon"  . Corneal ulcers and infections 07/04/12  . Degenerative joint disease    right knee  . DVT (deep vein thrombosis) in pregnancy ([place]) 1979   during pregnancy. lower extremity  . Family history of colon cancer   . Family history of uterine cancer   . History of chemotherapy     cycle 5 Folfox completed 09/23/11  . History of radiation therapy 10/25/11 -12/01/11   pelvis/presacral region  . Morbid obesity ([place])   . Osteopenia   . 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 Stress incontinence, female    not current     Social History   Social History  . Marital status: Married    Spouse name: N/A  . Number of children: 1  . Years of education: N/A   Occupational History  . 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 Smoking status: Never Smoker  . Smokeless tobacco: Never Used  . Alcohol use 0.0 oz/week     Comment: occasional 3 x a week (1 drink)  . Drug use: No  . Sexual activity: Not on file   Other Topics Concern  . Not on file   Social History Narrative   Regular exercise:  No   Caffeine Use:  1 cup coffee daily   Husband passed 2/17   Daughter, son-in-law and her 2 children- they recently moved out   Completed the 12th grade, and some college.   Does not work.    Past Surgical History:  Procedure Laterality Date  . ABDOMINAL HYSTERECTOMY  3 years ago  . BREAST CYST EXCISION  1978  . CESAREAN SECTION    . COLON SURGERY     LAR, reoperation for necrotic ileostomy, transverse colostomy  . COLOSTOMY  05/27/11  . COLOSTOMY TAKEDOWN  07/11/2012   Procedure: COLOSTOMY TAKEDOWN;  Surgeon: Rolm Bookbinder, MD;  [facility];  Service: General;  Laterality: N/A;  colostomy takedown  . JOINT REPLACEMENT  2002   hip replacement  . [place]  . MASS EXCISION  2011   "mass removed from ovaries"  . PORT-A-CATH REMOVAL  07/11/2012   Procedure: REMOVAL PORT-A-CATH;  Surgeon: Rolm Bookbinder, MD;  [facility];  Service: General;  Laterality: N/A;  removal portacath  . PORTACATH PLACEMENT  07/26/11   right portacath  . TOTAL ABDOMINAL HYSTERECTOMY W/ BILATERAL SALPINGOOPHORECTOMY  11/30/09   BSO  . TOTAL HIP ARTHROPLASTY  2002   left hip    Family History  Problem Relation Age of Onset  . Uterine cancer Mother 17    deceased 71  . Diabetes Father   . Colon cancer Sister 53    currently 52  . Alcoholism Brother     No Known Allergies  Current Outpatient Prescriptions on File Prior to Visit  Medication Sig Dispense Refill  . aspirin 81 MG tablet Take 81 mg by mouth daily.      . B Complex Vitamins (B-COMPLEX/B-12 PO) Take 1 tablet by mouth daily.     . Calcium Carb-Cholecalciferol (CALCIUM-VITAMIN D3) 600-500 MG-UNIT CAPS Take 1 tablet by mouth 2 (two) times daily. 60 capsule 0  . escitalopram (LEXAPRO) 10 MG tablet 1/2 tab once daily for 1 week, then increase to a full tab once daily on week two 30 tablet 0  . hydrochlorothiazide (HYDRODIURIL) 25 MG tablet TAKE 1 TABLET(25 MG) BY MOUTH DAILY 90 tablet 0  . Omega-3 Fatty Acids (FISH OIL)  1200 MG CAPS Take 1 capsule by mouth daily.    . potassium chloride SA (K-DUR,KLOR-CON) 20 MEQ tablet TAKE 1 TABLET BY MOUTH EVERY DAY 90 tablet 1   Current Facility-Administered Medications on File Prior to Visit  Medication Dose Route Frequency Provider Last Rate Last Dose  . sodium chloride 0.9 % injection 10 mL  10 mL Intravenous PRN [name], MD   10 mL at 05/25/12 1153    BP 133/85 (BP Location: Right Arm, Patient Position: Sitting, Cuff Size: Large)   Pulse 65   Temp 97.9 F (36.6 C) (Oral)   Ht 5' 0.5" (1.537 m)   Wt 255 lb 9.6 oz (115.9 kg)   LMP 11/30/2009   SpO2 97%   BMI 49.10 kg/m       Objective:   Physical Exam  Constitutional: She is oriented to person, place, and time. She appears well-developed and well-nourished.  HENT:  Head: Normocephalic and atraumatic.  Cardiovascular: Normal rate, regular rhythm and normal heart sounds.   No murmur heard. Pulmonary/Chest: Effort normal and breath sounds normal. No respiratory distress. She has no wheezes.  Musculoskeletal: She exhibits no edema.  Neurological: She is alert and oriented to person, place, and time.  Psychiatric: Her behavior is normal. Judgment and thought content normal.  Briefly tearful but pleasant          Assessment & Plan:

## 2016-09-23 ENCOUNTER — Ambulatory Visit (INDEPENDENT_AMBULATORY_CARE_PROVIDER_SITE_OTHER): Admitting: Licensed Clinical Social Worker

## 2016-09-23 DIAGNOSIS — F331 Major depressive disorder, recurrent, moderate: Secondary | ICD-10-CM

## 2016-10-07 ENCOUNTER — Ambulatory Visit (INDEPENDENT_AMBULATORY_CARE_PROVIDER_SITE_OTHER): Admitting: Licensed Clinical Social Worker

## 2016-10-07 DIAGNOSIS — F331 Major depressive disorder, recurrent, moderate: Secondary | ICD-10-CM

## 2016-11-03 ENCOUNTER — Ambulatory Visit: Admitting: Family

## 2016-11-10 ENCOUNTER — Ambulatory Visit: Admitting: Family

## 2016-11-11 ENCOUNTER — Ambulatory Visit (INDEPENDENT_AMBULATORY_CARE_PROVIDER_SITE_OTHER): Admitting: Licensed Clinical Social Worker

## 2016-11-11 DIAGNOSIS — F331 Major depressive disorder, recurrent, moderate: Secondary | ICD-10-CM | POA: Diagnosis not present

## 2016-11-17 ENCOUNTER — Encounter: Payer: Self-pay | Admitting: Family

## 2016-11-17 ENCOUNTER — Ambulatory Visit (INDEPENDENT_AMBULATORY_CARE_PROVIDER_SITE_OTHER): Admitting: Family

## 2016-11-17 VITALS — BP 126/70 | HR 80 | Temp 98.2°F | Resp 98 | Ht 61.0 in | Wt 253.8 lb

## 2016-11-17 DIAGNOSIS — F32A Depression, unspecified: Secondary | ICD-10-CM

## 2016-11-17 DIAGNOSIS — F329 Major depressive disorder, single episode, unspecified: Secondary | ICD-10-CM

## 2016-11-17 DIAGNOSIS — R059 Cough, unspecified: Secondary | ICD-10-CM

## 2016-11-17 DIAGNOSIS — R05 Cough: Secondary | ICD-10-CM

## 2016-11-17 MED ORDER — LORATADINE 10 MG PO TABS: 10.0000 mg | ORAL_TABLET | Freq: Every day | ORAL | 11 refills | Status: DC

## 2016-11-17 NOTE — Progress Notes (Signed)
Pre visit review using our clinic review tool, if applicable. No additional management support is needed unless otherwise documented below in the visit note. 

## 2016-11-17 NOTE — Progress Notes (Signed)
Subjective:    Patient ID: Jasmine Clayton, female    DOB: 01-25-1951, 66 y.o.   MRN: [id number]  HPI  Jasmine Clayton is a 66 yr old female who presents today for follow up of her depression. Last visit she noted some improvement in her depression but still experiencing symptoms. She was scheduled to begin therapy at that time and her lexapro was increased to 20mg  once daily. She started seeing Jasmine Clayton and will be following up with Jasmine Clayton.  She reports that she is less moody, handling situations better.  Sleeps well.  She is trying to work on organizing her house but feels limited by her back and leg pain    Cough- notes recently cough. Has some "phlegm"   Review of Systems Past Medical History:  Diagnosis Date  . Adenocarcinoma of colon ([place]) 04/12/2011   "rectal-colon"  . Corneal ulcers and infections 07/04/12  . Degenerative joint disease    right knee  . DVT (deep vein thrombosis) in pregnancy ([place]) 1979   during pregnancy. lower extremity  . Family history of colon cancer   . Family history of uterine cancer   . History of chemotherapy     cycle 5 Folfox completed 09/23/11  . History of radiation therapy 10/25/11 -12/01/11   pelvis/presacral region  . Morbid obesity ([place])   . Osteopenia   . 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 Stress incontinence, female    not current     Social History   Social History  . Marital status: Married    Spouse name: N/A  . Number of children: 1  . Years of education: N/A   Occupational History  . 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 Smoking status: Never Smoker  . Smokeless tobacco: Never Used  . Alcohol use 0.0 oz/week     Comment: occasional 3 x a week (1 drink)  . Drug use: No  . Sexual activity: Not on file   Other Topics Concern  . Not on file   Social History Narrative   Regular exercise:  No   Caffeine Use:   1 cup coffee daily   Husband passed 2/17   Daughter, son-in-law and her 2 children- they recently moved out   Completed the 12th grade, and some college.   Does not work.    Past Surgical History:  Procedure Laterality Date  . ABDOMINAL HYSTERECTOMY  3 years ago  . BREAST CYST EXCISION  1978  . CESAREAN SECTION    . COLON SURGERY     LAR, reoperation for necrotic ileostomy, transverse colostomy  . COLOSTOMY  05/27/11  . COLOSTOMY TAKEDOWN  07/11/2012   Procedure: COLOSTOMY TAKEDOWN;  Surgeon: Rolm Bookbinder, MD;  Location: [facility];  Service: General;  Laterality: N/A;  colostomy takedown  . JOINT REPLACEMENT  2002   hip replacement  . [place]  . MASS EXCISION  2011   "mass removed from ovaries"  . PORT-A-CATH REMOVAL  07/11/2012   Procedure: REMOVAL PORT-A-CATH;  Surgeon: Rolm Bookbinder, MD;  Location: [facility];  Service: General;  Laterality: N/A;  removal portacath  . PORTACATH PLACEMENT  07/26/11   right portacath  . TOTAL ABDOMINAL HYSTERECTOMY W/ BILATERAL SALPINGOOPHORECTOMY  11/30/09   BSO  . TOTAL HIP ARTHROPLASTY  2002   left hip    Family History  Problem Relation Age  of Onset  . Uterine cancer Mother 46    deceased 21  . Diabetes Father   . Colon cancer Sister 4    currently 89  . Alcoholism Brother     No Known Allergies  Current Outpatient Prescriptions on File Prior to Visit  Medication Sig Dispense Refill  . aspirin 81 MG tablet Take 81 mg by mouth daily.      . B Complex Vitamins (B-COMPLEX/B-12 PO) Take 1 tablet by mouth daily.     . Calcium Carb-Cholecalciferol (CALCIUM-VITAMIN D3) 600-500 MG-UNIT CAPS Take 1 tablet by mouth 2 (two) times daily. 60 capsule 0  . escitalopram (LEXAPRO) 20 MG tablet Take 1 tablet (20 mg total) by mouth daily. 30 tablet 1  . hydrochlorothiazide (HYDRODIURIL) 25 MG tablet TAKE 1 TABLET(25 MG) BY MOUTH DAILY 90 tablet 0  . Omega-3 Fatty Acids (FISH OIL) 1200 MG CAPS Take 1 capsule by mouth daily.    .  potassium chloride SA (K-DUR,KLOR-CON) 20 MEQ tablet TAKE 1 TABLET BY MOUTH EVERY DAY 90 tablet 1  . zolpidem (AMBIEN) 5 MG tablet Take 5 mg by mouth at bedtime as needed for sleep.     Current Facility-Administered Medications on File Prior to Visit  Medication Dose Route Frequency Provider Last Rate Last Dose  . sodium chloride 0.9 % injection 10 mL  10 mL Intravenous PRN [name], MD   10 mL at 05/25/12 1153    BP 126/70 (BP Location: Right Arm) Comment (Cuff Size): thigh cuff  Pulse 80   Temp 98.2 F (36.8 C) (Oral)   Resp (!) 98 Comment: room air  Ht 5\' 1"  (1.549 m)   Wt 253 lb 12.8 oz (115.1 kg)   LMP 11/30/2009   BMI 47.96 kg/m       Objective:   Physical Exam  Constitutional: She is oriented to person, place, and time. She appears well-developed and well-nourished.  HENT:  Head: Normocephalic and atraumatic.  Cardiovascular: Normal rate, regular rhythm and normal heart sounds.   No murmur heard. Pulmonary/Chest: Effort normal and breath sounds normal. No respiratory distress. She has no wheezes.  Neurological: She is alert and oriented to person, place, and time.  Psychiatric: She has a normal mood and affect. Her behavior is normal. Judgment and thought content normal.          Assessment & Plan:  Cough- suspect secondary to allergies. Advised pt to try adding claritin and let us know if symptoms worsen or fail to improve.

## 2016-11-17 NOTE — Patient Instructions (Addendum)
Please complete lab work prior to leaving. Begin claritin once daily for allergies/cough. Call if symptoms worsen or if symptoms fail to improve.  Continue current dose of lexapro and work with Social worker.

## 2016-11-17 NOTE — Assessment & Plan Note (Signed)
Improving.  Continue current dose of lexapro and ongoing work with Social worker.

## 2016-12-06 ENCOUNTER — Ambulatory Visit (INDEPENDENT_AMBULATORY_CARE_PROVIDER_SITE_OTHER): Admitting: Psychology

## 2016-12-06 ENCOUNTER — Telehealth: Payer: Self-pay | Admitting: Family

## 2016-12-06 DIAGNOSIS — F3341 Major depressive disorder, recurrent, in partial remission: Secondary | ICD-10-CM

## 2016-12-06 NOTE — Telephone Encounter (Signed)
Pt brought DMV disability parking placard application for [employer]. Placed in dr bin.

## 2016-12-06 NOTE — Telephone Encounter (Signed)
Application for Disability Parking Placard pulled from paperwork nib and forwarded to provider/SLS 05/07

## 2016-12-10 NOTE — Telephone Encounter (Signed)
Form signed and placed at front desk for pick up. Pt notified. Copy sent for scanning.

## 2017-01-04 ENCOUNTER — Ambulatory Visit (INDEPENDENT_AMBULATORY_CARE_PROVIDER_SITE_OTHER): Admitting: Psychology

## 2017-01-04 DIAGNOSIS — F331 Major depressive disorder, recurrent, moderate: Secondary | ICD-10-CM | POA: Diagnosis not present

## 2017-02-04 ENCOUNTER — Ambulatory Visit (INDEPENDENT_AMBULATORY_CARE_PROVIDER_SITE_OTHER): Admitting: Psychology

## 2017-02-04 DIAGNOSIS — F331 Major depressive disorder, recurrent, moderate: Secondary | ICD-10-CM | POA: Diagnosis not present

## 2017-02-16 ENCOUNTER — Ambulatory Visit (INDEPENDENT_AMBULATORY_CARE_PROVIDER_SITE_OTHER): Admitting: Family

## 2017-02-16 ENCOUNTER — Encounter: Payer: Self-pay | Admitting: Family

## 2017-02-16 VITALS — BP 153/77 | HR 65 | Temp 98.1°F | Resp 16 | Ht 61.0 in | Wt 254.0 lb

## 2017-02-16 DIAGNOSIS — I10 Essential (primary) hypertension: Secondary | ICD-10-CM | POA: Diagnosis not present

## 2017-02-16 DIAGNOSIS — F329 Major depressive disorder, single episode, unspecified: Secondary | ICD-10-CM | POA: Diagnosis not present

## 2017-02-16 DIAGNOSIS — G4700 Insomnia, unspecified: Secondary | ICD-10-CM

## 2017-02-16 DIAGNOSIS — R739 Hyperglycemia, unspecified: Secondary | ICD-10-CM | POA: Diagnosis not present

## 2017-02-16 DIAGNOSIS — R400 Somnolence: Secondary | ICD-10-CM | POA: Diagnosis not present

## 2017-02-16 DIAGNOSIS — C189 Malignant neoplasm of colon, unspecified: Secondary | ICD-10-CM | POA: Diagnosis not present

## 2017-02-16 DIAGNOSIS — F32A Depression, unspecified: Secondary | ICD-10-CM

## 2017-02-16 LAB — BASIC METABOLIC PANEL
BUN: 15 mg/dL (ref 6–23)
CHLORIDE: 105 meq/L (ref 96–112)
CO2: 27 meq/L (ref 19–32)
CREATININE: 0.98 mg/dL (ref 0.40–1.20)
Calcium: 9.6 mg/dL (ref 8.4–10.5)
GFR: 73.26 mL/min (ref >60.00)
Glucose, Bld: 90 mg/dL (ref 70–99)
Potassium: 4.5 mEq/L (ref 3.5–5.1)
Sodium: 139 mEq/L (ref 135–145)

## 2017-02-16 MED ORDER — ESCITALOPRAM OXALATE 20 MG PO TABS: 20.0000 mg | ORAL_TABLET | Freq: Every day | ORAL | 5 refills | Status: DC

## 2017-02-16 MED ORDER — ZOLPIDEM TARTRATE 5 MG PO TABS: 5.0000 mg | ORAL_TABLET | Freq: Every evening | ORAL | 0 refills | Status: DC | PRN

## 2017-02-16 NOTE — Patient Instructions (Addendum)
Please complete lab work prior to leaving. You will be contacted about your referral for sleep study to check you for sleep apnea.  Dr. Pincus Badder [phone]- please call to schedule your colonoscopy.

## 2017-02-16 NOTE — Assessment & Plan Note (Signed)
Stable with ambien prn. Refill provided, will obtain routine UDS today.

## 2017-02-16 NOTE — Addendum Note (Signed)
Addended by: Peggyann Shoals on: 02/16/2017 09:14 AM   Modules accepted: Miquel Dunn

## 2017-02-16 NOTE — Assessment & Plan Note (Signed)
Obtain A1C. 

## 2017-02-16 NOTE — Assessment & Plan Note (Signed)
Doing better back on medication. Discussed importance of taking medication daily. Continue current dose of lexapro.

## 2017-02-16 NOTE — Assessment & Plan Note (Signed)
Due for follow up colonoscopy- she is given number to call and schedule.

## 2017-02-16 NOTE — Progress Notes (Signed)
Subjective:    Patient ID: Jasmine Clayton, female    DOB: Jul 15, 1951, 66 y.o.   MRN: [id number]  HPI   Jasmine Clayton is a 66 yr old female who presents today for follow up of multiple medical problems:  1) HTN- maintained on hctz 25mg .  Did not take her medication today.   BP Readings from Last 3 Encounters:  02/16/17 (!) 153/77  11/17/16 126/70  09/22/16 133/85   2) Depression- maintained on lexapro. Reports that she ran out of her lexapro and was off x 2 weeks. Mood worsened and she restarted.    3) Hyperglycemia-  Lab Results  Component Value Date   HGBA1C 5.7 04/23/2016   4) Cough-was evaluated for cough in April. Reports that cough resolved.  Now only having  intermittent cough symptoms.  5) insomnia- maintained on ambien.  She reports that she uses PRN.  She reports that she naps during the day.  Reports that she doses off unintentionally.  She reports that family tells her that she snores.  She has never had sleep study.      Review of Systems See HPI  Past Medical History:  Diagnosis Date  . Adenocarcinoma of colon ([place]) 04/12/2011   "rectal-colon"  . Corneal ulcers and infections 07/04/12  . Degenerative joint disease    right knee  . DVT (deep vein thrombosis) in pregnancy ([place]) 1979   during pregnancy. lower extremity  . Family history of colon cancer   . Family history of uterine cancer   . History of chemotherapy     cycle 5 Folfox completed 09/23/11  . History of radiation therapy 10/25/11 -12/01/11   pelvis/presacral region  . Morbid obesity ([place])   . Osteopenia   . 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 Stress incontinence, female    not current     Social History   Social History  . Marital status: Married    Spouse name: N/A  . Number of children: 1  . Years of education: N/A   Occupational History  . 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 Smoking status: Never Smoker  . Smokeless tobacco: Never Used  . Alcohol use 0.0 oz/week     Comment: occasional 3 x a week (1 drink)  . Drug use: No  . Sexual activity: Not on file   Other Topics Concern  . Not on file   Social History Narrative   Regular exercise:  No   Caffeine Use:  1 cup coffee daily   Husband passed 2/17   Daughter, son-in-law and her 2 children- they recently moved out   Completed the 12th grade, and some college.   Does not work.    Past Surgical History:  Procedure Laterality Date  . ABDOMINAL HYSTERECTOMY  3 years ago  . BREAST CYST EXCISION  1978  . CESAREAN SECTION    . COLON SURGERY     LAR, reoperation for necrotic ileostomy, transverse colostomy  . COLOSTOMY  05/27/11  . COLOSTOMY TAKEDOWN  07/11/2012   Procedure: COLOSTOMY TAKEDOWN;  Surgeon: Rolm Bookbinder, MD;  Location: [facility];  Service: General;  Laterality: N/A;  colostomy takedown  . JOINT REPLACEMENT  2002   hip replacement  . [place]  . MASS EXCISION  2011   "mass removed from ovaries"  . PORT-A-CATH REMOVAL  07/11/2012  Procedure: REMOVAL PORT-A-CATH;  Surgeon: Rolm Bookbinder, MD;  Location: [facility];  Service: General;  Laterality: N/A;  removal portacath  . PORTACATH PLACEMENT  07/26/11   right portacath  . TOTAL ABDOMINAL HYSTERECTOMY W/ BILATERAL SALPINGOOPHORECTOMY  11/30/09   BSO  . TOTAL HIP ARTHROPLASTY  2002   left hip    Family History  Problem Relation Age of Onset  . Uterine cancer Mother 47       deceased 64  . Diabetes Father   . Colon cancer Sister 9       currently 37  . Alcoholism Brother     No Known Allergies  Current Outpatient Prescriptions on File Prior to Visit  Medication Sig Dispense Refill  . aspirin 81 MG tablet Take 81 mg by mouth daily.      . B Complex Vitamins (B-COMPLEX/B-12 PO) Take 1 tablet by mouth daily.     . Calcium Carb-Cholecalciferol (CALCIUM-VITAMIN D3) 600-500 MG-UNIT CAPS Take 1 tablet by mouth 2  (two) times daily. 60 capsule 0  . hydrochlorothiazide (HYDRODIURIL) 25 MG tablet TAKE 1 TABLET(25 MG) BY MOUTH DAILY 90 tablet 0  . loratadine (CLARITIN) 10 MG tablet Take 1 tablet (10 mg total) by mouth daily. 30 tablet 11  . Omega-3 Fatty Acids (FISH OIL) 1200 MG CAPS Take 1 capsule by mouth daily.    . potassium chloride SA (K-DUR,KLOR-CON) 20 MEQ tablet TAKE 1 TABLET BY MOUTH EVERY DAY 90 tablet 1   Current Facility-Administered Medications on File Prior to Visit  Medication Dose Route Frequency Provider Last Rate Last Dose  . sodium chloride 0.9 % injection 10 mL  10 mL Intravenous PRN [name], MD   10 mL at 05/25/12 1153    BP (!) 153/77 (BP Location: Left Arm, Cuff Size: Large)   Pulse 65   Temp 98.1 F (36.7 C) (Oral)   Resp 16   Ht 5\' 1"  (1.549 m)   Wt 254 lb (115.2 kg)   LMP 11/30/2009   SpO2 100%   BMI 47.99 kg/m       Objective:   Physical Exam  Constitutional: She is oriented to person, place, and time. She appears well-developed and well-nourished.  HENT:  Head: Normocephalic and atraumatic.  Cardiovascular: Normal rate, regular rhythm and normal heart sounds.   No murmur heard. Pulmonary/Chest: Effort normal and breath sounds normal. No respiratory distress. She has no wheezes.  Musculoskeletal:  2-3+ bilateral LE edema  Neurological: She is alert and oriented to person, place, and time.  Skin: Skin is warm and dry.  Psychiatric: She has a normal mood and affect. Her behavior is normal. Judgment and thought content normal.          Assessment & Plan:  Daytime somnolence- will obtain sleep study to rule out OSA.

## 2017-02-16 NOTE — Assessment & Plan Note (Signed)
BP is elevated today. She did not take her hctz. Advised pt to take daily, repeat BP in 2 weeks with RN.

## 2017-02-17 LAB — HEMOGLOBIN A1C
Hgb A1c MFr Bld: 5.3 % (ref <5.7)
Mean Plasma Glucose: 105 mg/dL

## 2017-03-02 ENCOUNTER — Encounter

## 2017-03-07 ENCOUNTER — Ambulatory Visit (INDEPENDENT_AMBULATORY_CARE_PROVIDER_SITE_OTHER): Payer: PPO | Admitting: Psychology

## 2017-03-07 DIAGNOSIS — F331 Major depressive disorder, recurrent, moderate: Secondary | ICD-10-CM

## 2017-03-08 ENCOUNTER — Ambulatory Visit (INDEPENDENT_AMBULATORY_CARE_PROVIDER_SITE_OTHER): Payer: PPO | Admitting: Internal Medicine

## 2017-03-08 VITALS — BP 135/73 | HR 83

## 2017-03-08 DIAGNOSIS — I10 Essential (primary) hypertension: Secondary | ICD-10-CM

## 2017-03-08 NOTE — Progress Notes (Signed)
Pre visit review using our clinic tool,if applicable. No additional management support is needed unless otherwise documented below in the visit note.   Patient in for BP check per order from M.O'Sullivan dated 02/09/17.   Patient states she has taken BP medication today. States she has had back pain and SOB upon excertion and is Morbidly Obese. No SOB at this time. But states she  has discussed with M.Osullivan in the past. States she will schedule appointment with Elvera Maria to discuss further.  Last BP = 153/77 P = 65  BP today = 135/73 P=83  Per Dr. Larose Kells patient to continue medications and follow up with provider. Patient to schedule appointment upon check out.     Kathlene November, MD

## 2017-03-09 ENCOUNTER — Telehealth: Payer: Self-pay | Admitting: Family

## 2017-03-09 NOTE — Telephone Encounter (Signed)
Noted will discuss with pt on Friday.

## 2017-03-09 NOTE — Telephone Encounter (Signed)
-----   Message from Synthia Innocent sent at 03/09/2017  9:18 AM EDT ----- Regarding: FW: home sleep study Please advise ----- Message ----- From: Ilona Sorrel Sent: 03/09/2017   8:56 AM To: Synthia Innocent Subject: home sleep study                               FYI - I had this patient of Dayan Kreis's scheduled to come in today to pick up a home sleep study machine.  She called & stated she would rather have an in-lab study.  I told her she would need to let Anaalicia Reimann know so an order could be put in for her but I would cancel her from me for today.  She said she has an appt with Norvin Ohlin on Friday & she would talk to her then about it.     Judeen Hammans

## 2017-03-11 ENCOUNTER — Encounter: Payer: Self-pay | Admitting: Family

## 2017-03-11 ENCOUNTER — Ambulatory Visit (HOSPITAL_BASED_OUTPATIENT_CLINIC_OR_DEPARTMENT_OTHER)
Admission: RE | Admit: 2017-03-11 | Discharge: 2017-03-11 | Disposition: A | Discharge status: Home / Self Care | Payer: PPO | Source: Ambulatory Visit | Attending: Family | Admitting: Family

## 2017-03-11 ENCOUNTER — Ambulatory Visit (INDEPENDENT_AMBULATORY_CARE_PROVIDER_SITE_OTHER): Payer: PPO | Admitting: Family

## 2017-03-11 VITALS — BP 127/62 | HR 78 | Temp 98.1°F | Resp 18 | Ht 61.0 in | Wt 256.0 lb

## 2017-03-11 DIAGNOSIS — M545 Low back pain, unspecified: Secondary | ICD-10-CM

## 2017-03-11 DIAGNOSIS — R059 Cough, unspecified: Secondary | ICD-10-CM

## 2017-03-11 DIAGNOSIS — R05 Cough: Secondary | ICD-10-CM

## 2017-03-11 DIAGNOSIS — M25561 Pain in right knee: Secondary | ICD-10-CM

## 2017-03-11 MED ORDER — MELOXICAM 7.5 MG PO TABS: 7.5000 mg | ORAL_TABLET | Freq: Every day | ORAL | 0 refills | Status: DC

## 2017-03-11 MED ORDER — BENZONATATE 100 MG PO CAPS: 100.0000 mg | ORAL_CAPSULE | Freq: Three times a day (TID) | ORAL | 0 refills | Status: DC | PRN

## 2017-03-11 NOTE — Progress Notes (Signed)
Subjective:    Patient ID: Jasmine Clayton, female    DOB: 1950-08-25, 66 y.o.   MRN: [id number]  HPI  Ms. Borenstein is a 66 year old female who presents today with chief complaint of low back pain. She reports of low back pain is present for approximately one year. Reports pain is worsening. Frequency is increasing. She reports that pain is worst with walking/standing .  Denies pain with sitting.  Pain is non-radiating. She reports ongoing right knee pain.  She also reports + cough.  Reports that she use Claritin which seemed to help for some time. Then stopped the Claritin. Cough is back.  Review of Systems    see HPI  Past Medical History:  Diagnosis Date  . Adenocarcinoma of colon ([place]) 04/12/2011   "rectal-colon"  . Corneal ulcers and infections 07/04/12  . Degenerative joint disease    right knee  . DVT (deep vein thrombosis) in pregnancy ([place]) 1979   during pregnancy. lower extremity  . Family history of colon cancer   . Family history of uterine cancer   . History of chemotherapy     cycle 5 Folfox completed 09/23/11  . History of radiation therapy 10/25/11 -12/01/11   pelvis/presacral region  . Morbid obesity ([place])   . Osteopenia   . 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 Stress incontinence, female    not current     Social History   Social History  . Marital status: Married    Spouse name: N/A  . Number of children: 1  . Years of education: N/A   Occupational History  . 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 Smoking status: Never Smoker  . Smokeless tobacco: Never Used  . Alcohol use 0.0 oz/week     Comment: occasional 3 x a week (1 drink)  . Drug use: No  . Sexual activity: Not on file   Other Topics Concern  . Not on file   Social History Narrative   Regular exercise:  No   Caffeine Use:  1 cup coffee daily   Husband passed 2/17   Daughter,  son-in-law and her 2 children- they recently moved out   Completed the 12th grade, and some college.   Does not work.    Past Surgical History:  Procedure Laterality Date  . ABDOMINAL HYSTERECTOMY  3 years ago  . BREAST CYST EXCISION  1978  . CESAREAN SECTION    . COLON SURGERY     LAR, reoperation for necrotic ileostomy, transverse colostomy  . COLOSTOMY  05/27/11  . COLOSTOMY TAKEDOWN  07/11/2012   Procedure: COLOSTOMY TAKEDOWN;  Surgeon: Rolm Bookbinder, MD;  Location: [facility];  Service: General;  Laterality: N/A;  colostomy takedown  . JOINT REPLACEMENT  2002   hip replacement  . [place]  . MASS EXCISION  2011   "mass removed from ovaries"  . PORT-A-CATH REMOVAL  07/11/2012   Procedure: REMOVAL PORT-A-CATH;  Surgeon: Rolm Bookbinder, MD;  Location: [facility];  Service: General;  Laterality: N/A;  removal portacath  . PORTACATH PLACEMENT  07/26/11   right portacath  . TOTAL ABDOMINAL HYSTERECTOMY W/ BILATERAL SALPINGOOPHORECTOMY  11/30/09   BSO  . TOTAL HIP ARTHROPLASTY  2002   left hip    Family History  Problem Relation Age of Onset  . Uterine cancer Mother 35  deceased 28  . Diabetes Father   . Colon cancer Sister 43       currently 31  . Alcoholism Brother     No Known Allergies  Current Outpatient Prescriptions on File Prior to Visit  Medication Sig Dispense Refill  . aspirin 81 MG tablet Take 81 mg by mouth daily.      . B Complex Vitamins (B-COMPLEX/B-12 PO) Take 1 tablet by mouth daily.     . Calcium Carb-Cholecalciferol (CALCIUM-VITAMIN D3) 600-500 MG-UNIT CAPS Take 1 tablet by mouth 2 (two) times daily. 60 capsule 0  . escitalopram (LEXAPRO) 20 MG tablet Take 1 tablet (20 mg total) by mouth daily. 30 tablet 5  . hydrochlorothiazide (HYDRODIURIL) 25 MG tablet TAKE 1 TABLET(25 MG) BY MOUTH DAILY 90 tablet 0  . loratadine (CLARITIN) 10 MG tablet Take 1 tablet (10 mg total) by mouth daily. 30 tablet 11  . Omega-3 Fatty Acids (FISH OIL) 1200  MG CAPS Take 1 capsule by mouth daily.    . potassium chloride SA (K-DUR,KLOR-CON) 20 MEQ tablet TAKE 1 TABLET BY MOUTH EVERY DAY 90 tablet 1  . zolpidem (AMBIEN) 5 MG tablet Take 1 tablet (5 mg total) by mouth at bedtime as needed for sleep. 30 tablet 0   Current Facility-Administered Medications on File Prior to Visit  Medication Dose Route Frequency Provider Last Rate Last Dose  . sodium chloride 0.9 % injection 10 mL  10 mL Intravenous PRN [name], MD   10 mL at 05/25/12 1153    BP 127/62 (BP Location: Left Arm, Cuff Size: Large)   Pulse 78   Temp 98.1 F (36.7 C) (Oral)   Resp 18   Ht 5\' 1"  (1.549 m)   Wt 256 lb (116.1 kg)   LMP 11/30/2009   SpO2 98%   BMI 48.37 kg/m    Objective:   Physical Exam  Constitutional: She is oriented to person, place, and time. She appears well-developed and well-nourished.  HENT:  Head: Normocephalic and atraumatic.  Cardiovascular: Normal rate, regular rhythm and normal heart sounds.   No murmur heard. Pulmonary/Chest: Effort normal and breath sounds normal. No respiratory distress. She has no wheezes.  Musculoskeletal: She exhibits no edema.       Cervical back: She exhibits no tenderness.       Thoracic back: She exhibits no tenderness.       Lumbar back: She exhibits no tenderness.  Mild right knee swelling is noted. Full range of motion.  Neurological: She is alert and oriented to person, place, and time.  bilateral lower extremity strength is 5 out of 5  Psychiatric: She has a normal mood and affect. Her behavior is normal. Judgment and thought content normal.          Assessment & Plan:  cough-advised patient to restart Claritin. I will check a chest x-ray to further evaluate. We'll also ask patient to use Tessalon as needed.  Low back pain- offered referral to physical therapy she declines at this time. We'll give her a trial of meloxicam. I also gave her printout on some exercises she can do for low back pain. Advised  pt to let me if symptoms worsen or are not improved in 1 month.  Right knee pain- refer to ortho, trial of meloxicam.

## 2017-03-11 NOTE — Patient Instructions (Addendum)
Please restart claririn Complete chest x ray on the first floor. For cough you may use tessalon as needed. Let me know if cough worsens or if it does not improve. For knee pain- you will be contacted about your referral to orthopedics. Star meloxicam once daily for knee pain and back pain.     Back Exercises The following exercises strengthen the muscles that help to support the back. They also help to keep the lower back flexible. Doing these exercises can help to prevent back pain or lessen existing pain. If you have back pain or discomfort, try doing these exercises 2-3 times each day or as told by your health care provider. When the pain goes away, do them once each day, but increase the number of times that you repeat the steps for each exercise (do more repetitions). If you do not have back pain or discomfort, do these exercises once each day or as told by your health care provider. Exercises Single Knee to Chest  Repeat these steps 3-5 times for each leg: 1. Lie on your back on a firm bed or the floor with your legs extended. 2. Bring one knee to your chest. Your other leg should stay extended and in contact with the floor. 3. Hold your knee in place by grabbing your knee or thigh. 4. Pull on your knee until you feel a gentle stretch in your lower back. 5. Hold the stretch for 10-30 seconds. 6. Slowly release and straighten your leg.  Pelvic Tilt  Repeat these steps 5-10 times: 1. Lie on your back on a firm bed or the floor with your legs extended. 2. Bend your knees so they are pointing toward the ceiling and your feet are flat on the floor. 3. Tighten your lower abdominal muscles to press your lower back against the floor. This motion will tilt your pelvis so your tailbone points up toward the ceiling instead of pointing to your feet or the floor. 4. With gentle tension and even breathing, hold this position for 5-10 seconds.  Cat-Cow  Repeat these steps until your lower  back becomes more flexible: 1. Get into a hands-and-knees position on a firm surface. Keep your hands under your shoulders, and keep your knees under your hips. You may place padding under your knees for comfort. 2. Let your head hang down, and point your tailbone toward the floor so your lower back becomes rounded like the back of a cat. 3. Hold this position for 5 seconds. 4. Slowly lift your head and point your tailbone up toward the ceiling so your back forms a sagging arch like the back of a cow. 5. Hold this position for 5 seconds.  Press-Ups  Repeat these steps 5-10 times: 1. Lie on your abdomen (face-down) on the floor. 2. Place your palms near your head, about shoulder-width apart. 3. While you keep your back as relaxed as possible and keep your hips on the floor, slowly straighten your arms to raise the top half of your body and lift your shoulders. Do not use your back muscles to raise your upper torso. You may adjust the placement of your hands to make yourself more comfortable. 4. Hold this position for 5 seconds while you keep your back relaxed. 5. Slowly return to lying flat on the floor.  Bridges  Repeat these steps 10 times: 1. Lie on your back on a firm surface. 2. Bend your knees so they are pointing toward the ceiling and your feet are flat  on the floor. 3. Tighten your buttocks muscles and lift your buttocks off of the floor until your waist is at almost the same height as your knees. You should feel the muscles working in your buttocks and the back of your thighs. If you do not feel these muscles, slide your feet 1-2 inches farther away from your buttocks. 4. Hold this position for 3-5 seconds. 5. Slowly lower your hips to the starting position, and allow your buttocks muscles to relax completely.  If this exercise is too easy, try doing it with your arms crossed over your chest. Abdominal Crunches  Repeat these steps 5-10 times: 1. Lie on your back on a firm bed or  the floor with your legs extended. 2. Bend your knees so they are pointing toward the ceiling and your feet are flat on the floor. 3. Cross your arms over your chest. 4. Tip your chin slightly toward your chest without bending your neck. 5. Tighten your abdominal muscles and slowly raise your trunk (torso) high enough to lift your shoulder blades a tiny bit off of the floor. Avoid raising your torso higher than that, because it can put too much stress on your low back and it does not help to strengthen your abdominal muscles. 6. Slowly return to your starting position.  Back Lifts Repeat these steps 5-10 times: 1. Lie on your abdomen (face-down) with your arms at your sides, and rest your forehead on the floor. 2. Tighten the muscles in your legs and your buttocks. 3. Slowly lift your chest off of the floor while you keep your hips pressed to the floor. Keep the back of your head in line with the curve in your back. Your eyes should be looking at the floor. 4. Hold this position for 3-5 seconds. 5. Slowly return to your starting position.  Contact a health care provider if:  Your back pain or discomfort gets much worse when you do an exercise.  Your back pain or discomfort does not lessen within 2 hours after you exercise. If you have any of these problems, stop doing these exercises right away. Do not do them again unless your health care provider says that you can. Get help right away if:  You develop sudden, severe back pain. If this happens, stop doing the exercises right away. Do not do them again unless your health care provider says that you can. This information is not intended to replace advice given to you by your health care provider. Make sure you discuss any questions you have with your health care provider. Document Released: 08/26/2004 Document Revised: 11/26/2015 Document Reviewed: 09/12/2014 Elsevier Interactive Patient Education  2017 [employer].

## 2017-03-11 NOTE — Progress Notes (Signed)
give 

## 2017-04-05 ENCOUNTER — Ambulatory Visit (INDEPENDENT_AMBULATORY_CARE_PROVIDER_SITE_OTHER): Payer: PPO

## 2017-04-05 ENCOUNTER — Ambulatory Visit (INDEPENDENT_AMBULATORY_CARE_PROVIDER_SITE_OTHER): Payer: PPO | Admitting: Physician Assistant

## 2017-04-05 DIAGNOSIS — M1711 Unilateral primary osteoarthritis, right knee: Secondary | ICD-10-CM

## 2017-04-05 DIAGNOSIS — M5441 Lumbago with sciatica, right side: Secondary | ICD-10-CM

## 2017-04-05 DIAGNOSIS — M5136 Other intervertebral disc degeneration, lumbar region: Secondary | ICD-10-CM

## 2017-04-05 DIAGNOSIS — G8929 Other chronic pain: Secondary | ICD-10-CM | POA: Insufficient documentation

## 2017-04-05 DIAGNOSIS — M51369 Other intervertebral disc degeneration, lumbar region without mention of lumbar back pain or lower extremity pain: Secondary | ICD-10-CM

## 2017-04-05 MED ORDER — METHYLPREDNISOLONE 4 MG PO TABS: ORAL_TABLET | ORAL | 0 refills | Status: DC

## 2017-04-05 MED ORDER — MELOXICAM 7.5 MG PO TABS: 7.5000 mg | ORAL_TABLET | Freq: Every day | ORAL | 2 refills | Status: DC

## 2017-04-05 MED ORDER — METHOCARBAMOL 500 MG PO TABS: 500.0000 mg | ORAL_TABLET | Freq: Three times a day (TID) | ORAL | 1 refills | Status: DC

## 2017-04-05 NOTE — Progress Notes (Signed)
Office Visit Note   Patient: Jasmine Clayton           Date of Birth: 1951/04/21           MRN: [id number] Visit Date: 04/05/2017              Requested by: Debbrah Alar, NP [address] [place], [place] [address] PCP: Debbrah Alar, NP   Assessment & Plan: Visit Diagnoses:  1. Primary osteoarthritis of right knee   2. Degenerative disc disease, lumbar     Plan: Handout on back exercises given. We'll also send her to outpatient physical therapy for core strengthening, hamstring stretching and quad strengthening and home exercise program. Place her on a Medrol Dosepak and Robaxin. She states no other NSAIDs while on the Medrol Dosepak or her Mobic . Her [place] prescription was refilled today. We'll see her back in 1 month.  Follow-Up Instructions: Return in about 4 weeks (around 05/03/2017).   Orders:  Orders Placed This Encounter  Procedures  . XR Knee 1-2 Views Right  . XR Lumbar Spine 2-3 Views   Meds ordered this encounter  Medications  . meloxicam (MOBIC) 7.5 MG tablet    Sig: Take 1 tablet (7.5 mg total) by mouth daily.    Dispense:  30 tablet    Refill:  2  . methocarbamol (ROBAXIN) 500 MG tablet    Sig: Take 1 tablet (500 mg total) by mouth 3 (three) times daily.    Dispense:  40 tablet    Refill:  1  . methylPREDNISolone (MEDROL) 4 MG tablet    Sig: Take as directed    Dispense:  21 tablet    Refill:  0      Procedures: No procedures performed   Clinical Data: No additional findings.   Subjective: No chief complaint on file.   HPI Jasmine Clayton is a 66 year old female comes in today for right knee pain and low back pain. She states her right knee pain is been ongoing for the past 5-6 years. She fell off of work some of the sutures givens had pain in the knee since then. She's tried cortisone injections in the knee and also hyaluronic acid injections in the knee. She feels that these give her minimal relief. She's also  tried a knee brace without any relief. Oblique does help some. She states the knee feels weak. She is unsure if her back or her knee gives out. Otherwise no mechanical symptoms of the knee. Low back pain is been ongoing for 3 years. She's had no treatment for this outside of heat Biofreeze Tylenol. States her pain is 9 out of 10 at worst. Pain does awaken her. She has urinary incontinence at baseline. She has no radicular pain down either leg. Review of Systems Please see history of present illness otherwise negative  Objective: Vital Signs: LMP 11/30/2009   Physical Exam  Constitutional: She is oriented to person, place, and time. She appears well-developed and well-nourished. No distress.  Cardiovascular: Intact distal pulses.   Pulmonary/Chest: Effort normal.  Neurological: She is alert and oriented to person, place, and time.  Skin: She is not diaphoretic.  Psychiatric: She has a normal mood and affect.    Ortho Exam Negative straight leg raise bilaterally. Tight hamstrings bilaterally. Deep tendon reflexes are 2+ at the knees and 1+ at the ankles and equal and symmetric. 5 out of 5 strength throughout lower extremities against resistance. Good range of motion bilateral hips  without pain. Nontender over the trochanteric region of both hips. Bilateral knee she has overall good range of motion. Tenderness lateral joint line of the right knee. Passive range of motion of both knees reveals right knee with crepitus. Anterior drawer is negative bilaterally. Valgus varus stressing both knees negative. Slight effusion right knee no effusion left knee. No abnormal warmth erythema of either knee. Calf supple nontender bilaterally Specialty Comments:  No specialty comments available.  Imaging: Xr Knee 1-2 Views Right  Result Date: 04/05/2017 AP lateral views right knee shows lateral joint line narrowing moderate/severe. Severe patellofemoral changes. No acute fractures. Knee is well located.  Xr  Lumbar Spine 2-3 Views  Result Date: 04/05/2017 Lumbar spine 2 views: No acute fractures. No spondylolisthesis. Loss of normal lordotic curvature. Endplate spurring throughout. Generative disc disease L1-L2 3 L3-4.    PMFS History: Patient Active Problem List   Diagnosis Date Noted  . 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 Osteopenia   . Depression 08/25/2016  . Insomnia 11/28/2015  . Family history of uterine cancer   . Vaginal dryness, menopausal 12/12/2014  . Hyperglycemia 01/07/2014  . Osteoarthritis, knee 02/16/2013  . Routine general medical examination at a health care facility 03/27/2012  . Adenocarcinoma of colon ([place]) 04/12/2011  . Morbid obesity ([place]) 02/10/2011  . HTN (hypertension) 02/10/2011   Past Medical History:  Diagnosis Date  . Adenocarcinoma of colon ([place]) 04/12/2011   "rectal-colon"  . Corneal ulcers and infections 07/04/12  . Degenerative joint disease    right knee  . DVT (deep vein thrombosis) in pregnancy ([place]) 1979   during pregnancy. lower extremity  . Family history of colon cancer   . Family history of uterine cancer   . History of chemotherapy     cycle 5 Folfox completed 09/23/11  . History of radiation therapy 10/25/11 -12/01/11   pelvis/presacral region  . Morbid obesity ([place])   . Osteopenia   . 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 Stress incontinence, female    not current    Family History  Problem Relation Age of Onset  . Uterine cancer Mother 27       deceased 101  . Diabetes Father   . Colon cancer Sister 51       currently 61  . Alcoholism Brother     Past Surgical History:  Procedure Laterality Date  . ABDOMINAL HYSTERECTOMY  3 years ago  . BREAST CYST EXCISION  1978  . CESAREAN SECTION    . COLON SURGERY     LAR, reoperation for necrotic ileostomy, transverse colostomy  . COLOSTOMY  05/27/11  . COLOSTOMY TAKEDOWN  07/11/2012    Procedure: COLOSTOMY TAKEDOWN;  Surgeon: Rolm Bookbinder, MD;  Location: [facility];  Service: General;  Laterality: N/A;  colostomy takedown  . JOINT REPLACEMENT  2002   hip replacement  . [place]  . MASS EXCISION  2011   "mass removed from ovaries"  . PORT-A-CATH REMOVAL  07/11/2012   Procedure: REMOVAL PORT-A-CATH;  Surgeon: Rolm Bookbinder, MD;  [facility]: [facility];  Service: General;  Laterality: N/A;  removal portacath  . PORTACATH PLACEMENT  07/26/11   right portacath  . TOTAL ABDOMINAL HYSTERECTOMY W/ BILATERAL SALPINGOOPHORECTOMY  11/30/09   BSO  . TOTAL HIP ARTHROPLASTY  2002   left hip   Social History   Occupational History  . 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 Smoking status: Never Smoker  . Smokeless tobacco: Never Used  . Alcohol use 0.0 oz/week     Comment: occasional 3 x a week (1 drink)  . Drug use: No  . Sexual activity: Not on file

## 2017-04-12 ENCOUNTER — Other Ambulatory Visit: Payer: Self-pay | Admitting: Family

## 2017-04-12 ENCOUNTER — Telehealth: Payer: Self-pay | Admitting: Family

## 2017-04-12 DIAGNOSIS — Z85038 Personal history of other malignant neoplasm of large intestine: Secondary | ICD-10-CM

## 2017-04-12 DIAGNOSIS — Z1231 Encounter for screening mammogram for malignant neoplasm of breast: Secondary | ICD-10-CM

## 2017-04-12 NOTE — Telephone Encounter (Signed)
Spoke with pt. She states she is due for follow up colonoscopy and denies symptoms at this time. Would like to go back to Dr Pincus Badder at [facility] as that is who performed her last procedure. Referral pended.

## 2017-04-12 NOTE — Telephone Encounter (Signed)
Pt says that she would like to have orders placed for a colonoscopy with Dr. Pincus Badder at [facility]

## 2017-05-04 ENCOUNTER — Other Ambulatory Visit (INDEPENDENT_AMBULATORY_CARE_PROVIDER_SITE_OTHER): Payer: Self-pay

## 2017-05-04 ENCOUNTER — Ambulatory Visit (INDEPENDENT_AMBULATORY_CARE_PROVIDER_SITE_OTHER): Payer: PPO | Admitting: Physician Assistant

## 2017-05-04 DIAGNOSIS — G8929 Other chronic pain: Secondary | ICD-10-CM

## 2017-05-04 DIAGNOSIS — C189 Malignant neoplasm of colon, unspecified: Secondary | ICD-10-CM | POA: Diagnosis not present

## 2017-05-04 DIAGNOSIS — Z85038 Personal history of other malignant neoplasm of large intestine: Secondary | ICD-10-CM | POA: Diagnosis not present

## 2017-05-04 DIAGNOSIS — M1711 Unilateral primary osteoarthritis, right knee: Secondary | ICD-10-CM

## 2017-05-04 DIAGNOSIS — M25561 Pain in right knee: Principal | ICD-10-CM

## 2017-05-04 DIAGNOSIS — Z9049 Acquired absence of other specified parts of digestive tract: Secondary | ICD-10-CM | POA: Diagnosis not present

## 2017-05-04 DIAGNOSIS — Z08 Encounter for follow-up examination after completed treatment for malignant neoplasm: Secondary | ICD-10-CM | POA: Diagnosis not present

## 2017-05-04 DIAGNOSIS — Z23 Encounter for immunization: Secondary | ICD-10-CM | POA: Diagnosis not present

## 2017-05-04 NOTE — Progress Notes (Signed)
Office Visit Note   Patient: Jasmine Clayton           Date of Birth: 1950-11-20           MRN: [id number] Visit Date: 05/04/2017              Requested by: Debbrah Alar, NP [address] [place], [place] [address] PCP: Debbrah Alar, NP   Assessment & Plan: Visit Diagnoses:  1. Primary osteoarthritis of right knee     Plan: We will have her contact physical therapy due to the fact that she is has not heard from therapy. Physical therapy work on range of motion strengthening right knee, home exercise program, low back exercises stretching exercises and include modalities. She'll try natural anti-inflammatories glucosamine or tumor. Continue her mobility. Pain persist or becomes worse especially with her knee she'll contact our office she's definitely thinking about possible right total knee arthroplasty as all other forms of conservative measures have failed  Follow-Up Instructions: Return if symptoms worsen or fail to improve.   Orders:  No orders of the defined types were placed in this encounter.  No orders of the defined types were placed in this encounter.     Procedures: No procedures performed   Clinical Data: No additional findings.   Subjective: Right knee pain  HPI Mrs. Jasmine Clayton returns today follow-up of her right knee and low back pain. She states her left knee pain is improved. She's not having radicular symptoms down either leg. Main complaint is right knee pain. States right knee pain came back as soon as she finished with the Dosepak. She's been taking Ferol Luz is really having no relief with this. She did not get to physical therapy and she states she has not contacted by therapy in the computer though it but states that 3 messages were left for her. Review of Systems No radicular symptoms down either leg.  Objective: Vital Signs: LMP 11/30/2009   Physical Exam  Constitutional: She is oriented to person, place, and time.  She appears well-developed and well-nourished. No distress.  Neurological: She is alert and oriented to person, place, and time.  Skin: She is not diaphoretic.  Psychiatric: She has a normal mood and affect. Her behavior is normal.    Ortho Exam Right  knee no instability abnormal warmth. Significant crepitus with passive range of motion. Overall good range of motion with full extension and flexion beyond 90. Specialty Comments:  No specialty comments available.  Imaging: No results found.   PMFS History: Patient Active Problem List   Diagnosis Date Noted  . 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 Osteopenia   . Depression 08/25/2016  . Insomnia 11/28/2015  . Family history of uterine cancer   . Vaginal dryness, menopausal 12/12/2014  . Hyperglycemia 01/07/2014  . Osteoarthritis, knee 02/16/2013  . Routine general medical examination at a health care facility 03/27/2012  . Adenocarcinoma of colon ([place]) 04/12/2011  . Morbid obesity ([place]) 02/10/2011  . HTN (hypertension) 02/10/2011   Past Medical History:  Diagnosis Date  . Adenocarcinoma of colon ([place]) 04/12/2011   "rectal-colon"  . Corneal ulcers and infections 07/04/12  . Degenerative joint disease    right knee  . DVT (deep vein thrombosis) in pregnancy ([place]) 1979   during pregnancy. lower extremity  . Family history of colon cancer   . Family history of uterine cancer   . History of chemotherapy     cycle 5  Folfox completed 09/23/11  . History of radiation therapy 10/25/11 -12/01/11   pelvis/presacral region  . Morbid obesity ([place])   . Osteopenia   . 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 Stress incontinence, female    not current    Family History  Problem Relation Age of Onset  . Uterine cancer Mother 58       deceased 17  . Diabetes Father   . Colon cancer Sister 23       currently 29  . Alcoholism Brother       Past Surgical History:  Procedure Laterality Date  . ABDOMINAL HYSTERECTOMY  3 years ago  . BREAST CYST EXCISION  1978  . CESAREAN SECTION    . COLON SURGERY     LAR, reoperation for necrotic ileostomy, transverse colostomy  . COLOSTOMY  05/27/11  . COLOSTOMY TAKEDOWN  07/11/2012   Procedure: COLOSTOMY TAKEDOWN;  Surgeon: Rolm Bookbinder, MD;  [facility];  Service: General;  Laterality: N/A;  colostomy takedown  . JOINT REPLACEMENT  2002   hip replacement  . [place]  . MASS EXCISION  2011   "mass removed from ovaries"  . PORT-A-CATH REMOVAL  07/11/2012   Procedure: REMOVAL PORT-A-CATH;  Surgeon: Rolm Bookbinder, MD;  [facility];  Service: General;  Laterality: N/A;  removal portacath  . PORTACATH PLACEMENT  07/26/11   right portacath  . TOTAL ABDOMINAL HYSTERECTOMY W/ BILATERAL SALPINGOOPHORECTOMY  11/30/09   BSO  . TOTAL HIP ARTHROPLASTY  2002   left hip   Social History   Occupational History  . 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 Smoking status: Never Smoker  . Smokeless tobacco: Never Used  . Alcohol use 0.0 oz/week     Comment: occasional 3 x a week (1 drink)  . Drug use: No  . Sexual activity: Not on file

## 2017-05-09 ENCOUNTER — Ambulatory Visit (INDEPENDENT_AMBULATORY_CARE_PROVIDER_SITE_OTHER): Payer: PPO | Admitting: Psychology

## 2017-05-09 DIAGNOSIS — F331 Major depressive disorder, recurrent, moderate: Secondary | ICD-10-CM | POA: Diagnosis not present

## 2017-05-10 ENCOUNTER — Ambulatory Visit: Payer: PPO | Attending: Physician Assistant | Admitting: Physical Therapy

## 2017-05-10 DIAGNOSIS — M25561 Pain in right knee: Secondary | ICD-10-CM | POA: Diagnosis not present

## 2017-05-10 DIAGNOSIS — G8929 Other chronic pain: Secondary | ICD-10-CM | POA: Diagnosis not present

## 2017-05-10 DIAGNOSIS — R29898 Other symptoms and signs involving the musculoskeletal system: Secondary | ICD-10-CM | POA: Diagnosis not present

## 2017-05-10 DIAGNOSIS — R262 Difficulty in walking, not elsewhere classified: Secondary | ICD-10-CM | POA: Diagnosis not present

## 2017-05-10 NOTE — Therapy (Signed)
[facility] [address]  [place], [place], [address] Phone: [phone]   Fax:  [phone]  Physical Therapy Evaluation  Patient Details  Name: Jasmine Clayton MRN: [id number] Date of Birth: 1950-08-07 Referring Provider: Erskine Emery, PA-C  Encounter Date: 05/10/2017      PT End of Session - 05/10/17 1040    Visit Number 1   Number of Visits 12   Date for PT Re-Evaluation 06/21/17   Authorization Type [employer]   PT Start Time 1009   PT Stop Time 1049   PT Time Calculation (min) 40 min   Activity Tolerance Patient tolerated treatment well   Behavior During Therapy [facility] for tasks assessed/performed      Past Medical History:  Diagnosis Date  . Adenocarcinoma of colon ([place]) 04/12/2011   "rectal-colon"  . Corneal ulcers and infections 07/04/12  . Degenerative joint disease    right knee  . DVT (deep vein thrombosis) in pregnancy ([place]) 1979   during pregnancy. lower extremity  . Family history of colon cancer   . Family history of uterine cancer   . History of chemotherapy     cycle 5 Folfox completed 09/23/11  . History of radiation therapy 10/25/11 -12/01/11   pelvis/presacral region  . Morbid obesity ([place])   . Osteopenia   . 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 Stress incontinence, female    not current    Past Surgical History:  Procedure Laterality Date  . ABDOMINAL HYSTERECTOMY  3 years ago  . BREAST CYST EXCISION  1978  . CESAREAN SECTION    . COLON SURGERY     LAR, reoperation for necrotic ileostomy, transverse colostomy  . COLOSTOMY  05/27/11  . COLOSTOMY TAKEDOWN  07/11/2012   Procedure: COLOSTOMY TAKEDOWN;  Surgeon: Rolm Bookbinder, MD;  [facility];  Service: General;  Laterality: N/A;  colostomy takedown  . JOINT REPLACEMENT  2002   hip replacement  . [place]  . MASS EXCISION  2011   "mass  removed from ovaries"  . PORT-A-CATH REMOVAL  07/11/2012   Procedure: REMOVAL PORT-A-CATH;  Surgeon: Rolm Bookbinder, MD;  [facility];  Service: General;  Laterality: N/A;  removal portacath  . PORTACATH PLACEMENT  07/26/11   right portacath  . TOTAL ABDOMINAL HYSTERECTOMY W/ BILATERAL SALPINGOOPHORECTOMY  11/30/09   BSO  . TOTAL HIP ARTHROPLASTY  2002   left hip    There were no vitals filed for this visit.       Subjective Assessment - 05/10/17 1011    Subjective Patient reporting R knee pain as well as lower back. Pain started approx 7 years ago. Reports a history of "deck caving in". Feels like she had to hold onto the cart in grocery store to help with knee and back pain, otherwise can only walk short distances. Has had injections in the past. Has knee brace, but chooses not to wear it. Trying PT, otherwise is considering a TKA.   Pertinent History colon cancer, HTN, L THA   Limitations Standing;Walking   Diagnostic tests Xray: arthritis per patient   Patient Stated Goals improve pain, walk without pain   Currently in Pain? Yes   Pain Score 5    Pain Location Knee   Pain Orientation Right   Pain Descriptors / Indicators Aching;Sore   Pain Type Chronic pain   Pain Onset More than a  month ago   Pain Frequency Intermittent   Aggravating Factors  walking, standing   Pain Relieving Factors meloxicam            [facility] PT Assessment - 05/10/17 1017      Assessment   Medical Diagnosis Chronic R knee pain   Referring Provider Erskine Emery, PA-C   Onset Date/Surgical Date --  ~7 years ago   Next MD Visit prn   Prior Therapy no     Precautions   Precautions None     Restrictions   Weight Bearing Restrictions No     Balance Screen   Has the patient fallen in the past 6 months No   Has the patient had a decrease in activity level because of a fear of falling?  No   Is the patient reluctant to leave their home because of a fear of falling?  No     Home  Ec[place] residence     Prior Function   Level of Independence Independent   Vocation Retired   Leisure grandchildren     Cognition   Overall Cognitive Status Within Functional Limits for tasks assessed     Observation/Other Assessments   Focus on Therapeutic Outcomes (FOTO)  Knee: 41 (59% limited, predicted 43% limited)     Sensation   Light Touch Appears Intact     Coordination   Gross Motor Movements are Fluid and Coordinated Yes     Posture/Postural Control   Posture/Postural Control Postural limitations   Postural Limitations Rounded Shoulders;Forward head     ROM / Strength   AROM / PROM / Strength AROM;Strength     AROM   AROM Assessment Site Knee   Right/Left Knee Right   Right Knee Extension 1   Right Knee Flexion 111     Strength   Overall Strength Comments R LE grossly 4-/5     Flexibility   Soft Tissue Assessment /Muscle Length yes   Hamstrings R mild tightness     Palpation   Patella mobility reduced mobility with superior/inferior glides            Objective measurements completed on examination: See above findings.          [place] Adult PT Treatment/Exercise - 05/10/17 1017      Exercises   Exercises Knee/Hip     Knee/Hip Exercises: Stretches   Passive Hamstring Stretch Right;3 reps;30 seconds     Knee/Hip Exercises: Seated   Long Arc Quad Right;10 reps   Long Arc Quad Limitations with ball squeeze     Knee/Hip Exercises: Supine   Short Arc [employer] Right;10 reps   Straight Leg Raises Right;10 reps                PT Education - 05/10/17 1039    Education provided Yes   Education Details exam findings, POC, HEP   Person(s) Educated Patient   Methods Explanation;Demonstration;Handout   Comprehension Verbalized understanding;Returned demonstration          PT Short Term Goals - 05/10/17 1100      PT SHORT TERM GOAL #1   Title patient to be independent with initial HEP   Status  New   [place] Date 05/31/17     PT SHORT TERM GOAL #2   Title patient to demonstrate R knee AROM 0-115   Status New   Target Date 05/31/17           PT Long Term  Goals - 05/10/17 1101      PT LONG TERM GOAL #1   Title patient to be independent with advanced HEP   Status New   Target Date 06/21/17     PT LONG TERM GOAL #2   Title Patient to improve R LE strength to >/= 4+/5   Status New   Target Date 06/21/17     PT LONG TERM GOAL #3   Title Patient to report ability to ambulate for >/= 30 min without pain limiting   Status New   Target Date 06/21/17     PT LONG TERM GOAL #4   Title patient to report initiation and maintenance of walking program for exercise on most days of the week   Status New   Target Date 06/21/17                Plan - 05/10/17 1040    Clinical Impression Statement Patient is a 66 y/o female presenting to [facility] today regarding R knee pain that has been persistent for some time. Patient today with slightly limited AROM and strength as compared to L LE with pain limiting. Patient also report LBP that has now been more bothersome, of which she relates to pain at knee. Patient to beneift from PT to focus on general strengthening and flexibility to allow for improved functional mobility with reduced pain pain for improved QOL.    Clinical Presentation Stable   Clinical Decision Making Low   Rehab Potential Good   PT Frequency 2x / week   PT Duration 6 weeks   PT Treatment/Interventions ADLs/Self Care Home Management;Cryotherapy;Electrical Stimulation;Iontophoresis 4mg /ml Dexamethasone;Moist Heat;Ultrasound;Neuromuscular re-education;Balance training;Therapeutic exercise;Therapeutic activities;Functional mobility training;Stair training;Gait training;Patient/family education;Manual techniques;Passive range of motion;Vasopneumatic Device;Taping;Dry needling   Consulted and Agree with Plan of Care Patient      Patient will benefit from skilled  therapeutic intervention in order to improve the following deficits and impairments:  Decreased activity tolerance, Decreased range of motion, Decreased mobility, Decreased strength, Difficulty walking, Pain  Visit Diagnosis: Chronic pain of right knee - Plan: PT plan of care cert/re-cert  Difficulty in walking, not elsewhere classified - Plan: PT plan of care cert/re-cert  Other symptoms and signs involving the musculoskeletal system - Plan: PT plan of care cert/re-cert      G-Codes - 51/76/16 1313    Functional Assessment Tool Used (Outpatient Only) FOTO: 41(59% limited)   Functional Limitation Mobility: Walking and moving around   Mobility: Walking and Moving Around Current Status ([id number]) At least 40 percent but less than 60 percent impaired, limited or restricted   Mobility: Walking and Moving Around Goal Status [phone]) At least 40 percent but less than 60 percent impaired, limited or restricted       Problem List Patient Active Problem List   Diagnosis Date Noted  . 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 Osteopenia   . Depression 08/25/2016  . Insomnia 11/28/2015  . Family history of uterine cancer   . Vaginal dryness, menopausal 12/12/2014  . Hyperglycemia 01/07/2014  . Osteoarthritis, knee 02/16/2013  . Routine general medical examination at a health care facility 03/27/2012  . Adenocarcinoma of colon ([place]) 04/12/2011  . Morbid obesity ([place]) 02/10/2011  . HTN (hypertension) 02/10/2011     Lanney Gins, PT, DPT 05/10/17 1:15 PM   [facility] [address]  [place], [place], [address] Phone: [phone]   Fax:  [phone]  Name: Leianne  MAKAYLIA HEWETT MRN: [id number] Date of Birth: 02-26-1951

## 2017-05-10 NOTE — Patient Instructions (Signed)
Hamstring Step 2   Left foot relaxed, knee straight, other leg bent, foot flat. Raise straight leg further upward to maximal range. Hold __30_ seconds. Relax leg completely down. Repeat _3__ times.  Short Arc [employer] a large can or rolled towel under leg. Straighten knee and leg. Hold _5___ seconds. Repeat with other leg. Repeat _15___ times. Do _2___ sessions per day.  Strengthening: Straight Leg Raise (Phase 1)   Tighten muscles on front of right thigh, then lift leg __6-8__ inches from surface, keeping knee locked.  Repeat _10-15___ times per set. Do __2__ sets per session.   Long [employer]   Straighten operated leg and try to hold it __5__ seconds.  Repeat _10-15___ times. Do __2__ sessions a day. **squeeze ball/towel/pillow between knees**

## 2017-05-16 ENCOUNTER — Ambulatory Visit: Payer: PPO

## 2017-05-16 ENCOUNTER — Ambulatory Visit: Admitting: Oncology

## 2017-05-16 ENCOUNTER — Other Ambulatory Visit

## 2017-05-17 ENCOUNTER — Ambulatory Visit
Admission: RE | Admit: 2017-05-17 | Discharge: 2017-05-17 | Disposition: A | Discharge status: Home / Self Care | Payer: PPO | Source: Ambulatory Visit | Attending: Family | Admitting: Family

## 2017-05-17 DIAGNOSIS — Z1231 Encounter for screening mammogram for malignant neoplasm of breast: Secondary | ICD-10-CM | POA: Diagnosis not present

## 2017-05-18 ENCOUNTER — Ambulatory Visit: Payer: PPO | Admitting: Physical Therapy

## 2017-05-18 DIAGNOSIS — M25561 Pain in right knee: Secondary | ICD-10-CM | POA: Diagnosis not present

## 2017-05-18 DIAGNOSIS — R262 Difficulty in walking, not elsewhere classified: Secondary | ICD-10-CM

## 2017-05-18 DIAGNOSIS — R29898 Other symptoms and signs involving the musculoskeletal system: Secondary | ICD-10-CM

## 2017-05-18 DIAGNOSIS — G8929 Other chronic pain: Secondary | ICD-10-CM

## 2017-05-18 NOTE — Therapy (Signed)
[facility] [address]  [place], [place], [address] Phone: [phone]   Fax:  [phone]  Physical Therapy Treatment  Patient Details  Name: Jasmine [place] MRN: [id number] Date of Birth: [birth year] Referring Provider: Erskine Emery, PA-C  Encounter Date: 05/18/2017      PT End of Session - 05/18/17 0936    Visit Number 2   Number of Visits 12   Date for PT Re-Evaluation 06/21/17   Authorization Type [employer]   PT Start Time 0933   PT Stop Time 1012   PT Time Calculation (min) 39 min   Activity Tolerance Patient tolerated treatment well   Behavior During Therapy [facility] for tasks assessed/performed      Past Medical History:  Diagnosis Date  . Adenocarcinoma of colon ([place]) 04/12/2011   "rectal-colon"  . Corneal ulcers and infections 07/04/12  . Degenerative joint disease    right knee  . DVT (deep vein thrombosis) in pregnancy ([place]) 1979   during pregnancy. lower extremity  . Family history of colon cancer   . Family history of uterine cancer   . History of chemotherapy     cycle 5 Folfox completed 09/23/11  . History of radiation therapy 10/25/11 -12/01/11   pelvis/presacral region  . Morbid obesity ([place])   . Osteopenia   . 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 Stress incontinence, female    not current    Past Surgical History:  Procedure Laterality Date  . ABDOMINAL HYSTERECTOMY  3 years ago  . BREAST CYST EXCISION  1978  . CESAREAN SECTION    . COLON SURGERY     LAR, reoperation for necrotic ileostomy, transverse colostomy  . COLOSTOMY  05/27/11  . COLOSTOMY TAKEDOWN  07/11/2012   Procedure: COLOSTOMY TAKEDOWN;  Surgeon: Rolm Bookbinder, MD;  Location: [facility];  Service: General;  Laterality: N/A;  colostomy takedown  . JOINT REPLACEMENT  2002   hip replacement  . [place]  . MASS EXCISION  2011   "mass  removed from ovaries"  . PORT-A-CATH REMOVAL  07/11/2012   Procedure: REMOVAL PORT-A-CATH;  Surgeon: Rolm Bookbinder, MD;  Location: [facility];  Service: General;  Laterality: N/A;  removal portacath  . PORTACATH PLACEMENT  07/26/11   right portacath  . TOTAL ABDOMINAL HYSTERECTOMY W/ BILATERAL SALPINGOOPHORECTOMY  11/30/09   BSO  . TOTAL HIP ARTHROPLASTY  2002   left hip    There were no vitals filed for this visit.      Subjective Assessment - 05/18/17 0936    Subjective feeling well this morning   Diagnostic tests Xray: arthritis per patient   Patient Stated Goals improve pain, walk without pain   Currently in Pain? Yes   Pain Score 3    Pain Location Knee   Pain Orientation Right   Pain Descriptors / Indicators Sore;Aching   Pain Type Chronic pain                         [facility] Adult PT Treatment/Exercise - 05/18/17 0938      Knee/Hip Exercises: Stretches   Passive Hamstring Stretch Right;3 reps;30 seconds   Passive Hamstring Stretch Limitations supine with strap     Knee/Hip Exercises: Aerobic   Nustep L4 x 6 min     Knee/Hip Exercises: Standing   Heel Raises Both;15 reps   Heel Raises  Limitations Toe raises x 15 reps   Hip Abduction Stengthening;Both;10 reps;Knee straight   Hip Extension Stengthening;Both;10 reps;Knee straight   Functional Squat 15 reps     Knee/Hip Exercises: Seated   Long Arc Quad Strengthening;Right;15 reps;Weights   Long Arc Quad Weight 2 lbs.   Long [employer] Limitations with ball squeeze   Hamstring Curl Strengthening;Right;15 reps;Weights   Hamstring Limitations 2# - red tband     Knee/Hip Exercises: Supine   Bridges Limitations 15 reps   Straight Leg Raises Strengthening;Right;15 reps   Straight Leg Raises Limitations 2#   Straight Leg Raise with External Rotation Strengthening;Right;10 reps                  PT Short Term Goals - 05/18/17 4098      PT SHORT TERM GOAL #1   Title patient to be independent  with initial HEP   Status On-going     PT SHORT TERM GOAL #2   Title patient to demonstrate R knee AROM 0-115   Status On-going           PT Long Term Goals - 05/18/17 1191      PT LONG TERM GOAL #1   Title patient to be independent with advanced HEP   Status On-going     PT LONG TERM GOAL #2   Title Patient to improve R LE strength to >/= 4+/5   Status On-going     PT LONG TERM GOAL #3   Title Patient to report ability to ambulate for >/= 30 min without pain limiting   Status On-going     PT LONG TERM GOAL #4   Title patient to report initiation and maintenance of walking program for exercise on most days of the week   Status On-going               Plan - 05/18/17 4782    Clinical Impression Statement Patient doing well today - reporting limited copliance wiht HEP. Education on how to incorporate movements into daily activities with good carryover. Patient tolerating all tasks with no issue other than general muscle fatigue. WIll continue to progress as patient tolerates.    PT Treatment/Interventions ADLs/Self Care Home Management;Cryotherapy;Electrical Stimulation;Iontophoresis 4mg /ml Dexamethasone;Moist Heat;Ultrasound;Neuromuscular re-education;Balance training;Therapeutic exercise;Therapeutic activities;Functional mobility training;Stair training;Gait training;Patient/family education;Manual techniques;Passive range of motion;Vasopneumatic Device;Taping;Dry needling   Consulted and Agree with Plan of Care Patient      Patient will benefit from skilled therapeutic intervention in order to improve the following deficits and impairments:  Decreased activity tolerance, Decreased range of motion, Decreased mobility, Decreased strength, Difficulty walking, Pain  Visit Diagnosis: Chronic pain of right knee  Difficulty in walking, not elsewhere classified  Other symptoms and signs involving the musculoskeletal system     Problem List Patient Active Problem  List   Diagnosis Date Noted  . 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 Osteopenia   . Depression 08/25/2016  . Insomnia 11/28/2015  . Family history of uterine cancer   . Vaginal dryness, menopausal 12/12/2014  . Hyperglycemia 01/07/2014  . Osteoarthritis, knee 02/16/2013  . Routine general medical examination at a health care facility 03/27/2012  . Adenocarcinoma of colon ([place]) 04/12/2011  . Morbid obesity ([place]) 02/10/2011  . HTN (hypertension) 02/10/2011     Lanney Gins, PT, DPT 05/18/17 10:36 AM   [facility] [place], [place], [address] Phone: [phone]   Fax:  [phone]  Name: Jasmine [place] MRN: [id number] Date of Birth: 1950-12-21

## 2017-05-18 NOTE — Patient Instructions (Signed)
Mini Squat: Double Leg   With feet shoulder width apart, reach forward for balance and do a mini squat. Keep knees in line with second toe. Knees do not go past toes. Repeat _15__ times per set.   EXTENSION: Standing (Active)   Stand, both feet flat. Draw right leg behind body as far as possible.  Complete _1-2__ sets of __15_ repetitions.

## 2017-05-24 ENCOUNTER — Ambulatory Visit: Payer: PPO | Admitting: Physical Therapy

## 2017-05-24 DIAGNOSIS — M25561 Pain in right knee: Secondary | ICD-10-CM | POA: Diagnosis not present

## 2017-05-24 DIAGNOSIS — R262 Difficulty in walking, not elsewhere classified: Secondary | ICD-10-CM

## 2017-05-24 DIAGNOSIS — G8929 Other chronic pain: Secondary | ICD-10-CM

## 2017-05-24 DIAGNOSIS — R29898 Other symptoms and signs involving the musculoskeletal system: Secondary | ICD-10-CM

## 2017-05-24 NOTE — Patient Instructions (Signed)
Mini Squat: Double Leg    With feet shoulder width apart, reach forward for balance and do a mini squat. Keep knees in line with second toe. Knees do not go past toes. Repeat _15__ times per set.

## 2017-05-24 NOTE — Therapy (Signed)
Jasmine Clayton [address]  [place], [place], [address] Phone: [phone]   Fax:  [phone]  Physical Therapy Treatment  Patient Details  Name: Jasmine Clayton MRN: [id number] Date of Birth: 04-02-1951 Referring Provider: Erskine Emery, PA-C  Encounter Date: 05/24/2017      PT End of Session - 05/24/17 1011    Visit Number 3   Number of Visits 12   Date for PT Re-Evaluation 06/21/17   Authorization Type [employer]   PT Start Time 1007   PT Stop Time 1048   PT Time Calculation (min) 41 min   Activity Tolerance Patient tolerated treatment well   Behavior During Therapy [facility] for tasks assessed/performed      Past Medical History:  Diagnosis Date  . Adenocarcinoma of colon ([place]) 04/12/2011   "rectal-colon"  . Corneal ulcers and infections 07/04/12  . Degenerative joint disease    right knee  . DVT (deep vein thrombosis) in pregnancy ([place]) 1979   during pregnancy. lower extremity  . Family history of colon cancer   . Family history of uterine cancer   . History of chemotherapy     cycle 5 Folfox completed 09/23/11  . History of radiation therapy 10/25/11 -12/01/11   pelvis/presacral region  . Morbid obesity ([place])   . Osteopenia   . 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 Stress incontinence, female    not current    Past Surgical History:  Procedure Laterality Date  . ABDOMINAL HYSTERECTOMY  3 years ago  . BREAST CYST EXCISION  1978  . CESAREAN SECTION    . COLON SURGERY     LAR, reoperation for necrotic ileostomy, transverse colostomy  . COLOSTOMY  05/27/11  . COLOSTOMY TAKEDOWN  07/11/2012   Procedure: COLOSTOMY TAKEDOWN;  Surgeon: Rolm Bookbinder, MD;  [facility];  Service: General;  Laterality: N/A;  colostomy takedown  . JOINT REPLACEMENT  2002   hip replacement  . [place]  . MASS EXCISION  2011   "mass  removed from ovaries"  . PORT-A-CATH REMOVAL  07/11/2012   Procedure: REMOVAL PORT-A-CATH;  Surgeon: Rolm Bookbinder, MD;  [facility];  Service: General;  Laterality: N/A;  removal portacath  . PORTACATH PLACEMENT  07/26/11   right portacath  . TOTAL ABDOMINAL HYSTERECTOMY W/ BILATERAL SALPINGOOPHORECTOMY  11/30/09   BSO  . TOTAL HIP ARTHROPLASTY  2002   left hip    There were no vitals filed for this visit.      Subjective Assessment - 05/24/17 1010    Subjective feeling well - no new complaints   Pertinent History colon cancer, HTN, L THA   Diagnostic tests Xray: arthritis per patient   Patient Stated Goals improve pain, walk without pain   Currently in Pain? Yes   Pain Score 2    Pain Location Knee   Pain Orientation Right   Pain Descriptors / Indicators Aching;Sore   Pain Type Chronic pain                         [facility] Adult PT Treatment/Exercise - 05/24/17 1012      Knee/Hip Exercises: Aerobic   Nustep L5 x 6 min     Knee/Hip Exercises: Standing   Hip Flexion Stengthening;Both;10 reps;Knee straight   Hip Flexion Limitations red tband - B UE support   Hip ADduction Strengthening;Both;10  reps   Hip ADduction Limitations red tband - B UE support   Hip Abduction Stengthening;Both;10 reps;Knee straight   Abduction Limitations red tband - B UE support   Hip Extension Stengthening;Both;10 reps;Knee straight   Extension Limitations red tband - B UE support   Step Down Both;15 reps;Hand Hold: 2;Step Height: 4"   Step Down Limitations eccentric   Functional Squat 15 reps   Functional Squat Limitations B UE support at counter     Knee/Hip Exercises: [place] Quad Strengthening;15 reps;Weights;Both   Long Arc Quad Weight 2 lbs.   Long Arc Quad Limitations with ball squeeze   Hamstring Curl Strengthening;Both;15 reps   Hamstring Limitations green tband                  PT Short Term Goals - 05/18/17 0937      PT SHORT TERM  GOAL #1   Title patient to be independent with initial HEP   Status On-going     PT SHORT TERM GOAL #2   Title patient to demonstrate R knee AROM 0-115   Status On-going           PT Long Term Goals - 05/18/17 4098      PT LONG TERM GOAL #1   Title patient to be independent with advanced HEP   Status On-going     PT LONG TERM GOAL #2   Title Patient to improve R LE strength to >/= 4+/5   Status On-going     PT LONG TERM GOAL #3   Title Patient to report ability to ambulate for >/= 30 min without pain limiting   Status On-going     PT LONG TERM GOAL #4   Title patient to report initiation and maintenance of walking program for exercise on most days of the week   Status On-going               Plan - 05/24/17 1011    Clinical Impression Statement Patient doing well today. Doing well with all strengthening activities in weight bearing. Noted muscle fatigue after set requiring seated rest breaks. Patient with updated HEP today to include squats to promote functional movements. Will continue to progress as patient tolerates.    PT Treatment/Interventions ADLs/Self Care Home Management;Cryotherapy;Electrical Stimulation;Iontophoresis 4mg /ml Dexamethasone;Moist Heat;Ultrasound;Neuromuscular re-education;Balance training;Therapeutic exercise;Therapeutic activities;Functional mobility training;Stair training;Gait training;Patient/family education;Manual techniques;Passive range of motion;Vasopneumatic Device;Taping;Dry needling   Consulted and Agree with Plan of Care Patient      Patient will benefit from skilled therapeutic intervention in order to improve the following deficits and impairments:  Decreased activity tolerance, Decreased range of motion, Decreased mobility, Decreased strength, Difficulty walking, Pain  Visit Diagnosis: Chronic pain of right knee  Difficulty in walking, not elsewhere classified  Other symptoms and signs involving the musculoskeletal  system     Problem List Patient Active Problem List   Diagnosis Date Noted  . 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 Osteopenia   . Depression 08/25/2016  . Insomnia 11/28/2015  . Family history of uterine cancer   . Vaginal dryness, menopausal 12/12/2014  . Hyperglycemia 01/07/2014  . Osteoarthritis, knee 02/16/2013  . Routine general medical examination at a health care facility 03/27/2012  . Adenocarcinoma of colon ([place]) 04/12/2011  . Morbid obesity ([place]) 02/10/2011  . HTN (hypertension) 02/10/2011     Lanney Gins, PT, DPT 05/24/17 10:51 AM   [facility] [address]  [place], [place], [address] Phone: [phone]   Fax:  [phone]  Name: DIASHA CASTLEMAN MRN: [id number] Date of Birth: 04/23/1951

## 2017-05-26 ENCOUNTER — Ambulatory Visit: Payer: PPO | Admitting: Physical Therapy

## 2017-05-26 DIAGNOSIS — R29898 Other symptoms and signs involving the musculoskeletal system: Secondary | ICD-10-CM

## 2017-05-26 DIAGNOSIS — M25561 Pain in right knee: Principal | ICD-10-CM

## 2017-05-26 DIAGNOSIS — G8929 Other chronic pain: Secondary | ICD-10-CM

## 2017-05-26 DIAGNOSIS — R262 Difficulty in walking, not elsewhere classified: Secondary | ICD-10-CM

## 2017-05-26 NOTE — Patient Instructions (Signed)
Balance, Proprioception: Hip Flexion With Tubing    With tubing attached to ankle of uninvolved leg, swing leg forward. Return. Repeat _15___ times. Do _1-2___ sessions per day.  ADDUCTION: Standing - Stable: Resistance Band (Active)    Stand, right leg out to side as far as possible. Against yellow resistance band, draw leg in across midline. Complete _15__ repetitions. Perform _1-2__ sessions per day.  Balance, Proprioception: Hip Extension With Tubing    With tubing attached to ankle of uninvolved leg, swing leg back. Return. Repeat _15___ times. Do _1-2___ sessions per day.  (Home) Balance: With Hip Abduction - Unilateral Stance (Resist)    Opposite side toward anchor, strap around left ankle, leg in. Swing leg across body and out to side. Use arm support only as needed for balance. Repeat _15___ times per set. Do _1-2___ sessions per day.

## 2017-05-26 NOTE — Therapy (Signed)
[facility] [address]  [place], [place], [address] Phone: [phone]   Fax:  [phone]  Physical Therapy Treatment  Patient Details  Name: Jasmine [facility] MRN: [id number] Date of Birth: 1950-12-16 Referring Provider: Erskine Emery, PA-C  Encounter Date: 05/26/2017      PT End of Session - 05/26/17 1021    Visit Number 4   Number of Visits 12   Date for PT Re-Evaluation 06/21/17   Authorization Type [employer]   PT Start Time 1017   PT Stop Time 1100   PT Time Calculation (min) 43 min   Activity Tolerance Patient tolerated treatment well   Behavior During Therapy [facility] for tasks assessed/performed      Past Medical History:  Diagnosis Date  . Adenocarcinoma of colon ([place]) 04/12/2011   "rectal-colon"  . Corneal ulcers and infections 07/04/12  . Degenerative joint disease    right knee  . DVT (deep vein thrombosis) in pregnancy ([place]) 1979   during pregnancy. lower extremity  . Family history of colon cancer   . Family history of uterine cancer   . History of chemotherapy     cycle 5 Folfox completed 09/23/11  . History of radiation therapy 10/25/11 -12/01/11   pelvis/presacral region  . Morbid obesity ([place])   . Osteopenia   . 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 Stress incontinence, female    not current    Past Surgical History:  Procedure Laterality Date  . ABDOMINAL HYSTERECTOMY  3 years ago  . BREAST CYST EXCISION  1978  . CESAREAN SECTION    . COLON SURGERY     LAR, reoperation for necrotic ileostomy, transverse colostomy  . COLOSTOMY  05/27/11  . COLOSTOMY TAKEDOWN  07/11/2012   Procedure: COLOSTOMY TAKEDOWN;  Surgeon: Rolm Bookbinder, MD;  [facility];  Service: General;  Laterality: N/A;  colostomy takedown  . JOINT REPLACEMENT  2002   hip replacement  . [place]  . MASS EXCISION  2011   "mass  removed from ovaries"  . PORT-A-CATH REMOVAL  07/11/2012   Procedure: REMOVAL PORT-A-CATH;  Surgeon: Rolm Bookbinder, MD;  [facility];  Service: General;  Laterality: N/A;  removal portacath  . PORTACATH PLACEMENT  07/26/11   right portacath  . TOTAL ABDOMINAL HYSTERECTOMY W/ BILATERAL SALPINGOOPHORECTOMY  11/30/09   BSO  . TOTAL HIP ARTHROPLASTY  2002   left hip    There were no vitals filed for this visit.      Subjective Assessment - 05/26/17 1019    Subjective Patient reports being very busy since her last visit and being on her feet a lot.    Patient Stated Goals improve pain, walk without pain   Currently in Pain? Yes   Pain Score 4    Pain Location Knee   Pain Orientation Right   Pain Descriptors / Indicators Sharp   Pain Type Chronic pain                         [facility] Adult PT Treatment/Exercise - 05/26/17 1022      Knee/Hip Exercises: Aerobic   Nustep L5 x 6 min     Knee/Hip Exercises: Machines for Strengthening   Cybex Knee Extension 10#, B concentric/R/L eccentric x10 each side     Knee/Hip Exercises: Standing   Hip Flexion Stengthening;Both;15 reps;Knee straight  Hip Flexion Limitations red tband - B UE support   Hip ADduction Strengthening;Both;15 reps   Hip ADduction Limitations red tband - B UE support   Hip Abduction Stengthening;Both;15 reps;Knee straight   Abduction Limitations red tband - B UE support   Hip Extension Stengthening;Both;15 reps;Knee straight   Extension Limitations red tband - B UE support   Step Down Both;15 reps;Hand Hold: 2;Step Height: 6"   Step Down Limitations eccentric; not full reach to floor   Wall Squat 15 reps   Wall Squat Limitations with orange p-ball against wall, one required standing rest break                  PT Short Term Goals - 05/18/17 5701      PT SHORT TERM GOAL #1   Title patient to be independent with initial HEP   Status On-going     PT SHORT TERM GOAL #2   Title  patient to demonstrate R knee AROM 0-115   Status On-going           PT Long Term Goals - 05/18/17 7793      PT LONG TERM GOAL #1   Title patient to be independent with advanced HEP   Status On-going     PT LONG TERM GOAL #2   Title Patient to improve R LE strength to >/= 4+/5   Status On-going     PT LONG TERM GOAL #3   Title Patient to report ability to ambulate for >/= 30 min without pain limiting   Status On-going     PT LONG TERM GOAL #4   Title patient to report initiation and maintenance of walking program for exercise on most days of the week   Status On-going               Plan - 05/26/17 1022    Clinical Impression Statement Patient progressing well with strengthening exercises. Required seated rest breaks following each exercise due to muscle fatigue. Patient reported minor joint pain in left hip when in single leg stance on LLE. Updated patient HEP to include 4 way hip exercises with tband to improve strength and promote funcitonal movements. Will continue to progress as patiet tolerates.   PT Treatment/Interventions ADLs/Self Care Home Management;Cryotherapy;Electrical Stimulation;Iontophoresis 4mg /ml Dexamethasone;Moist Heat;Ultrasound;Neuromuscular re-education;Balance training;Therapeutic exercise;Therapeutic activities;Functional mobility training;Stair training;Gait training;Patient/family education;Manual techniques;Passive range of motion;Vasopneumatic Device;Taping;Dry needling   Consulted and Agree with Plan of Care Patient      Patient will benefit from skilled therapeutic intervention in order to improve the following deficits and impairments:  Decreased activity tolerance, Decreased range of motion, Decreased mobility, Decreased strength, Difficulty walking, Pain  Visit Diagnosis: Chronic pain of right knee  Difficulty in walking, not elsewhere classified  Other symptoms and signs involving the musculoskeletal system     Problem  List Patient Active Problem List   Diagnosis Date Noted  . 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 Osteopenia   . Depression 08/25/2016  . Insomnia 11/28/2015  . Family history of uterine cancer   . Vaginal dryness, menopausal 12/12/2014  . Hyperglycemia 01/07/2014  . Osteoarthritis, knee 02/16/2013  . Routine general medical examination at a health care facility 03/27/2012  . Adenocarcinoma of colon ([place]) 04/12/2011  . Morbid obesity ([place]) 02/10/2011  . HTN (hypertension) 02/10/2011     Lanney Gins, PT, DPT 05/26/17 12:03 PM  [facility] [address]  [address] [place]  [place], [place], [address] Phone: [phone]   Fax:  [phone]  Name: BAY JARQUIN MRN: [id number] Date of Birth: 04-05-1951

## 2017-05-31 ENCOUNTER — Ambulatory Visit: Payer: PPO | Admitting: Physical Therapy

## 2017-05-31 DIAGNOSIS — R262 Difficulty in walking, not elsewhere classified: Secondary | ICD-10-CM

## 2017-05-31 DIAGNOSIS — M25561 Pain in right knee: Principal | ICD-10-CM

## 2017-05-31 DIAGNOSIS — G8929 Other chronic pain: Secondary | ICD-10-CM

## 2017-05-31 DIAGNOSIS — R29898 Other symptoms and signs involving the musculoskeletal system: Secondary | ICD-10-CM

## 2017-05-31 NOTE — Therapy (Signed)
[facility] [address]  [place], [place], [address] Phone: [phone]   Fax:  [phone]  Physical Therapy Treatment  Patient Details  Name: Jasmine Clayton MRN: [id number] Date of Birth: [birth year] Referring Provider: Erskine Emery, PA-C  Encounter Date: 05/31/2017      PT End of Session - 05/31/17 1058    Visit Number 5   Number of Visits 12   Date for PT Re-Evaluation 06/21/17   Authorization Type [employer]   PT Start Time 1011   PT Stop Time 1106   PT Time Calculation (min) 55 min   Activity Tolerance Patient tolerated treatment well   Behavior During Therapy [facility] for tasks assessed/performed      Past Medical History:  Diagnosis Date  . Adenocarcinoma of colon ([place]) 04/12/2011   "rectal-colon"  . Corneal ulcers and infections 07/04/12  . Degenerative joint disease    right knee  . DVT (deep vein thrombosis) in pregnancy ([place]) 1979   during pregnancy. lower extremity  . Family history of colon cancer   . Family history of uterine cancer   . History of chemotherapy     cycle 5 Folfox completed 09/23/11  . History of radiation therapy 10/25/11 -12/01/11   pelvis/presacral region  . Morbid obesity ([place])   . Osteopenia   . 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 Stress incontinence, female    not current    Past Surgical History:  Procedure Laterality Date  . ABDOMINAL HYSTERECTOMY  3 years ago  . BREAST CYST EXCISION  1978  . CESAREAN SECTION    . COLON SURGERY     LAR, reoperation for necrotic ileostomy, transverse colostomy  . COLOSTOMY  05/27/11  . COLOSTOMY TAKEDOWN  07/11/2012   Procedure: COLOSTOMY TAKEDOWN;  Surgeon: Rolm Bookbinder, MD;  Location: [facility];  Service: General;  Laterality: N/A;  colostomy takedown  . JOINT REPLACEMENT  2002   hip replacement  . [place]  . MASS EXCISION  2011   "mass  removed from ovaries"  . PORT-A-CATH REMOVAL  07/11/2012   Procedure: REMOVAL PORT-A-CATH;  Surgeon: Rolm Bookbinder, MD;  Location: [facility];  Service: General;  Laterality: N/A;  removal portacath  . PORTACATH PLACEMENT  07/26/11   right portacath  . TOTAL ABDOMINAL HYSTERECTOMY W/ BILATERAL SALPINGOOPHORECTOMY  11/30/09   BSO  . TOTAL HIP ARTHROPLASTY  2002   left hip    There were no vitals filed for this visit.      Subjective Assessment - 05/31/17 1012    Subjective Patient was very busy for the entire weekend but reports knee felt good. Reports good compliance with HEP and very little pain today.    Pain Score 2    Pain Location Knee   Pain Orientation Right   Pain Descriptors / Indicators Aching   Pain Type Chronic pain                         [facility] Adult PT Treatment/Exercise - 05/31/17 1016      Knee/Hip Exercises: Stretches   Passive Hamstring Stretch Right;Left;2 reps;30 seconds   Passive Hamstring Stretch Limitations supine with strap     Knee/Hip Exercises: Aerobic   Nustep L5 x 6 min     Knee/Hip Exercises: Machines for Strengthening   Cybex Knee Flexion 15#, B concentric; R/L eccentric; 10 reps  each   Cybex Leg Press 20#; 15 reps     Knee/Hip Exercises: Standing   Step Down Both;15 reps;Hand Hold: 2;Step Height: 6"   Step Down Limitations eccentric   Wall Squat 15 reps   Wall Squat Limitations ball between knees     Knee/Hip Exercises: Sidelying   Hip ABduction Strengthening;Right;Left;2 sets;10 reps   Hip ABduction Limitations 2#     Modalities   Modalities Iontophoresis     Iontophoresis   Type of Iontophoresis Dexamethasone   Location R medial knee   Dose 1.0 mL   Time 80 mA; 4-6hrs                  PT Short Term Goals - 05/18/17 1245      PT SHORT TERM GOAL #1   Title patient to be independent with initial HEP   Status On-going     PT SHORT TERM GOAL #2   Title patient to demonstrate R knee AROM 0-115    Status On-going           PT Long Term Goals - 05/18/17 8099      PT LONG TERM GOAL #1   Title patient to be independent with advanced HEP   Status On-going     PT LONG TERM GOAL #2   Title Patient to improve R LE strength to >/= 4+/5   Status On-going     PT LONG TERM GOAL #3   Title Patient to report ability to ambulate for >/= 30 min without pain limiting   Status On-going     PT LONG TERM GOAL #4   Title patient to report initiation and maintenance of walking program for exercise on most days of the week   Status On-going               Plan - 05/31/17 1114    Clinical Impression Statement Patient doing very well today. Reporting very little pain and noticeable improvements in strength with all LE exercises. Patient requested to try iontophoresis treatment on R knee. Will assess patient response to ionto at next visit and continue to progress LE strengthening as patient tolerates.    PT Treatment/Interventions ADLs/Self Care Home Management;Cryotherapy;Electrical Stimulation;Iontophoresis 4mg /ml Dexamethasone;Moist Heat;Ultrasound;Neuromuscular re-education;Balance training;Therapeutic exercise;Therapeutic activities;Functional mobility training;Stair training;Gait training;Patient/family education;Manual techniques;Passive range of motion;Vasopneumatic Device;Taping;Dry needling   Consulted and Agree with Plan of Care Patient      Patient will benefit from skilled therapeutic intervention in order to improve the following deficits and impairments:  Decreased activity tolerance, Decreased range of motion, Decreased mobility, Decreased strength, Difficulty walking, Pain  Visit Diagnosis: Chronic pain of right knee  Difficulty in walking, not elsewhere classified  Other symptoms and signs involving the musculoskeletal system     Problem List Patient Active Problem List   Diagnosis Date Noted  . Chronic bilateral low back pain with right-sided sciatica  04/05/2017  . Osteopenia   . Depression 08/25/2016  . Insomnia 11/28/2015  . Family history of uterine cancer   . Vaginal dryness, menopausal 12/12/2014  . Hyperglycemia 01/07/2014  . Osteoarthritis, knee 02/16/2013  . Routine general medical examination at a health care facility 03/27/2012  . Adenocarcinoma of colon ([place]) 04/12/2011  . Morbid obesity ([place]) 02/10/2011  . HTN (hypertension) 02/10/2011     Mickle Asper, SPT 05/31/17 11:26 AM    [facility] [address]  [place], [place], [address] Phone: [phone]   Fax:  [phone]  Name: REBBIE LAURICELLA MRN: [id number] Date of Birth: 01-31-1951

## 2017-06-02 ENCOUNTER — Ambulatory Visit: Payer: PPO | Attending: Physician Assistant | Admitting: Physical Therapy

## 2017-06-02 DIAGNOSIS — G8929 Other chronic pain: Secondary | ICD-10-CM | POA: Diagnosis not present

## 2017-06-02 DIAGNOSIS — M25561 Pain in right knee: Secondary | ICD-10-CM | POA: Diagnosis not present

## 2017-06-02 DIAGNOSIS — R29898 Other symptoms and signs involving the musculoskeletal system: Secondary | ICD-10-CM | POA: Diagnosis not present

## 2017-06-02 DIAGNOSIS — R262 Difficulty in walking, not elsewhere classified: Secondary | ICD-10-CM | POA: Diagnosis not present

## 2017-06-02 NOTE — Therapy (Signed)
[facility] [address]  [place], [place], [address] Phone: [phone]   Fax:  [phone]  Physical Therapy Treatment  Patient Details  Name: Jasmine Clayton MRN: [id number] Date of Birth: 21-Nov-1950 Referring Provider: Erskine Emery, PA-C  Encounter Date: 06/02/2017      PT End of Session - 06/02/17 1020    Visit Number 6   Number of Visits 12   Date for PT Re-Evaluation 06/21/17   Authorization Type [employer]   PT Start Time 1016   PT Stop Time 1100   PT Time Calculation (min) 44 min   Activity Tolerance Patient tolerated treatment well   Behavior During Therapy [facility] for tasks assessed/performed      Past Medical History:  Diagnosis Date  . Adenocarcinoma of colon ([place]) 04/12/2011   "rectal-colon"  . Corneal ulcers and infections 07/04/12  . Degenerative joint disease    right knee  . DVT (deep vein thrombosis) in pregnancy ([place]) 1979   during pregnancy. lower extremity  . Family history of colon cancer   . Family history of uterine cancer   . History of chemotherapy     cycle 5 Folfox completed 09/23/11  . History of radiation therapy 10/25/11 -12/01/11   pelvis/presacral region  . Morbid obesity ([place])   . Osteopenia   . 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 Stress incontinence, female    not current    Past Surgical History:  Procedure Laterality Date  . ABDOMINAL HYSTERECTOMY  3 years ago  . BREAST CYST EXCISION  1978  . CESAREAN SECTION    . COLON SURGERY     LAR, reoperation for necrotic ileostomy, transverse colostomy  . COLOSTOMY  05/27/11  . COLOSTOMY TAKEDOWN  07/11/2012   Procedure: COLOSTOMY TAKEDOWN;  Surgeon: Rolm Bookbinder, MD;  Location: [facility];  Service: General;  Laterality: N/A;  colostomy takedown  . JOINT REPLACEMENT  2002   hip replacement  . [place]  . MASS EXCISION  2011   "mass  removed from ovaries"  . PORT-A-CATH REMOVAL  07/11/2012   Procedure: REMOVAL PORT-A-CATH;  Surgeon: Rolm Bookbinder, MD;  Location: [facility];  Service: General;  Laterality: N/A;  removal portacath  . PORTACATH PLACEMENT  07/26/11   right portacath  . TOTAL ABDOMINAL HYSTERECTOMY W/ BILATERAL SALPINGOOPHORECTOMY  11/30/09   BSO  . TOTAL HIP ARTHROPLASTY  2002   left hip    There were no vitals filed for this visit.      Subjective Assessment - 06/02/17 1019    Subjective been busy with child care - feeling well this morning   Pertinent History colon cancer, HTN, L THA   Diagnostic tests Xray: arthritis per patient   Patient Stated Goals improve pain, walk without pain   Currently in Pain? Yes   Pain Score 1    Pain Location Knee   Pain Orientation Right   Pain Descriptors / Indicators Sore   Pain Type Chronic pain            [facility] PT Assessment - 06/02/17 0001      AROM   Right Knee Extension 0   Right Knee Flexion 113                     [facility] Adult PT Treatment/Exercise - 06/02/17 0001      Knee/Hip Exercises: Stretches  Other Knee/Hip Stretches roller stick to R anterior thigh     Knee/Hip Exercises: Aerobic   Nustep L6 x 6 min     Knee/Hip Exercises: Machines for Strengthening   Cybex Knee Extension 25# B LE 2 x 10    Cybex Knee Flexion 25# B LE 2 x 10     Knee/Hip Exercises: Supine   Bridges Limitations 15 reps - green tband at knees   Straight Leg Raises Strengthening;Right;15 reps   Straight Leg Raises Limitations 3#   Straight Leg Raise with External Rotation Strengthening;Right;15 reps   Straight Leg Raise with External Rotation Limitations 3#   Other Supine Knee/Hip Exercises HS bridge on peanut ball x 15 reps                  PT Short Term Goals - 06/02/17 1021      PT SHORT TERM GOAL #1   Title patient to be independent with initial HEP   Status Achieved     PT SHORT TERM GOAL #2   Title patient to demonstrate R  knee AROM 0-115           PT Long Term Goals - 06/02/17 1021      PT LONG TERM GOAL #1   Title patient to be independent with advanced HEP   Status On-going     PT LONG TERM GOAL #2   Title Patient to improve R LE strength to >/= 4+/5   Status On-going     PT LONG TERM GOAL #3   Title Patient to report ability to ambulate for >/= 30 min without pain limiting   Status On-going     PT LONG TERM GOAL #4   Title patient to report initiation and maintenance of walking program for exercise on most days of the week   Status On-going               Plan - 06/02/17 1021    Clinical Impression Statement Patient and PT continuing to note gains in both strength and mobility with reduction in pain levels. Patient doing well with continued strengthening progressions with no issue. Patient to continue to progress towards goals.    PT Treatment/Interventions ADLs/Self Care Home Management;Cryotherapy;Electrical Stimulation;Iontophoresis 4mg /ml Dexamethasone;Moist Heat;Ultrasound;Neuromuscular re-education;Balance training;Therapeutic exercise;Therapeutic activities;Functional mobility training;Stair training;Gait training;Patient/family education;Manual techniques;Passive range of motion;Vasopneumatic Device;Taping;Dry needling   Consulted and Agree with Plan of Care Patient      Patient will benefit from skilled therapeutic intervention in order to improve the following deficits and impairments:  Decreased activity tolerance, Decreased range of motion, Decreased mobility, Decreased strength, Difficulty walking, Pain  Visit Diagnosis: Chronic pain of right knee  Difficulty in walking, not elsewhere classified  Other symptoms and signs involving the musculoskeletal system     Problem List Patient Active Problem List   Diagnosis Date Noted  . 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 Osteopenia   . Depression 08/25/2016  . Insomnia 11/28/2015  .  Family history of uterine cancer   . Vaginal dryness, menopausal 12/12/2014  . Hyperglycemia 01/07/2014  . Osteoarthritis, knee 02/16/2013  . Routine general medical examination at a health care facility 03/27/2012  . Adenocarcinoma of colon ([place]) 04/12/2011  . Morbid obesity ([place]) 02/10/2011  . HTN (hypertension) 02/10/2011      Lanney Gins, PT, DPT 06/02/17 1:05 PM   [facility] [address]  [place], [place], [address] Phone: [phone]  Fax:  [phone]  Name: Jasmine Clayton MRN: [id number] Date of Birth: 06-13-1951

## 2017-06-07 ENCOUNTER — Ambulatory Visit: Payer: PPO | Admitting: Physical Therapy

## 2017-06-07 ENCOUNTER — Encounter: Payer: Self-pay | Admitting: Physical Therapy

## 2017-06-07 DIAGNOSIS — M25561 Pain in right knee: Secondary | ICD-10-CM | POA: Diagnosis not present

## 2017-06-07 DIAGNOSIS — R262 Difficulty in walking, not elsewhere classified: Secondary | ICD-10-CM

## 2017-06-07 DIAGNOSIS — R29898 Other symptoms and signs involving the musculoskeletal system: Secondary | ICD-10-CM

## 2017-06-07 DIAGNOSIS — G8929 Other chronic pain: Secondary | ICD-10-CM

## 2017-06-07 NOTE — Therapy (Signed)
[facility] [address]  [place], [place], [address] Phone: [phone]   Fax:  [phone]  Physical Therapy Treatment  Patient Details  Name: Jasmine Clayton MRN: [id number] Date of Birth: 10-Jun-1951 Referring Provider: Erskine Emery, PA-C   Encounter Date: 06/07/2017  PT End of Session - 06/07/17 1103    Visit Number  7    Number of Visits  12    Date for PT Re-Evaluation  06/21/17    Authorization Type  [employer]    PT Start Time  1017    PT Stop Time  1101    PT Time Calculation (min)  44 min    Activity Tolerance  Patient tolerated treatment well    Behavior During Therapy  [facility] for tasks assessed/performed       Past Medical History:  Diagnosis Date  . Adenocarcinoma of colon ([place]) 04/12/2011   "rectal-colon"  . Corneal ulcers and infections 07/04/12  . Degenerative joint disease    right knee  . DVT (deep vein thrombosis) in pregnancy ([place]) 1979   during pregnancy. lower extremity  . Family history of colon cancer   . Family history of uterine cancer   . History of chemotherapy     cycle 5 Folfox completed 09/23/11  . History of radiation therapy 10/25/11 -12/01/11   pelvis/presacral region  . Morbid obesity ([place])   . Osteopenia   . 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 Stress incontinence, female    not current    Past Surgical History:  Procedure Laterality Date  . ABDOMINAL HYSTERECTOMY  3 years ago  . BREAST CYST EXCISION  1978  . CESAREAN SECTION    . COLON SURGERY     LAR, reoperation for necrotic ileostomy, transverse colostomy  . COLOSTOMY  05/27/11  . JOINT REPLACEMENT  2002   hip replacement  . [place]  . MASS EXCISION  2011   "mass removed from ovaries"  . PORTACATH PLACEMENT  07/26/11   right portacath  . TOTAL ABDOMINAL HYSTERECTOMY W/ BILATERAL SALPINGOOPHORECTOMY  11/30/09   BSO  . TOTAL HIP  ARTHROPLASTY  2002   left hip    There were no vitals filed for this visit.  Subjective Assessment - 06/07/17 1019    Subjective  Patient doing well this morning, reporting minor pain/ stiffness. Says she has been doing well but unable to do her exercises as much as she would like due to having to care for children.    Pertinent History  colon cancer, HTN, L THA    Currently in Pain?  Yes    Pain Score  2     Pain Location  Knee    Pain Orientation  Right    Pain Descriptors / Indicators  Sore;Tightness    Pain Type  Chronic pain                      [facility] Adult PT Treatment/Exercise - 06/07/17 1021      Exercises   Exercises  Knee/Hip      Knee/Hip Exercises: Stretches   Passive Hamstring Stretch  Right;2 reps;30 seconds    Passive Hamstring Stretch Limitations  supine with strap    ITB Stretch  Right;2 reps;30 seconds    ITB Stretch Limitations  supine with strap      Knee/Hip Exercises: Aerobic   Nustep  L6 x 6 min      Knee/Hip Exercises: Standing   Forward Lunges  Right;Left;2 sets;10 reps 1 UE support at counter   1 UE support at counter   Hip Abduction  Stengthening    Abduction Limitations  side stepping; red tband around feet; 43ft each     Wall Squat  15 reps    Wall Squat Limitations  ball between knees      Knee/Hip Exercises: Supine   Bridges  Both;15 reps    Bridges Limitations  hamstring curl with peanut ball    Bridges with [employer]  Both;15 reps      Knee/Hip Exercises: Prone   Other Prone Exercises  quadruped alt. hip extension; 10 reps each               PT Short Term Goals - 06/02/17 1021      PT SHORT TERM GOAL #1   Title  patient to be independent with initial HEP    Status  Achieved      PT SHORT TERM GOAL #2   Title  patient to demonstrate R knee AROM 0-115        PT Long Term Goals - 06/02/17 1021      PT LONG TERM GOAL #1   Title  patient to be independent with advanced HEP    Status  On-going       PT LONG TERM GOAL #2   Title  Patient to improve R LE strength to >/= 4+/5    Status  On-going      PT LONG TERM GOAL #3   Title  Patient to report ability to ambulate for >/= 30 min without pain limiting    Status  On-going      PT LONG TERM GOAL #4   Title  patient to report initiation and maintenance of walking program for exercise on most days of the week    Status  On-going            Plan - 06/07/17 1104    Clinical Impression Statement  Patient continuing to progress well towards goals. Occassionally patient reports feelings of cramping in R lateral hamstring area, but otherwise no complaints of pain. Continuing to encourage patient to implement more of HEP into daily routine.    PT Treatment/Interventions  ADLs/Self Care Home Management;Cryotherapy;Electrical Stimulation;Iontophoresis 4mg /ml Dexamethasone;Moist Heat;Ultrasound;Neuromuscular re-education;Balance training;Therapeutic exercise;Therapeutic activities;Functional mobility training;Stair training;Gait training;Patient/family education;Manual techniques;Passive range of motion;Vasopneumatic Device;Taping;Dry needling       Patient will benefit from skilled therapeutic intervention in order to improve the following deficits and impairments:  Decreased activity tolerance, Decreased range of motion, Decreased mobility, Decreased strength, Difficulty walking, Pain  Visit Diagnosis: Chronic pain of right knee  Difficulty in walking, not elsewhere classified  Other symptoms and signs involving the musculoskeletal system     Problem List Patient Active Problem List   Diagnosis Date Noted  . 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 Osteopenia   . Depression 08/25/2016  . Insomnia 11/28/2015  . Family history of uterine cancer   . Vaginal dryness, menopausal 12/12/2014  . Hyperglycemia 01/07/2014  . Osteoarthritis, knee 02/16/2013  . Routine general medical examination at a health  care facility 03/27/2012  . Adenocarcinoma of colon ([place]) 04/12/2011  . Morbid obesity ([place]) 02/10/2011  . HTN (hypertension) 02/10/2011     Mickle Asper, SPT 06/07/17 11:12 AM    [facility] [address]  [place], [place], [address] Phone: [phone]   Fax:  [phone]  Name: Jasmine Clayton MRN: [id number] Date of Birth: 12-01-1950

## 2017-06-08 ENCOUNTER — Telehealth: Payer: Self-pay | Admitting: *Deleted

## 2017-06-08 MED ORDER — HYDROCHLOROTHIAZIDE 25 MG PO TABS: ORAL_TABLET | ORAL | 1 refills | Status: DC

## 2017-06-08 NOTE — Telephone Encounter (Signed)
Received fax from [facility] requesting refill of HCTZ. Refills sent.

## 2017-06-09 ENCOUNTER — Ambulatory Visit: Payer: PPO | Admitting: Physical Therapy

## 2017-06-14 ENCOUNTER — Ambulatory Visit: Payer: PPO | Admitting: Physical Therapy

## 2017-06-14 ENCOUNTER — Encounter: Payer: Self-pay | Admitting: Physical Therapy

## 2017-06-14 DIAGNOSIS — M25561 Pain in right knee: Principal | ICD-10-CM

## 2017-06-14 DIAGNOSIS — R262 Difficulty in walking, not elsewhere classified: Secondary | ICD-10-CM

## 2017-06-14 DIAGNOSIS — R29898 Other symptoms and signs involving the musculoskeletal system: Secondary | ICD-10-CM

## 2017-06-14 DIAGNOSIS — G8929 Other chronic pain: Secondary | ICD-10-CM

## 2017-06-14 NOTE — Therapy (Signed)
[facility] [address]  [place], [place], [address] Phone: [phone]   Fax:  [phone]  Physical Therapy Treatment  Patient Details  Name: Jasmine Clayton MRN: [id number] Date of Birth: 02/14/1951 Referring Provider: Erskine Emery, PA-C   Encounter Date: 06/14/2017  PT End of Session - 06/14/17 1100    Visit Number  8    Number of Visits  12    Date for PT Re-Evaluation  06/21/17    Authorization Type  [employer]    PT Start Time  1014    PT Stop Time  1059    PT Time Calculation (min)  45 min    Activity Tolerance  Patient tolerated treatment well    Behavior During Therapy  [facility] for tasks assessed/performed       Past Medical History:  Diagnosis Date  . Adenocarcinoma of colon ([place]) 04/12/2011   "rectal-colon"  . Corneal ulcers and infections 07/04/12  . Degenerative joint disease    right knee  . DVT (deep vein thrombosis) in pregnancy ([place]) 1979   during pregnancy. lower extremity  . Family history of colon cancer   . Family history of uterine cancer   . History of chemotherapy     cycle 5 Folfox completed 09/23/11  . History of radiation therapy 10/25/11 -12/01/11   pelvis/presacral region  . Morbid obesity ([employer])   . Osteopenia   . 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 Stress incontinence, female    not current    Past Surgical History:  Procedure Laterality Date  . ABDOMINAL HYSTERECTOMY  3 years ago  . BREAST CYST EXCISION  1978  . CESAREAN SECTION    . COLON SURGERY     LAR, reoperation for necrotic ileostomy, transverse colostomy  . COLOSTOMY  05/27/11  . JOINT REPLACEMENT  2002   hip replacement  . [place]  . MASS EXCISION  2011   "mass removed from ovaries"  . PORTACATH PLACEMENT  07/26/11   right portacath  . TOTAL ABDOMINAL HYSTERECTOMY W/ BILATERAL SALPINGOOPHORECTOMY  11/30/09   BSO  . TOTAL  HIP ARTHROPLASTY  2002   left hip    There were no vitals filed for this visit.  Subjective Assessment - 06/14/17 1017    Subjective  Patient feeling well this morning, continues to be very busy with grandchildren, however reporting better compliance with HEP over the weekend.     Pertinent History  colon cancer, HTN, L THA    Currently in Pain?  Yes    Pain Score  3     Pain Location  Knee    Pain Orientation  Right    Pain Descriptors / Indicators  Throbbing    Pain Type  Chronic pain                      [facility] Adult PT Treatment/Exercise - 06/14/17 1020      Knee/Hip Exercises: Aerobic   Recumbent Bike  6 minutes      Knee/Hip Exercises: Standing   Lateral Step Up  Right;Left;10 reps;Hand Hold: 1;Step Height: 8"    Forward Step Up  Right;Left;15 reps;Hand Hold: 1;Step Height: 8"    Functional Squat  10 reps    Functional Squat Limitations  TRX    Other Standing Knee Exercises  monster walk; forward/backward; red tband; 2x 36ft each  Knee/Hip Exercises: Supine   Bridges  Both;15 reps    Bridges Limitations  hamstring curl with peanut ball    Single Leg Bridge  Right;Left;10 reps               PT Short Term Goals - 06/02/17 1021      PT SHORT TERM GOAL #1   Title  patient to be independent with initial HEP    Status  Achieved      PT SHORT TERM GOAL #2   Title  patient to demonstrate R knee AROM 0-115        PT Long Term Goals - 06/02/17 1021      PT LONG TERM GOAL #1   Title  patient to be independent with advanced HEP    Status  On-going      PT LONG TERM GOAL #2   Title  Patient to improve R LE strength to >/= 4+/5    Status  On-going      PT LONG TERM GOAL #3   Title  Patient to report ability to ambulate for >/= 30 min without pain limiting    Status  On-going      PT LONG TERM GOAL #4   Title  patient to report initiation and maintenance of walking program for exercise on most days of the week    Status  On-going             Plan - 06/14/17 1101    Clinical Impression Statement  Patient doing well with progression of LE strengthening today. Patient reported decreased pain in R knee after session today. Some cueing required during forward/lateral step ups and squats on TRX for patient to avoid valgus collapse of knees during movements. Will continue to progress as patient tolerates.     PT Treatment/Interventions  ADLs/Self Care Home Management;Cryotherapy;Electrical Stimulation;Iontophoresis 4mg /ml Dexamethasone;Moist Heat;Ultrasound;Neuromuscular re-education;Balance training;Therapeutic exercise;Therapeutic activities;Functional mobility training;Stair training;Gait training;Patient/family education;Manual techniques;Passive range of motion;Vasopneumatic Device;Taping;Dry needling       Patient will benefit from skilled therapeutic intervention in order to improve the following deficits and impairments:  Decreased activity tolerance, Decreased range of motion, Decreased mobility, Decreased strength, Difficulty walking, Pain  Visit Diagnosis: Chronic pain of right knee  Difficulty in walking, not elsewhere classified  Other symptoms and signs involving the musculoskeletal system     Problem List Patient Active Problem List   Diagnosis Date Noted  . 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 Osteopenia   . Depression 08/25/2016  . Insomnia 11/28/2015  . Family history of uterine cancer   . Vaginal dryness, menopausal 12/12/2014  . Hyperglycemia 01/07/2014  . Osteoarthritis, knee 02/16/2013  . Routine general medical examination at a health care facility 03/27/2012  . Adenocarcinoma of colon ([place]) 04/12/2011  . Morbid obesity ([place]) 02/10/2011  . HTN (hypertension) 02/10/2011     Mickle Asper, SPT 06/14/17 11:06 AM    [facility] [address]  [place], [place], [address] Phone:  [phone]   Fax:  [phone]  Name: Jasmine Clayton MRN: [id number] Date of Birth: 1951-07-28

## 2017-06-16 ENCOUNTER — Ambulatory Visit: Payer: PPO | Admitting: Physical Therapy

## 2017-06-16 ENCOUNTER — Encounter: Payer: Self-pay | Admitting: Physical Therapy

## 2017-06-16 DIAGNOSIS — M25561 Pain in right knee: Secondary | ICD-10-CM | POA: Diagnosis not present

## 2017-06-16 DIAGNOSIS — R29898 Other symptoms and signs involving the musculoskeletal system: Secondary | ICD-10-CM

## 2017-06-16 DIAGNOSIS — R262 Difficulty in walking, not elsewhere classified: Secondary | ICD-10-CM

## 2017-06-16 DIAGNOSIS — G8929 Other chronic pain: Secondary | ICD-10-CM

## 2017-06-16 NOTE — Patient Instructions (Addendum)
Wall Squat    Feet shoulder width apart. Heels on floor, knees parallel, bend hips and knees to almost 90. Lower slowly. Repeat _15___ times.    Forward Lunge    Standing with feet shoulder width apart and stomach tight, step forward with left leg. Repeat __10__ times each per set. Do __2__ sets per session.     Side Step Abduction With Exercise Band    Toes pointed forward, band around knees. Side step _20__ feet to each side. Do _2__ sets each direction per session.    EXTENSION: Sitting - Resistance Band (Active)    Sit with feet flat. Against red resistance band, straighten knee. Complete _2__ sets of _15__ repetitions each leg.     FLEXION: Sitting - Resistance Band (Active)    Sit with right leg extended. Against red resistance band, bend knee and draw foot backward. Complete _2__ sets of _15__ repetitions each leg.    Balance, Proprioception: Hip Extension With Tubing    With tubing attached to ankle, swing leg straight back, keep toes pointing forward. Return. Repeat _15___ times each leg.    Balance, Proprioception: Hip Abduction With Tubing    With tubing attached to ankle, swing leg to side keeping toes pointing forward. Return. Repeat _15___ times each leg.    Balance, Proprioception: Hip Flexion With Tubing    With tubing attached to ankle, swing leg forward keeping toes pointed forward. Return. Repeat __15__ times each leg.   Quadricep Stretch    Holding support, grasp right ankle. Gently pull foot toward buttocks until a stretch is felt on the upper front of leg. Hold __30__ seconds. Repeat _3___ times. Perform on both legs.    Supine: Leg Stretch With Strap (Basic)    Lie on back with one knee bent, foot flat on floor. Hook strap around other foot. Straighten knee. Keep knee level with other knee. Hold _30__ seconds. Relax leg completely down to floor.  Repeat _3__ times per session. Perform on both legs.

## 2017-06-16 NOTE — Therapy (Addendum)
[facility] [address]  [place], [place], [address] Phone: [phone]   Fax:  [phone]  Physical Therapy Treatment  Patient Details  Name: Jasmine Clayton MRN: [id number] Date of Birth: [birth year] Referring Provider: Erskine Emery, PA-C   Encounter Date: 06/16/2017  PT End of Session - 06/16/17 1111    Visit Number  9    Number of Visits  12    Date for PT Re-Evaluation  06/21/17    Authorization Type  [employer]    PT Start Time  1016    PT Stop Time  1103    PT Time Calculation (min)  47 min    Activity Tolerance  Patient tolerated treatment well    Behavior During Therapy  [facility] for tasks assessed/performed       Past Medical History:  Diagnosis Date  . Adenocarcinoma of colon ([place]) 04/12/2011   "rectal-colon"  . Corneal ulcers and infections 07/04/12  . Degenerative joint disease    right knee  . DVT (deep vein thrombosis) in pregnancy ([place]) 1979   during pregnancy. lower extremity  . Family history of colon cancer   . Family history of uterine cancer   . History of chemotherapy     cycle 5 Folfox completed 09/23/11  . History of radiation therapy 10/25/11 -12/01/11   pelvis/presacral region  . Morbid obesity ([place])   . Osteopenia   . 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 Stress incontinence, female    not current    Past Surgical History:  Procedure Laterality Date  . ABDOMINAL HYSTERECTOMY  3 years ago  . BREAST CYST EXCISION  1978  . CESAREAN SECTION    . COLON SURGERY     LAR, reoperation for necrotic ileostomy, transverse colostomy  . COLOSTOMY  05/27/11  . COLOSTOMY TAKEDOWN  07/11/2012   Procedure: COLOSTOMY TAKEDOWN;  Surgeon: Rolm Bookbinder, MD;  Location: [facility];  Service: General;  Laterality: N/A;  colostomy takedown  . JOINT REPLACEMENT  2002   hip replacement  . [place]  . MASS EXCISION  2011    "mass removed from ovaries"  . PORT-A-CATH REMOVAL  07/11/2012   Procedure: REMOVAL PORT-A-CATH;  Surgeon: Rolm Bookbinder, MD;  Location: [facility];  Service: General;  Laterality: N/A;  removal portacath  . PORTACATH PLACEMENT  07/26/11   right portacath  . TOTAL ABDOMINAL HYSTERECTOMY W/ BILATERAL SALPINGOOPHORECTOMY  11/30/09   BSO  . TOTAL HIP ARTHROPLASTY  2002   left hip    There were no vitals filed for this visit.  Subjective Assessment - 06/16/17 1018    Subjective  Patient feeling like she has progressed well; would like to discontinue PT at this time and attempt maintaining home program on her own. Pain is at a minimum and previoiusly bothersome activities are no longer causing pain.     Pertinent History  colon cancer, HTN, L THA    Currently in Pain?  Yes    Pain Score  2     Pain Location  Knee    Pain Orientation  Right    Pain Descriptors / Indicators  Throbbing    Pain Type  Chronic pain         [facility] PT Assessment - 06/16/17 1024      Observation/Other Assessments   Focus on Therapeutic Outcomes (FOTO)   62% (38% limited)  AROM   AROM Assessment Site  Knee    Right/Left Knee  Right    Right Knee Extension  0    Right Knee Flexion  112      Strength   Overall Strength Comments  R LE grossly 4+/5      Flexibility   Soft Tissue Assessment /Muscle Length  yes    Hamstrings  increased flexibility bilaterally                  [facility] Adult PT Treatment/Exercise - Jul 13, 2017 1019      Knee/Hip Exercises: Aerobic   Nustep  L6 x 6 min      Knee/Hip Exercises: Machines for Strengthening   Cybex Knee Extension  20#; B concentric, R/L eccentric; 15 reps each    Cybex Knee Flexion  20#; B concentric, R/L eccentric; 15 reps each    Cybex Leg Press  25#; 15 reps      Knee/Hip Exercises: Standing   Functional Squat  10 reps    Functional Squat Limitations  TRX    Lunge Walking - Round Trips  35f x 2; alt. R/L               PT Short  Term Goals - [id number]      PT SHORT TERM GOAL #1   Title  patient to be independent with initial HEP    Status  Achieved      PT SHORT TERM GOAL #2   Title  patient to demonstrate R knee AROM 0-115    Status  On-going        PT Long Term Goals - 12018/12/121029      PT LONG TERM GOAL #1   Title  patient to be independent with advanced HEP    Status  Partially Met    Target Date  06/21/17      PT LONG TERM GOAL #2   Title  Patient to improve R LE strength to >/= 4+/5    Status  Achieved      PT LONG TERM GOAL #3   Title  Patient to report ability to ambulate for >/= 30 min without pain limiting    Status  Partially Met      PT LONG TERM GOAL #4   Title  patient to report initiation and maintenance of walking program for exercise on most days of the week    Status  On-going            Plan - 12018/12/121114    Clinical Impression Statement  Reassessed goals and function today with patient meeting some goals and progressing well towards others. Patient has made notable improvements in strength and flexibility since beginning PT and feels confident in ability to carryover at home. PT and patient discussing 30 day hold for PT with both in agreement.     PT Treatment/Interventions  ADLs/Self Care Home Management;Cryotherapy;Electrical Stimulation;Iontophoresis 43mml Dexamethasone;Moist Heat;Ultrasound;Neuromuscular re-education;Balance training;Therapeutic exercise;Therapeutic activities;Functional mobility training;Stair training;Gait training;Patient/family education;Manual techniques;Passive range of motion;Vasopneumatic Device;Taping;Dry needling       Patient will benefit from skilled therapeutic intervention in order to improve the following deficits and impairments:  Decreased activity tolerance, Decreased range of motion, Decreased mobility, Decreased strength, Difficulty walking, Pain  Visit Diagnosis: Chronic pain of right knee  Difficulty in walking, not  elsewhere classified  Other symptoms and signs involving the musculoskeletal system   G-Codes - 11Dec 12, 2018131    Functional Assessment Tool Used (Outpatient Only)  FOTO: 62% (38% limited)    Functional Limitation  Mobility: Walking and moving around    Mobility: Walking and Moving Around Goal Status [phone])  At least 40 percent but less than 60 percent impaired, limited or restricted    Mobility: Walking and Moving Around Discharge Status [phone])  At least 20 percent but less than 40 percent impaired, limited or restricted       Problem List Patient Active Problem List   Diagnosis Date Noted  . 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 Osteopenia   . Depression 08/25/2016  . Insomnia 11/28/2015  . Family history of uterine cancer   . Vaginal dryness, menopausal 12/12/2014  . Hyperglycemia 01/07/2014  . Osteoarthritis, knee 02/16/2013  . Routine general medical examination at a health care facility 03/27/2012  . Adenocarcinoma of colon ([place]) 04/12/2011  . Morbid obesity ([place]) 02/10/2011  . HTN (hypertension) 02/10/2011     Mickle Asper, SPT 06/16/17 12:57 PM   PHYSICAL THERAPY DISCHARGE SUMMARY  Visits from Start of Care: 9  Current functional level related to goals / functional outcomes: See above   Remaining deficits: See above   Education / Equipment: HEP  Plan: Patient agrees to discharge.  Patient goals were met. Patient is being discharged due to meeting the stated rehab goals.  ?????    Lanney Gins, PT, DPT 08/01/17 8:29 AM   [facility] [address]  [place], [place], [address] Phone: [phone]   Fax:  [phone]  Name: Jasmine Clayton MRN: [id number] Date of Birth: 01-Mar-1951

## 2017-06-21 ENCOUNTER — Ambulatory Visit: Payer: PPO | Admitting: Physical Therapy

## 2017-06-29 ENCOUNTER — Other Ambulatory Visit (INDEPENDENT_AMBULATORY_CARE_PROVIDER_SITE_OTHER): Payer: Self-pay | Admitting: Physician Assistant

## 2017-07-04 ENCOUNTER — Ambulatory Visit (INDEPENDENT_AMBULATORY_CARE_PROVIDER_SITE_OTHER): Payer: PPO | Admitting: Psychology

## 2017-07-04 DIAGNOSIS — F331 Major depressive disorder, recurrent, moderate: Secondary | ICD-10-CM

## 2017-07-21 DIAGNOSIS — C189 Malignant neoplasm of colon, unspecified: Secondary | ICD-10-CM | POA: Diagnosis not present

## 2017-07-21 DIAGNOSIS — Z9049 Acquired absence of other specified parts of digestive tract: Secondary | ICD-10-CM | POA: Diagnosis not present

## 2017-07-21 DIAGNOSIS — Z85038 Personal history of other malignant neoplasm of large intestine: Secondary | ICD-10-CM | POA: Diagnosis not present

## 2017-07-21 LAB — HM COLONOSCOPY

## 2017-08-02 ENCOUNTER — Other Ambulatory Visit (INDEPENDENT_AMBULATORY_CARE_PROVIDER_SITE_OTHER): Payer: Self-pay | Admitting: Physician Assistant

## 2017-08-02 NOTE — Telephone Encounter (Signed)
Rx request 

## 2017-08-03 ENCOUNTER — Other Ambulatory Visit (INDEPENDENT_AMBULATORY_CARE_PROVIDER_SITE_OTHER): Payer: Self-pay | Admitting: Orthopaedic Surgery

## 2017-08-03 ENCOUNTER — Encounter: Payer: Self-pay | Admitting: *Deleted

## 2017-09-05 ENCOUNTER — Ambulatory Visit (INDEPENDENT_AMBULATORY_CARE_PROVIDER_SITE_OTHER): Payer: PPO | Admitting: Psychology

## 2017-09-05 DIAGNOSIS — F331 Major depressive disorder, recurrent, moderate: Secondary | ICD-10-CM

## 2017-09-06 ENCOUNTER — Other Ambulatory Visit: Payer: Self-pay | Admitting: Family

## 2017-09-06 ENCOUNTER — Telehealth: Payer: Self-pay | Admitting: Family

## 2017-09-06 ENCOUNTER — Encounter: Payer: Self-pay | Admitting: Family

## 2017-09-06 ENCOUNTER — Ambulatory Visit (INDEPENDENT_AMBULATORY_CARE_PROVIDER_SITE_OTHER): Payer: PPO | Admitting: Family

## 2017-09-06 VITALS — BP 150/77 | HR 67 | Temp 98.4°F | Resp 16 | Ht 61.0 in | Wt 239.0 lb

## 2017-09-06 DIAGNOSIS — F32A Depression, unspecified: Secondary | ICD-10-CM

## 2017-09-06 DIAGNOSIS — R739 Hyperglycemia, unspecified: Secondary | ICD-10-CM | POA: Diagnosis not present

## 2017-09-06 DIAGNOSIS — F329 Major depressive disorder, single episode, unspecified: Secondary | ICD-10-CM | POA: Diagnosis not present

## 2017-09-06 DIAGNOSIS — E876 Hypokalemia: Secondary | ICD-10-CM

## 2017-09-06 DIAGNOSIS — I10 Essential (primary) hypertension: Secondary | ICD-10-CM | POA: Diagnosis not present

## 2017-09-06 LAB — BASIC METABOLIC PANEL
BUN: 19 mg/dL (ref 6–23)
CALCIUM: 9.3 mg/dL (ref 8.4–10.5)
CO2: 30 meq/L (ref 19–32)
CREATININE: 1.06 mg/dL (ref 0.40–1.20)
Chloride: 100 mEq/L (ref 96–112)
GFR: 66.8 mL/min (ref >60.00)
GLUCOSE: 102 mg/dL — AB (ref 70–99)
Potassium: 3 mEq/L — ABNORMAL LOW (ref 3.5–5.1)
Sodium: 138 mEq/L (ref 135–145)

## 2017-09-06 MED ORDER — BUPROPION HCL 75 MG PO TABS: ORAL_TABLET | ORAL | 0 refills | Status: DC

## 2017-09-06 MED ORDER — POTASSIUM CHLORIDE CRYS ER 20 MEQ PO TBCR: 20.0000 meq | EXTENDED_RELEASE_TABLET | Freq: Every day | ORAL | 1 refills | Status: DC

## 2017-09-06 MED ORDER — ESCITALOPRAM OXALATE 20 MG PO TABS: 20.0000 mg | ORAL_TABLET | Freq: Every day | ORAL | 5 refills | Status: DC

## 2017-09-06 NOTE — Patient Instructions (Addendum)
Please begin wellbutrin, continue lexpro.  Complete lab work prior to leaving.

## 2017-09-06 NOTE — Telephone Encounter (Signed)
Please contact patient and let her know that her potassium level is low.I would like for her to take 2 tablets of K-Dur today then 1 tablet daily.  Repeat basic metabolic panel in 1 week diagnosis hypokalemia.

## 2017-09-06 NOTE — Telephone Encounter (Signed)
Dhyana, Bastone, RN    09/06/17 3:18 PM  Note    Pt given results per notes of Debbrah Alar on 09/06/17. Patient verbalized understanding. She says she will need a refill of K-Dur.

## 2017-09-06 NOTE — Telephone Encounter (Signed)
Pt given results per notes of Jasmine Clayton on 09/06/17. Patient verbalized understanding. She says she will need a refill of K-Dur.  Unable to document in result note due to result note not being routed to [facility].

## 2017-09-06 NOTE — Telephone Encounter (Signed)
Unable to leave message on home # and recording disconnected before I could leave a message. Left message on cell # to return my call. Future lab order entered. Please verify if pt needs refill of potassium when she calls back. Our records indicate refills have not been sent since 2017.

## 2017-09-06 NOTE — Telephone Encounter (Signed)
Thank you for notifying pt! I will send Rx. There wasn't a result note to document in.  Results were noted in the previous telephone encounter from earlier today.

## 2017-09-06 NOTE — Telephone Encounter (Signed)
Refill sent.

## 2017-09-06 NOTE — Progress Notes (Signed)
Subjective:    Patient ID: Jasmine Clayton, female    DOB: [birth year], 67 y.o.   MRN: [id number]  HPI  Jasmine Clayton is a 67 yr old female who presents today for routine follow up.  1) hypertension-she is maintained on hydrochlorothiazide 25 mg once daily.  She is also on potassium chloride daily.   BP Readings from Last 3 Encounters:  09/06/17 (!) 150/77  03/11/17 127/62  03/08/17 135/73   2) hyperglycemia- her last A1c was within normal limits. Lab Results  Component Value Date   HGBA1C 5.3 02/16/2017   3) Depression-she is currently maintained on Lexapro 20 mg once daily. Reports that she had a meltdown on Wednesday.  This week is the second year anniversary. Reports that her home is still in disarray.  She continues to work with Jasmine Clayton (counselor) whom she met with yesterday. She suggested that she schedule today's office visit.    Review of Systems    see HPI  Past Medical History:  Diagnosis Date  . Adenocarcinoma of colon ([place]) 04/12/2011   "rectal-colon"  . Corneal ulcers and infections 07/04/12  . Degenerative joint disease    right knee  . DVT (deep vein thrombosis) in pregnancy ([place]) 1979   during pregnancy. lower extremity  . Family history of colon cancer   . Family history of uterine cancer   . History of chemotherapy     cycle 5 Folfox completed 09/23/11  . History of radiation therapy 10/25/11 -12/01/11   pelvis/presacral region  . Morbid obesity ([place])   . Osteopenia   . 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 Stress incontinence, female    not current     Social History   Socioeconomic History  . Marital status: Married    Spouse name: Not on file  . Number of children: 1  . Years of education: Not on file  . Highest education level: Not on file  Social Needs  . Financial resource strain: Not on file  . Food insecurity - worry: Not on file  . Food insecurity - inability: Not  on file  . Transportation needs - medical: Not on file  . Transportation needs - non-medical: Not on file  Occupational History  . 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 Smoking status: Never Smoker  . Smokeless tobacco: Never Used  Substance and Sexual Activity  . Alcohol use: Yes    Alcohol/week: 0.0 oz    Comment: occasional 3 x a week (1 drink)  . Drug use: No  . Sexual activity: Not on file  Other Topics Concern  . Not on file  Social History Narrative   Regular exercise:  No   Caffeine Use:  1 cup coffee daily   Husband passed 2/17   Daughter, son-in-law and her 2 children- they recently moved out   Completed the 12th grade, and some college.   Does not work.    Past Surgical History:  Procedure Laterality Date  . ABDOMINAL HYSTERECTOMY  3 years ago  . BREAST CYST EXCISION  1978  . CESAREAN SECTION    . COLON SURGERY     LAR, reoperation for necrotic ileostomy, transverse colostomy  . COLOSTOMY  05/27/11  . COLOSTOMY TAKEDOWN  07/11/2012   Procedure: COLOSTOMY TAKEDOWN;  Surgeon: Rolm Bookbinder, MD;  Location: [facility];  Service: General;  Laterality: N/A;  colostomy takedown  .  JOINT REPLACEMENT  2002   hip replacement  . [place]  . MASS EXCISION  2011   "mass removed from ovaries"  . PORT-A-CATH REMOVAL  07/11/2012   Procedure: REMOVAL PORT-A-CATH;  Surgeon: Rolm Bookbinder, MD;  Location: [facility];  Service: General;  Laterality: N/A;  removal portacath  . PORTACATH PLACEMENT  07/26/11   right portacath  . TOTAL ABDOMINAL HYSTERECTOMY W/ BILATERAL SALPINGOOPHORECTOMY  11/30/09   BSO  . TOTAL HIP ARTHROPLASTY  2002   left hip    Family History  Problem Relation Age of Onset  . Uterine cancer Mother 34       deceased 89  . Diabetes Father   . Colon cancer Sister 108       currently 71  . Alcoholism Brother     No Known Allergies  Current Outpatient Medications on File Prior to Visit  Medication  Sig Dispense Refill  . aspirin 81 MG tablet Take 81 mg by mouth daily.      . B Complex Vitamins (B-COMPLEX/B-12 PO) Take 1 tablet by mouth daily.     . Calcium Carb-Cholecalciferol (CALCIUM-VITAMIN D3) 600-500 MG-UNIT CAPS Take 1 tablet by mouth 2 (two) times daily. 60 capsule 0  . escitalopram (LEXAPRO) 20 MG tablet Take 1 tablet (20 mg total) by mouth daily. 30 tablet 5  . hydrochlorothiazide (HYDRODIURIL) 25 MG tablet TAKE 1 TABLET(25 MG) BY MOUTH DAILY 90 tablet 1  . meloxicam (MOBIC) 7.5 MG tablet TAKE 1 TABLET(7.5 MG) BY MOUTH DAILY 90 tablet 0  . Omega-3 Fatty Acids (FISH OIL) 1200 MG CAPS Take 1 capsule by mouth daily.    . potassium chloride SA (K-DUR,KLOR-CON) 20 MEQ tablet TAKE 1 TABLET BY MOUTH EVERY DAY 90 tablet 1   Current Facility-Administered Medications on File Prior to Visit  Medication Dose Route Frequency Provider Last Rate Last Dose  . sodium chloride 0.9 % injection 10 mL  10 mL Intravenous PRN [name], MD   10 mL at 05/25/12 1153    BP (!) 150/77 (BP Location: Right Arm, Patient Position: Sitting, Cuff Size: Large)   Pulse 67   Temp 98.4 F (36.9 C) (Oral)   Resp 16   Ht _0  (1.549 m)   Wt 239 lb (108.4 kg)   LMP 11/30/2009   SpO2 100%   BMI 45.16 kg/m    Objective:   Physical Exam  Constitutional: She is oriented to person, place, and time. She appears well-developed and well-nourished.  Cardiovascular: Normal rate, regular rhythm and normal heart sounds.  No murmur heard. Pulmonary/Chest: Effort normal and breath sounds normal. No respiratory distress. She has no wheezes.  Neurological: She is alert and oriented to person, place, and time.  Psychiatric: Her behavior is normal. Judgment and thought content normal.  Flat affect          Assessment & Plan:  HTN- SBP acceptable for age, continue hctz. Obtain follow up bmet.  Depression- uncontrolled. Continue lexapro, add wellbutrin.   Hyperglycemia- A1C wnl,  Monitor.

## 2017-09-14 ENCOUNTER — Other Ambulatory Visit (INDEPENDENT_AMBULATORY_CARE_PROVIDER_SITE_OTHER): Payer: PPO

## 2017-09-14 DIAGNOSIS — E876 Hypokalemia: Secondary | ICD-10-CM

## 2017-09-14 LAB — BASIC METABOLIC PANEL
BUN: 11 mg/dL (ref 6–23)
CHLORIDE: 102 meq/L (ref 96–112)
CO2: 29 mEq/L (ref 19–32)
Calcium: 9.4 mg/dL (ref 8.4–10.5)
Creatinine, Ser: 1.08 mg/dL (ref 0.40–1.20)
GFR: 65.37 mL/min (ref >60.00)
Glucose, Bld: 93 mg/dL (ref 70–99)
POTASSIUM: 3.6 meq/L (ref 3.5–5.1)
SODIUM: 139 meq/L (ref 135–145)

## 2017-09-15 ENCOUNTER — Encounter: Payer: Self-pay | Admitting: Family

## 2017-09-26 NOTE — Progress Notes (Signed)
Mailed out to pt 

## 2017-10-03 ENCOUNTER — Ambulatory Visit (INDEPENDENT_AMBULATORY_CARE_PROVIDER_SITE_OTHER): Payer: PPO | Admitting: Psychology

## 2017-10-03 DIAGNOSIS — F331 Major depressive disorder, recurrent, moderate: Secondary | ICD-10-CM | POA: Diagnosis not present

## 2017-10-04 ENCOUNTER — Ambulatory Visit (INDEPENDENT_AMBULATORY_CARE_PROVIDER_SITE_OTHER): Payer: PPO | Admitting: Family

## 2017-10-04 ENCOUNTER — Encounter: Payer: Self-pay | Admitting: Family

## 2017-10-04 VITALS — BP 151/73 | HR 75 | Temp 98.1°F | Resp 16 | Ht 61.0 in | Wt 241.0 lb

## 2017-10-04 DIAGNOSIS — F32A Depression, unspecified: Secondary | ICD-10-CM

## 2017-10-04 DIAGNOSIS — F329 Major depressive disorder, single episode, unspecified: Secondary | ICD-10-CM

## 2017-10-04 MED ORDER — BUPROPION HCL 75 MG PO TABS: ORAL_TABLET | ORAL | 5 refills | Status: DC

## 2017-10-04 NOTE — Patient Instructions (Signed)
Continue current meds.   Continue your work with Coralyn Mark.

## 2017-10-04 NOTE — Progress Notes (Signed)
Subjective:    Patient ID: Jasmine Clayton, female    DOB: Feb 24, 1951, 67 y.o.   MRN: [id number]  HPI  Patient is a 67 yr old female who presents today for follow up of her depression.  Last visit her depression was uncontrolled on lexapro and we added wellbutrin. Reports that it helps some but she has had some insomnia 1-2 nights/week. She thinks that she was having insomnia even before she was on wellbutrin.  She met with her therapist this week.  She reports that she still hasn't tackled cleaning out her home which reportedly is in disarray.    Review of Systems   See HPI  Past Medical History:  Diagnosis Date  . Adenocarcinoma of colon ([place]) 04/12/2011   "rectal-colon"  . Corneal ulcers and infections 07/04/12  . Degenerative joint disease    right knee  . DVT (deep vein thrombosis) in pregnancy ([place]) 1979   during pregnancy. lower extremity  . Family history of colon cancer   . Family history of uterine cancer   . History of chemotherapy     cycle 5 Folfox completed 09/23/11  . History of radiation therapy 10/25/11 -12/01/11   pelvis/presacral region  . Morbid obesity ([place])   . Osteopenia   . 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 Stress incontinence, female    not current     Social History   Socioeconomic History  . Marital status: Married    Spouse name: Not on file  . Number of children: 1  . Years of education: Not on file  . Highest education level: Not on file  Social Needs  . Financial resource strain: Not on file  . Food insecurity - worry: Not on file  . Food insecurity - inability: Not on file  . Transportation needs - medical: Not on file  . Transportation needs - non-medical: Not on file  Occupational History  . 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 Smoking status: Never Smoker  . Smokeless tobacco: Never Used  Substance and Sexual Activity  .  Alcohol use: Yes    Alcohol/week: 0.0 oz    Comment: occasional 3 x a week (1 drink)  . Drug use: No  . Sexual activity: Not on file  Other Topics Concern  . Not on file  Social History Narrative   Regular exercise:  No   Caffeine Use:  1 cup coffee daily   Husband passed 2/17   Daughter, son-in-law and her 2 children- they recently moved out   Completed the 12th grade, and some college.   Does not work.    Past Surgical History:  Procedure Laterality Date  . ABDOMINAL HYSTERECTOMY  3 years ago  . BREAST CYST EXCISION  1978  . CESAREAN SECTION    . COLON SURGERY     LAR, reoperation for necrotic ileostomy, transverse colostomy  . COLOSTOMY  05/27/11  . COLOSTOMY TAKEDOWN  07/11/2012   Procedure: COLOSTOMY TAKEDOWN;  Surgeon: Rolm Bookbinder, MD;  Location: [facility];  Service: General;  Laterality: N/A;  colostomy takedown  . JOINT REPLACEMENT  2002   hip replacement  . [place]  . MASS EXCISION  2011   "mass removed from ovaries"  . PORT-A-CATH REMOVAL  07/11/2012   Procedure: REMOVAL PORT-A-CATH;  Surgeon: Rolm Bookbinder, MD;  Location: [facility];  Service: General;  Laterality: N/A;  removal portacath  . PORTACATH PLACEMENT  07/26/11   right portacath  . TOTAL ABDOMINAL HYSTERECTOMY W/ BILATERAL SALPINGOOPHORECTOMY  11/30/09   BSO  . TOTAL HIP ARTHROPLASTY  2002   left hip    Family History  Problem Relation Age of Onset  . Uterine cancer Mother 37       deceased 38  . Diabetes Father   . Colon cancer Sister 10       currently 19  . Alcoholism Brother     No Known Allergies  Current Outpatient Medications on File Prior to Visit  Medication Sig Dispense Refill  . aspirin 81 MG tablet Take 81 mg by mouth daily.      . B Complex Vitamins (B-COMPLEX/B-12 PO) Take 1 tablet by mouth daily.     Marland Kitchen buPROPion (WELLBUTRIN) 75 MG tablet 1 tab by mouth once daily for 3 days then increase to 1 tab twice daily 60 tablet 0  . Calcium Carb-Cholecalciferol  (CALCIUM-VITAMIN D3) 600-500 MG-UNIT CAPS Take 1 tablet by mouth 2 (two) times daily. 60 capsule 0  . escitalopram (LEXAPRO) 20 MG tablet Take 1 tablet (20 mg total) by mouth daily. 30 tablet 5  . hydrochlorothiazide (HYDRODIURIL) 25 MG tablet TAKE 1 TABLET(25 MG) BY MOUTH DAILY 90 tablet 1  . meloxicam (MOBIC) 7.5 MG tablet TAKE 1 TABLET(7.5 MG) BY MOUTH DAILY 90 tablet 0  . Omega-3 Fatty Acids (FISH OIL) 1200 MG CAPS Take 1 capsule by mouth daily.    . potassium chloride SA (K-DUR,KLOR-CON) 20 MEQ tablet Take 1 tablet (20 mEq total) by mouth daily. 90 tablet 1   Current Facility-Administered Medications on File Prior to Visit  Medication Dose Route Frequency Provider Last Rate Last Dose  . sodium chloride 0.9 % injection 10 mL  10 mL Intravenous PRN [name], MD   10 mL at 05/25/12 1153    BP (!) 151/73 (BP Location: Right Arm, Patient Position: Sitting, Cuff Size: Large)   Pulse 75   Temp 98.1 F (36.7 C) (Oral)   Resp 16   Ht '5\' 1"'  (1.549 m)   Wt 241 lb (109.3 kg)   LMP 11/30/2009   SpO2 96%   BMI 45.54 kg/m        Objective:   Physical Exam  Constitutional: She is oriented to person, place, and time. She appears well-developed and well-nourished. No distress.  Neurological: She is alert and oriented to person, place, and time.  Psychiatric: Her behavior is normal. Judgment and thought content normal.  Brighter affect          Assessment & Plan:  Depression- appears improved today.  Will continue current meds as well as her work with her counselor.  I encouraged her to begin working on organizing her home as I believe that this will really help how she feels overall.

## 2017-10-22 ENCOUNTER — Other Ambulatory Visit (INDEPENDENT_AMBULATORY_CARE_PROVIDER_SITE_OTHER): Payer: Self-pay | Admitting: Orthopaedic Surgery

## 2017-10-31 ENCOUNTER — Ambulatory Visit (INDEPENDENT_AMBULATORY_CARE_PROVIDER_SITE_OTHER): Payer: PPO | Admitting: Psychology

## 2017-10-31 DIAGNOSIS — F331 Major depressive disorder, recurrent, moderate: Secondary | ICD-10-CM | POA: Diagnosis not present

## 2017-11-12 ENCOUNTER — Other Ambulatory Visit (INDEPENDENT_AMBULATORY_CARE_PROVIDER_SITE_OTHER): Payer: Self-pay | Admitting: Orthopaedic Surgery

## 2017-11-12 ENCOUNTER — Other Ambulatory Visit: Payer: Self-pay | Admitting: Family

## 2017-12-15 DIAGNOSIS — H5203 Hypermetropia, bilateral: Secondary | ICD-10-CM | POA: Diagnosis not present

## 2017-12-15 DIAGNOSIS — H43393 Other vitreous opacities, bilateral: Secondary | ICD-10-CM | POA: Diagnosis not present

## 2017-12-15 DIAGNOSIS — H524 Presbyopia: Secondary | ICD-10-CM | POA: Diagnosis not present

## 2017-12-15 DIAGNOSIS — H0288A Meibomian gland dysfunction right eye, upper and lower eyelids: Secondary | ICD-10-CM | POA: Diagnosis not present

## 2017-12-15 DIAGNOSIS — H0288B Meibomian gland dysfunction left eye, upper and lower eyelids: Secondary | ICD-10-CM | POA: Diagnosis not present

## 2017-12-15 DIAGNOSIS — Z79899 Other long term (current) drug therapy: Secondary | ICD-10-CM | POA: Diagnosis not present

## 2017-12-15 DIAGNOSIS — H25813 Combined forms of age-related cataract, bilateral: Secondary | ICD-10-CM | POA: Diagnosis not present

## 2017-12-15 DIAGNOSIS — H52203 Unspecified astigmatism, bilateral: Secondary | ICD-10-CM | POA: Diagnosis not present

## 2017-12-30 ENCOUNTER — Ambulatory Visit (INDEPENDENT_AMBULATORY_CARE_PROVIDER_SITE_OTHER): Payer: PPO | Admitting: Psychology

## 2017-12-30 DIAGNOSIS — F331 Major depressive disorder, recurrent, moderate: Secondary | ICD-10-CM

## 2018-01-09 ENCOUNTER — Encounter: Payer: Self-pay | Admitting: Family

## 2018-01-09 ENCOUNTER — Ambulatory Visit (INDEPENDENT_AMBULATORY_CARE_PROVIDER_SITE_OTHER): Payer: PPO | Admitting: Family

## 2018-01-09 VITALS — BP 114/76 | HR 73 | Temp 98.0°F | Resp 18 | Ht 61.0 in | Wt 237.8 lb

## 2018-01-09 DIAGNOSIS — F329 Major depressive disorder, single episode, unspecified: Secondary | ICD-10-CM | POA: Diagnosis not present

## 2018-01-09 DIAGNOSIS — F32A Depression, unspecified: Secondary | ICD-10-CM

## 2018-01-09 DIAGNOSIS — Z23 Encounter for immunization: Secondary | ICD-10-CM

## 2018-01-09 DIAGNOSIS — I10 Essential (primary) hypertension: Secondary | ICD-10-CM | POA: Diagnosis not present

## 2018-01-09 NOTE — Progress Notes (Signed)
Subjective:    Patient ID: Jasmine Clayton, female    DOB: Dec 09, 1950, 67 y.o.   MRN: [id number]  HPI  Ms. Favor is a 67 yr old female who presents today for follow up of her depression. She is working with Clint Bolder for counseling. She is following about 1-2 times a month.  She continues wellbutrin and lexapro. Doesn't sleep well but this was the case even before starting wellbutrin. Has not attempted to tackle her house clean out. Feels overwhelmed in caring for her grandchildren.   HTN- maintained on hctz BP Readings from Last 3 Encounters:  01/09/18 114/76  10/04/17 (!) 151/73  09/06/17 (!) 150/77     Review of Systems Past Medical History:  Diagnosis Date  . Adenocarcinoma of colon ([place]) 04/12/2011   "rectal-colon"  . Corneal ulcers and infections 07/04/12  . Degenerative joint disease    right knee  . DVT (deep vein thrombosis) in pregnancy ([place]) 1979   during pregnancy. lower extremity  . Family history of colon cancer   . Family history of uterine cancer   . History of chemotherapy     cycle 5 Folfox completed 09/23/11  . History of radiation therapy 10/25/11 -12/01/11   pelvis/presacral region  . Morbid obesity ([place])   . Osteopenia   . 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 Stress incontinence, female    not current     Social History   Socioeconomic History  . Marital status: Married    Spouse name: Not on file  . Number of children: 1  . Years of education: Not on file  . Highest education level: Not on file  Occupational History  . 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 Financial resource strain: Not on file  . Food insecurity:    Worry: Not on file    Inability: Not on file  . Transportation needs:    Medical: Not on file    Non-medical: Not on file  Tobacco Use  . Smoking status: Never Smoker  . Smokeless tobacco: Never Used  Substance  and Sexual Activity  . Alcohol use: Yes    Alcohol/week: 0.0 oz    Comment: occasional 3 x a week (1 drink)  . Drug use: No  . Sexual activity: Not on file  Lifestyle  . Physical activity:    Days per week: Not on file    Minutes per session: Not on file  . Stress: Not on file  Relationships  . Social connections:    Talks on phone: Not on file    Gets together: Not on file    Attends religious service: Not on file    Active member of club or organization: Not on file    Attends meetings of clubs or organizations: Not on file    Relationship status: Not on file  . Intimate partner violence:    Fear of current or ex partner: Not on file    Emotionally abused: Not on file    Physically abused: Not on file    Forced sexual activity: Not on file  Other Topics Concern  . Not on file  Social History Narrative   Regular exercise:  No   Caffeine Use:  1 cup coffee daily   Husband passed 2/17   Daughter, son-in-law and her 2 children- they recently moved out   Completed the 12th grade, and  some college.   Does not work.    Past Surgical History:  Procedure Laterality Date  . ABDOMINAL HYSTERECTOMY  3 years ago  . BREAST CYST EXCISION  1978  . CESAREAN SECTION    . COLON SURGERY     LAR, reoperation for necrotic ileostomy, transverse colostomy  . COLOSTOMY  05/27/11  . COLOSTOMY TAKEDOWN  07/11/2012   Procedure: COLOSTOMY TAKEDOWN;  Surgeon: Rolm Bookbinder, MD;  [facility];  Service: General;  Laterality: N/A;  colostomy takedown  . JOINT REPLACEMENT  2002   hip replacement  . [place]  . MASS EXCISION  2011   "mass removed from ovaries"  . PORT-A-CATH REMOVAL  07/11/2012   Procedure: REMOVAL PORT-A-CATH;  Surgeon: Rolm Bookbinder, MD;  Location: [facility];  Service: General;  Laterality: N/A;  removal portacath  . PORTACATH PLACEMENT  07/26/11   right portacath  . TOTAL ABDOMINAL HYSTERECTOMY W/ BILATERAL SALPINGOOPHORECTOMY  11/30/09   BSO  . TOTAL  HIP ARTHROPLASTY  2002   left hip    Family History  Problem Relation Age of Onset  . Uterine cancer Mother 20       deceased 48  . Diabetes Father   . Colon cancer Sister 62       currently 28  . Alcoholism Brother     No Known Allergies  Current Outpatient Medications on File Prior to Visit  Medication Sig Dispense Refill  . aspirin 81 MG tablet Take 81 mg by mouth daily.      . B Complex Vitamins (B-COMPLEX/B-12 PO) Take 1 tablet by mouth daily.     Marland Kitchen buPROPion (WELLBUTRIN) 75 MG tablet 1 tab by mouth bid 60 tablet 5  . Calcium Carb-Cholecalciferol (CALCIUM-VITAMIN D3) 600-500 MG-UNIT CAPS Take 1 tablet by mouth 2 (two) times daily. 60 capsule 0  . escitalopram (LEXAPRO) 20 MG tablet Take 1 tablet (20 mg total) by mouth daily. 30 tablet 5  . hydrochlorothiazide (HYDRODIURIL) 25 MG tablet TAKE 1 TABLET(25 MG) BY MOUTH DAILY 90 tablet 0  . meloxicam (MOBIC) 7.5 MG tablet TAKE 1 TABLET(7.5 MG) BY MOUTH DAILY 90 tablet 0  . Omega-3 Fatty Acids (FISH OIL) 1200 MG CAPS Take 1 capsule by mouth daily.    . potassium chloride SA (K-DUR,KLOR-CON) 20 MEQ tablet Take 1 tablet (20 mEq total) by mouth daily. 90 tablet 1   Current Facility-Administered Medications on File Prior to Visit  Medication Dose Route Frequency Provider Last Rate Last Dose  . sodium chloride 0.9 % injection 10 mL  10 mL Intravenous PRN [name], MD   10 mL at 05/25/12 1153    BP 114/76 (BP Location: Right Arm, Cuff Size: Large)   Pulse 73   Temp 98 F (36.7 C) (Oral)   Resp 18   Ht 5\' 1"  (1.549 m)   Wt 237 lb 12.8 oz (107.9 kg)   LMP 11/30/2009   SpO2 100%   BMI 44.93 kg/m       Objective:   Physical Exam  Constitutional: She appears well-developed and well-nourished.  Cardiovascular: Normal rate, regular rhythm and normal heart sounds.  No murmur heard. Pulmonary/Chest: Effort normal and breath sounds normal. No respiratory distress. She has no wheezes.  Psychiatric: She has a normal mood and  affect. Her behavior is normal. Judgment and thought content normal.          Assessment & Plan:  HTN-  Controlled, continue hctz.  Depression-uncontrolled. I recommended referral to psychiatry.  Pt is agreeable and I have given her contact info to schedule an appointment. Continue current meds for now, further med management per psychiatry. Encouraged pt to continue counseling as well and to consider employing her family as well to help her organize/clean out her home.

## 2018-01-09 NOTE — Patient Instructions (Signed)
Please contact psychiatry to arrange an appointment.

## 2018-02-10 ENCOUNTER — Other Ambulatory Visit: Payer: Self-pay | Admitting: Family

## 2018-04-06 ENCOUNTER — Ambulatory Visit (INDEPENDENT_AMBULATORY_CARE_PROVIDER_SITE_OTHER): Payer: PPO | Admitting: Psychology

## 2018-04-06 DIAGNOSIS — F331 Major depressive disorder, recurrent, moderate: Secondary | ICD-10-CM

## 2018-04-11 ENCOUNTER — Ambulatory Visit (INDEPENDENT_AMBULATORY_CARE_PROVIDER_SITE_OTHER): Payer: PPO | Admitting: Family

## 2018-04-11 ENCOUNTER — Encounter: Payer: Self-pay | Admitting: Family

## 2018-04-11 VITALS — BP 94/74 | HR 64 | Temp 98.5°F | Resp 16 | Ht 61.0 in | Wt 234.0 lb

## 2018-04-11 DIAGNOSIS — F329 Major depressive disorder, single episode, unspecified: Secondary | ICD-10-CM

## 2018-04-11 DIAGNOSIS — I10 Essential (primary) hypertension: Secondary | ICD-10-CM

## 2018-04-11 DIAGNOSIS — Z23 Encounter for immunization: Secondary | ICD-10-CM

## 2018-04-11 MED ORDER — BUPROPION HCL ER (XL) 300 MG PO TB24: 300.0000 mg | ORAL_TABLET | Freq: Every day | ORAL | 2 refills | Status: DC

## 2018-04-11 NOTE — Progress Notes (Signed)
Subjective:    Patient ID: Jasmine Clayton, female    DOB: 1951-03-23, 67 y.o.   MRN: [id number]  HPI  Patient is a 67 year old female who presents today for follow-up.  Hypertension- she is maintained on hydrochlorothiazide.  BP Readings from Last 3 Encounters:  04/11/18 94/74  01/09/18 114/76  10/04/17 (!) 151/73   Depression- last visit she was noted to have uncontrolled depression symptoms despite continuation of Lexapro and Wellbutrin.  We recommended that she establish with psychiatry.  She was also encouraged to continue her work with her counselor.  Reports that she could not afford the up front cost to schedule with psychiatry. She reports that she needs to focus and get the energy to do her house.  Reports that her mood fluctuates.  Seems to worsen when she is with her grandchildren.  "the get on my nerve at times."  Feels like she is no longer "steady crying."  Denies anxiety symptoms.  Sleeps OK.     Review of Systems See HPI  Past Medical History:  Diagnosis Date  . Adenocarcinoma of colon ([place]) 04/12/2011   "rectal-colon"  . Corneal ulcers and infections 07/04/12  . Degenerative joint disease    right knee  . DVT (deep vein thrombosis) in pregnancy ([place]) 1979   during pregnancy. lower extremity  . Family history of colon cancer   . Family history of uterine cancer   . History of chemotherapy     cycle 5 Folfox completed 09/23/11  . History of radiation therapy 10/25/11 -12/01/11   pelvis/presacral region  . Morbid obesity ([place])   . Osteopenia   . 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 Stress incontinence, female    not current     Social History   Socioeconomic History  . Marital status: Married    Spouse name: Not on file  . Number of children: 1  . Years of education: Not on file  . Highest education level: Not on file  Occupational History  . 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 Financial resource strain: Not on file  . Food insecurity:    Worry: Not on file    Inability: Not on file  . Transportation needs:    Medical: Not on file    Non-medical: Not on file  Tobacco Use  . Smoking status: Never Smoker  . Smokeless tobacco: Never Used  Substance and Sexual Activity  . Alcohol use: Yes    Alcohol/week: 0.0 standard drinks    Comment: occasional 3 x a week (1 drink)  . Drug use: No  . Sexual activity: Not on file  Lifestyle  . Physical activity:    Days per week: Not on file    Minutes per session: Not on file  . Stress: Not on file  Relationships  . Social connections:    Talks on phone: Not on file    Gets together: Not on file    Attends religious service: Not on file    Active member of club or organization: Not on file    Attends meetings of clubs or organizations: Not on file    Relationship status: Not on file  . Intimate partner violence:    Fear of current or ex partner: Not on file    Emotionally abused: Not on file    Physically abused: Not on file    Forced  sexual activity: Not on file  Other Topics Concern  . Not on file  Social History Narrative   Regular exercise:  No   Caffeine Use:  1 cup coffee daily   Husband passed 2/17   Daughter, son-in-law and her 2 children- they recently moved out   Completed the 12th grade, and some college.   Does not work.    Past Surgical History:  Procedure Laterality Date  . ABDOMINAL HYSTERECTOMY  3 years ago  . BREAST CYST EXCISION  1978  . CESAREAN SECTION    . COLON SURGERY     LAR, reoperation for necrotic ileostomy, transverse colostomy  . COLOSTOMY  05/27/11  . COLOSTOMY TAKEDOWN  07/11/2012   Procedure: COLOSTOMY TAKEDOWN;  Surgeon: Rolm Bookbinder, MD;  Location: [facility];  Service: General;  Laterality: N/A;  colostomy takedown  . JOINT REPLACEMENT  2002   hip replacement  . [place]  . MASS EXCISION  2011   "mass removed  from ovaries"  . PORT-A-CATH REMOVAL  07/11/2012   Procedure: REMOVAL PORT-A-CATH;  Surgeon: Rolm Bookbinder, MD;  Location: [facility];  Service: General;  Laterality: N/A;  removal portacath  . PORTACATH PLACEMENT  07/26/11   right portacath  . TOTAL ABDOMINAL HYSTERECTOMY W/ BILATERAL SALPINGOOPHORECTOMY  11/30/09   BSO  . TOTAL HIP ARTHROPLASTY  2002   left hip    Family History  Problem Relation Age of Onset  . Uterine cancer Mother 10       deceased 38  . Diabetes Father   . Colon cancer Sister 105       currently 67  . Alcoholism Brother     No Known Allergies  Current Outpatient Medications on File Prior to Visit  Medication Sig Dispense Refill  . aspirin 81 MG tablet Take 81 mg by mouth daily.      . B Complex Vitamins (B-COMPLEX/B-12 PO) Take 1 tablet by mouth daily.     Marland Kitchen buPROPion (WELLBUTRIN) 75 MG tablet 1 tab by mouth bid 60 tablet 5  . Calcium Carb-Cholecalciferol (CALCIUM-VITAMIN D3) 600-500 MG-UNIT CAPS Take 1 tablet by mouth 2 (two) times daily. 60 capsule 0  . escitalopram (LEXAPRO) 20 MG tablet Take 1 tablet (20 mg total) by mouth daily. 30 tablet 5  . hydrochlorothiazide (HYDRODIURIL) 25 MG tablet TAKE 1 TABLET(25 MG) BY MOUTH DAILY 90 tablet 0  . meloxicam (MOBIC) 7.5 MG tablet TAKE 1 TABLET(7.5 MG) BY MOUTH DAILY 90 tablet 0  . Omega-3 Fatty Acids (FISH OIL) 1200 MG CAPS Take 1 capsule by mouth daily.    . potassium chloride SA (K-DUR,KLOR-CON) 20 MEQ tablet Take 1 tablet (20 mEq total) by mouth daily. 90 tablet 1   Current Facility-Administered Medications on File Prior to Visit  Medication Dose Route Frequency Provider Last Rate Last Dose  . sodium chloride 0.9 % injection 10 mL  10 mL Intravenous PRN [name], MD   10 mL at 05/25/12 1153    BP 94/74 (BP Location: Left Arm, Patient Position: Sitting, Cuff Size: Large)   Pulse 64   Temp 98.5 F (36.9 C) (Oral)   Resp 16   Ht 5\' 1"  (1.549 m)   Wt 234 lb (106.1 kg)   LMP 11/30/2009   SpO2 100%    BMI 44.21 kg/m       Objective:   Physical Exam  Constitutional: She is oriented to person, place, and time. She appears well-developed and well-nourished.  Cardiovascular: Normal rate, regular  rhythm and normal heart sounds.  No murmur heard. Pulmonary/Chest: Effort normal and breath sounds normal. No respiratory distress. She has no wheezes.  Neurological: She is alert and oriented to person, place, and time.  Skin: Skin is warm and dry.  Psychiatric: She has a normal mood and affect. Her behavior is normal. Judgment and thought content normal.          Assessment & Plan:  HTN-blood pressure appears overtreated.  Will discontinue hydrochlorothiazide as well as K. Dur.  I have advised her to contact me should she develop lower extremity edema off of the diuretic.  Plan follow-up in 2 weeks for repeat blood pressure and follow-up basic metabolic panel.  Depression-this has improved some, but she continues to struggle with her motivation-particularly with things pertaining to her home.  She is currently taking Wellbutrin 75 mg by mouth twice daily.  Will change to 300 mg extended release once daily.  She is encouraged to continue to work with her counselor.  Flu shot today.

## 2018-04-11 NOTE — Patient Instructions (Addendum)
Stop hctz, stop Kdur.   Change wellbutrin to 300mg  xl once daily.

## 2018-04-25 ENCOUNTER — Ambulatory Visit (INDEPENDENT_AMBULATORY_CARE_PROVIDER_SITE_OTHER): Payer: PPO | Admitting: Family

## 2018-04-25 VITALS — BP 138/82 | HR 64 | Temp 98.5°F | Resp 16 | Ht 61.0 in | Wt 232.6 lb

## 2018-04-25 DIAGNOSIS — R400 Somnolence: Secondary | ICD-10-CM

## 2018-04-25 DIAGNOSIS — F329 Major depressive disorder, single episode, unspecified: Secondary | ICD-10-CM

## 2018-04-25 DIAGNOSIS — I10 Essential (primary) hypertension: Secondary | ICD-10-CM

## 2018-04-25 DIAGNOSIS — F32A Depression, unspecified: Secondary | ICD-10-CM

## 2018-04-25 LAB — BASIC METABOLIC PANEL
BUN: 11 mg/dL (ref 6–23)
CHLORIDE: 103 meq/L (ref 96–112)
CO2: 31 mEq/L (ref 19–32)
CREATININE: 1.01 mg/dL (ref 0.40–1.20)
Calcium: 9.5 mg/dL (ref 8.4–10.5)
GFR: 70.49 mL/min (ref >60.00)
Glucose, Bld: 86 mg/dL (ref 70–99)
POTASSIUM: 3.7 meq/L (ref 3.5–5.1)
Sodium: 141 mEq/L (ref 135–145)

## 2018-04-25 NOTE — Progress Notes (Signed)
Subjective:    Patient ID: Jasmine Clayton, female    DOB: 06/16/1951, 67 y.o.   MRN: [id number]  HPI  Patient is a 67 yr old female who presents today for follow up.  Depression- last visit depression symptoms were uncontrolled we increased her wellbutrin and continued lexapro. Last visit we increased wellbutrin.  Reports some improvement in her mood with this adjustment.  Reports that she feels "a little bit more relaxed and calm."  HTN- last visit BP was noted to be over treated.  We discontinued hctz and Kdur.  Reports only one episode of swelling after prolonged standing but otherwise no swelling.  BP Readings from Last 3 Encounters:  04/25/18 138/82  04/11/18 94/74  01/09/18 114/76   Reports + daytime somnolence despite adequate sleep.  Reports that she dozes off by accident when watching television. Grandchildren tell her she snores.    Review of Systems    see HPI  Past Medical History:  Diagnosis Date  . Adenocarcinoma of colon ([place]) 04/12/2011   "rectal-colon"  . Corneal ulcers and infections 07/04/12  . Degenerative joint disease    right knee  . DVT (deep vein thrombosis) in pregnancy ([place]) 1979   during pregnancy. lower extremity  . Family history of colon cancer   . Family history of uterine cancer   . History of chemotherapy     cycle 5 Folfox completed 09/23/11  . History of radiation therapy 10/25/11 -12/01/11   pelvis/presacral region  . Morbid obesity ([place])   . Osteopenia   . 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 Stress incontinence, female    not current     Social History   Socioeconomic History  . Marital status: Married    Spouse name: Not on file  . Number of children: 1  . Years of education: Not on file  . Highest education level: Not on file  Occupational History  . 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 Financial resource strain:  Not on file  . Food insecurity:    Worry: Not on file    Inability: Not on file  . Transportation needs:    Medical: Not on file    Non-medical: Not on file  Tobacco Use  . Smoking status: Never Smoker  . Smokeless tobacco: Never Used  Substance and Sexual Activity  . Alcohol use: Yes    Alcohol/week: 0.0 standard drinks    Comment: occasional 3 x a week (1 drink)  . Drug use: No  . Sexual activity: Not on file  Lifestyle  . Physical activity:    Days per week: Not on file    Minutes per session: Not on file  . Stress: Not on file  Relationships  . Social connections:    Talks on phone: Not on file    Gets together: Not on file    Attends religious service: Not on file    Active member of club or organization: Not on file    Attends meetings of clubs or organizations: Not on file    Relationship status: Not on file  . Intimate partner violence:    Fear of current or ex partner: Not on file    Emotionally abused: Not on file    Physically abused: Not on file    Forced sexual activity: Not on file  Other Topics Concern  . Not on file  Social History Narrative   Regular exercise:  No   Caffeine Use:  1 cup coffee daily   Husband passed 2/17   Daughter, son-in-law and her 2 children- they recently moved out   Completed the 12th grade, and some college.   Does not work.     Past Surgical History:  Procedure Laterality Date  . ABDOMINAL HYSTERECTOMY  3 years ago  . BREAST CYST EXCISION  1978  . CESAREAN SECTION    . COLON SURGERY     LAR, reoperation for necrotic ileostomy, transverse colostomy  . COLOSTOMY  05/27/11  . COLOSTOMY TAKEDOWN  07/11/2012   Procedure: COLOSTOMY TAKEDOWN;  Surgeon: Rolm Bookbinder, MD;  Location: [facility];  Service: General;  Laterality: N/A;  colostomy takedown  . JOINT REPLACEMENT  2002   hip replacement  . [place]  . MASS EXCISION  2011   "mass removed from ovaries"  . PORT-A-CATH REMOVAL  07/11/2012   Procedure:  REMOVAL PORT-A-CATH;  Surgeon: Rolm Bookbinder, MD;  Location: [facility];  Service: General;  Laterality: N/A;  removal portacath  . PORTACATH PLACEMENT  07/26/11   right portacath  . TOTAL ABDOMINAL HYSTERECTOMY W/ BILATERAL SALPINGOOPHORECTOMY  11/30/09   BSO  . TOTAL HIP ARTHROPLASTY  2002   left hip    Family History  Problem Relation Age of Onset  . Uterine cancer Mother 39       deceased 40  . Diabetes Father   . Colon cancer Sister 22       currently 40  . Alcoholism Brother     No Known Allergies  Current Outpatient Medications on File Prior to Visit  Medication Sig Dispense Refill  . aspirin 81 MG tablet Take 81 mg by mouth daily.      . B Complex Vitamins (B-COMPLEX/B-12 PO) Take 1 tablet by mouth daily.     Marland Kitchen buPROPion (WELLBUTRIN XL) 300 MG 24 hr tablet Take 1 tablet (300 mg total) by mouth daily. 30 tablet 2  . Calcium Carb-Cholecalciferol (CALCIUM-VITAMIN D3) 600-500 MG-UNIT CAPS Take 1 tablet by mouth 2 (two) times daily. 60 capsule 0  . escitalopram (LEXAPRO) 20 MG tablet Take 1 tablet (20 mg total) by mouth daily. 30 tablet 5  . meloxicam (MOBIC) 7.5 MG tablet TAKE 1 TABLET(7.5 MG) BY MOUTH DAILY 90 tablet 0  . Omega-3 Fatty Acids (FISH OIL) 1200 MG CAPS Take 1 capsule by mouth daily.     Current Facility-Administered Medications on File Prior to Visit  Medication Dose Route Frequency Provider Last Rate Last Dose  . sodium chloride 0.9 % injection 10 mL  10 mL Intravenous PRN [name], MD   10 mL at 05/25/12 1153    BP 138/82 (BP Location: Right Arm, Patient Position: Sitting, Cuff Size: Large)   Pulse 64   Temp 98.5 F (36.9 C) (Oral)   Resp 16   Ht 5\' 1"  (1.549 m)   Wt 232 lb 9.6 oz (105.5 kg)   LMP 11/30/2009   SpO2 97%   BMI 43.95 kg/m    Objective:   Physical Exam  Constitutional: She appears well-developed and well-nourished.  Cardiovascular: Normal rate, regular rhythm and normal heart sounds.  No murmur heard. Pulmonary/Chest: Effort  normal and breath sounds normal. No respiratory distress. She has no wheezes.  Psychiatric: She has a normal mood and affect. Her behavior is normal. Judgment and thought content normal.          Assessment & Plan:  Depression- some improvement with increase in wellbutrin. Advised pt to continue current doses/meds and arrange follow up with her therapist.  HTN- stable/improved off of hctz. Check follow up bmet to assess K+.remain off of hctz.   Daytime somnolence- will order sleep study. She prefers to do in the sleep lab.

## 2018-04-25 NOTE — Patient Instructions (Addendum)
Please complete lab work prior to leaving. You should be contacted about scheduling your sleep study.  Please schedule a follow up appointment with Jasmine Clayton.

## 2018-04-27 ENCOUNTER — Encounter: Payer: Self-pay | Admitting: Family

## 2018-05-11 ENCOUNTER — Other Ambulatory Visit: Payer: Self-pay | Admitting: Family

## 2018-05-11 DIAGNOSIS — F32A Depression, unspecified: Secondary | ICD-10-CM

## 2018-05-11 DIAGNOSIS — F329 Major depressive disorder, single episode, unspecified: Secondary | ICD-10-CM

## 2018-05-11 NOTE — Telephone Encounter (Signed)
HCTZ and potassium not on pt's active medication list, routed to PCP to review.

## 2018-05-12 ENCOUNTER — Telehealth: Payer: Self-pay | Admitting: Family

## 2018-05-12 NOTE — Telephone Encounter (Signed)
Patient advised.

## 2018-05-12 NOTE — Telephone Encounter (Signed)
Please let pt know that I see she requested refills of hctz and Kdur. I declined refills because we stopped these medications.

## 2018-05-19 ENCOUNTER — Telehealth: Payer: Self-pay | Admitting: *Deleted

## 2018-05-19 NOTE — Telephone Encounter (Signed)
Received Notice of Approval of Requested Services for Sleep Study/Titration; forwarded to provider/SLS 10/18

## 2018-06-25 ENCOUNTER — Encounter (HOSPITAL_BASED_OUTPATIENT_CLINIC_OR_DEPARTMENT_OTHER): Payer: Self-pay

## 2018-06-26 ENCOUNTER — Ambulatory Visit (HOSPITAL_BASED_OUTPATIENT_CLINIC_OR_DEPARTMENT_OTHER): Payer: PPO | Attending: Family | Admitting: Pulmonary Disease

## 2018-06-26 VITALS — Ht 60.0 in | Wt 239.0 lb

## 2018-06-26 DIAGNOSIS — R400 Somnolence: Secondary | ICD-10-CM | POA: Diagnosis not present

## 2018-06-26 DIAGNOSIS — Z6841 Body Mass Index (BMI) 40.0 and over, adult: Secondary | ICD-10-CM | POA: Diagnosis not present

## 2018-06-29 ENCOUNTER — Telehealth: Payer: Self-pay | Admitting: Family

## 2018-07-03 ENCOUNTER — Telehealth: Payer: Self-pay | Admitting: Family

## 2018-07-03 DIAGNOSIS — R400 Somnolence: Secondary | ICD-10-CM

## 2018-07-03 NOTE — Telephone Encounter (Signed)
Please let pt know that her insurance has denied the sleep study in the lab. I am placing an order for a home sleep study instead.

## 2018-07-03 NOTE — Progress Notes (Deleted)
Noted. Will order home study instead.

## 2018-07-03 NOTE — Progress Notes (Signed)
Noted  

## 2018-07-03 NOTE — Procedures (Signed)
Patient Name: Jasmine Clayton, Jasmine Clayton Date: 06/26/2018 Gender: Female D.O.B: Jun 01, 1951 Age (years): 67 Referring Provider: Earlie Counts Height (inches): 76 Interpreting Physician: Kara Mead MD, ABSM Weight (lbs): 239 RPSGT: Carolin Coy BMI: 2 MRN: [id number] Neck Size: 16.25   CLINICAL INFORMATION Sleep Study Type: Split Night CPAP  Indication for sleep study: Excessive Daytime Sleepiness, Fatigue, Hypertension, Obesity  Epworth Sleepiness Score: 14  SLEEP STUDY TECHNIQUE As per the AASM Manual for the Scoring of Sleep and Associated Events v2.3 (April 2016) with a hypopnea requiring 4% desaturations.  The channels recorded and monitored were frontal, central and occipital EEG, electrooculogram (EOG), submentalis EMG (chin), nasal and oral airflow, thoracic and abdominal wall motion, anterior tibialis EMG, snore microphone, electrocardiogram, and pulse oximetry. Continuous positive airway pressure (CPAP) was initiated when the patient met split night criteria and was titrated according to treat sleep-disordered breathing.  MEDICATIONS Medications self-administered by patient taken the night of the study : N/A  RESPIRATORY PARAMETERS Diagnostic  Total AHI (/hr): 39.7 RDI (/hr): 49.9 OA Index (/hr): 11.5 CA Index (/hr): 0.0 REM AHI (/hr): 103.3 NREM AHI (/hr): 30.8 Supine AHI (/hr): 39.7 Non-supine AHI (/hr): 0 Min O2 Sat (%): 82.0 Mean O2 (%): 94.0 Time below 88% (min): 5.6   Titration  Optimal Pressure (cm): 12 AHI at Optimal Pressure (/hr): 0.0 Min O2 at Optimal Pressure (%): 92.0 Supine % at Optimal (%): 100 Sleep % at Optimal (%): 97   SLEEP ARCHITECTURE The recording time for the entire night was 387.2 minutes.  During a baseline period of 162.6 minutes, the patient slept for 140.7 minutes in REM and nonREM, yielding a sleep efficiency of 86.5%%. Sleep onset after lights out was 5.4 minutes with a REM latency of 27.5 minutes. The patient spent 14.7%% of the  night in stage N1 sleep, 72.5%% in stage N2 sleep, 0.0%% in stage N3 and 12.8% in REM.  During the titration period of 217.9 minutes, the patient slept for 193.5 minutes in REM and nonREM, yielding a sleep efficiency of 88.8%%. Sleep onset after CPAP initiation was 2.6 minutes with a REM latency of 62.5 minutes. The patient spent 11.6%% of the night in stage N1 sleep, 71.3%% in stage N2 sleep, 0.0%% in stage N3 and 17.1% in REM.  CARDIAC DATA The 2 lead EKG demonstrated sinus rhythm. The mean heart rate was 100.0 beats per minute. Other EKG findings include: None.   LEG MOVEMENT DATA The total Periodic Limb Movements of Sleep (PLMS) were 0. The PLMS index was 0.0 .  IMPRESSIONS Severe obstructive sleep apnea occurred during the diagnostic portion of the study (AHI = 39.7/hour). An optimal PAP pressure was selected for this patient ( 12 cm of water) No significant central sleep apnea occurred during the diagnostic portion of the study (CAI = 0.0/hour). Mild oxygen desaturation was noted during the diagnostic portion of the study (Min O2 = 82.0%). The patient snored with loud snoring volume during the diagnostic portion of the study. No cardiac abnormalities were noted during this study. Clinically significant periodic limb movements did not occur during sleep.   DIAGNOSIS Obstructive Sleep Apnea (327.23 [G47.33 ICD-10])    RECOMMENDATIONS Trial of CPAP therapy on 12 cm H2O with a Wide size Resmed Nasal Mask AirFit N10 mask and heated humidification. Avoid alcohol, sedatives and other CNS depressants that may worsen sleep apnea and disrupt normal sleep architecture. Sleep hygiene should be reviewed to assess factors that may improve sleep quality. Weight management and regular exercise should be initiated or  continued. Return to Sleep Center for re-evaluation after 4 weeks of therapy    Kara Mead MD Board Certified in [place]

## 2018-07-03 NOTE — Telephone Encounter (Signed)
Left detailed message on voicemail and to call if any questions or if she has not been contacted about the home sleep study in 1 week.

## 2018-07-04 ENCOUNTER — Encounter: Payer: Self-pay | Admitting: Family

## 2018-07-04 ENCOUNTER — Telehealth: Payer: Self-pay | Admitting: Family

## 2018-07-04 DIAGNOSIS — G4733 Obstructive sleep apnea (adult) (pediatric): Secondary | ICD-10-CM | POA: Diagnosis not present

## 2018-07-04 HISTORY — DX: Obstructive sleep apnea (adult) (pediatric): G47.33

## 2018-07-04 NOTE — Telephone Encounter (Signed)
Are you able to see the order for home sleep study?  If so, can you please cancel?  I am unable to see the order for some reason.

## 2018-07-04 NOTE — Telephone Encounter (Signed)
Please call pt and let her know that I reviewed her sleep study.  I see that she already completed it at the sleep center.  Sleep study shows severe sleep apnea I recommend that she start CPAP.  I am going to initiate home health to bring a CPAP machine to her.  I would like her to start CPAP and follow-up with me 3 to 4 weeks after starting CPAP.  Also please continue to work on diet/exercise and weight loss.

## 2018-07-05 NOTE — Telephone Encounter (Signed)
Home sleep study has been cancelled.

## 2018-07-05 NOTE — Progress Notes (Addendum)
Subjective:   Jasmine Clayton is a 67 y.o. female who presents for an Initial Medicare Annual Wellness Visit.  The Patient was informed that the wellness visit is to identify future health risk and educate and initiate measures that can reduce risk for increased disease through the lifespan.   Describes health as fair, good or great? Fair. Pt wishes she had more energy.   Review of Systems   No ROS.  Medicare Wellness Visit. Additional risk factors are reflected in the social history. Cardiac Risk Factors include: advanced age (>81men, >56 women);hypertension;obesity (BMI >30kg/m2);sedentary lifestyle Home Safety/Smoke Alarms: Feels safe in home. Smoke alarms in place. 6-8 stairs going into house. Uses hand rails. One story home. Lives alone.   Female:   Mammo- pt states she will schedule.       Dexa scan- utd       CCS-07/21/17 see care everywhere     Objective:    Today's Vitals   07/06/18 0914  BP: 130/82  Pulse: 73  SpO2: 97%  Weight: 234 lb 3.2 oz (106.2 kg)  Height: 5' (1.524 m)  PainSc: 0-No pain   Body mass index is 45.74 kg/m.  Advanced Directives 07/06/2018 06/26/2018 05/10/2017 05/17/2016 10/16/2015 09/04/2015 04/14/2015  Does Patient Have a Medical Advance Directive? No No Yes No No No No  Type of Advance Directive - - - - - - -  Does patient want to make changes to medical advance directive? - - No - Patient declined - - - -  Copy of [place] in Chart? - - - - - - -  Would patient like information on creating a medical advance directive? No - Patient declined No - Patient declined - No - patient declined information No - patient declined information No - patient declined information No - patient declined information  Pre-existing out of facility DNR order (yellow form or pink MOST form) - - - - - - -    Current Medications (verified) Outpatient Encounter Medications as of 07/06/2018  Medication Sig  . aspirin 81 MG tablet Take 81 mg by  mouth daily.    . B Complex Vitamins (B-COMPLEX/B-12 PO) Take 1 tablet by mouth daily.   Marland Kitchen buPROPion (WELLBUTRIN XL) 300 MG 24 hr tablet TAKE 1 TABLET(300 MG) BY MOUTH DAILY  . Calcium Carb-Cholecalciferol (CALCIUM-VITAMIN D3) 600-500 MG-UNIT CAPS Take 1 tablet by mouth 2 (two) times daily.  Marland Kitchen escitalopram (LEXAPRO) 20 MG tablet TAKE 1 TABLET(20 MG) BY MOUTH DAILY  . meloxicam (MOBIC) 7.5 MG tablet TAKE 1 TABLET(7.5 MG) BY MOUTH DAILY  . Omega-3 Fatty Acids (FISH OIL) 1200 MG CAPS Take 1 capsule by mouth daily.   Facility-Administered Encounter Medications as of 07/06/2018  Medication  . sodium chloride 0.9 % injection 10 mL    Allergies (verified) Patient has no known allergies.   History: Past Medical History:  Diagnosis Date  . Adenocarcinoma of colon ([place]) 04/12/2011   "rectal-colon"  . Corneal ulcers and infections 07/04/12  . Degenerative joint disease    right knee  . DVT (deep vein thrombosis) in pregnancy 1979   during pregnancy. lower extremity  . Family history of colon cancer   . Family history of uterine cancer   . History of chemotherapy     cycle 5 Folfox completed 09/23/11  . History of radiation therapy 10/25/11 -12/01/11   pelvis/presacral region  . Morbid obesity ([place])   . OSA (obstructive sleep apnea) 07/04/2018   Severe per split  night study 11/19  Trial of CPAP therapy on 12 cm H2O with a Wide size Resmed Nasal Mask AirFit N10 mask and heated humidification.  . Osteopenia   . 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 Stress incontinence, female    not current   Past Surgical History:  Procedure Laterality Date  . ABDOMINAL HYSTERECTOMY  3 years ago  . BREAST CYST EXCISION  1978  . CESAREAN SECTION    . COLON SURGERY     LAR, reoperation for necrotic ileostomy, transverse colostomy  . COLOSTOMY  05/27/11  . COLOSTOMY TAKEDOWN  07/11/2012   Procedure: COLOSTOMY TAKEDOWN;  Surgeon: Rolm Bookbinder, MD;  Location: [facility];  Service: General;  Laterality: N/A;  colostomy takedown  . JOINT REPLACEMENT  2002   hip replacement  . [place]  . MASS EXCISION  2011   "mass removed from ovaries"  . PORT-A-CATH REMOVAL  07/11/2012   Procedure: REMOVAL PORT-A-CATH;  Surgeon: Rolm Bookbinder, MD;  Location: [facility];  Service: General;  Laterality: N/A;  removal portacath  . PORTACATH PLACEMENT  07/26/11   right portacath  . TOTAL ABDOMINAL HYSTERECTOMY W/ BILATERAL SALPINGOOPHORECTOMY  11/30/09   BSO  . TOTAL HIP ARTHROPLASTY  2002   left hip   Family History  Problem Relation Age of Onset  . Uterine cancer Mother 2       deceased 11  . Diabetes Father   . Colon cancer Sister 53       currently 9  . Alcoholism Brother    Social History   Socioeconomic History  . Marital status: Widowed    Spouse name: Not on file  . Number of children: 1  . Years of education: Not on file  . Highest education level: Not on file  Occupational History  . 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 Financial resource strain: Not on file  . Food insecurity:    Worry: Not on file    Inability: Not on file  . Transportation needs:    Medical: Not on file    Non-medical: Not on file  Tobacco Use  . Smoking status: Never Smoker  . Smokeless tobacco: Never Used  Substance and Sexual Activity  . Alcohol use: Yes    Alcohol/week: 0.0 standard drinks    Comment: occasional 3 x a week (1 drink)  . Drug use: No  . Sexual activity: Not Currently  Lifestyle  . Physical activity:    Days per week: Not on file    Minutes per session: Not on file  . Stress: Not on file  Relationships  . Social connections:    Talks on phone: Not on file    Gets together: Not on file    Attends religious service: Not on file    Active member of club or organization: Not on file    Attends meetings of clubs or organizations: Not on file    Relationship  status: Not on file  Other Topics Concern  . Not on file  Social History Narrative   Regular exercise:  No   Caffeine Use:  1 cup coffee daily   Husband passed 2/17   Daughter, son-in-law and her 2 children- they recently moved out   Completed the 12th grade, and some college.   Does not work.     Tobacco Counseling Counseling given: Not Answered  Clinical Intake:     Pain Score: 0-No pain                  Activities of Daily Living In your present state of health, do you have any difficulty performing the following activities: 07/06/2018  Hearing? N  Vision? N  Difficulty concentrating or making decisions? N  Comment plays pet saga on her phone.  Walking or climbing stairs? Y  Dressing or bathing? N  Doing errands, shopping? N  Preparing Food and eating ? N  Using the Toilet? N  In the past six months, have you accidently leaked urine? N  Do you have problems with loss of bowel control? N  Managing your Medications? N  Managing your Finances? N  Housekeeping or managing your Housekeeping? N  Some recent data might be hidden     Immunizations and Health Maintenance Immunization History  Administered Date(s) Administered  . Influenza Split 08/28/2011  . Influenza, High Dose Seasonal PF 05/04/2017, 04/11/2018  . Influenza,inj,Quad PF,6+ Mos 06/04/2013, 04/05/2014, 05/26/2015, 04/23/2016  . Pneumococcal Conjugate-13 01/09/2018  . Pneumococcal Polysaccharide-23 07/17/2014  . Tdap 03/15/2011  . Zoster 02/16/2013   Health Maintenance Due  Topic Date Due  . Hepatitis C Screening  03/07/1951    Patient Care Team: Debbrah Alar, NP as PCP - General (Internal Medicine) Rolm Bookbinder, MD (General Surgery) Janie Morning, MD (Obstetrics and Gynecology)  Indicate any recent Medical Services you may have received from other than [facility] providers in the past year (date may be approximate).     Assessment:   This is a routine wellness  examination for [facility]. Physical assessment deferred to PCP.  Hearing/Vision screen Hearing Screening Comments: Able to hear conversational tones w/o difficulty. No issues reported.   Vision Screening Comments: Pt reports yearly eye exam with last 12/2017  Dietary issues and exercise activities discussed: Current Exercise Habits: The patient does not participate in regular exercise at present, Exercise limited by: None identified Diet (meal preparation, eat out, water intake, caffeinated beverages, dairy products, fruits and vegetables): in general, an "unhealthy" diet Breakfast: coffee Lunch: sandwich or frozen dinner. Dinner:  2 hotdogs. Needs to drink more water.  Goals    . Patient Stated     Patient would like to have the energy and focus to declutter her home.      Depression Screen PHQ 2/9 Scores 07/06/2018 01/09/2018 02/16/2017 08/25/2016 04/23/2016 07/26/2011  PHQ - 2 Score 4 4 3 6 3  0  PHQ- 9 Score 22 14 16 14 10  -    Fall Risk Fall Risk  07/06/2018 01/09/2018 09/04/2015 04/05/2014  Falls in the past year? 0 No No No  Risk for fall due to : - - History of fall(s);Medication side effect -    Cognitive Function: Ad8 score reviewed for issues:  Issues making decisions:no  Less interest in hobbies / activities:no  Repeats questions, stories (family complaining):no  Trouble using ordinary gadgets (microwave, computer, phone):no  Forgets the month or year: no  Mismanaging finances: no  Remembering appts:no  Daily problems with thinking and/or memory:no Ad8 score is=0       Screening Tests Health Maintenance  Topic Date Due  . Hepatitis C Screening  July 11, 1951  . MAMMOGRAM  05/18/2019  . PNA vac Low Risk Adult (2 of 2 - PPSV23) 07/18/2019  . COLONOSCOPY  07/21/2020  . TETANUS/TDAP  03/14/2021  . INFLUENZA VACCINE  Completed  . DEXA SCAN  Completed     Plan:  Please schedule your next medicare wellness visit with me in 1 yr.  Continue to eat heart healthy  diet (full of fruits, vegetables, whole grains, lean protein, water--limit salt, fat, and sugar intake) and increase physical activity as tolerated.  Continue doing brain stimulating activities (puzzles, reading, adult coloring books, staying active) to keep memory sharp.   Schedule mammogram as discussed   I have personally reviewed and noted the following in the patient's chart:   . Medical and social history . Use of alcohol, tobacco or illicit drugs  . Current medications and supplements . Functional ability and status . Nutritional status . Physical activity . Advanced directives . List of other physicians . Hospitalizations, surgeries, and ER visits in previous 12 months . Vitals . Screenings to include cognitive, depression, and falls . Referrals and appointments  In addition, I have reviewed and discussed with patient certain preventive protocols, quality metrics, and best practice recommendations. A written personalized care plan for preventive services as well as general preventive health recommendations were provided to patient.     Shela Nevin, RN   07/06/2018    I have reviewed the above MWE by Ms. Vevelyn Royals and agree with her documentation Denny Peon MD

## 2018-07-06 ENCOUNTER — Encounter: Payer: Self-pay | Admitting: *Deleted

## 2018-07-06 ENCOUNTER — Ambulatory Visit (INDEPENDENT_AMBULATORY_CARE_PROVIDER_SITE_OTHER): Payer: PPO | Admitting: *Deleted

## 2018-07-06 VITALS — BP 130/82 | HR 73 | Ht 60.0 in | Wt 234.2 lb

## 2018-07-06 DIAGNOSIS — Z0000 Encounter for general adult medical examination without abnormal findings: Secondary | ICD-10-CM

## 2018-07-06 NOTE — Telephone Encounter (Signed)
Left message for pt to return my call. Referral was placed to [facility] to set pt up on CPAP. [place] for [facility] / triage to discuss with pt.

## 2018-07-06 NOTE — Patient Instructions (Signed)
Plan:    Please schedule your next medicare wellness visit with me in 1 yr.  Continue to eat heart healthy diet (full of fruits, vegetables, whole grains, lean protein, water--limit salt, fat, and sugar intake) and increase physical activity as tolerated.  Continue doing brain stimulating activities (puzzles, reading, adult coloring books, staying active) to keep memory sharp.   Schedule mammogram as discussed   Jasmine Clayton , Thank you for taking time to come for your Medicare Wellness Visit. I appreciate your ongoing commitment to your health goals. Please review the following plan we discussed and let me know if I can assist you in the future.   These are the goals we discussed: Goals    . Patient Stated     Patient would like to have the energy and focus to declutter her home.       This is a list of the screening recommended for you and due dates:  Health Maintenance  Topic Date Due  .  Hepatitis C: One time screening is recommended by Center for Disease Control  (CDC) for  adults born from [birth year] through 1964.   Jul 06, 1951  . Mammogram  05/18/2019  . Pneumonia vaccines (2 of 2 - PPSV23) 07/18/2019  . Colon Cancer Screening  07/21/2020  . Tetanus Vaccine  03/14/2021  . Flu Shot  Completed  . DEXA scan (bone density measurement)  Completed    Kegel Exercises Kegel exercises help strengthen the muscles that support the rectum, vagina, small intestine, bladder, and uterus. Doing Kegel exercises can help:  Improve bladder and bowel control.  Improve sexual response.  Reduce problems and discomfort during pregnancy.  Kegel exercises involve squeezing your pelvic floor muscles, which are the same muscles you squeeze when you try to stop the flow of urine. The exercises can be done while sitting, standing, or lying down, but it is best to vary your position. Phase 1 exercises 1. Squeeze your pelvic floor muscles tight. You should feel a tight lift in your rectal area. If you  are a female, you should also feel a tightness in your vaginal area. Keep your stomach, buttocks, and legs relaxed. 2. Hold the muscles tight for up to 10 seconds. 3. Relax your muscles. Repeat this exercise 50 times a day or as many times as told by your health care provider. Continue to do this exercise for at least 4-6 weeks or for as long as told by your health care provider. This information is not intended to replace advice given to you by your health care provider. Make sure you discuss any questions you have with your health care provider. Document Released: 07/05/2012 Document Revised: 03/13/2016 Document Reviewed: 06/08/2015 Elsevier Interactive Patient Education  2018 [place] Maintenance for Postmenopausal Women Menopause is a normal process in which your reproductive ability comes to an end. This process happens gradually over a span of months to years, usually between the ages of 51 and 26. Menopause is complete when you have missed 12 consecutive menstrual periods. It is important to talk with your health care provider about some of the most common conditions that affect postmenopausal women, such as heart disease, cancer, and bone loss (osteoporosis). Adopting a healthy lifestyle and getting preventive care can help to promote your health and wellness. Those actions can also lower your chances of developing some of these common conditions. What should I know about menopause? During menopause, you may experience a number of symptoms, such as:  Moderate-to-severe hot flashes.  Night sweats.  Decrease in sex drive.  Mood swings.  Headaches.  Tiredness.  Irritability.  Memory problems.  Insomnia.  Choosing to treat or not to treat menopausal changes is an individual decision that you make with your health care provider. What should I know about hormone replacement therapy and supplements? Hormone therapy products are effective for treating symptoms that are  associated with menopause, such as hot flashes and night sweats. Hormone replacement carries certain risks, especially as you become older. If you are thinking about using estrogen or estrogen with progestin treatments, discuss the benefits and risks with your health care provider. What should I know about heart disease and stroke? Heart disease, heart attack, and stroke become more likely as you age. This may be due, in part, to the hormonal changes that your body experiences during menopause. These can affect how your body processes dietary fats, triglycerides, and cholesterol. Heart attack and stroke are both medical emergencies. There are many things that you can do to help prevent heart disease and stroke:  Have your blood pressure checked at least every 1-2 years. High blood pressure causes heart disease and increases the risk of stroke.  If you are 22-68 years old, ask your health care provider if you should take aspirin to prevent a heart attack or a stroke.  Do not use any tobacco products, including cigarettes, chewing tobacco, or electronic cigarettes. If you need help quitting, ask your health care provider.  It is important to eat a healthy diet and maintain a healthy weight. ? Be sure to include plenty of vegetables, fruits, low-fat dairy products, and lean protein. ? Avoid eating foods that are high in solid fats, added sugars, or salt (sodium).  Get regular exercise. This is one of the most important things that you can do for your health. ? Try to exercise for at least 150 minutes each week. The type of exercise that you do should increase your heart rate and make you sweat. This is known as moderate-intensity exercise. ? Try to do strengthening exercises at least twice each week. Do these in addition to the moderate-intensity exercise.  Know your numbers.Ask your health care provider to check your cholesterol and your blood glucose. Continue to have your blood tested as directed  by your health care provider.  What should I know about cancer screening? There are several types of cancer. Take the following steps to reduce your risk and to catch any cancer development as early as possible. Breast Cancer  Practice breast self-awareness. ? This means understanding how your breasts normally appear and feel. ? It also means doing regular breast self-exams. Let your health care provider know about any changes, no matter how small.  If you are 6 or older, have a clinician do a breast exam (clinical breast exam or CBE) every year. Depending on your age, family history, and medical history, it may be recommended that you also have a yearly breast X-ray (mammogram).  If you have a family history of breast cancer, talk with your health care provider about genetic screening.  If you are at high risk for breast cancer, talk with your health care provider about having an MRI and a mammogram every year.  Breast cancer (BRCA) gene test is recommended for women who have family members with BRCA-related cancers. Results of the assessment will determine the need for genetic counseling and BRCA1 and for BRCA2 testing. BRCA-related cancers include these types: ? Breast. This occurs in males or females. ?  Ovarian. ? Tubal. This may also be called fallopian tube cancer. ? Cancer of the abdominal or pelvic lining (peritoneal cancer). ? Prostate. ? Pancreatic.  Cervical, Uterine, and Ovarian Cancer Your health care provider may recommend that you be screened regularly for cancer of the pelvic organs. These include your ovaries, uterus, and vagina. This screening involves a pelvic exam, which includes checking for microscopic changes to the surface of your cervix (Pap test).  For women ages 21-65, health care providers may recommend a pelvic exam and a Pap test every three years. For women ages 63-65, they may recommend the Pap test and pelvic exam, combined with testing for human papilloma  virus (HPV), every five years. Some types of HPV increase your risk of cervical cancer. Testing for HPV may also be done on women of any age who have unclear Pap test results.  Other health care providers may not recommend any screening for nonpregnant women who are considered low risk for pelvic cancer and have no symptoms. Ask your health care provider if a screening pelvic exam is right for you.  If you have had past treatment for cervical cancer or a condition that could lead to cancer, you need Pap tests and screening for cancer for at least 20 years after your treatment. If Pap tests have been discontinued for you, your risk factors (such as having a new sexual partner) need to be reassessed to determine if you should start having screenings again. Some women have medical problems that increase the chance of getting cervical cancer. In these cases, your health care provider may recommend that you have screening and Pap tests more often.  If you have a family history of uterine cancer or ovarian cancer, talk with your health care provider about genetic screening.  If you have vaginal bleeding after reaching menopause, tell your health care provider.  There are currently no reliable tests available to screen for ovarian cancer.  Lung Cancer Lung cancer screening is recommended for adults 2-22 years old who are at high risk for lung cancer because of a history of smoking. A yearly low-dose CT scan of the lungs is recommended if you:  Currently smoke.  Have a history of at least 30 pack-years of smoking and you currently smoke or have quit within the past 15 years. A pack-year is smoking an average of one pack of cigarettes per day for one year.  Yearly screening should:  Continue until it has been 15 years since you quit.  Stop if you develop a health problem that would prevent you from having lung cancer treatment.  Colorectal Cancer  This type of cancer can be detected and can often  be prevented.  Routine colorectal cancer screening usually begins at age 7 and continues through age 79.  If you have risk factors for colon cancer, your health care provider may recommend that you be screened at an earlier age.  If you have a family history of colorectal cancer, talk with your health care provider about genetic screening.  Your health care provider may also recommend using home test kits to check for hidden blood in your stool.  A small camera at the end of a tube can be used to examine your colon directly (sigmoidoscopy or colonoscopy). This is done to check for the earliest forms of colorectal cancer.  Direct examination of the colon should be repeated every 5-10 years until age 11. However, if early forms of precancerous polyps or small growths are found or  if you have a family history or genetic risk for colorectal cancer, you may need to be screened more often.  Skin Cancer  Check your skin from head to toe regularly.  Monitor any moles. Be sure to tell your health care provider: ? About any new moles or changes in moles, especially if there is a change in a mole's shape or color. ? If you have a mole that is larger than the size of a pencil eraser.  If any of your family members has a history of skin cancer, especially at a young age, talk with your health care provider about genetic screening.  Always use sunscreen. Apply sunscreen liberally and repeatedly throughout the day.  Whenever you are outside, protect yourself by wearing long sleeves, pants, a wide-brimmed hat, and sunglasses.  What should I know about osteoporosis? Osteoporosis is a condition in which bone destruction happens more quickly than new bone creation. After menopause, you may be at an increased risk for osteoporosis. To help prevent osteoporosis or the bone fractures that can happen because of osteoporosis, the following is recommended:  If you are 5-71 years old, get at least 1,000 mg of  calcium and at least 600 mg of vitamin D per day.  If you are older than age 15 but younger than age 32, get at least 1,200 mg of calcium and at least 600 mg of vitamin D per day.  If you are older than age 52, get at least 1,200 mg of calcium and at least 800 mg of vitamin D per day.  Smoking and excessive alcohol intake increase the risk of osteoporosis. Eat foods that are rich in calcium and vitamin D, and do weight-bearing exercises several times each week as directed by your health care provider. What should I know about how menopause affects my mental health? Depression may occur at any age, but it is more common as you become older. Common symptoms of depression include:  Low or sad mood.  Changes in sleep patterns.  Changes in appetite or eating patterns.  Feeling an overall lack of motivation or enjoyment of activities that you previously enjoyed.  Frequent crying spells.  Talk with your health care provider if you think that you are experiencing depression. What should I know about immunizations? It is important that you get and maintain your immunizations. These include:  Tetanus, diphtheria, and pertussis (Tdap) booster vaccine.  Influenza every year before the flu season begins.  Pneumonia vaccine.  Shingles vaccine.  Your health care provider may also recommend other immunizations. This information is not intended to replace advice given to you by your health care provider. Make sure you discuss any questions you have with your health care provider. Document Released: 09/10/2005 Document Revised: 02/06/2016 Document Reviewed: 04/22/2015 Elsevier Interactive Patient Education  2018 [employer].

## 2018-07-12 NOTE — Telephone Encounter (Signed)
Notified pt and she is agreeable to start CPAP.  Sent message to Dorann Lodge at [facility] regarding CPAP start.

## 2018-07-31 ENCOUNTER — Ambulatory Visit (INDEPENDENT_AMBULATORY_CARE_PROVIDER_SITE_OTHER): Payer: PPO | Admitting: Family

## 2018-07-31 ENCOUNTER — Encounter: Payer: Self-pay | Admitting: Family

## 2018-07-31 VITALS — BP 141/66 | HR 87 | Temp 98.7°F | Resp 16 | Ht 60.0 in | Wt 234.0 lb

## 2018-07-31 DIAGNOSIS — F32A Depression, unspecified: Secondary | ICD-10-CM

## 2018-07-31 DIAGNOSIS — I10 Essential (primary) hypertension: Secondary | ICD-10-CM

## 2018-07-31 DIAGNOSIS — F329 Major depressive disorder, single episode, unspecified: Secondary | ICD-10-CM

## 2018-07-31 DIAGNOSIS — G4733 Obstructive sleep apnea (adult) (pediatric): Secondary | ICD-10-CM | POA: Diagnosis not present

## 2018-07-31 MED ORDER — BUPROPION HCL ER (XL) 300 MG PO TB24: ORAL_TABLET | ORAL | 3 refills | Status: DC

## 2018-07-31 MED ORDER — ESCITALOPRAM OXALATE 20 MG PO TABS: ORAL_TABLET | ORAL | 3 refills | Status: DC

## 2018-07-31 NOTE — Progress Notes (Signed)
Subjective:    Patient ID: Jasmine Clayton, female    DOB: 09-21-1950, 67 y.o.   MRN: [id number]  HPI  Patient is a 67 yr old female who presents today for follow up.  HTN- currently off of antihypertensives.  BP Readings from Last 3 Encounters:  07/31/18 (!) 141/66  07/06/18 130/82  04/25/18 138/82   Depression- maintained on lexapro and wellbutrin. Notes some increased energy.  OSA- recently diagnosed.  Has not started cpap yet. "I won't let anyone into my house due to the clutter."     Review of Systems See HPI  Past Medical History:  Diagnosis Date  . Adenocarcinoma of colon ([place]) 04/12/2011   "rectal-colon"  . Corneal ulcers and infections 07/04/12  . Degenerative joint disease    right knee  . DVT (deep vein thrombosis) in pregnancy 1979   during pregnancy. lower extremity  . Family history of colon cancer   . Family history of uterine cancer   . History of chemotherapy     cycle 5 Folfox completed 09/23/11  . History of radiation therapy 10/25/11 -12/01/11   pelvis/presacral region  . Morbid obesity ([place])   . OSA (obstructive sleep apnea) 07/04/2018   Severe per split night study 11/19  Trial of CPAP therapy on 12 cm H2O with a Wide size Resmed Nasal Mask AirFit N10 mask and heated humidification.  . Osteopenia   . 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 Stress incontinence, female    not current     Social History   Socioeconomic History  . Marital status: Widowed    Spouse name: Not on file  . Number of children: 1  . Years of education: Not on file  . Highest education level: Not on file  Occupational History  . 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 Financial resource strain: Not on file  . Food insecurity:    Worry: Not on file    Inability: Not on file  . Transportation needs:    Medical: Not on file    Non-medical: Not on file  Tobacco Use    . Smoking status: Never Smoker  . Smokeless tobacco: Never Used  Substance and Sexual Activity  . Alcohol use: Yes    Alcohol/week: 0.0 standard drinks    Comment: occasional 3 x a week (1 drink)  . Drug use: No  . Sexual activity: Not Currently  Lifestyle  . Physical activity:    Days per week: Not on file    Minutes per session: Not on file  . Stress: Not on file  Relationships  . Social connections:    Talks on phone: Not on file    Gets together: Not on file    Attends religious service: Not on file    Active member of club or organization: Not on file    Attends meetings of clubs or organizations: Not on file    Relationship status: Not on file  . Intimate partner violence:    Fear of current or ex partner: Not on file    Emotionally abused: Not on file    Physically abused: Not on file    Forced sexual activity: Not on file  Other Topics Concern  . Not on file  Social History Narrative   Regular exercise:  No   Caffeine Use:  1 cup coffee daily   Husband passed 2/17  Daughter, son-in-law and her 2 children- they recently moved out   Completed the 12th grade, and some college.   Does not work.     Past Surgical History:  Procedure Laterality Date  . ABDOMINAL HYSTERECTOMY  3 years ago  . BREAST CYST EXCISION  1978  . CESAREAN SECTION    . COLON SURGERY     LAR, reoperation for necrotic ileostomy, transverse colostomy  . COLOSTOMY  05/27/11  . COLOSTOMY TAKEDOWN  07/11/2012   Procedure: COLOSTOMY TAKEDOWN;  Surgeon: Rolm Bookbinder, MD;  Location: [facility];  Service: General;  Laterality: N/A;  colostomy takedown  . JOINT REPLACEMENT  2002   hip replacement  . [place]  . MASS EXCISION  2011   "mass removed from ovaries"  . PORT-A-CATH REMOVAL  07/11/2012   Procedure: REMOVAL PORT-A-CATH;  Surgeon: Rolm Bookbinder, MD;  Location: [facility];  Service: General;  Laterality: N/A;  removal portacath  . PORTACATH PLACEMENT  07/26/11   right  portacath  . TOTAL ABDOMINAL HYSTERECTOMY W/ BILATERAL SALPINGOOPHORECTOMY  11/30/09   BSO  . TOTAL HIP ARTHROPLASTY  2002   left hip    Family History  Problem Relation Age of Onset  . Uterine cancer Mother 79       deceased 21  . Diabetes Father   . Colon cancer Sister 59       currently 22  . Alcoholism Brother     No Known Allergies  Current Outpatient Medications on File Prior to Visit  Medication Sig Dispense Refill  . aspirin 81 MG tablet Take 81 mg by mouth daily.      . B Complex Vitamins (B-COMPLEX/B-12 PO) Take 1 tablet by mouth daily.     Marland Kitchen buPROPion (WELLBUTRIN XL) 300 MG 24 hr tablet TAKE 1 TABLET(300 MG) BY MOUTH DAILY 30 tablet 2  . Calcium Carb-Cholecalciferol (CALCIUM-VITAMIN D3) 600-500 MG-UNIT CAPS Take 1 tablet by mouth 2 (two) times daily. 60 capsule 0  . escitalopram (LEXAPRO) 20 MG tablet TAKE 1 TABLET(20 MG) BY MOUTH DAILY 30 tablet 0  . meloxicam (MOBIC) 7.5 MG tablet TAKE 1 TABLET(7.5 MG) BY MOUTH DAILY 90 tablet 0  . Omega-3 Fatty Acids (FISH OIL) 1200 MG CAPS Take 1 capsule by mouth daily.     Current Facility-Administered Medications on File Prior to Visit  Medication Dose Route Frequency Provider Last Rate Last Dose  . sodium chloride 0.9 % injection 10 mL  10 mL Intravenous PRN [name], MD   10 mL at 05/25/12 1153    BP (!) 141/66 (BP Location: Right Arm, Patient Position: Sitting, Cuff Size: Large)   Pulse 87   Temp 98.7 F (37.1 C) (Oral)   Resp 16   Ht 5' (1.524 m)   Wt 234 lb (106.1 kg)   LMP 11/30/2009   SpO2 96%   BMI 45.70 kg/m       Objective:   Physical Exam Constitutional:      Appearance: She is well-developed.  Neck:     Musculoskeletal: Neck supple.     Thyroid: No thyromegaly.  Cardiovascular:     Rate and Rhythm: Normal rate and regular rhythm.     Heart sounds: Normal heart sounds. No murmur.  Pulmonary:     Effort: Pulmonary effort is normal. No respiratory distress.     Breath sounds: Normal breath  sounds. No wheezing.  Skin:    General: Skin is warm and dry.  Neurological:     Mental  Status: She is alert and oriented to person, place, and time.  Psychiatric:        Behavior: Behavior normal.        Thought Content: Thought content normal.        Judgment: Judgment normal.           Assessment & Plan:  HTN- BP is fair today. Will monitor off of meds.  Depression- PHQ-9 is improving.  Continue current meds. Depression screen [facility] 2/9 07/31/2018 07/06/2018 01/09/2018  Decreased Interest 2 2 3   Down, Depressed, Hopeless 1 2 1   PHQ - 2 Score 3 4 4   Altered sleeping 1 3 3   Tired, decreased energy 2 3 3   Change in appetite 1 3 1   Feeling bad or failure about yourself  1 3 2   Trouble concentrating 1 3 0  Moving slowly or fidgety/restless 1 3 1   Suicidal thoughts 0 0 0  PHQ-9 Score 10 22 14   Difficult doing work/chores Somewhat difficult Very difficult Extremely dIfficult  Some recent data might be hidden   OSA- reinforced the importance of getting started on cpap as soon as possible.  Discussed risks of untreated osa.

## 2018-08-29 ENCOUNTER — Ambulatory Visit (INDEPENDENT_AMBULATORY_CARE_PROVIDER_SITE_OTHER): Payer: PPO | Admitting: Psychology

## 2018-08-29 DIAGNOSIS — F331 Major depressive disorder, recurrent, moderate: Secondary | ICD-10-CM

## 2018-09-26 ENCOUNTER — Other Ambulatory Visit: Payer: Self-pay | Admitting: Family

## 2018-10-26 ENCOUNTER — Telehealth: Payer: Self-pay | Admitting: *Deleted

## 2018-10-26 NOTE — Telephone Encounter (Signed)
Left detailed message on voicemail to call about visit on 10/30/18 with Debbrah Alar NP. We can offer pt webex (video visit). [place] for [facility] to obtain current email address and let pt know that our office will follow up with pt to walk her through the steps to set up the visit.

## 2018-10-26 NOTE — Telephone Encounter (Signed)
Called patient and she will go ahead with virtual visit, advised to download webex and email address verified.

## 2018-10-30 ENCOUNTER — Ambulatory Visit (INDEPENDENT_AMBULATORY_CARE_PROVIDER_SITE_OTHER): Payer: PPO | Admitting: Family

## 2018-10-30 ENCOUNTER — Other Ambulatory Visit: Payer: Self-pay

## 2018-10-30 DIAGNOSIS — F32A Depression, unspecified: Secondary | ICD-10-CM

## 2018-10-30 DIAGNOSIS — M1990 Unspecified osteoarthritis, unspecified site: Secondary | ICD-10-CM | POA: Diagnosis not present

## 2018-10-30 DIAGNOSIS — I10 Essential (primary) hypertension: Secondary | ICD-10-CM | POA: Diagnosis not present

## 2018-10-30 DIAGNOSIS — F329 Major depressive disorder, single episode, unspecified: Secondary | ICD-10-CM | POA: Diagnosis not present

## 2018-10-30 MED ORDER — MELOXICAM 7.5 MG PO TABS: ORAL_TABLET | ORAL | 0 refills | Status: DC

## 2018-10-30 MED ORDER — BUPROPION HCL ER (XL) 300 MG PO TB24: ORAL_TABLET | ORAL | 0 refills | Status: DC

## 2018-10-30 MED ORDER — ESCITALOPRAM OXALATE 20 MG PO TABS: ORAL_TABLET | ORAL | 1 refills | Status: DC

## 2018-10-30 NOTE — Progress Notes (Signed)
Virtual Visit via Video Note  I connected with Jasmine Clayton on 10/30/18 at  9:40 AM EDT by a video enabled telemedicine application and verified that I am speaking with the correct person using two identifiers.  The patient was in her car and I was in my office at the time of the video visit.    I discussed the limitations of evaluation and management by telemedicine and the availability of in person appointments. The patient expressed understanding and agreed to proceed.  History of Present Illness:   HTN- denies CP/SOB or swelling.   BP Readings from Last 3 Encounters:  07/31/18 (!) 141/66  07/06/18 130/82  04/25/18 138/82   OA- reports that she ran out of meloxicam, reports it was helping knee and back pain when she was on it.   Depression- pt reports that she has had more time to herself recently due to covid 19 and the fact that her son-in-law has been working from home and has been able to watch her grandchildren. As a result she is beginning to feel more motivated to work on cleaning out her home which has become extremely unorganized.   Observations/Objective:   Gen:  Awake, alert, no acute distress, obese Resp: Breathing is even and non-labored Psych: calm/pleasant demeanor Neuro: Alert and Oriented x 3, + facial symmetry, speech is clear.  Assessment and Plan:  Osteoarthritis- restart prn meloxicam as this was really helping her pain  Depression- stable on lexapro. Continue same.  I counseled the patient on the importance of working on her home so she can feel a sense of accomplishment and peace. This has really been bothering her  She seems motivated to work on this.  HTN- she does not have a home bp cuff. Continue current meds. Advised follow up in the office in 3 months. She will come in about 1 month for a bmet when Covid-19 calms down.  We are trying to keep patients at home for social distancing purposes.    Follow Up Instructions:    I discussed the  assessment and treatment plan with the patient. The patient was provided an opportunity to ask questions and all were answered. The patient agreed with the plan and demonstrated an understanding of the instructions.   The patient was advised to call back or seek an in-person evaluation if the symptoms worsen or if the condition fails to improve as anticipated.  I provided 12 minutes of non-face-to-face time during this encounter.   Nance Pear, NP

## 2018-11-06 ENCOUNTER — Ambulatory Visit (INDEPENDENT_AMBULATORY_CARE_PROVIDER_SITE_OTHER): Payer: PPO | Admitting: Psychology

## 2018-11-06 DIAGNOSIS — F331 Major depressive disorder, recurrent, moderate: Secondary | ICD-10-CM

## 2019-01-19 ENCOUNTER — Other Ambulatory Visit: Payer: Self-pay

## 2019-01-19 NOTE — Progress Notes (Unsigned)
Please advise if ok to refill. 

## 2019-01-24 ENCOUNTER — Other Ambulatory Visit: Payer: Self-pay

## 2019-01-24 MED ORDER — BUPROPION HCL ER (XL) 300 MG PO TB24: ORAL_TABLET | ORAL | 0 refills | Status: DC

## 2019-01-29 ENCOUNTER — Ambulatory Visit (INDEPENDENT_AMBULATORY_CARE_PROVIDER_SITE_OTHER): Payer: PPO | Admitting: Psychology

## 2019-01-29 DIAGNOSIS — F331 Major depressive disorder, recurrent, moderate: Secondary | ICD-10-CM

## 2019-02-06 ENCOUNTER — Other Ambulatory Visit: Payer: Self-pay | Admitting: Family

## 2019-04-30 ENCOUNTER — Ambulatory Visit (INDEPENDENT_AMBULATORY_CARE_PROVIDER_SITE_OTHER): Payer: PPO | Admitting: Psychology

## 2019-04-30 DIAGNOSIS — F331 Major depressive disorder, recurrent, moderate: Secondary | ICD-10-CM | POA: Diagnosis not present

## 2019-05-08 ENCOUNTER — Telehealth: Payer: Self-pay | Admitting: Family

## 2019-05-08 ENCOUNTER — Other Ambulatory Visit: Payer: Self-pay | Admitting: Family

## 2019-05-08 DIAGNOSIS — F32A Depression, unspecified: Secondary | ICD-10-CM

## 2019-05-08 DIAGNOSIS — F329 Major depressive disorder, single episode, unspecified: Secondary | ICD-10-CM

## 2019-05-08 MED ORDER — ESCITALOPRAM OXALATE 20 MG PO TABS: ORAL_TABLET | ORAL | 1 refills | Status: DC

## 2019-05-08 NOTE — Telephone Encounter (Signed)
Please contact pt to arrange follow up visit.

## 2019-05-09 ENCOUNTER — Other Ambulatory Visit: Payer: Self-pay

## 2019-05-10 ENCOUNTER — Other Ambulatory Visit: Payer: Self-pay | Admitting: *Deleted

## 2019-05-10 MED ORDER — BUPROPION HCL ER (XL) 300 MG PO TB24: ORAL_TABLET | ORAL | 0 refills | Status: DC

## 2019-05-15 ENCOUNTER — Ambulatory Visit (INDEPENDENT_AMBULATORY_CARE_PROVIDER_SITE_OTHER): Payer: PPO | Admitting: Family

## 2019-05-15 ENCOUNTER — Other Ambulatory Visit: Payer: Self-pay

## 2019-05-15 ENCOUNTER — Encounter: Payer: Self-pay | Admitting: Family

## 2019-05-15 VITALS — BP 141/66 | HR 77 | Temp 96.5°F | Resp 16 | Wt 231.0 lb

## 2019-05-15 DIAGNOSIS — Z23 Encounter for immunization: Secondary | ICD-10-CM

## 2019-05-15 DIAGNOSIS — I10 Essential (primary) hypertension: Secondary | ICD-10-CM | POA: Diagnosis not present

## 2019-05-15 DIAGNOSIS — M1711 Unilateral primary osteoarthritis, right knee: Secondary | ICD-10-CM | POA: Diagnosis not present

## 2019-05-15 DIAGNOSIS — F329 Major depressive disorder, single episode, unspecified: Secondary | ICD-10-CM

## 2019-05-15 DIAGNOSIS — F32A Depression, unspecified: Secondary | ICD-10-CM

## 2019-05-15 LAB — BASIC METABOLIC PANEL
BUN: 17 mg/dL (ref 6–23)
CO2: 28 mEq/L (ref 19–32)
Calcium: 9.8 mg/dL (ref 8.4–10.5)
Chloride: 99 mEq/L (ref 96–112)
Creatinine, Ser: 1.32 mg/dL — ABNORMAL HIGH (ref 0.40–1.20)
GFR: 48.54 mL/min — ABNORMAL LOW (ref >60.00)
Glucose, Bld: 92 mg/dL (ref 70–99)
Potassium: 3.8 mEq/L (ref 3.5–5.1)
Sodium: 138 mEq/L (ref 135–145)

## 2019-05-15 NOTE — Progress Notes (Signed)
Subjective:    Patient ID: Jasmine Clayton, female    DOB: April 05, 1951, 68 y.o.   MRN: [id number]  HPI  Patient is a 68 yr old female who presents today for follow up.   HTN-  BP Readings from Last 3 Encounters:  05/15/19 (!) 141/66  07/31/18 (!) 141/66  07/06/18 130/82   Osteoarthritis- last visit we restarted meloxicam. She reports that this is helping her arthritis.   Depression- maintained on lexapro.  She reports that her mood is doing a lot better than it was.  She reports that her sister and she started to work on her house.    Wt Readings from Last 3 Encounters:  05/15/19 231 lb (104.8 kg)  07/31/18 234 lb (106.1 kg)  07/06/18 234 lb 3.2 oz (106.2 kg)   Flu shot today.  Review of Systems    see HPI  Past Medical History:  Diagnosis Date  . Adenocarcinoma of colon ([place]) 04/12/2011   "rectal-colon"  . Corneal ulcers and infections 07/04/12  . Degenerative joint disease    right knee  . DVT (deep vein thrombosis) in pregnancy 1979   during pregnancy. lower extremity  . Family history of colon cancer   . Family history of uterine cancer   . History of chemotherapy     cycle 5 Folfox completed 09/23/11  . History of radiation therapy 10/25/11 -12/01/11   pelvis/presacral region  . Morbid obesity ([place])   . OSA (obstructive sleep apnea) 07/04/2018   Severe per split night study 11/19  Trial of CPAP therapy on 12 cm H2O with a Wide size Resmed Nasal Mask AirFit N10 mask and heated humidification.  . Osteopenia   . 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 Stress incontinence, female    not current     Social History   Socioeconomic History  . Marital status: Widowed    Spouse name: Not on file  . Number of children: 1  . Years of education: Not on file  . Highest education level: Not on file  Occupational History  . 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  Financial resource strain: Not on file  . Food insecurity    Worry: Not on file    Inability: Not on file  . Transportation needs    Medical: Not on file    Non-medical: Not on file  Tobacco Use  . Smoking status: Never Smoker  . Smokeless tobacco: Never Used  Substance and Sexual Activity  . Alcohol use: Yes    Alcohol/week: 0.0 standard drinks    Comment: occasional 3 x a week (1 drink)  . Drug use: No  . Sexual activity: Not Currently  Lifestyle  . Physical activity    Days per week: Not on file    Minutes per session: Not on file  . Stress: Not on file  Relationships  . Social Herbalist on phone: Not on file    Gets together: Not on file    Attends religious service: Not on file    Active member of club or organization: Not on file    Attends meetings of clubs or organizations: Not on file    Relationship status: Not on file  . Intimate partner violence    Fear of current or ex partner: Not on file    Emotionally abused: Not on file  Physically abused: Not on file    Forced sexual activity: Not on file  Other Topics Concern  . Not on file  Social History Narrative   Regular exercise:  No   Caffeine Use:  1 cup coffee daily   Husband passed 2/17   Daughter, son-in-law and her 2 children- they recently moved out   Completed the 12th grade, and some college.   Does not work.     Past Surgical History:  Procedure Laterality Date  . ABDOMINAL HYSTERECTOMY  3 years ago  . BREAST CYST EXCISION  1978  . CESAREAN SECTION    . COLON SURGERY     LAR, reoperation for necrotic ileostomy, transverse colostomy  . COLOSTOMY  05/27/11  . COLOSTOMY TAKEDOWN  07/11/2012   Procedure: COLOSTOMY TAKEDOWN;  Surgeon: Rolm Bookbinder, MD;  Location: [facility];  Service: General;  Laterality: N/A;  colostomy takedown  . JOINT REPLACEMENT  2002   hip replacement  . [place]  . MASS EXCISION  2011   "mass removed from ovaries"  . PORT-A-CATH REMOVAL   07/11/2012   Procedure: REMOVAL PORT-A-CATH;  Surgeon: Rolm Bookbinder, MD;  Location: [facility];  Service: General;  Laterality: N/A;  removal portacath  . PORTACATH PLACEMENT  07/26/11   right portacath  . TOTAL ABDOMINAL HYSTERECTOMY W/ BILATERAL SALPINGOOPHORECTOMY  11/30/09   BSO  . TOTAL HIP ARTHROPLASTY  2002   left hip    Family History  Problem Relation Age of Onset  . Uterine cancer Mother 13       deceased 75  . Diabetes Father   . Colon cancer Sister 51       currently 43  . Alcoholism Brother     No Known Allergies  Current Outpatient Medications on File Prior to Visit  Medication Sig Dispense Refill  . aspirin 81 MG tablet Take 81 mg by mouth daily.      . B Complex Vitamins (B-COMPLEX/B-12 PO) Take 1 tablet by mouth daily.     Marland Kitchen buPROPion (WELLBUTRIN XL) 300 MG 24 hr tablet Take 1 tab by mouth once daily 90 tablet 0  . Calcium Carb-Cholecalciferol (CALCIUM-VITAMIN D3) 600-500 MG-UNIT CAPS Take 1 tablet by mouth 2 (two) times daily. 60 capsule 0  . escitalopram (LEXAPRO) 20 MG tablet TAKE 1 TABLET(20 MG) BY MOUTH DAILY 90 tablet 1  . meloxicam (MOBIC) 7.5 MG tablet TAKE 1 TABLET(7.5 MG) BY MOUTH DAILY 90 tablet 0  . Omega-3 Fatty Acids (FISH OIL) 1200 MG CAPS Take 1 capsule by mouth daily.     Current Facility-Administered Medications on File Prior to Visit  Medication Dose Route Frequency Provider Last Rate Last Dose  . sodium chloride 0.9 % injection 10 mL  10 mL Intravenous PRN [name], MD   10 mL at 05/25/12 1153    BP (!) 141/66 (BP Location: Right Arm, Patient Position: Sitting, Cuff Size: Large)   Pulse 77   Temp (!) 96.5 F (35.8 C) (Temporal)   Resp 16   Wt 231 lb (104.8 kg)   LMP 11/30/2009   SpO2 98%   BMI 45.11 kg/m    Objective:   Physical Exam Constitutional:      Appearance: She is well-developed.  Neck:     Musculoskeletal: Neck supple.     Thyroid: No thyromegaly.  Cardiovascular:     Rate and Rhythm: Normal rate and  regular rhythm.     Heart sounds: Normal heart sounds. No murmur.  Pulmonary:  Effort: Pulmonary effort is normal. No respiratory distress.     Breath sounds: Normal breath sounds. No wheezing.  Skin:    General: Skin is warm and dry.  Neurological:     Mental Status: She is alert and oriented to person, place, and time.  Psychiatric:        Behavior: Behavior normal.        Thought Content: Thought content normal.        Judgment: Judgment normal.           Assessment & Plan:  HTN- bp stable. Not currently on an antihypertensive. Obtain bmet.  Depression- improved. Continue wellbutrin and lexapro.  OA- pain is improved on meloxicam. Continue same.

## 2019-05-16 ENCOUNTER — Telehealth: Payer: Self-pay | Admitting: Family

## 2019-05-16 ENCOUNTER — Other Ambulatory Visit: Payer: Self-pay

## 2019-05-16 DIAGNOSIS — N289 Disorder of kidney and ureter, unspecified: Secondary | ICD-10-CM

## 2019-05-16 NOTE — Telephone Encounter (Signed)
Please let pt know that I reviewed her most recent lab work and it shows that her kidney function is mildly abnormal. I would like her to stop mobic (as this can affect kidney function). She can switch to tylenol as needed for pain. Also, I would like her to repeat BMET in 1 month please. Dx renal insufficiency.

## 2019-05-16 NOTE — Telephone Encounter (Signed)
Patient advised of results and provider's advise. She will dc mobic and cone in for labs on 06-15-2019

## 2019-06-15 ENCOUNTER — Other Ambulatory Visit: Payer: Self-pay

## 2019-06-15 ENCOUNTER — Other Ambulatory Visit (INDEPENDENT_AMBULATORY_CARE_PROVIDER_SITE_OTHER): Payer: PPO

## 2019-06-15 ENCOUNTER — Encounter: Payer: Self-pay | Admitting: Family

## 2019-06-15 DIAGNOSIS — N289 Disorder of kidney and ureter, unspecified: Secondary | ICD-10-CM

## 2019-06-15 LAB — BASIC METABOLIC PANEL
BUN: 16 mg/dL (ref 6–23)
CO2: 27 mEq/L (ref 19–32)
Calcium: 9.1 mg/dL (ref 8.4–10.5)
Chloride: 106 mEq/L (ref 96–112)
Creatinine, Ser: 1.18 mg/dL (ref 0.40–1.20)
GFR: 55.23 mL/min — ABNORMAL LOW (ref >60.00)
Glucose, Bld: 117 mg/dL — ABNORMAL HIGH (ref 70–99)
Potassium: 3.7 mEq/L (ref 3.5–5.1)
Sodium: 143 mEq/L (ref 135–145)

## 2019-06-18 NOTE — Progress Notes (Signed)
Mailed out to pt 

## 2019-06-20 ENCOUNTER — Encounter (HOSPITAL_BASED_OUTPATIENT_CLINIC_OR_DEPARTMENT_OTHER): Payer: Self-pay

## 2019-06-20 ENCOUNTER — Other Ambulatory Visit: Payer: Self-pay

## 2019-06-20 ENCOUNTER — Emergency Department (HOSPITAL_BASED_OUTPATIENT_CLINIC_OR_DEPARTMENT_OTHER)
Admission: EM | Admit: 2019-06-20 | Discharge: 2019-06-20 | Disposition: A | Discharge status: Home / Self Care | Payer: PPO | Attending: Emergency Medicine | Admitting: Emergency Medicine

## 2019-06-20 DIAGNOSIS — Z96642 Presence of left artificial hip joint: Secondary | ICD-10-CM | POA: Diagnosis not present

## 2019-06-20 DIAGNOSIS — Z85038 Personal history of other malignant neoplasm of large intestine: Secondary | ICD-10-CM | POA: Insufficient documentation

## 2019-06-20 DIAGNOSIS — N644 Mastodynia: Secondary | ICD-10-CM | POA: Insufficient documentation

## 2019-06-20 NOTE — ED Notes (Signed)
ED Provider at bedside. 

## 2019-06-20 NOTE — ED Notes (Addendum)
Pt reports tenderness/soreness to left breast starting yesterday, with some drainage from her nipple, unsure of color states she noticed some wetness on her bra. Pt reports her most recent mammogram was missed related to fear of being exposed to Covid  (pt has not had Covid).

## 2019-06-20 NOTE — ED Triage Notes (Signed)
Pt c/o pain, swelling to left breast and pain to left axilla x 2 days-denies injury-states she had similar pain to right breast ~40 years ago and was dx with breast cyst-NAD-steady gait

## 2019-06-20 NOTE — ED Provider Notes (Signed)
[facility] Emergency Department Provider Note MRN:  [id number]  Arrival date & time: 06/20/19     Chief Complaint   Breast Pain   History of Present Illness   Jasmine Clayton is a 68 y.o. year-old female with a history of colon cancer, breast cancer presenting to the ED with chief complaint of breast pain.  Patient noticed some tenderness to her left breast yesterday, not going away, missed her last mammogram due to Covid concerns.  Denies fever, no chest pain or shortness of breath, no abdominal pain.  Noted some discharge from the left nipple yesterday.  Review of Systems  A complete 10 system review of systems was obtained and all systems are negative except as noted in the HPI and PMH.   Patient's Health History    Past Medical History:  Diagnosis Date  . Adenocarcinoma of colon ([place]) 04/12/2011   "rectal-colon"  . Corneal ulcers and infections 07/04/12  . Degenerative joint disease    right knee  . DVT (deep vein thrombosis) in pregnancy 1979   during pregnancy. lower extremity  . Family history of colon cancer   . Family history of uterine cancer   . History of chemotherapy     cycle 5 Folfox completed 09/23/11  . History of radiation therapy 10/25/11 -12/01/11   pelvis/presacral region  . Morbid obesity ([place])   . OSA (obstructive sleep apnea) 07/04/2018   Severe per split night study 11/19  Trial of CPAP therapy on 12 cm H2O with a Wide size Resmed Nasal Mask AirFit N10 mask and heated humidification.  . Osteopenia   . 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 Stress incontinence, female    not current    Past Surgical History:  Procedure Laterality Date  . ABDOMINAL HYSTERECTOMY  3 years ago  . BREAST CYST EXCISION  1978  . CESAREAN SECTION    . COLON SURGERY     LAR, reoperation for necrotic ileostomy, transverse colostomy  . COLOSTOMY  05/27/11  . COLOSTOMY TAKEDOWN   07/11/2012   Procedure: COLOSTOMY TAKEDOWN;  Surgeon: Rolm Bookbinder, MD;  Location: [facility];  Service: General;  Laterality: N/A;  colostomy takedown  . JOINT REPLACEMENT  2002   hip replacement  . [place]  . MASS EXCISION  2011   "mass removed from ovaries"  . PORT-A-CATH REMOVAL  07/11/2012   Procedure: REMOVAL PORT-A-CATH;  Surgeon: Rolm Bookbinder, MD;  Location: [facility];  Service: General;  Laterality: N/A;  removal portacath  . PORTACATH PLACEMENT  07/26/11   right portacath  . TOTAL ABDOMINAL HYSTERECTOMY W/ BILATERAL SALPINGOOPHORECTOMY  11/30/09   BSO  . TOTAL HIP ARTHROPLASTY  2002   left hip    Family History  Problem Relation Age of Onset  . Uterine cancer Mother 51       deceased 79  . Diabetes Father   . Colon cancer Sister 22       currently 61  . Alcoholism Brother     Social History   Socioeconomic History  . Marital status: Widowed    Spouse name: Not on file  . Number of children: 1  . Years of education: Not on file  . Highest education level: Not on file  Occupational History  . 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 Financial resource strain: Not on file  . Food insecurity  Worry: Not on file    Inability: Not on file  . Transportation needs    Medical: Not on file    Non-medical: Not on file  Tobacco Use  . Smoking status: Never Smoker  . Smokeless tobacco: Never Used  Substance and Sexual Activity  . Alcohol use: Yes    Alcohol/week: 0.0 standard drinks    Comment: occ  . Drug use: No  . Sexual activity: Not on file  Lifestyle  . Physical activity    Days per week: Not on file    Minutes per session: Not on file  . Stress: Not on file  Relationships  . Social Herbalist on phone: Not on file    Gets together: Not on file    Attends religious service: Not on file    Active member of club or organization: Not on file    Attends meetings of clubs or organizations:  Not on file    Relationship status: Not on file  . Intimate partner violence    Fear of current or ex partner: Not on file    Emotionally abused: Not on file    Physically abused: Not on file    Forced sexual activity: Not on file  Other Topics Concern  . Not on file  Social History Narrative   Regular exercise:  No   Caffeine Use:  1 cup coffee daily   Husband passed 2/17   Daughter, son-in-law and her 2 children- they recently moved out   Completed the 12th grade, and some college.   Does not work.      Physical Exam  Vital Signs and Nursing Notes reviewed Vitals:   06/20/19 1244  BP: (!) 174/100  Pulse: 86  Resp: 20  Temp: 99.1 F (37.3 C)  SpO2: 97%    CONSTITUTIONAL: Well-appearing, NAD NEURO:  Alert and oriented x 3, no focal deficits EYES:  eyes equal and reactive ENT/NECK:  no LAD, no JVD CARDIO: Regular rate, well-perfused, normal S1 and S2 PULM:  CTAB no wheezing or rhonchi GI/GU:  normal bowel sounds, non-distended, non-tender MSK/SPINE:  No gross deformities, no edema SKIN:  no rash, atraumatic; estimated 4 cm nodule to the left breast at the 3 o'clock position, no overlying skin change PSYCH:  Appropriate speech and behavior  Diagnostic and Interventional Summary    EKG Interpretation  Date/Time:    Ventricular Rate:    PR Interval:    QRS Duration:   QT Interval:    QTC Calculation:   R Axis:     Text Interpretation:        Labs Reviewed - No data to display  US BREAST LTD UNI LEFT INC AXILLA    (Results Pending)    Medications - No data to display   Procedures  /  Critical Care Ultrasound ED Soft Tissue  Date/Time: 06/20/2019 2:46 PM Performed by: Maudie Flakes, MD Authorized by: Maudie Flakes, MD   Procedure details:    Indications: localization of abscess     Transverse view:  Not visualized   Longitudinal view:  Not visualized   Images: archived   Location:    Location: breast     Side:  Left Comments:     No evidence  of abscess, no evidence of cyst, no evidence of cellulitis.    ED Course and Medical Decision Making  I have reviewed the triage vital signs and the nursing notes.  Pertinent labs & imaging results that  were available during my care of the patient were reviewed by me and considered in my medical decision making (see below for details).     Ultrasound to evaluate for breast abscess versus mass versus cyst.  Unable to obtain formal ultrasound here at [facility].  My bedside ultrasound did not reveal any fluid collection or abscess, largely did not see any obvious tissue abnormalities.  Patient needs follow-up, referred to breast center and her PCP.  Stressed the importance of prompt follow-up for this condition.  Barth Kirks. Sedonia Small, [place] [email or web address]  Final Clinical Impressions(s) / ED Diagnoses     ICD-10-CM   1. Breast pain  N64.4 US BREAST LTD UNI LEFT INC AXILLA    US BREAST LTD UNI LEFT INC [place]    ED Discharge Orders    None       Discharge Instructions Discussed with and Provided to Patient:   Discharge Instructions   None       Maudie Flakes, MD 06/20/19 1447

## 2019-06-20 NOTE — Discharge Instructions (Addendum)
You were evaluated in the Emergency Department and after careful evaluation, we did not find any emergent condition requiring admission or further testing in the hospital.  Your exam/testing today is overall reassuring.  Please follow-up with your primary care doctor or the breast clinic provided to have a mammogram or repeat evaluation soon.  Please return to the Emergency Department if you experience any worsening of your condition.  We encourage you to follow up with a primary care provider.  Thank you for allowing [place] to be a part of your care.

## 2019-06-21 ENCOUNTER — Telehealth: Payer: Self-pay | Admitting: Family

## 2019-06-21 NOTE — Telephone Encounter (Signed)
One week ED FOLLOW UP called patient left msg

## 2019-06-21 NOTE — Telephone Encounter (Signed)
Please contact pt to arrange in person ED follow up with me in the next 1 week.

## 2019-06-22 NOTE — Telephone Encounter (Signed)
Pt has been scheduled.  °

## 2019-06-26 ENCOUNTER — Encounter: Payer: Self-pay | Admitting: Family

## 2019-06-26 ENCOUNTER — Ambulatory Visit (INDEPENDENT_AMBULATORY_CARE_PROVIDER_SITE_OTHER): Payer: PPO | Admitting: Family

## 2019-06-26 ENCOUNTER — Other Ambulatory Visit: Payer: Self-pay

## 2019-06-26 VITALS — BP 158/69 | HR 69 | Temp 98.3°F | Resp 16 | Ht 60.0 in | Wt 234.0 lb

## 2019-06-26 DIAGNOSIS — N6320 Unspecified lump in the left breast, unspecified quadrant: Secondary | ICD-10-CM

## 2019-06-26 DIAGNOSIS — R030 Elevated blood-pressure reading, without diagnosis of hypertension: Secondary | ICD-10-CM

## 2019-06-26 NOTE — Patient Instructions (Signed)
You should be contacted about scheduling your breast imaging at the Breast center. Let me know if they have not contacted you by Friday of this week to schedule

## 2019-06-26 NOTE — Progress Notes (Signed)
Subjective:    Patient ID: Jasmine Clayton, female    DOB: 04-06-1951, 68 y.o.   MRN: [id number]  HPI  Patient is a 68 yr old female who presents today for ED follow up.  She presented to the ED on 11/18 with chief complaint of left sided breast pain.  She apparently had some discharge from left nipple. She had a quick bedside breast US which did not show any obvious fluid collection or abscess.  Her last mammogram was 05/17/17.   Denies any discharge since returning home from the ED, however she still has some tenderness. Denies associated fever.      Review of Systems See HPI  Past Medical History:  Diagnosis Date  . Adenocarcinoma of colon ([place]) 04/12/2011   "rectal-colon"  . Corneal ulcers and infections 07/04/12  . Degenerative joint disease    right knee  . DVT (deep vein thrombosis) in pregnancy 1979   during pregnancy. lower extremity  . Family history of colon cancer   . Family history of uterine cancer   . History of chemotherapy     cycle 5 Folfox completed 09/23/11  . History of radiation therapy 10/25/11 -12/01/11   pelvis/presacral region  . Morbid obesity ([place])   . OSA (obstructive sleep apnea) 07/04/2018   Severe per split night study 11/19  Trial of CPAP therapy on 12 cm H2O with a Wide size Resmed Nasal Mask AirFit N10 mask and heated humidification.  . Osteopenia   . 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 Stress incontinence, female    not current     Social History   Socioeconomic History  . Marital status: Widowed    Spouse name: Not on file  . Number of children: 1  . Years of education: Not on file  . Highest education level: Not on file  Occupational History  . 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 Financial resource strain: Not on file  . Food insecurity    Worry: Not on file    Inability: Not on file  . Transportation needs   Medical: Not on file    Non-medical: Not on file  Tobacco Use  . Smoking status: Never Smoker  . Smokeless tobacco: Never Used  Substance and Sexual Activity  . Alcohol use: Yes    Alcohol/week: 0.0 standard drinks    Comment: occ  . Drug use: No  . Sexual activity: Not on file  Lifestyle  . Physical activity    Days per week: Not on file    Minutes per session: Not on file  . Stress: Not on file  Relationships  . Social Herbalist on phone: Not on file    Gets together: Not on file    Attends religious service: Not on file    Active member of club or organization: Not on file    Attends meetings of clubs or organizations: Not on file    Relationship status: Not on file  . Intimate partner violence    Fear of current or ex partner: Not on file    Emotionally abused: Not on file    Physically abused: Not on file    Forced sexual activity: Not on file  Other Topics Concern  . Not on file  Social History Narrative   Regular exercise:  No   Caffeine Use:  1 cup coffee  daily   Husband passed 2/17   Daughter, son-in-law and her 2 children- they recently moved out   Completed the 12th grade, and some college.   Does not work.     Past Surgical History:  Procedure Laterality Date  . ABDOMINAL HYSTERECTOMY  3 years ago  . BREAST CYST EXCISION  1978  . CESAREAN SECTION    . COLON SURGERY     LAR, reoperation for necrotic ileostomy, transverse colostomy  . COLOSTOMY  05/27/11  . COLOSTOMY TAKEDOWN  07/11/2012   Procedure: COLOSTOMY TAKEDOWN;  Surgeon: Rolm Bookbinder, MD;  Location: [facility];  Service: General;  Laterality: N/A;  colostomy takedown  . JOINT REPLACEMENT  2002   hip replacement  . [place]  . MASS EXCISION  2011   "mass removed from ovaries"  . PORT-A-CATH REMOVAL  07/11/2012   Procedure: REMOVAL PORT-A-CATH;  Surgeon: Rolm Bookbinder, MD;  Location: [facility];  Service: General;  Laterality: N/A;  removal portacath  . PORTACATH  PLACEMENT  07/26/11   right portacath  . TOTAL ABDOMINAL HYSTERECTOMY W/ BILATERAL SALPINGOOPHORECTOMY  11/30/09   BSO  . TOTAL HIP ARTHROPLASTY  2002   left hip    Family History  Problem Relation Age of Onset  . Uterine cancer Mother 78       deceased 63  . Diabetes Father   . Colon cancer Sister 29       currently 78  . Alcoholism Brother     No Known Allergies  Current Outpatient Medications on File Prior to Visit  Medication Sig Dispense Refill  . aspirin 81 MG tablet Take 81 mg by mouth daily.      . B Complex Vitamins (B-COMPLEX/B-12 PO) Take 1 tablet by mouth daily.     Marland Kitchen buPROPion (WELLBUTRIN XL) 300 MG 24 hr tablet Take 1 tab by mouth once daily 90 tablet 0  . Calcium Carb-Cholecalciferol (CALCIUM-VITAMIN D3) 600-500 MG-UNIT CAPS Take 1 tablet by mouth 2 (two) times daily. 60 capsule 0  . escitalopram (LEXAPRO) 20 MG tablet TAKE 1 TABLET(20 MG) BY MOUTH DAILY 90 tablet 1  . Omega-3 Fatty Acids (FISH OIL) 1200 MG CAPS Take 1 capsule by mouth daily.     Current Facility-Administered Medications on File Prior to Visit  Medication Dose Route Frequency Provider Last Rate Last Dose  . sodium chloride 0.9 % injection 10 mL  10 mL Intravenous PRN [name], MD   10 mL at 05/25/12 1153    BP (!) 158/69 (BP Location: Right Arm, Patient Position: Sitting, Cuff Size: Large)   Pulse 69   Temp 98.3 F (36.8 C) (Oral)   Resp 16   Ht 5' (1.524 m)   Wt 234 lb (106.1 kg)   LMP 11/30/2009   SpO2 100%   BMI 45.70 kg/m       Objective:   Physical Exam Constitutional:      Appearance: She is well-developed.  Neck:     Musculoskeletal: Neck supple.     Thyroid: No thyromegaly.  Cardiovascular:     Rate and Rhythm: Normal rate and regular rhythm.     Heart sounds: Normal heart sounds. No murmur.  Pulmonary:     Effort: Pulmonary effort is normal. No respiratory distress.     Breath sounds: Normal breath sounds. No wheezing.  Skin:    General: Skin is warm and dry.   Neurological:     Mental Status: She is alert and oriented to person, place,  and time.  Psychiatric:        Behavior: Behavior normal.        Thought Content: Thought content normal.        Judgment: Judgment normal.   Breast: firm, irregularly shaped mass noted left breast at 3 oclock, adjacent to areola.  Approximately 1 inch in diameter.        Assessment & Plan:   Breast mass- new.  Will send for diagnostic mammogram and ultrasound at the [facility].  Elevated blood pressure- she was quite anxious today. We will repeat on 12/22 at her follow up appointment.  BP Readings from Last 3 Encounters:  06/26/19 (!) 158/69  06/20/19 137/73  05/15/19 (!) 141/66   This visit occurred during the SARS-CoV-2 public health emergency.  Safety protocols were in place, including screening questions prior to the visit, additional usage of staff PPE, and extensive cleaning of exam room while observing appropriate contact time as indicated for disinfecting solutions.

## 2019-06-27 ENCOUNTER — Other Ambulatory Visit: Payer: Self-pay

## 2019-07-02 ENCOUNTER — Other Ambulatory Visit: Payer: Self-pay

## 2019-07-02 ENCOUNTER — Emergency Department (HOSPITAL_BASED_OUTPATIENT_CLINIC_OR_DEPARTMENT_OTHER)
Admission: EM | Admit: 2019-07-02 | Discharge: 2019-07-02 | Disposition: A | Discharge status: Home / Self Care | Payer: PPO | Attending: Emergency Medicine | Admitting: Emergency Medicine

## 2019-07-02 ENCOUNTER — Encounter (HOSPITAL_BASED_OUTPATIENT_CLINIC_OR_DEPARTMENT_OTHER): Payer: Self-pay | Admitting: *Deleted

## 2019-07-02 DIAGNOSIS — Y939 Activity, unspecified: Secondary | ICD-10-CM | POA: Insufficient documentation

## 2019-07-02 DIAGNOSIS — S81812A Laceration without foreign body, left lower leg, initial encounter: Secondary | ICD-10-CM | POA: Diagnosis not present

## 2019-07-02 DIAGNOSIS — Z23 Encounter for immunization: Secondary | ICD-10-CM | POA: Diagnosis not present

## 2019-07-02 DIAGNOSIS — Y929 Unspecified place or not applicable: Secondary | ICD-10-CM | POA: Insufficient documentation

## 2019-07-02 DIAGNOSIS — Z7982 Long term (current) use of aspirin: Secondary | ICD-10-CM | POA: Diagnosis not present

## 2019-07-02 DIAGNOSIS — Z86718 Personal history of other venous thrombosis and embolism: Secondary | ICD-10-CM | POA: Insufficient documentation

## 2019-07-02 DIAGNOSIS — Z85040 Personal history of malignant carcinoid tumor of rectum: Secondary | ICD-10-CM | POA: Insufficient documentation

## 2019-07-02 DIAGNOSIS — W25XXXA Contact with sharp glass, initial encounter: Secondary | ICD-10-CM | POA: Diagnosis not present

## 2019-07-02 DIAGNOSIS — S71112A Laceration without foreign body, left thigh, initial encounter: Secondary | ICD-10-CM | POA: Insufficient documentation

## 2019-07-02 DIAGNOSIS — Y999 Unspecified external cause status: Secondary | ICD-10-CM | POA: Diagnosis not present

## 2019-07-02 DIAGNOSIS — Z79899 Other long term (current) drug therapy: Secondary | ICD-10-CM | POA: Insufficient documentation

## 2019-07-02 MED ORDER — LIDOCAINE-EPINEPHRINE (PF) 2 %-1:200000 IJ SOLN
INTRAMUSCULAR | Status: AC
  Administered 2019-07-02: 10 mL
  Filled 2019-07-02: qty 10

## 2019-07-02 MED ORDER — BACITRACIN ZINC 500 UNIT/GM EX OINT
TOPICAL_OINTMENT | Freq: Two times a day (BID) | CUTANEOUS | Status: DC
  Administered 2019-07-02: 17:00:00 via TOPICAL

## 2019-07-02 MED ORDER — LIDOCAINE-EPINEPHRINE (PF) 2 %-1:200000 IJ SOLN
10.0000 mL | Freq: Once | INTRAMUSCULAR | Status: AC
  Administered 2019-07-02: 17:00:00 10 mL

## 2019-07-02 MED ORDER — LIDOCAINE-EPINEPHRINE 2 %-1:100000 IJ SOLN
20.0000 mL | Freq: Once | INTRAMUSCULAR | Status: DC
  Filled 2019-07-02: qty 20

## 2019-07-02 MED ORDER — TETANUS-DIPHTH-ACELL PERTUSSIS 5-2.5-18.5 LF-MCG/0.5 IM SUSP
0.5000 mL | Freq: Once | INTRAMUSCULAR | Status: AC
  Administered 2019-07-02: 0.5 mL via INTRAMUSCULAR
  Filled 2019-07-02: qty 0.5

## 2019-07-02 NOTE — ED Provider Notes (Signed)
[facility] Provider Note   CSN: [id number] Arrival date & time: 07/02/19  1225     History   Chief Complaint Chief Complaint  Patient presents with  . Laceration    HPI Jasmine Clayton is a 68 y.o. female.     HPI 68 year old African-American female presents the ER for evaluation of laceration to left thigh.  Granddaughter actually shattered a glass table causing her to sustain a puncture laceration to her left thigh.  Patient states that the bleeding has been controlled managing at home.  Denies any paresthesias or weakness.  Unsure of last tetanus shot.  Is taken no medications for symptoms prior to arrival.  No alleviating or aggravating factors.  Patient is able to ambulate. Past Medical History:  Diagnosis Date  . Adenocarcinoma of colon ([place]) 04/12/2011   "rectal-colon"  . Corneal ulcers and infections 07/04/12  . Degenerative joint disease    right knee  . DVT (deep vein thrombosis) in pregnancy 1979   during pregnancy. lower extremity  . Family history of colon cancer   . Family history of uterine cancer   . History of chemotherapy     cycle 5 Folfox completed 09/23/11  . History of radiation therapy 10/25/11 -12/01/11   pelvis/presacral region  . Morbid obesity ([place])   . OSA (obstructive sleep apnea) 07/04/2018   Severe per split night study 11/19  Trial of CPAP therapy on 12 cm H2O with a Wide size Resmed Nasal Mask AirFit N10 mask and heated humidification.  . Osteopenia   . 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 Stress incontinence, female    not current    Patient Active Problem List   Diagnosis Date Noted  . OSA (obstructive sleep apnea) 07/04/2018  . 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 Osteopenia   . Depression 08/25/2016  . Insomnia 11/28/2015  . Family history of uterine cancer   . Vaginal dryness, menopausal 12/12/2014   . Hyperglycemia 01/07/2014  . Osteoarthritis, knee 02/16/2013  . Routine general medical examination at a health care facility 03/27/2012  . Adenocarcinoma of colon ([place]) 04/12/2011  . Morbid obesity ([place]) 02/10/2011  . HTN (hypertension) 02/10/2011    Past Surgical History:  Procedure Laterality Date  . ABDOMINAL HYSTERECTOMY  3 years ago  . BREAST CYST EXCISION  1978  . CESAREAN SECTION    . COLON SURGERY     LAR, reoperation for necrotic ileostomy, transverse colostomy  . COLOSTOMY  05/27/11  . COLOSTOMY TAKEDOWN  07/11/2012   Procedure: COLOSTOMY TAKEDOWN;  Surgeon: Rolm Bookbinder, MD;  Location: [facility];  Service: General;  Laterality: N/A;  colostomy takedown  . JOINT REPLACEMENT  2002   hip replacement  . [place]  . MASS EXCISION  2011   "mass removed from ovaries"  . PORT-A-CATH REMOVAL  07/11/2012   Procedure: REMOVAL PORT-A-CATH;  Surgeon: Rolm Bookbinder, MD;  Location: [facility];  Service: General;  Laterality: N/A;  removal portacath  . PORTACATH PLACEMENT  07/26/11   right portacath  . TOTAL ABDOMINAL HYSTERECTOMY W/ BILATERAL SALPINGOOPHORECTOMY  11/30/09   BSO  . TOTAL HIP ARTHROPLASTY  2002   left hip     OB History   No obstetric history on file.      Home Medications    Prior to Admission medications   Medication Sig Start Date End Date Taking? Authorizing Provider  aspirin 81 MG tablet Take 81 mg by mouth daily.      [provider]  B Complex Vitamins (B-COMPLEX/B-12 PO) Take 1 tablet by mouth daily.     [provider]  buPROPion (WELLBUTRIN XL) 300 MG 24 hr tablet Take 1 tab by mouth once daily 05/10/19   Debbrah Alar, NP  Calcium Carb-Cholecalciferol (CALCIUM-VITAMIN D3) 600-500 MG-UNIT CAPS Take 1 tablet by mouth 2 (two) times daily. 09/02/16   Debbrah Alar, NP  escitalopram (LEXAPRO) 20 MG tablet TAKE 1 TABLET(20 MG) BY MOUTH DAILY 05/08/19   Debbrah Alar, NP  Omega-3 Fatty Acids (FISH OIL) 1200  MG CAPS Take 1 capsule by mouth daily.    [provider]    Family History Family History  Problem Relation Age of Onset  . Uterine cancer Mother 67       deceased 30  . Diabetes Father   . Colon cancer Sister 33       currently 26  . Alcoholism Brother     Social History Social History   Tobacco Use  . Smoking status: Never Smoker  . Smokeless tobacco: Never Used  Substance Use Topics  . Alcohol use: Yes    Alcohol/week: 0.0 standard drinks    Comment: occ  . Drug use: No     Allergies   Patient has no known allergies.   Review of Systems Review of Systems  Constitutional: Negative for chills and fever.  HENT: Negative for congestion.   Eyes: Negative for discharge.  Respiratory: Negative for cough.   Musculoskeletal: Positive for myalgias.  Skin: Positive for wound.  Neurological: Negative for weakness and numbness.  Psychiatric/Behavioral: Negative for confusion.     Physical Exam Updated Vital Signs BP (!) 157/86   Pulse 82   Temp 97.9 F (36.6 C) (Oral)   Resp 16   Ht 5' (1.524 m)   Wt 105.7 kg   LMP 11/30/2009   SpO2 97%   BMI 45.50 kg/m   Physical Exam Vitals signs and nursing note reviewed.  Constitutional:      General: She is not in acute distress.    Appearance: She is well-developed. She is not ill-appearing or toxic-appearing.  HENT:     Head: Normocephalic and atraumatic.  Eyes:     General: No scleral icterus.       Right eye: No discharge.        Left eye: No discharge.  Neck:     Musculoskeletal: Normal range of motion.  Cardiovascular:     Pulses: Normal pulses.  Pulmonary:     Effort: No respiratory distress.  Musculoskeletal: Normal range of motion.     Comments: Patient with full range of motion of all joints the left lower leg.  Patient with good strength with flexion extension of the left hip, left knee and left foot.  Skin:    General: Skin is warm and dry.     Capillary Refill: Capillary refill takes  less than 2 seconds.     Coloration: Skin is not pale.     Comments: Patient with approximately 0.5 cm laceration to the left inner thigh.  Bleeding controlled.  Adipose tissue noted.  No obvious foreign body or debris noted.  Neurological:     Mental Status: She is alert.  Psychiatric:        Behavior: Behavior normal.        Thought Content: Thought content normal.        Judgment: Judgment normal.  ED Treatments / Results  Labs (all labs ordered are listed, but only abnormal results are displayed) Labs Reviewed - No data to display  EKG None  Radiology No results found.  Procedures .Marland KitchenLaceration Repair  Date/Time: 07/02/2019 3:55 PM Performed by: Doristine Devoid, PA-C Authorized by: Doristine Devoid, PA-C   Consent:    Consent obtained:  Verbal   Consent given by:  Patient   Risks discussed:  Infection, pain, retained foreign body, tendon damage, vascular damage, poor wound healing, poor cosmetic result, need for additional repair and nerve damage   Alternatives discussed:  No treatment Anesthesia (see MAR for exact dosages):    Anesthesia method:  Local infiltration   Local anesthetic:  Lidocaine 1% WITH epi Laceration details:    Location:  Leg   Leg location:  L upper leg   Length (cm):  0.5 Repair type:    Repair type:  Simple Pre-procedure details:    Preparation:  Patient was prepped and draped in usual sterile fashion Exploration:    Hemostasis achieved with:  Direct pressure   Wound exploration: wound explored through full range of motion and entire depth of wound probed and visualized     Contaminated: no   Treatment:    Area cleansed with:  Betadine and saline   Amount of cleaning:  Standard   Irrigation solution:  Sterile saline   Irrigation volume:  100   Irrigation method:  Pressure wash Skin repair:    Repair method:  Sutures   Suture size:  3-0   Suture material:  Prolene   Number of sutures:  3 Approximation:     Approximation:  Close Post-procedure details:    Dressing:  Antibiotic ointment   Patient tolerance of procedure:  Tolerated well, no immediate complications   (including critical care time)  Medications Ordered in ED Medications  lidocaine-EPINEPHrine (XYLOCAINE W/EPI) 2 %-1:100000 (with pres) injection 20 mL (has no administration in time range)  Tdap (BOOSTRIX) injection 0.5 mL (has no administration in time range)  bacitracin ointment (has no administration in time range)     Initial Impression / Assessment and Plan / ED Course  I have reviewed the triage vital signs and the nursing notes.  Pertinent labs & imaging results that were available during my care of the patient were reviewed by me and considered in my medical decision making (see chart for details).        Tdap booster given.Pressure irrigation performed. Laceration occurred < 8 hours prior to repair which was well tolerated. Pt has no co morbidities to effect normal wound healing. Discussed suture home care w pt and answered questions. Pt to f-u for wound check and suture removal in 7 days. Pt is hemodynamically stable w no complaints prior to dc.     Final Clinical Impressions(s) / ED Diagnoses   Final diagnoses:  Laceration of left lower extremity, initial encounter    ED Discharge Orders    None       Aaron Edelman 07/02/19 1701    Long, Wonda Old[facility], MD 07/03/19 0830

## 2019-07-02 NOTE — ED Notes (Signed)
ED Provider at bedside. 

## 2019-07-02 NOTE — Discharge Instructions (Signed)

## 2019-07-02 NOTE — ED Triage Notes (Signed)
Laceration to her left upper leg when a glass table broke and a piece of the glass hit her in the leg. 1/2" laceration noted. Bleeding controlled.

## 2019-07-06 NOTE — Progress Notes (Signed)
Subjective:   Jasmine [facility] is a 68 y.o. female who presents for Medicare Annual (Subsequent) preventive examination.  Review of Systems:  Home Safety/Smoke Alarms: Feels safe in home. Smoke alarms in place.  Lives alone in 1 story home.    Female:       Mammo- 07/09/19      Dexa scan-  08/30/16. Ordered today.     CCS- 07/21/17    Objective:     Vitals: BP (!) 150/90 (BP Location: Left Arm, Cuff Size: Large)   Pulse 78   Temp (!) 97 F (36.1 C) (Temporal)   Ht 5' (1.524 m)   Wt 238 lb 6.4 oz (108.1 kg)   LMP 11/30/2009   SpO2 97%   BMI 46.56 kg/m   Body mass index is 46.56 kg/m.  Advanced Directives 07/09/2019 07/02/2019 06/20/2019 07/06/2018 06/26/2018 05/10/2017 05/17/2016  Does Patient Have a Medical Advance Directive? No No No No No Yes No  Type of Advance Directive - - - - - - -  Does patient want to make changes to medical advance directive? - - - - - No - Patient declined -  Copy of [place] in [place]  Would patient like information on creating a medical advance directive? No - Patient declined - - No - Patient declined No - Patient declined - No - patient declined information  Pre-existing out of facility DNR order (yellow form or pink MOST form) - - - - - - -    Tobacco Social History   Tobacco Use  Smoking Status Never Smoker  Smokeless Tobacco Never Used     Counseling given: Not Answered   Clinical Intake: Pain : No/denies pain     Past Medical History:  Diagnosis Date  . Adenocarcinoma of colon ([place]) 04/12/2011   "rectal-colon"  . Corneal ulcers and infections 07/04/12  . Degenerative joint disease    right knee  . DVT (deep vein thrombosis) in pregnancy 1979   during pregnancy. lower extremity  . Family history of colon cancer   . Family history of uterine cancer   . History of chemotherapy     cycle 5 Folfox completed 09/23/11  . History of radiation therapy 10/25/11 -12/01/11   pelvis/presacral region   . Morbid obesity ([place])   . OSA (obstructive sleep apnea) 07/04/2018   Severe per split night study 11/19  Trial of CPAP therapy on 12 cm H2O with a Wide size Resmed Nasal Mask AirFit N10 mask and heated humidification.  . Osteopenia   . 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 Stress incontinence, female    not current   Past Surgical History:  Procedure Laterality Date  . ABDOMINAL HYSTERECTOMY  3 years ago  . BREAST CYST EXCISION  1978  . CESAREAN SECTION    . COLON SURGERY     LAR, reoperation for necrotic ileostomy, transverse colostomy  . COLOSTOMY  05/27/11  . COLOSTOMY TAKEDOWN  07/11/2012   Procedure: COLOSTOMY TAKEDOWN;  Surgeon: Rolm Bookbinder, MD;  Location: [facility];  Service: General;  Laterality: N/A;  colostomy takedown  . JOINT REPLACEMENT  2002   hip replacement  . [place]  . MASS EXCISION  2011   "mass removed from ovaries"  . PORT-A-CATH REMOVAL  07/11/2012   Procedure: REMOVAL PORT-A-CATH;  Surgeon: Rolm Bookbinder, MD;  Location: [facility];  Service: General;  Laterality: N/A;  removal portacath  . PORTACATH PLACEMENT  07/26/11   right portacath  . TOTAL ABDOMINAL HYSTERECTOMY W/ BILATERAL SALPINGOOPHORECTOMY  11/30/09   BSO  . TOTAL HIP ARTHROPLASTY  2002   left hip   Family History  Problem Relation Age of Onset  . Uterine cancer Mother 34       deceased 27  . Diabetes Father   . Colon cancer Sister 5       currently 25  . Alcoholism Brother    Social History   Socioeconomic History  . Marital status: Widowed    Spouse name: Not on file  . Number of children: 1  . Years of education: Not on file  . Highest education level: Not on file  Occupational History  . 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 Financial resource strain: Not on file  . Food insecurity    Worry: Not on file    Inability: Not on file  . Transportation needs     Medical: Not on file    Non-medical: Not on file  Tobacco Use  . Smoking status: Never Smoker  . Smokeless tobacco: Never Used  Substance and Sexual Activity  . Alcohol use: Yes    Alcohol/week: 0.0 standard drinks    Comment: occ  . Drug use: No  . Sexual activity: Not on file  Lifestyle  . Physical activity    Days per week: Not on file    Minutes per session: Not on file  . Stress: Not on file  Relationships  . Social Herbalist on phone: Not on file    Gets together: Not on file    Attends religious service: Not on file    Active member of club or organization: Not on file    Attends meetings of clubs or organizations: Not on file    Relationship status: Not on file  Other Topics Concern  . Not on file  Social History Narrative   Regular exercise:  No   Caffeine Use:  1 cup coffee daily   Husband passed 2/17   Daughter, son-in-law and her 2 children- they recently moved out   Completed the 12th grade, and some college.   Does not work.     Outpatient Encounter Medications as of 07/09/2019  Medication Sig  . aspirin 81 MG tablet Take 81 mg by mouth daily.    . B Complex Vitamins (B-COMPLEX/B-12 PO) Take 1 tablet by mouth daily.   Marland Kitchen buPROPion (WELLBUTRIN XL) 300 MG 24 hr tablet Take 1 tab by mouth once daily  . Calcium Carb-Cholecalciferol (CALCIUM-VITAMIN D3) 600-500 MG-UNIT CAPS Take 1 tablet by mouth 2 (two) times daily.  Marland Kitchen escitalopram (LEXAPRO) 20 MG tablet TAKE 1 TABLET(20 MG) BY MOUTH DAILY  . Omega-3 Fatty Acids (FISH OIL) 1200 MG CAPS Take 1 capsule by mouth daily.   Facility-Administered Encounter Medications as of 07/09/2019  Medication  . sodium chloride 0.9 % injection 10 mL    Activities of Daily Living In your present state of health, do you have any difficulty performing the following activities: 07/09/2019  Hearing? N  Vision? N  Difficulty concentrating or making decisions? N  Walking or climbing stairs? N  Dressing or bathing? N   Doing errands, shopping? N  Preparing Food and eating ? N  Using the Toilet? N  In the past six months, have you accidently leaked urine? N  Do you  have problems with loss of bowel control? N  Managing your Medications? N  Managing your Finances? N  Housekeeping or managing your Housekeeping? N  Some recent data might be hidden    Patient Care Team: Debbrah Alar, NP as PCP - General (Internal Medicine) Rolm Bookbinder, MD (General Surgery) Janie Morning, MD (Obstetrics and Gynecology)    Assessment:   This is a routine wellness examination for [facility]. Physical assessment deferred to PCP.  Exercise Activities and Dietary recommendations Current Exercise Habits: The patient does not participate in regular exercise at present, Exercise limited by: None identified Diet (meal preparation, eat out, water intake, caffeinated beverages, dairy products, fruits and vegetables): 24 hr recall Breakfast: coffee Lunch: left over Jap[place]ese Dinner:  Meatballs and atichoke dip, pasta bread, salad  Goals    . DIET - INCREASE WATER INTAKE    . Patient Stated     Patient would like to have the energy and focus to declutter her home.       Fall Risk Fall Risk  07/09/2019 07/06/2018 01/09/2018 09/04/2015 04/05/2014  Falls in the past year? 0 0 No No No  Risk for fall due to : - - - History of fall(s);Medication side effect -  Follow up Education provided;Falls prevention discussed - - - -     Depression Screen PHQ 2/9 Scores 05/15/2019 10/30/2018 07/31/2018 07/06/2018  PHQ - 2 Score 1 1 3 4   PHQ- 9 Score 5 5 10 22      Cognitive Function Ad8 score reviewed for issues:  Issues making decisions:no  Less interest in hobbies / activities:no  Repeats questions, stories (family complaining):no  Trouble using ordinary gadgets (microwave, computer, phone):no  Forgets the month or year: no  Mismanaging finances: no  Remembering appts:no  Daily problems with thinking and/or  memory:no Ad8 score is=0         Immunization History  Administered Date(s) Administered  . Fluad Quad(high Dose 65+) 05/15/2019  . Influenza Split 08/28/2011  . Influenza, High Dose Seasonal PF 05/04/2017, 04/11/2018  . Influenza,inj,Quad PF,6+ Mos 06/04/2013, 04/05/2014, 05/26/2015, 04/23/2016  . Pneumococcal Conjugate-13 01/09/2018  . Pneumococcal Polysaccharide-23 07/17/2014  . Tdap 03/15/2011, 07/02/2019  . Zoster 02/16/2013    Screening Tests Health Maintenance  Topic Date Due  . Hepatitis C Screening  Nov 02, 1950  . MAMMOGRAM  05/18/2019  . PNA vac Low Risk Adult (2 of 2 - PPSV23) 07/18/2019  . COLONOSCOPY  07/21/2020  . TETANUS/TDAP  07/01/2029  . INFLUENZA VACCINE  Completed  . DEXA SCAN  Completed        Plan:   See you next year!  Continue to eat heart healthy diet (full of fruits, vegetables, whole grains, lean protein, water--limit salt, fat, and sugar intake) and increase physical activity as tolerated.  Continue doing brain stimulating activities (puzzles, reading, adult coloring books, staying active) to keep memory sharp.   I have ordered you bone density scan.  I have personally reviewed and noted the following in the patient's chart:   . Medical and social history . Use of alcohol, tobacco or illicit drugs  . Current medications and supplements . Functional ability and status . Nutritional status . Physical activity . Advanced directives . List of other physicians . Hospitalizations, surgeries, and ER visits in previous 12 months . Vitals . Screenings to include cognitive, depression, and falls . Referrals and appointments  In addition, I have reviewed and discussed with patient certain preventive protocols, quality metrics, and best practice recommendations. A written personalized care  plan for preventive services as well as general preventive health recommendations were provided to patient.     Shela Nevin, [place]  07/09/2019

## 2019-07-09 ENCOUNTER — Ambulatory Visit
Admission: RE | Admit: 2019-07-09 | Discharge: 2019-07-09 | Disposition: A | Discharge status: Home / Self Care | Payer: PPO | Source: Ambulatory Visit | Attending: Family | Admitting: Family

## 2019-07-09 ENCOUNTER — Other Ambulatory Visit: Payer: Self-pay

## 2019-07-09 ENCOUNTER — Ambulatory Visit (INDEPENDENT_AMBULATORY_CARE_PROVIDER_SITE_OTHER): Payer: PPO | Admitting: *Deleted

## 2019-07-09 ENCOUNTER — Telehealth: Payer: Self-pay

## 2019-07-09 ENCOUNTER — Encounter: Payer: Self-pay | Admitting: *Deleted

## 2019-07-09 VITALS — BP 150/90 | HR 78 | Temp 97.0°F | Ht 60.0 in | Wt 238.4 lb

## 2019-07-09 DIAGNOSIS — N6320 Unspecified lump in the left breast, unspecified quadrant: Secondary | ICD-10-CM

## 2019-07-09 DIAGNOSIS — Z780 Asymptomatic menopausal state: Secondary | ICD-10-CM

## 2019-07-09 DIAGNOSIS — Z0000 Encounter for general adult medical examination without abnormal findings: Secondary | ICD-10-CM

## 2019-07-09 DIAGNOSIS — N6489 Other specified disorders of breast: Secondary | ICD-10-CM | POA: Diagnosis not present

## 2019-07-09 DIAGNOSIS — R928 Other abnormal and inconclusive findings on diagnostic imaging of breast: Secondary | ICD-10-CM | POA: Diagnosis not present

## 2019-07-09 NOTE — Patient Instructions (Signed)
See you next year!  Continue to eat heart healthy diet (full of fruits, vegetables, whole grains, lean protein, water--limit salt, fat, and sugar intake) and increase physical activity as tolerated.  Continue doing brain stimulating activities (puzzles, reading, adult coloring books, staying active) to keep memory sharp.   I have ordered you bone density scan.   Ms. Jasmine Clayton , Thank you for taking time to come for your Medicare Wellness Visit. I appreciate your ongoing commitment to your health goals. Please review the following plan we discussed and let me know if I can assist you in the future.   These are the goals we discussed: Goals    . DIET - INCREASE WATER INTAKE    . Patient Stated     Patient would like to have the energy and focus to declutter her home.       This is a list of the screening recommended for you and due dates:  Health Maintenance  Topic Date Due  .  Hepatitis C: One time screening is recommended by Center for Disease Control  (CDC) for  adults born from [birth year] through 1964.   [birth year]  . Mammogram  05/18/2019  . Pneumonia vaccines (2 of 2 - PPSV23) 07/18/2019  . Colon Cancer Screening  07/21/2020  . Tetanus Vaccine  07/01/2029  . Flu Shot  Completed  . DEXA scan (bone density measurement)  Completed    DASH Eating Plan DASH stands for "Dietary Approaches to Stop Hypertension." The DASH eating plan is a healthy eating plan that has been shown to reduce high blood pressure (hypertension). It may also reduce your risk for type 2 diabetes, heart disease, and stroke. The DASH eating plan may also help with weight loss. What are tips for following this plan?  General guidelines  Avoid eating more than 2,300 mg (milligrams) of salt (sodium) a day. If you have hypertension, you may need to reduce your sodium intake to 1,500 mg a day.  Limit alcohol intake to no more than 1 drink a day for nonpregnant women and 2 drinks a day for men. One drink equals 12 oz  of beer, 5 oz of wine, or 1 oz of hard liquor.  Work with your health care provider to maintain a healthy body weight or to lose weight. Ask what an ideal weight is for you.  Get at least 30 minutes of exercise that causes your heart to beat faster (aerobic exercise) most days of the week. Activities may include walking, swimming, or biking.  Work with your health care provider or diet and nutrition specialist (dietitian) to adjust your eating plan to your individual calorie needs. Reading food labels   Check food labels for the amount of sodium per serving. Choose foods with less than 5 percent of the Daily Value of sodium. Generally, foods with less than 300 mg of sodium per serving fit into this eating plan.  To find whole grains, look for the word "whole" as the first word in the ingredient list. Shopping  Buy products labeled as "low-sodium" or "no salt added."  Buy fresh foods. Avoid canned foods and premade or frozen meals. Cooking  Avoid adding salt when cooking. Use salt-free seasonings or herbs instead of table salt or sea salt. Check with your health care provider or pharmacist before using salt substitutes.  Do not fry foods. Cook foods using healthy methods such as baking, boiling, grilling, and broiling instead.  Cook with heart-healthy oils, such as olive, canola, soybean, or sunflower  oil. Meal planning  Eat a balanced diet that includes: ? 5 or more servings of fruits and vegetables each day. At each meal, try to fill half of your plate with fruits and vegetables. ? Up to 6-8 servings of whole grains each day. ? Less than 6 oz of lean meat, poultry, or fish each day. A 3-oz serving of meat is about the same size as a deck of cards. One egg equals 1 oz. ? 2 servings of low-fat dairy each day. ? A serving of nuts, seeds, or beans 5 times each week. ? Heart-healthy fats. Healthy fats called Omega-3 fatty acids are found in foods such as flaxseeds and coldwater fish,  like sardines, salmon, and mackerel.  Limit how much you eat of the following: ? Canned or prepackaged foods. ? Food that is high in trans fat, such as fried foods. ? Food that is high in saturated fat, such as fatty meat. ? Sweets, desserts, sugary drinks, and other foods with added sugar. ? Full-fat dairy products.  Do not salt foods before eating.  Try to eat at least 2 vegetarian meals each week.  Eat more home-cooked food and less restaurant, buffet, and fast food.  When eating at a restaurant, ask that your food be prepared with less salt or no salt, if possible. What foods are recommended? The items listed may not be a complete list. Talk with your dietitian about what dietary choices are best for you. Grains Whole-grain or whole-wheat bread. Whole-grain or whole-wheat pasta. Brown rice. [place]. Bulgur. Whole-grain and low-sodium cereals. Pita bread. Low-fat, low-sodium crackers. Whole-wheat flour tortillas. Vegetables Fresh or frozen vegetables (raw, steamed, roasted, or grilled). Low-sodium or reduced-sodium tomato and vegetable juice. Low-sodium or reduced-sodium tomato sauce and tomato paste. Low-sodium or reduced-sodium canned vegetables. Fruits All fresh, dried, or frozen fruit. Canned fruit in natural juice (without added sugar). Meat and other protein foods Skinless chicken or [place]. Ground chicken or [place]. Pork with fat trimmed off. Fish and seafood. Egg whites. Dried beans, peas, or lentils. Unsalted nuts, nut butters, and seeds. Unsalted canned beans. Lean cuts of beef with fat trimmed off. Low-sodium, lean deli meat. Dairy Low-fat (1%) or fat-free (skim) milk. Fat-free, low-fat, or reduced-fat cheeses. Nonfat, low-sodium ricotta or cottage cheese. Low-fat or nonfat yogurt. Low-fat, low-sodium cheese. Fats and oils Soft margarine without trans fats. Vegetable oil. Low-fat, reduced-fat, or light mayonnaise and salad dressings (reduced-sodium). Canola,  safflower, olive, soybean, and sunflower oils. Avocado. Seasoning and other foods Herbs. Spices. Seasoning mixes without salt. Unsalted popcorn and pretzels. Fat-free sweets. What foods are not recommended? The items listed may not be a complete list. Talk with your dietitian about what dietary choices are best for you. Grains Baked goods made with fat, such as croissants, muffins, or some breads. Dry pasta or rice meal packs. Vegetables Creamed or fried vegetables. Vegetables in a cheese sauce. Regular canned vegetables (not low-sodium or reduced-sodium). Regular canned tomato sauce and paste (not low-sodium or reduced-sodium). Regular tomato and vegetable juice (not low-sodium or reduced-sodium). Angie Fava. Olives. Fruits Canned fruit in a light or heavy syrup. Fried fruit. Fruit in cream or butter sauce. Meat and other protein foods Fatty cuts of meat. Ribs. Fried meat. Berniece Salines. Sausage. Bologna and other processed lunch meats. Salami. Fatback. Hotdogs. Bratwurst. Salted nuts and seeds. Canned beans with added salt. Canned or smoked fish. Whole eggs or egg yolks. Chicken or [place] with skin. Dairy Whole or 2% milk, cream, and half-and-half. Whole or full-fat cream cheese.  Whole-fat or sweetened yogurt. Full-fat cheese. Nondairy creamers. Whipped toppings. Processed cheese and cheese spreads. Fats and oils Butter. Stick margarine. Lard. Shortening. Ghee. Bacon fat. Tropical oils, such as coconut, palm kernel, or palm oil. Seasoning and other foods Salted popcorn and pretzels. Onion salt, garlic salt, seasoned salt, table salt, and sea salt. Worcestershire sauce. Tartar sauce. Barbecue sauce. Teriyaki sauce. Soy sauce, including reduced-sodium. Steak sauce. Canned and packaged gravies. Fish sauce. Oyster sauce. Cocktail sauce. Horseradish that you find on the shelf. Ketchup. Mustard. Meat flavorings and tenderizers. Bouillon cubes. Hot sauce and Tabasco sauce. Premade or packaged marinades. Premade or  packaged taco seasonings. Relishes. Regular salad dressings. Where to find more information:  National Heart, Lung, and [place]: https://wilson-eaton.com/  American Heart Association: www.heart.org Summary  The DASH eating plan is a healthy eating plan that has been shown to reduce high blood pressure (hypertension). It may also reduce your risk for type 2 diabetes, heart disease, and stroke.  With the DASH eating plan, you should limit salt (sodium) intake to 2,300 mg a day. If you have hypertension, you may need to reduce your sodium intake to 1,500 mg a day.  When on the DASH eating plan, aim to eat more fresh fruits and vegetables, whole grains, lean proteins, low-fat dairy, and heart-healthy fats.  Work with your health care provider or diet and nutrition specialist (dietitian) to adjust your eating plan to your individual calorie needs. This information is not intended to replace advice given to you by your health care provider. Make sure you discuss any questions you have with your health care provider. Document Released: 07/08/2011 Document Revised: 07/01/2017 Document Reviewed: 07/12/2016 Elsevier Patient Education  2020 [place] 65 Years and Older, Female Preventive care refers to lifestyle choices and visits with your health care provider that can promote health and wellness. This includes:  A yearly physical exam. This is also called an annual well check.  Regular dental and eye exams.  Immunizations.  Screening for certain conditions.  Healthy lifestyle choices, such as diet and exercise. What can I expect for my preventive care visit? Physical exam Your health care provider will check:  Height and weight. These may be used to calculate body mass index (BMI), which is a measurement that tells if you are at a healthy weight.  Heart rate and blood pressure.  Your skin for abnormal spots. Counseling Your health care provider may ask you  questions about:  Alcohol, tobacco, and drug use.  Emotional well-being.  Home and relationship well-being.  Sexual activity.  Eating habits.  History of falls.  Memory and ability to understand (cognition).  Work and work Statistician.  Pregnancy and menstrual history. What immunizations do I need?  Influenza (flu) vaccine  This is recommended every year. Tetanus, diphtheria, and pertussis (Tdap) vaccine  You may need a Td booster every 10 years. Varicella (chickenpox) vaccine  You may need this vaccine if you have not already been vaccinated. Zoster (shingles) vaccine  You may need this after age 67. Pneumococcal conjugate (PCV13) vaccine  One dose is recommended after age 69. Pneumococcal polysaccharide (PPSV23) vaccine  One dose is recommended after age 62. Measles, mumps, and rubella (MMR) vaccine  You may need at least one dose of MMR if you were born in 1957 or later. You may also need a second dose. Meningococcal conjugate (MenACWY) vaccine  You may need this if you have certain conditions. Hepatitis A vaccine  You may need this if  you have certain conditions or if you travel or work in places where you may be exposed to hepatitis A. Hepatitis B vaccine  You may need this if you have certain conditions or if you travel or work in places where you may be exposed to hepatitis B. Haemophilus influenzae type b (Hib) vaccine  You may need this if you have certain conditions. You may receive vaccines as individual doses or as more than one vaccine together in one shot (combination vaccines). Talk with your health care provider about the risks and benefits of combination vaccines. What tests do I need? Blood tests  Lipid and cholesterol levels. These may be checked every 5 years, or more frequently depending on your overall health.  Hepatitis C test.  Hepatitis B test. Screening  Lung cancer screening. You may have this screening every year starting at  age 63 if you have a 30-pack-year history of smoking and currently smoke or have quit within the past 15 years.  Colorectal cancer screening. All adults should have this screening starting at age 73 and continuing until age 80. Your health care provider may recommend screening at age 32 if you are at increased risk. You will have tests every 1-10 years, depending on your results and the type of screening test.  Diabetes screening. This is done by checking your blood sugar (glucose) after you have not eaten for a while (fasting). You may have this done every 1-3 years.  Mammogram. This may be done every 1-2 years. Talk with your health care provider about how often you should have regular mammograms.  BRCA-related cancer screening. This may be done if you have a family history of breast, ovarian, tubal, or peritoneal cancers. Other tests  Sexually transmitted disease (STD) testing.  Bone density scan. This is done to screen for osteoporosis. You may have this done starting at age 69. Follow these instructions at home: Eating and drinking  Eat a diet that includes fresh fruits and vegetables, whole grains, lean protein, and low-fat dairy products. Limit your intake of foods with high amounts of sugar, saturated fats, and salt.  Take vitamin and mineral supplements as recommended by your health care provider.  Do not drink alcohol if your health care provider tells you not to drink.  If you drink alcohol: ? Limit how much you have to 0-1 drink a day. ? Be aware of how much alcohol is in your drink. In the [place] one drink equals one 12 oz bottle of beer (355 mL), one 5 oz glass of wine (148 mL), or one 1 oz glass of hard liquor (44 mL). Lifestyle  Take daily care of your teeth and gums.  Stay active. Exercise for at least 30 minutes on 5 or more days each week.  Do not use any products that contain nicotine or tobacco, such as cigarettes, e-cigarettes, and chewing tobacco. If you need  help quitting, ask your health care provider.  If you are sexually active, practice safe sex. Use a condom or other form of protection in order to prevent STIs (sexually transmitted infections).  Talk with your health care provider about taking a low-dose aspirin or statin. What's next?  Go to your health care provider once a year for a well check visit.  Ask your health care provider how often you should have your eyes and teeth checked.  Stay up to date on all vaccines. This information is not intended to replace advice given to you by your health care provider.  Make sure you discuss any questions you have with your health care provider. Document Released: 08/15/2015 Document Revised: 07/13/2018 Document Reviewed: 07/13/2018 Elsevier Patient Education  2020 [employer].

## 2019-07-09 NOTE — Telephone Encounter (Signed)
Copied from [place] [id number]. Topic: General - Call Back - No Documentation >> Jul 06, 2019  1:49 PM Reyne Dumas L wrote: Reason for CRM:  Pt states that she just missed a call from the office, no message left.

## 2019-07-10 ENCOUNTER — Other Ambulatory Visit: Payer: Self-pay | Admitting: Medical

## 2019-07-10 ENCOUNTER — Encounter: Payer: Self-pay | Admitting: Medical

## 2019-07-10 ENCOUNTER — Ambulatory Visit: Payer: Self-pay | Admitting: Family

## 2019-07-10 ENCOUNTER — Other Ambulatory Visit: Payer: Self-pay

## 2019-07-10 ENCOUNTER — Ambulatory Visit (INDEPENDENT_AMBULATORY_CARE_PROVIDER_SITE_OTHER): Payer: PPO | Admitting: Medical

## 2019-07-10 VITALS — BP 178/79 | HR 87 | Temp 97.0°F | Resp 12 | Wt 239.2 lb

## 2019-07-10 DIAGNOSIS — S71112A Laceration without foreign body, left thigh, initial encounter: Secondary | ICD-10-CM

## 2019-07-10 DIAGNOSIS — L089 Local infection of the skin and subcutaneous tissue, unspecified: Secondary | ICD-10-CM | POA: Diagnosis not present

## 2019-07-10 MED ORDER — DOXYCYCLINE HYCLATE 100 MG PO TABS: 100.0000 mg | ORAL_TABLET | Freq: Two times a day (BID) | ORAL | 0 refills | Status: DC

## 2019-07-10 MED ORDER — LOSARTAN POTASSIUM 25 MG PO TABS: 25.0000 mg | ORAL_TABLET | Freq: Every day | ORAL | 0 refills | Status: DC

## 2019-07-10 MED ORDER — HYDROCHLOROTHIAZIDE 12.5 MG PO TABS: 12.5000 mg | ORAL_TABLET | Freq: Every day | ORAL | 0 refills | Status: DC

## 2019-07-10 NOTE — Patient Instructions (Signed)
You appear to have wound infection of laceration area. I think this is delayed healing process. Better to start antibiotic and remove sutures on Friday. Rx advisement given on antibiotic.  For high bp I want you to start losartan 25 mg daily(low dose) and hctz 12.5 mg daily. Recheck bp on Friday as well.  Follow up Friday for suture removal and bp check.

## 2019-07-10 NOTE — Progress Notes (Signed)
Subjective:    Patient ID: Jasmine Clayton, female    DOB: April 25, 1951, 68 y.o.   MRN: [id number]  HPI  Left thigh laceration. Had suture placed on 06-05-2019. No fever, no dc or warmth. Wound is little tender per pt.  Pt had tdap in ED.  ED note portion. 68 year old African-American female presents the ER for evaluation of laceration to left thigh.  Granddaughter actually shattered a glass table causing her to sustain a puncture laceration to her left thigh.  Patient states that the bleeding has been controlled managing at home.  Denies any paresthesias or weakness.  Unsure of last tetanus shot.  Is taken no medications for symptoms prior to arrival.  No alleviating or aggravating factors.  Patient is able to ambulate.  Bp elevated. No cardiac or neurologic signs or symptoms. In past bp high and she states was on med then bp decreased and she was taken off med.      Review of Systems  Constitutional: Negative for chills, fatigue and fever.  Respiratory: Negative for cough, choking and wheezing.   Cardiovascular: Negative for chest pain and palpitations.  Musculoskeletal: Negative for back pain.  Skin:       Tender wound post laceration repair.  Hematological: Negative for adenopathy. Does not bruise/bleed easily.  Psychiatric/Behavioral: Negative for behavioral problems and confusion.    Past Medical History:  Diagnosis Date  . Adenocarcinoma of colon ([place]) 04/12/2011   "rectal-colon"  . Corneal ulcers and infections 07/04/12  . Degenerative joint disease    right knee  . DVT (deep vein thrombosis) in pregnancy 1979   during pregnancy. lower extremity  . Family history of colon cancer   . Family history of uterine cancer   . History of chemotherapy     cycle 5 Folfox completed 09/23/11  . History of radiation therapy 10/25/11 -12/01/11   pelvis/presacral region  . Morbid obesity ([place])   . OSA (obstructive sleep apnea) 07/04/2018   Severe per split night study 11/19  Trial  of CPAP therapy on 12 cm H2O with a Wide size Resmed Nasal Mask AirFit N10 mask and heated humidification.  . Osteopenia   . 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 Stress incontinence, female    not current     Social History   Socioeconomic History  . Marital status: Widowed    Spouse name: Not on file  . Number of children: 1  . Years of education: Not on file  . Highest education level: Not on file  Occupational History  . 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 Financial resource strain: Not on file  . Food insecurity    Worry: Not on file    Inability: Not on file  . Transportation needs    Medical: Not on file    Non-medical: Not on file  Tobacco Use  . Smoking status: Never Smoker  . Smokeless tobacco: Never Used  Substance and Sexual Activity  . Alcohol use: Yes    Alcohol/week: 0.0 standard drinks    Comment: occ  . Drug use: No  . Sexual activity: Not on file  Lifestyle  . Physical activity    Days per week: Not on file    Minutes per session: Not on file  . Stress: Not on file  Relationships  . Social connections    Talks on phone: Not on file  Gets together: Not on file    Attends religious service: Not on file    Active member of club or organization: Not on file    Attends meetings of clubs or organizations: Not on file    Relationship status: Not on file  . Intimate partner violence    Fear of current or ex partner: Not on file    Emotionally abused: Not on file    Physically abused: Not on file    Forced sexual activity: Not on file  Other Topics Concern  . Not on file  Social History Narrative   Regular exercise:  No   Caffeine Use:  1 cup coffee daily   Husband passed 2/17   Daughter, son-in-law and her 2 children- they recently moved out   Completed the 12th grade, and some college.   Does not work.     Past Surgical History:   Procedure Laterality Date  . ABDOMINAL HYSTERECTOMY  3 years ago  . BREAST CYST EXCISION Right 1978  . CESAREAN SECTION    . COLON SURGERY     LAR, reoperation for necrotic ileostomy, transverse colostomy  . COLOSTOMY  05/27/11  . COLOSTOMY TAKEDOWN  07/11/2012   Procedure: COLOSTOMY TAKEDOWN;  Surgeon: Rolm Bookbinder, MD;  Location: [facility];  Service: General;  Laterality: N/A;  colostomy takedown  . JOINT REPLACEMENT  2002   hip replacement  . [place]  . MASS EXCISION  2011   "mass removed from ovaries"  . PORT-A-CATH REMOVAL  07/11/2012   Procedure: REMOVAL PORT-A-CATH;  Surgeon: Rolm Bookbinder, MD;  Location: [facility];  Service: General;  Laterality: N/A;  removal portacath  . PORTACATH PLACEMENT  07/26/11   right portacath  . TOTAL ABDOMINAL HYSTERECTOMY W/ BILATERAL SALPINGOOPHORECTOMY  11/30/09   BSO  . TOTAL HIP ARTHROPLASTY  2002   left hip    Family History  Problem Relation Age of Onset  . Uterine cancer Mother 45       deceased 71  . Diabetes Father   . Colon cancer Sister 73       currently 63  . Alcoholism Brother     No Known Allergies  Current Outpatient Medications on File Prior to Visit  Medication Sig Dispense Refill  . aspirin 81 MG tablet Take 81 mg by mouth daily.      . B Complex Vitamins (B-COMPLEX/B-12 PO) Take 1 tablet by mouth daily.     Marland Kitchen buPROPion (WELLBUTRIN XL) 300 MG 24 hr tablet Take 1 tab by mouth once daily 90 tablet 0  . Calcium Carb-Cholecalciferol (CALCIUM-VITAMIN D3) 600-500 MG-UNIT CAPS Take 1 tablet by mouth 2 (two) times daily. 60 capsule 0  . escitalopram (LEXAPRO) 20 MG tablet TAKE 1 TABLET(20 MG) BY MOUTH DAILY 90 tablet 1  . Omega-3 Fatty Acids (FISH OIL) 1200 MG CAPS Take 1 capsule by mouth daily.     Current Facility-Administered Medications on File Prior to Visit  Medication Dose Route Frequency Provider Last Rate Last Dose  . sodium chloride 0.9 % injection 10 mL  10 mL Intravenous PRN [name],  MD   10 mL at 05/25/12 1153    BP (!) 184/86 (BP Location: Left Arm, Cuff Size: Large)   Pulse (!) 102   Temp (!) 97 F (36.1 C) (Temporal)   Resp 12   Wt 239 lb 3.2 oz (108.5 kg)   LMP 11/30/2009   SpO2 96%   BMI 46.72 kg/m  Objective:   Physical Exam General - no acute distress. Pleasant. Left thigh.- 3 simple sutures placed. Wound mild pink. Proximal part of wound fells indurated, pink and tender.          Assessment & Plan:  You appear to have wound infection of laceration area. I think this is delayed healing process. Better to start antibiotic and remove sutures on Friday. Rx advisement given on antibiotic.  For high bp I want you to start losartan 25 mg daily(low dose) and hctz 12.5 mg daily. Recheck bp on Friday as well.  Follow up Friday for suture removal and bp check.  Mackie Pai, PA-C

## 2019-07-13 ENCOUNTER — Other Ambulatory Visit: Payer: Self-pay

## 2019-07-13 ENCOUNTER — Ambulatory Visit (INDEPENDENT_AMBULATORY_CARE_PROVIDER_SITE_OTHER): Payer: PPO | Admitting: Family Medicine

## 2019-07-13 ENCOUNTER — Encounter: Payer: Self-pay | Admitting: Family Medicine

## 2019-07-13 ENCOUNTER — Ambulatory Visit: Payer: PPO | Admitting: Family

## 2019-07-13 VITALS — BP 128/84 | HR 84 | Temp 97.1°F | Resp 18 | Ht 60.0 in | Wt 231.6 lb

## 2019-07-13 DIAGNOSIS — S71112A Laceration without foreign body, left thigh, initial encounter: Secondary | ICD-10-CM

## 2019-07-13 NOTE — Patient Instructions (Signed)
Suture Removal, Care After This sheet gives you information about how to care for yourself after your procedure. Your health care provider may also give you more specific instructions. If you have problems or questions, contact your health care provider. What can I expect after the procedure? After your stitches (sutures) are removed, it is common to have:  Some discomfort and swelling in the area.  Slight redness in the area. Follow these instructions at home: If you have a bandage:  Wash your hands with soap and water before you change your bandage (dressing). If soap and water are not available, use hand sanitizer.  Change your dressing as told by your health care provider. If your dressing becomes wet or dirty, or develops a bad smell, change it as soon as possible.  If your dressing sticks to your skin, soak it in warm water to loosen it. Wound care   Check your wound every day for signs of infection. Check for: ? More redness, swelling, or pain. ? Fluid or blood. ? Warmth. ? Pus or a bad smell.  Wash your hands with soap and water before and after touching your wound.  Apply cream or ointment only as directed by your health care provider. If you are using cream or ointment, wash the area with soap and water 2 times a day to remove all the cream or ointment. Rinse off the soap and pat the area dry with a clean towel.  If you have skin glue or adhesive strips on your wound, leave these closures in place. They may need to stay in place for 2 weeks or longer. If adhesive strip edges start to loosen and curl up, you may trim the loose edges. Do not remove adhesive strips completely unless your health care provider tells you to do that.  Keep the wound area dry and clean. Do not take baths, swim, or use a hot tub until your health care provider approves.  Continue to protect the wound from injury.  Do not pick at your wound. Picking can cause an infection.  When your wound has  completely healed, wear sunscreen over it or cover it with clothing when you are outside. New scars get sunburned easily, which can make scarring worse. General instructions  Take over-the-counter and prescription medicines only as told by your health care provider.  Keep all follow-up visits as told by your health care provider. This is important. Contact a health care provider if:  You have redness, swelling, or pain around your wound.  You have fluid or blood coming from your wound.  Your wound feels warm to the touch.  You have pus or a bad smell coming from your wound.  Your wound opens up. Get help right away if:  You have a fever.  You have redness that is spreading from your wound. Summary  After your sutures are removed, it is common to have some discomfort and swelling in the area.  Wash your hands with soap and water before you change your bandage (dressing).  Keep the wound area dry and clean. Do not take baths, swim, or use a hot tub until your health care provider approves. This information is not intended to replace advice given to you by your health care provider. Make sure you discuss any questions you have with your health care provider. Document Released: 04/13/2001 Document Revised: 07/01/2017 Document Reviewed: 08/24/2016 Elsevier Patient Education  2020 Elsevier Inc.  

## 2019-07-13 NOTE — Progress Notes (Signed)
Patient ID: Jasmine Clayton, female    DOB: 1951-04-25  Age: 68 y.o. MRN: [id number]    Subjective:  Subjective  HPI Jasmine Clayton presents for suture removal.  Pt leg cut after glass table shattered on 11/30     3 stitches put in and tetanus given   Review of Systems  Constitutional: Negative for appetite change, diaphoresis, fatigue and unexpected weight change.  Eyes: Negative for pain, redness and visual disturbance.  Respiratory: Negative for cough, chest tightness, shortness of breath and wheezing.   Cardiovascular: Negative for chest pain, palpitations and leg swelling.  Endocrine: Negative for cold intolerance, heat intolerance, polydipsia, polyphagia and polyuria.  Genitourinary: Negative for difficulty urinating, dysuria and frequency.  Skin: Positive for wound.  Neurological: Negative for dizziness, light-headedness, numbness and headaches.    History Past Medical History:  Diagnosis Date  . Adenocarcinoma of colon ([place]) 04/12/2011   "rectal-colon"  . Corneal ulcers and infections 07/04/12  . Degenerative joint disease    right knee  . DVT (deep vein thrombosis) in pregnancy 1979   during pregnancy. lower extremity  . Family history of colon cancer   . Family history of uterine cancer   . History of chemotherapy     cycle 5 Folfox completed 09/23/11  . History of radiation therapy 10/25/11 -12/01/11   pelvis/presacral region  . Morbid obesity ([place])   . OSA (obstructive sleep apnea) 07/04/2018   Severe per split night study 11/19  Trial of CPAP therapy on 12 cm H2O with a Wide size Resmed Nasal Mask AirFit N10 mask and heated humidification.  . Osteopenia   . 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 Stress incontinence, female    not current    She has a past surgical history that includes Total abdominal hysterectomy w/ bilateral salpingoophorectomy (11/30/09); Total hip arthroplasty (2002); Mass  excision (2011); Cesarean section; Keloid excision (1982); Joint replacement (2002); Colostomy (05/27/11); Portacath placement (07/26/11); Abdominal hysterectomy (3 years ago); Colon surgery; Colostomy takedown (07/11/2012); Port-a-cath removal (07/11/2012); and Breast cyst excision (Right, 1978).   Her family history includes Alcoholism in her brother; Colon cancer (age of onset: 31) in her sister; Diabetes in her father; Uterine cancer (age of onset: 67) in her mother.She reports that she has never smoked. She has never used smokeless tobacco. She reports current alcohol use. She reports that she does not use drugs.  Current Outpatient Medications on File Prior to Visit  Medication Sig Dispense Refill  . aspirin 81 MG tablet Take 81 mg by mouth daily.      . B Complex Vitamins (B-COMPLEX/B-12 PO) Take 1 tablet by mouth daily.     Marland Kitchen buPROPion (WELLBUTRIN XL) 300 MG 24 hr tablet Take 1 tab by mouth once daily 90 tablet 0  . Calcium Carb-Cholecalciferol (CALCIUM-VITAMIN D3) 600-500 MG-UNIT CAPS Take 1 tablet by mouth 2 (two) times daily. 60 capsule 0  . doxycycline (VIBRA-TABS) 100 MG tablet Take 1 tablet (100 mg total) by mouth 2 (two) times daily. Can give caps or generic. 14 tablet 0  . escitalopram (LEXAPRO) 20 MG tablet TAKE 1 TABLET(20 MG) BY MOUTH DAILY 90 tablet 1  . hydrochlorothiazide (HYDRODIURIL) 12.5 MG tablet Take 1 tablet (12.5 mg total) by mouth daily. 30 tablet 0  . losartan (COZAAR) 25 MG tablet Take 1 tablet (25 mg total) by mouth daily. 30 tablet 0  . Omega-3 Fatty Acids (FISH OIL) 1200 MG CAPS Take  1 capsule by mouth daily.     Current Facility-Administered Medications on File Prior to Visit  Medication Dose Route Frequency Provider Last Rate Last Admin  . sodium chloride 0.9 % injection 10 mL  10 mL Intravenous PRN [name], MD   10 mL at 05/25/12 1153     Objective:  Objective  Physical Exam Vitals and nursing note reviewed.  Skin:    General: Skin is warm and  dry.         BP 128/84 (BP Location: Left Arm, Patient Position: Sitting, Cuff Size: Large)   Pulse 84   Temp (!) 97.1 F (36.2 C) (Temporal)   Resp 18   Ht 5' (1.524 m)   Wt 231 lb 9.6 oz (105.1 kg)   LMP 11/30/2009   SpO2 95%   BMI 45.23 kg/m  Wt Readings from Last 3 Encounters:  07/13/19 231 lb 9.6 oz (105.1 kg)  07/10/19 239 lb 3.2 oz (108.5 kg)  07/09/19 238 lb 6.4 oz (108.1 kg)     Lab Results  Component Value Date   WBC 6.2 08/25/2016   HGB 14.2 08/25/2016   HCT 42.3 08/25/2016   PLT 211.0 08/25/2016   GLUCOSE 117 (H) 06/15/2019   CHOL 185 08/25/2016   TRIG 115.0 08/25/2016   HDL 53.10 08/25/2016   LDLCALC 109 (H) 08/25/2016   ALT 23 08/25/2016   AST 20 08/25/2016   NA 143 06/15/2019   K 3.7 06/15/2019   CL 106 06/15/2019   CREATININE 1.18 06/15/2019   BUN 16 06/15/2019   CO2 27 06/15/2019   TSH 3.39 08/25/2016   HGBA1C 5.3 02/16/2017    US BREAST LTD UNI LEFT INC AXILLA  Result Date: 07/09/2019 CLINICAL DATA:  Patient complains of a palpable abnormality in the left breast and 1 episode "fluid in her bra" (presumed nipple discharge). EXAM: DIGITAL DIAGNOSTIC BILATERAL MAMMOGRAM WITH CAD AND TOMO ULTRASOUND LEFT BREAST COMPARISON:  Previous exam(s). ACR Breast Density Category b: There are scattered areas of fibroglandular density. FINDINGS: No suspicious mass, malignant type microcalcifications or distortion detected in either breast. Mammographic images were processed with CAD. On physical exam, I do not palpate a mass in the area of clinical concern in the 2 o'clock region of the left breast. No nipple discharge could be expressed. Targeted ultrasound is performed, showing normal tissue in the area of clinical concern in the 2 o'clock region of the left breast in the subareolar region. No intraductal mass or ductal dilatation identified. IMPRESSION: No evidence of malignancy in either breast. RECOMMENDATION: Bilateral screening mammogram in 1 year is  recommended. Concerning nipple discharge was discussed with the patient. If she experiences concerning nipple discharge further evaluation may be needed. I have discussed the findings and recommendations with the patient. If applicable, a reminder letter will be sent to the patient regarding the next appointment. BI-RADS CATEGORY  1: Negative. Electronically Signed   By: Lillia Mountain M.D.   On: 07/09/2019 14:40   MM DIAG BREAST TOMO BILATERAL  Result Date: 07/09/2019 CLINICAL DATA:  Patient complains of a palpable abnormality in the left breast and 1 episode "fluid in her bra" (presumed nipple discharge). EXAM: DIGITAL DIAGNOSTIC BILATERAL MAMMOGRAM WITH CAD AND TOMO ULTRASOUND LEFT BREAST COMPARISON:  Previous exam(s). ACR Breast Density Category b: There are scattered areas of fibroglandular density. FINDINGS: No suspicious mass, malignant type microcalcifications or distortion detected in either breast. Mammographic images were processed with CAD. On physical exam, I do not palpate a mass  in the area of clinical concern in the 2 o'clock region of the left breast. No nipple discharge could be expressed. Targeted ultrasound is performed, showing normal tissue in the area of clinical concern in the 2 o'clock region of the left breast in the subareolar region. No intraductal mass or ductal dilatation identified. IMPRESSION: No evidence of malignancy in either breast. RECOMMENDATION: Bilateral screening mammogram in 1 year is recommended. Concerning nipple discharge was discussed with the patient. If she experiences concerning nipple discharge further evaluation may be needed. I have discussed the findings and recommendations with the patient. If applicable, a reminder letter will be sent to the patient regarding the next appointment. BI-RADS CATEGORY  1: Negative. Electronically Signed   By: Lillia Mountain M.D.   On: 07/09/2019 14:40     Assessment & Plan:  Plan  I am having Wandy H. Decarli maintain her  aspirin, B Complex Vitamins (B-COMPLEX/B-12 PO), Fish Oil, Calcium-Vitamin D3, escitalopram, buPROPion, doxycycline, losartan, and hydrochlorothiazide.  No orders of the defined types were placed in this encounter.   Problem List Items Addressed This Visit    None      Follow-up: No follow-ups on file.  Ann Held, DO

## 2019-07-20 ENCOUNTER — Other Ambulatory Visit: Payer: Self-pay

## 2019-07-20 ENCOUNTER — Ambulatory Visit (HOSPITAL_BASED_OUTPATIENT_CLINIC_OR_DEPARTMENT_OTHER)
Admission: RE | Admit: 2019-07-20 | Discharge: 2019-07-20 | Disposition: A | Discharge status: Home / Self Care | Payer: PPO | Source: Ambulatory Visit | Attending: Family Medicine | Admitting: Family Medicine

## 2019-07-20 DIAGNOSIS — Z780 Asymptomatic menopausal state: Secondary | ICD-10-CM | POA: Insufficient documentation

## 2019-07-20 DIAGNOSIS — Z13820 Encounter for screening for osteoporosis: Secondary | ICD-10-CM | POA: Diagnosis not present

## 2019-07-20 DIAGNOSIS — E2839 Other primary ovarian failure: Secondary | ICD-10-CM | POA: Diagnosis not present

## 2019-07-22 ENCOUNTER — Encounter: Payer: Self-pay | Admitting: Family

## 2019-07-23 ENCOUNTER — Other Ambulatory Visit: Payer: Self-pay

## 2019-07-23 NOTE — Progress Notes (Signed)
Mailed out to pt 

## 2019-07-24 ENCOUNTER — Encounter: Payer: Self-pay | Admitting: Family

## 2019-07-24 ENCOUNTER — Ambulatory Visit (INDEPENDENT_AMBULATORY_CARE_PROVIDER_SITE_OTHER): Payer: PPO | Admitting: Family

## 2019-07-24 ENCOUNTER — Other Ambulatory Visit: Payer: Self-pay

## 2019-07-24 VITALS — BP 161/83 | HR 79 | Temp 97.6°F | Resp 18 | Ht 65.0 in | Wt 236.6 lb

## 2019-07-24 DIAGNOSIS — F32A Depression, unspecified: Secondary | ICD-10-CM

## 2019-07-24 DIAGNOSIS — F329 Major depressive disorder, single episode, unspecified: Secondary | ICD-10-CM

## 2019-07-24 DIAGNOSIS — I10 Essential (primary) hypertension: Secondary | ICD-10-CM

## 2019-07-24 DIAGNOSIS — Z23 Encounter for immunization: Secondary | ICD-10-CM

## 2019-07-24 LAB — BASIC METABOLIC PANEL
BUN: 15 mg/dL (ref 6–23)
CO2: 30 mEq/L (ref 19–32)
Calcium: 9.4 mg/dL (ref 8.4–10.5)
Chloride: 101 mEq/L (ref 96–112)
Creatinine, Ser: 1.04 mg/dL (ref 0.40–1.20)
GFR: 63.88 mL/min (ref >60.00)
Glucose, Bld: 87 mg/dL (ref 70–99)
Potassium: 3.6 mEq/L (ref 3.5–5.1)
Sodium: 139 mEq/L (ref 135–145)

## 2019-07-24 NOTE — Patient Instructions (Addendum)
  Please complete lab work prior to leaving.  Please call psychiatry to schedule an appointment.  Psychiatric Services:  [facility], [place] [address] [place], [place] [facility] [place], [place], [facility], [address] [place], [place]

## 2019-07-24 NOTE — Progress Notes (Signed)
Subjective:    Patient ID: Jasmine Clayton, female    DOB: 1951/07/16, 68 y.o.   MRN: [id number]  HPI  Patient is a 68 yr old female who presents today for follow up.  HTN- BP medications include losartan and hydrochlorothiazide. Reports that she took her meds on 12/11 but forgot today.  BP Readings from Last 3 Encounters:  07/24/19 (!) 161/83  07/13/19 128/84  07/10/19 (!) 178/79   Last visit we sent her for a diagnostic mammogram and ultrasound.    Depression- maintained on lexapro 20mg  as well as wellbutrin. Reports that she is taking care of her grandchildren 4 days a week but on the days she does have off she "stays in bed all day."  Want's to motivate to get her house in order but can't seem to motivate.     Review of Systems    see HPI  Past Medical History:  Diagnosis Date  . Adenocarcinoma of colon ([place]) 04/12/2011   "rectal-colon"  . Corneal ulcers and infections 07/04/12  . Degenerative joint disease    right knee  . DVT (deep vein thrombosis) in pregnancy 1979   during pregnancy. lower extremity  . Family history of colon cancer   . Family history of uterine cancer   . History of chemotherapy     cycle 5 Folfox completed 09/23/11  . History of radiation therapy 10/25/11 -12/01/11   pelvis/presacral region  . Morbid obesity ([place])   . OSA (obstructive sleep apnea) 07/04/2018   Severe per split night study 11/19  Trial of CPAP therapy on 12 cm H2O with a Wide size Resmed Nasal Mask AirFit N10 mask and heated humidification.  . Osteopenia   . 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 Stress incontinence, female    not current     Social History   Socioeconomic History  . Marital status: Widowed    Spouse name: Not on file  . Number of children: 1  . Years of education: Not on file  . Highest education level: Not on file  Occupational History  . 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 Smoking status: Never Smoker  . Smokeless tobacco: Never Used  Substance and Sexual Activity  . Alcohol use: Yes    Alcohol/week: 0.0 standard drinks    Comment: occ  . Drug use: No  . Sexual activity: Not on file  Other Topics Concern  . Not on file  Social History Narrative   Regular exercise:  No   Caffeine Use:  1 cup coffee daily   Husband passed 2/17   Daughter, son-in-law and her 2 children- they recently moved out   Completed the 12th grade, and some college.   Does not work.    Social Determinants of Health   Financial Resource Strain:   . Difficulty of Paying Living Expenses: Not on file  Food Insecurity:   . Worried About Charity fundraiser in the Last Year: Not on file  . Ran Out of Food in the Last Year: Not on file  Transportation Needs:   . Lack of Transportation (Medical): Not on file  . Lack of Transportation (Non-Medical): Not on file  Physical Activity:   . Days of Exercise per Week: Not on file  . Minutes of Exercise per Session: Not on file  Stress:   . Feeling of Stress : Not on  file  Social Connections:   . Frequency of Communication with Friends and Family: Not on file  . Frequency of Social Gatherings with Friends and Family: Not on file  . Attends Religious Services: Not on file  . Active Member of Clubs or Organizations: Not on file  . Attends Archivist Meetings: Not on file  . Marital Status: Not on file  Intimate Partner Violence:   . Fear of Current or Ex-Partner: Not on file  . Emotionally Abused: Not on file  . Physically Abused: Not on file  . Sexually Abused: Not on file    Past Surgical History:  Procedure Laterality Date  . ABDOMINAL HYSTERECTOMY  3 years ago  . BREAST CYST EXCISION Right 1978  . CESAREAN SECTION    . COLON SURGERY     LAR, reoperation for necrotic ileostomy, transverse colostomy  . COLOSTOMY  05/27/11  . COLOSTOMY TAKEDOWN  07/11/2012   Procedure: COLOSTOMY  TAKEDOWN;  Surgeon: Rolm Bookbinder, MD;  Location: [facility];  Service: General;  Laterality: N/A;  colostomy takedown  . JOINT REPLACEMENT  2002   hip replacement  . [place]  . MASS EXCISION  2011   "mass removed from ovaries"  . PORT-A-CATH REMOVAL  07/11/2012   Procedure: REMOVAL PORT-A-CATH;  Surgeon: Rolm Bookbinder, MD;  Location: [facility];  Service: General;  Laterality: N/A;  removal portacath  . PORTACATH PLACEMENT  07/26/11   right portacath  . TOTAL ABDOMINAL HYSTERECTOMY W/ BILATERAL SALPINGOOPHORECTOMY  11/30/09   BSO  . TOTAL HIP ARTHROPLASTY  2002   left hip    Family History  Problem Relation Age of Onset  . Uterine cancer Mother 22       deceased 44  . Diabetes Father   . Colon cancer Sister 30       currently 39  . Alcoholism Brother     No Known Allergies  Current Outpatient Medications on File Prior to Visit  Medication Sig Dispense Refill  . aspirin 81 MG tablet Take 81 mg by mouth daily.      . B Complex Vitamins (B-COMPLEX/B-12 PO) Take 1 tablet by mouth daily.     Marland Kitchen buPROPion (WELLBUTRIN XL) 300 MG 24 hr tablet Take 1 tab by mouth once daily 90 tablet 0  . Calcium Carb-Cholecalciferol (CALCIUM-VITAMIN D3) 600-500 MG-UNIT CAPS Take 1 tablet by mouth 2 (two) times daily. 60 capsule 0  . escitalopram (LEXAPRO) 20 MG tablet TAKE 1 TABLET(20 MG) BY MOUTH DAILY 90 tablet 1  . hydrochlorothiazide (HYDRODIURIL) 12.5 MG tablet Take 1 tablet (12.5 mg total) by mouth daily. 30 tablet 0  . losartan (COZAAR) 25 MG tablet Take 1 tablet (25 mg total) by mouth daily. 30 tablet 0  . Omega-3 Fatty Acids (FISH OIL) 1200 MG CAPS Take 1 capsule by mouth daily.     Current Facility-Administered Medications on File Prior to Visit  Medication Dose Route Frequency Provider Last Rate Last Admin  . sodium chloride 0.9 % injection 10 mL  10 mL Intravenous PRN [name], MD   10 mL at 05/25/12 1153    BP (!) 161/83 (BP Location: Left Arm, Patient Position:  Sitting, Cuff Size: Normal)   Pulse 79   Temp 97.6 F (36.4 C) (Temporal)   Resp 18   Ht 5\' 5"  (1.651 m)   Wt 236 lb 9.6 oz (107.3 kg)   LMP 11/30/2009   SpO2 98%   BMI 39.37 kg/m    Objective:  Physical Exam Constitutional:      Appearance: Normal appearance. She is well-developed.  HENT:     Head: Normocephalic and atraumatic.  Cardiovascular:     Rate and Rhythm: Normal rate and regular rhythm.     Heart sounds: Normal heart sounds. No murmur.  Pulmonary:     Effort: Pulmonary effort is normal. No respiratory distress.     Breath sounds: Normal breath sounds. No wheezing.  Neurological:     Mental Status: She is alert.  Psychiatric:        Behavior: Behavior normal.        Thought Content: Thought content normal.        Judgment: Judgment normal.           Assessment & Plan:  HTN- bp elevated today likely due to non-compliance. Reinforced compliance.  Obtain follow up bmet.  Depression- uncontrolled. Will refer to psychiatry.    Pneumovax 23 today.  This visit occurred during the SARS-CoV-2 public health emergency.  Safety protocols were in place, including screening questions prior to the visit, additional usage of staff PPE, and extensive cleaning of exam room while observing appropriate contact time as indicated for disinfecting solutions.

## 2019-07-29 ENCOUNTER — Encounter: Payer: Self-pay | Admitting: Family

## 2019-08-06 ENCOUNTER — Ambulatory Visit (INDEPENDENT_AMBULATORY_CARE_PROVIDER_SITE_OTHER): Payer: Medicare HMO | Admitting: Psychology

## 2019-08-06 DIAGNOSIS — F331 Major depressive disorder, recurrent, moderate: Secondary | ICD-10-CM

## 2019-08-08 ENCOUNTER — Other Ambulatory Visit: Payer: Self-pay | Admitting: Medical

## 2019-08-08 ENCOUNTER — Other Ambulatory Visit: Payer: Self-pay | Admitting: Family

## 2019-08-09 NOTE — Progress Notes (Signed)
Mailed out to patient 

## 2019-09-03 ENCOUNTER — Other Ambulatory Visit: Payer: Self-pay | Admitting: Medical

## 2019-09-25 ENCOUNTER — Telehealth: Payer: Self-pay | Admitting: Family

## 2019-09-25 NOTE — Progress Notes (Signed)
°  Chronic Care Management   Outreach Note  09/25/2019 Name: Jasmine Clayton MRN: [id number] DOB: Apr 19, 1951  Referred by: Debbrah Alar, NP Reason for referral : No chief complaint on file.   An unsuccessful telephone outreach was attempted today. The patient was referred to the pharmacist for assistance with care management and care coordination.   Follow Up Plan:   [name] [employer] Scheduler

## 2019-09-28 ENCOUNTER — Telehealth: Payer: Self-pay | Admitting: Family

## 2019-09-28 NOTE — Chronic Care Management (AMB) (Signed)
  Chronic Care Management   Note  09/28/2019 Name: Jasmine Clayton MRN: [id number] DOB: 05/31/1951  Jasmine Clayton is a 69 y.o. year old female who is a primary care patient of Debbrah Alar, NP. I reached out to [facility] by phone today in response to a referral sent by Ms. Alyona H Baltzell's PCP, Debbrah Alar, NP.   Ms. Bones was given information about Chronic Care Management services today including:  1. CCM service includes personalized support from designated clinical staff supervised by her physician, including individualized plan of care and coordination with other care providers 2. 24/7 contact phone numbers for assistance for urgent and routine care needs. 3. Service will only be billed when office clinical staff spend 20 minutes or more in a month to coordinate care. 4. Only one practitioner may furnish and bill the service in a calendar month. 5. The patient may stop CCM services at any time (effective at the end of the month) by phone call to the office staff. 6. The patient will be responsible for cost sharing (co-pay) of up to 20% of the service fee (after annual deductible is met).  Patient agreed to services and verbal consent obtained.   Follow up plan:   [name] [employer] Scheduler

## 2019-10-09 ENCOUNTER — Other Ambulatory Visit: Payer: Self-pay

## 2019-10-09 MED ORDER — LOSARTAN POTASSIUM 25 MG PO TABS: ORAL_TABLET | ORAL | 0 refills | Status: DC

## 2019-10-09 MED ORDER — HYDROCHLOROTHIAZIDE 12.5 MG PO TABS: ORAL_TABLET | ORAL | 0 refills | Status: DC

## 2019-10-11 ENCOUNTER — Other Ambulatory Visit: Payer: Self-pay

## 2019-10-11 ENCOUNTER — Ambulatory Visit: Admitting: Pharmacist

## 2019-10-11 DIAGNOSIS — F329 Major depressive disorder, single episode, unspecified: Secondary | ICD-10-CM

## 2019-10-11 DIAGNOSIS — F32A Depression, unspecified: Secondary | ICD-10-CM

## 2019-10-11 DIAGNOSIS — G4733 Obstructive sleep apnea (adult) (pediatric): Secondary | ICD-10-CM

## 2019-10-11 DIAGNOSIS — G4700 Insomnia, unspecified: Secondary | ICD-10-CM

## 2019-10-11 DIAGNOSIS — M8580 Other specified disorders of bone density and structure, unspecified site: Secondary | ICD-10-CM

## 2019-10-11 DIAGNOSIS — M1711 Unilateral primary osteoarthritis, right knee: Secondary | ICD-10-CM

## 2019-10-11 DIAGNOSIS — R739 Hyperglycemia, unspecified: Secondary | ICD-10-CM

## 2019-10-11 DIAGNOSIS — I10 Essential (primary) hypertension: Secondary | ICD-10-CM

## 2019-10-11 NOTE — Chronic Care Management (AMB) (Signed)
Chronic Care Management Pharmacy  Name: Jasmine Clayton  MRN: [id number] DOB: [birth year]  Chief Complaint/ HPI  Jasmine Clayton,  69 y.o. , female presents for their Initial CCM visit with the clinical pharmacist via telephone due to COVID-19 Pandemic.  PCP : Jasmine Alar, NP  Their chronic conditions include: Pre-DM, HTN, Depression, Osteoarthritis, Osteopenia, OSA, Insomnia  Office Visits: 07/24/19: Visit w/ Jasmine Alar, NP - BP elevation most likely due to non-compliance. Referral to psych for depression. Pneumovax 23 given. BMP ordered.   07/13/19: Visit w/ Dr. Etter Clayton - Suture removal. Pt cut leg on glass table 07-02-19; received 3 stitches + tetanus vaccine.  07/10/19: Visit w/ Jasmine Pai, PA-C - Left thigh laceration with suture placement is tender per pt. Abx (doxycycline) given plus losartan and hctz for BP elevation  07/09/19: Medicare Annual Wellness Exam w/ Jasmine Plummer, RN - Goals updated to include increasing water intake and decluttering home  06/26/19: Visit w/ Jasmine Alar, NP - [place] ED visit on 06/20/19 for L sided breast pain. She had L nipple discharge. Bedside US at ED visit negative for fluid collection/abscess. Last Mammo 05/17/17. Send for diagnostic mammo at [facility].   ED Visit:  07/02/19: [facility] ED - Laceration to left lower extremity. Granddaughter shattered a glass table that resulted in puncture laceration to L thigh. 3 sutures placed + TDap given.   Medications: Outpatient Encounter Medications as of 10/11/2019  Medication Sig  . aspirin 81 MG tablet Take 81 mg by mouth daily.    . B Complex Vitamins (B-COMPLEX/B-12 PO) Take 1 tablet by mouth daily.   Marland Kitchen buPROPion (WELLBUTRIN XL) 300 MG 24 hr tablet Take 1 tab by mouth once daily  . Calcium Carb-Cholecalciferol (CALCIUM-VITAMIN D3) 600-500 MG-UNIT CAPS Take 1 tablet by mouth 2 (two) times daily.  Marland Kitchen escitalopram (LEXAPRO) 20 MG tablet TAKE 1 TABLET(20 MG) BY  MOUTH DAILY  . hydrochlorothiazide (HYDRODIURIL) 12.5 MG tablet TAKE 1 TABLET(12.5 MG) BY MOUTH DAILY  . losartan (COZAAR) 25 MG tablet TAKE 1 TABLET(25 MG) BY MOUTH DAILY  . meloxicam (MOBIC) 7.5 MG tablet TAKE 1 TABLET(7.5 MG) BY MOUTH DAILY  . Omega-3 Fatty Acids (FISH OIL) 1200 MG CAPS Take 1 capsule by mouth daily.   Facility-Administered Encounter Medications as of 10/11/2019  Medication  . sodium chloride 0.9 % injection 10 mL   Immunization History  Administered Date(s) Administered  . Fluad Quad(high Dose 65+) 05/15/2019  . Influenza Split 08/28/2011  . Influenza, High Dose Seasonal PF 05/04/2017, 04/11/2018  . Influenza,inj,Quad PF,6+ Mos 06/04/2013, 04/05/2014, 05/26/2015, 04/23/2016  . Pneumococcal Conjugate-13 01/09/2018  . Pneumococcal Polysaccharide-23 07/17/2014, 07/24/2019  . Tdap 03/15/2011, 07/02/2019  . Zoster 02/16/2013     Current Diagnosis/Assessment:  Goals Addressed            This Visit's Progress   . A1c goal less than 6.5%       -Pre-Diabetes a1c range is 5.7% to 6.4%. Your most recent a1c was 5.3% on 02/16/2017. It was 5.7% on 04/23/2016.  -Usually the full diabetes diagnosis is given when a1c reaches 6.5% or higher.     . Blood pressure goal less than 140/90      . Check blood pressure 3 to 4 times per week and record readings      . Complete a1c lab at next office visit      . Complete lipid panel at next office visit      . Increase meloxicam to 15mg  daily as  needed for pain      . LDL goal less than 100      . Pharmacy Care Plan       CARE PLAN ENTRY  Current Barriers:  . Chronic Disease Management support, education, and care coordination needs related to Pre-DM, HTN, Depression, Osteoarthritis, Osteopenia, OSA, Insomnia  Pharmacist Clinical Goal(s):  Marland Kitchen Over the next 90 days, patient will demonstrate Improved medication adherence as evidenced by appropriate fill dates with the assistance of adherence packaging . BP goal < 140/90 . A1c  goal <6.5% . LDL goal <100 . Stable bone density  . Complete a1c at next office visit . Complete lipid panel at next office visit  Interventions: . Comprehensive medication review performed. Marland Kitchen Purchase BP cuff and check BP 3-4 times per week . Increase meloxicam to 15mg  when needed to see if this helps further with pain . Take calcium/vitamin D daily to help with bone strength . Discontinue escitalopram and start fluoxetine  Patient Self Care Activities:  . Patient verbalizes understanding of plan to follow as described above, Self administers medications as prescribed, Calls pharmacy for medication refills, and Calls provider office for new concerns or questions  Initial goal documentation     . Purchase blood pressure cuff      . Stop taking escitalopram and start taking fluoxetine 20mg  daily      . Take calcium/vitamin D daily to help with bone strength        Social Hx:  Husband was a English as a second language teacher. Deceased for 4 years. One daughter. Two grandchildren.   Keeps meds together all together in a pouch. Keeps meds in bottles and takes them from bottles  Pre-Diabetes   Recent Relevant Labs: Lab Results  Component Value Date/Time   HGBA1C 5.3 02/16/2017 09:14 AM   HGBA1C 5.7 04/23/2016 08:56 AM    Patient is currently controlled on the following medications: None  We discussed: diet and exercise extensively  Plan -Complete a1c lab at next office visit -Continue current medications and control with diet and exercise   Hypertension   CMP Latest Ref Rng & Units 07/24/2019 06/15/2019 05/15/2019  Glucose 70 - 99 mg/dL 87 117(H) 92  BUN 6 - 23 mg/dL 15 16 17   Creatinine 0.40 - 1.20 mg/dL 1.04 1.18 1.32(H)  Sodium 135 - 145 mEq/L 139 143 138  Potassium 3.5 - 5.1 mEq/L 3.6 3.7 3.8  Chloride 96 - 112 mEq/L 101 106 99  CO2 19 - 32 mEq/L 30 27 28   Calcium 8.4 - 10.5 mg/dL 9.4 9.1 9.8  Total Protein 6.0 - 8.3 g/dL - - -  Total Bilirubin 0.2 - 1.2 mg/dL - - -  Alkaline Phos 39 -  117 U/L - - -  AST 0 - 37 U/L - - -  ALT 0 - 35 U/L - - -  GFR      63.88   55.23   48.54  BP today is:  Unable to assess due to phone visit  Office blood pressures are  BP Readings from Last 3 Encounters:  07/24/19 (!) 161/83  07/13/19 128/84  07/10/19 (!) 178/79    Patient has failed these meds in the past: None noted  Patient is currently uncontrolled on the following medications: losartan 25mg  daily, hctz 12.5mg  daily  Patient checks BP at home Never. Does not have BP cuff  We discussed Proper blood pressure measurement technique  Plan -Purchase blood pressure cuff -Check blood pressure 3-4 times per week and record readings -Continue  current medications    Hyperlipidemia Risk   Lipid Panel     Component Value Date/Time   CHOL 185 08/25/2016 0805   TRIG 115.0 08/25/2016 0805   HDL 53.10 08/25/2016 0805   CHOLHDL 3 08/25/2016 0805   VLDL 23.0 08/25/2016 0805   LDLCALC 109 (H) 08/25/2016 0805     ASCVD 10-year risk: 15.4%  Takes aspirin about twice a week.   Patient has failed these meds in past: None noted  Patient is currently uncontrolled on the following medications: omega 3 1200mg  daily (usually only takes 3 times per week)  We discussed:  diet and exercise extensively and risk factors for ASCVD  Plan -Complete lipid panel at next office visit -Continue current medications   Depression    Patient has failed these meds in past: None noted Patient is currently controlled on the following medications: escitalopram 20mg  daily, bupropion 300mg  XL daily  Prozac: Taken in the past. It worked well for her. She started to feel better and weaned herself off.  She has talked to Terri in the past and felt it was helpful. Was talking to her about every 3 months   She has not been connected to psych. She is still interested in being connected.  Patient Assessment of Medication Efficiacy: "I don't really see the difference. Things are still the same with me"    PHQ9: 14   We discussed:  replacing escitalopram with fluoxetine as patient felt fluoxetine worked well  Plan  -Start fluoxetine 20mg  once daily to replace escitalopram. Can titrate dose as necessary.  -If still no relief with switch to fluoxetine can get patient connected to psych. Expressed that she is always welcome to connect to psych. Gave patient number to possibly get linked with Terri [phone]).  -Continue current medications   Osteoarthritis    Patient has failed these meds in past: None noted  Patient is currently uncontrolled on the following medications: meloxicam 7.5mg  daily (only uses about 3 times per week)  Feels current dose does not work, but still takes it  We discussed:  Risk benefit of increasing meloxicam dose considering renal function  Plan -Increase meloxicam to 15mg  PRN (about 3 times per week)  Osteopenia   07/20/2019  Patient has failed these meds in past: None noted Patient is currently controlled on the following medications: calcium/vitaminD 600-500 units daily (takes 3 times per week)   Takes calcium-vitamin D about 3 times per week. One daily when she does take it  We discussed:  Importance of calcium and vitamin D supplementation for bone health  Plan -Consistent use of calcium/vitamin D (1 tab daily every Alper Guilmette) -Continue current medications   OSA    Patient has failed these meds in past: None noted  Was supposed to get set up with CPAP, but she did not want anyone to come into her home due to the clutter.   We discussed:  How her depression is limiting her from receiving the care that she needs  Plan -Assist patient in getting her house in order so that she can feel more comfortable about having people come to her home  Insomnia    Patient has failed these meds in past: Zolpidem (listed in D/C meds. No apparent reason for D/C) Patient is currently stable on the following medications: None  Can't sleep about 3 nights  per week. Would like to explore non-pharm options first.   We discussed:  sleep hygiene  Plan -Continue with lifestyle modifications  Miscellanous Vitamin  B complex 3 times per week Vitafusion  Verbal consent obtained for [facility] enhanced pharmacy services (medication synchronization, adherence packaging, delivery coordination). A medication sync plan was created to allow patient to get all medications delivered once every 30 to 90 days per patient preference. Patient understands they have freedom to choose pharmacy and clinical pharmacist will coordinate care between all prescribers and [facility].

## 2019-10-11 NOTE — Patient Instructions (Addendum)
Visit Information  Goals Addressed            This Visit's Progress   . A1c goal less than 6.5%       -Pre-Diabetes a1c range is 5.7% to 6.4%. Your most recent a1c was 5.3% on 02/16/2017. It was 5.7% on 04/23/2016.  -Usually the full diabetes diagnosis is given when a1c reaches 6.5% or higher.     . Blood pressure goal less than 140/90      . Check blood pressure 3 to 4 times per week and record readings      . Complete a1c lab at next office visit      . Complete lipid panel at next office visit      . Increase meloxicam to 15mg  daily as needed for pain      . LDL goal less than 100      . Pharmacy Care Plan       CARE PLAN ENTRY  Current Barriers:  . Chronic Disease Management support, education, and care coordination needs related to Pre-DM, HTN, Depression, Osteoarthritis, Osteopenia, OSA, Insomnia  Pharmacist Clinical Goal(s):  Marland Kitchen Over the next 90 days, patient will demonstrate Improved medication adherence as evidenced by appropriate fill dates with the assistance of adherence packaging . BP goal < 140/90 . A1c goal <6.5% . LDL goal <100 . Stable bone density  . Complete a1c at next office visit . Complete lipid panel at next office visit  Interventions: . Comprehensive medication review performed. Marland Kitchen Purchase BP cuff and check BP 3-4 times per week . Increase meloxicam to 15mg  when needed to see if this helps further with pain . Take calcium/vitamin D daily to help with bone strength . Discontinue escitalopram and start fluoxetine  Patient Self Care Activities:  . Patient verbalizes understanding of plan to follow as described above, Self administers medications as prescribed, Calls pharmacy for medication refills, and Calls provider office for new concerns or questions  Initial goal documentation     . Purchase blood pressure cuff      . Stop taking escitalopram and start taking fluoxetine 20mg  daily      . Take calcium/vitamin D daily to help with bone strength          Jasmine Clayton was given information about Chronic Care Management services today including:  1. CCM service includes personalized support from designated clinical staff supervised by her physician, including individualized plan of care and coordination with other care providers 2. 24/7 contact phone numbers for assistance for urgent and routine care needs. 3. Standard insurance, coinsurance, copays and deductibles apply for chronic care management only during months in which we provide at least 20 minutes of these services. Most insurances cover these services at 100%, however patients may be responsible for any copay, coinsurance and/or deductible if applicable. This service may help you avoid the need for more expensive face-to-face services. 4. Only one practitioner may furnish and bill the service in a calendar month. 5. The patient may stop CCM services at any time (effective at the end of the month) by phone call to the office staff.  Verbal consent obtained for [facility] enhanced pharmacy services (medication synchronization, adherence packaging, delivery coordination). A medication sync plan was created to allow patient to get all medications delivered once every 30 to 90 days per patient preference. Patient understands they have freedom to choose pharmacy and clinical pharmacist will coordinate care between all prescribers and [facility].   Patient agreed to  services and verbal consent obtained.   The patient verbalized understanding of instructions provided today and agreed to receive a mailed copy of patient instruction and/or educational materials. The pharmacy team will reach out to the patient again over the next 14 days.   De Blanch, PharmD Clinical Pharmacist [facility] [phone]   Prediabetes Eating Plan Prediabetes is a condition that causes blood sugar (glucose) levels to be higher than normal. This increases the  risk for developing diabetes. In order to prevent diabetes from developing, your health care provider may recommend a diet and other lifestyle changes to help you:  Control your blood glucose levels.  Improve your cholesterol levels.  Manage your blood pressure. Your health care provider may recommend working with a diet and nutrition specialist (dietitian) to make a meal plan that is best for you. What are tips for following this plan? Lifestyle  Set weight loss goals with the help of your health care team. It is recommended that most people with prediabetes lose 7% of their current body weight.  Exercise for at least 30 minutes at least 5 days a week.  Attend a support group or seek ongoing support from a mental health counselor.  Take over-the-counter and prescription medicines only as told by your health care provider. Reading food labels  Read food labels to check the amount of fat, salt (sodium), and sugar in prepackaged foods. Avoid foods that have: ? Saturated fats. ? Trans fats. ? Added sugars.  Avoid foods that have more than 300 milligrams (mg) of sodium per serving. Limit your daily sodium intake to less than 2,300 mg each Jasmine Clayton. Shopping  Avoid buying pre-made and processed foods. Cooking  Cook with olive oil. Do not use butter, lard, or ghee.  Bake, broil, grill, or boil foods. Avoid frying. Meal planning   Work with your dietitian to develop an eating plan that is right for you. This may include: ? Tracking how many calories you take in. Use a food diary, notebook, or mobile application to track what you eat at each meal. ? Using the glycemic index (GI) to plan your meals. The index tells you how quickly a food will raise your blood glucose. Choose low-GI foods. These foods take a longer time to raise blood glucose.  Consider following a Mediterranean diet. This diet includes: ? Several servings each Jasmine Clayton of fresh fruits and vegetables. ? Eating fish at least  twice a week. ? Several servings each Jasmine Clayton of whole grains, beans, nuts, and seeds. ? Using olive oil instead of other fats. ? Moderate alcohol consumption. ? Eating small amounts of red meat and whole-fat dairy.  If you have high blood pressure, you may need to limit your sodium intake or follow a diet such as the DASH eating plan. DASH is an eating plan that aims to lower high blood pressure. What foods are recommended? The items listed below may not be a complete list. Talk with your dietitian about what dietary choices are best for you. Grains Whole grains, such as whole-wheat or whole-grain breads, crackers, cereals, and pasta. Unsweetened oatmeal. Bulgur. Barley. Quinoa. Brown rice. Corn or whole-wheat flour tortillas or taco shells. Vegetables Lettuce. Spinach. Peas. Beets. Cauliflower. Cabbage. Broccoli. Carrots. Tomatoes. Squash. Eggplant. Herbs. Peppers. Onions. Cucumbers. Brussels sprouts. Fruits Berries. Bananas. Apples. Oranges. Grapes. Papaya. Mango. Pomegranate. Kiwi. Grapefruit. Cherries. Meats and other protein foods Seafood. Poultry without skin. Lean cuts of pork and beef. Tofu. Eggs. Nuts. Beans. Dairy Low-fat or  fat-free dairy products, such as yogurt, cottage cheese, and cheese. Beverages Water. Tea. Coffee. Sugar-free or diet soda. Seltzer water. Lowfat or no-fat milk. Milk alternatives, such as soy or almond milk. Fats and oils Olive oil. Canola oil. Sunflower oil. Grapeseed oil. Avocado. Walnuts. Sweets and desserts Sugar-free or low-fat pudding. Sugar-free or low-fat ice cream and other frozen treats. Seasoning and other foods Herbs. Sodium-free spices. Mustard. Relish. Low-fat, low-sugar ketchup. Low-fat, low-sugar barbecue sauce. Low-fat or fat-free mayonnaise. What foods are not recommended? The items listed below may not be a complete list. Talk with your dietitian about what dietary choices are best for you. Grains Refined white flour and flour products,  such as bread, pasta, snack foods, and cereals. Vegetables Canned vegetables. Frozen vegetables with butter or cream sauce. Fruits Fruits canned with syrup. Meats and other protein foods Fatty cuts of meat. Poultry with skin. Breaded or fried meat. Processed meats. Dairy Full-fat yogurt, cheese, or milk. Beverages Sweetened drinks, such as sweet iced tea and soda. Fats and oils Butter. Lard. Ghee. Sweets and desserts Baked goods, such as cake, cupcakes, pastries, cookies, and cheesecake. Seasoning and other foods Spice mixes with added salt. Ketchup. Barbecue sauce. Mayonnaise. Summary  To prevent diabetes from developing, you may need to make diet and other lifestyle changes to help control blood sugar, improve cholesterol levels, and manage your blood pressure.  Set weight loss goals with the help of your health care team. It is recommended that most people with prediabetes lose 7 percent of their current body weight.  Consider following a Mediterranean diet that includes plenty of fresh fruits and vegetables, whole grains, beans, nuts, seeds, fish, lean meat, low-fat dairy, and healthy oils. This information is not intended to replace advice given to you by your health care provider. Make sure you discuss any questions you have with your health care provider. Document Revised: 11/10/2018 Document Reviewed: 09/22/2016 Elsevier Patient Education  2020 [employer].

## 2019-10-15 ENCOUNTER — Telehealth: Payer: Self-pay | Admitting: Family

## 2019-10-15 DIAGNOSIS — F32A Depression, unspecified: Secondary | ICD-10-CM

## 2019-10-15 DIAGNOSIS — F329 Major depressive disorder, single episode, unspecified: Secondary | ICD-10-CM

## 2019-10-15 MED ORDER — LOSARTAN POTASSIUM 25 MG PO TABS: ORAL_TABLET | ORAL | 1 refills | Status: DC

## 2019-10-15 MED ORDER — BUPROPION HCL ER (XL) 300 MG PO TB24: ORAL_TABLET | ORAL | 1 refills | Status: DC

## 2019-10-15 MED ORDER — MELOXICAM 7.5 MG PO TABS: 7.5000 mg | ORAL_TABLET | Freq: Every day | ORAL | 1 refills | Status: DC | PRN

## 2019-10-15 MED ORDER — FLUOXETINE HCL 20 MG PO TABS: 20.0000 mg | ORAL_TABLET | Freq: Every day | ORAL | 0 refills | Status: DC

## 2019-10-15 MED ORDER — HYDROCHLOROTHIAZIDE 12.5 MG PO TABS: ORAL_TABLET | ORAL | 1 refills | Status: DC

## 2019-10-15 NOTE — Telephone Encounter (Signed)
Thanks so much! I called the patient and clarified the directions on the transition from escitalopram to fluoxetine. She has an appt scheduled with you for 11/13/19. Please let me know if I can assist with anything further!

## 2019-10-15 NOTE — Telephone Encounter (Signed)
-----   Message from [place], [facility] sent at 10/11/2019 10:50 AM EST ----- Regarding: Escitalopram switch to Fluoxetine, Scripts to [facility], Labs Needed Hello Jasmine Clayton!  I had the pleasure of seeing this patient today and we discussed quite a few things. I am sending my note shortly for attestation. The most notable changes/recommendations are as follows:   -Pt interested in switching from escitalopram to fluoxetine. She is still struggling with depression. Her PHQ9 today was 14. She has had good experience with fluoxetine in the past. This could also be helpful with getting her activated. Would you be open to her making the switch? She could do a direct conversion for escitalpram 20mg  to fluoxetine 20mg . I am following up with her in 2 weeks for BP and can also assist with this transition. If you are agreeable would you please send a new script to [facility] for:  Fluoxetine 20mg  Take 1 tablet by mouth once daily in the morning #30 Refills: As you see appropriate  -Patient has opted to receive her medications from [facility]. Could you please send new scripts to [facility] for medications for patient (She is going to have [facility] put the losartan and hctz on hold so that we can get them filled for her by [facility]. She will eventually be placed in our adherence packaging which will be beneficial for her).   Bupropion 300mg  XL Hctz 12.5mg  Losartan 25mg  Meloxicam 7.5mg    -Patient would benefit from having the following labs drawn at her next office visit (a1c, lipids)  Please let me know if I can assist with anything further!  Thanks! De Blanch, PharmD Clinical Pharmacist [facility] [phone]

## 2019-10-15 NOTE — Telephone Encounter (Signed)
Jasmine Clayton,  I [facility]ills [facility] including fluoxetine. Could you please instruct pt on the switch and let her know that I would like to see her back in 1 month for depression follow up?

## 2019-10-24 ENCOUNTER — Other Ambulatory Visit: Payer: Self-pay

## 2019-10-24 ENCOUNTER — Telehealth: Payer: Self-pay | Admitting: Pharmacist

## 2019-10-24 ENCOUNTER — Ambulatory Visit: Admitting: Pharmacist

## 2019-10-24 DIAGNOSIS — I10 Essential (primary) hypertension: Secondary | ICD-10-CM

## 2019-10-24 DIAGNOSIS — M1711 Unilateral primary osteoarthritis, right knee: Secondary | ICD-10-CM

## 2019-10-24 DIAGNOSIS — F32A Depression, unspecified: Secondary | ICD-10-CM

## 2019-10-24 DIAGNOSIS — F329 Major depressive disorder, single episode, unspecified: Secondary | ICD-10-CM

## 2019-10-24 NOTE — Chronic Care Management (AMB) (Signed)
Chronic Care Management Pharmacy  Name: Jasmine Clayton  MRN: [id number] DOB: Mar 28, 1951   Chief Complaint/ HPI  Jasmine Clayton,  69 y.o. , female presents for their Follow-Up CCM visit with the clinical pharmacist via telephone due to COVID-19 Pandemic.  PCP : Debbrah Alar, NP  Their chronic conditions include: Pre-DM, HTN, Depression, Osteoarthritis, Osteopenia, OSA, Insomnia  Office Visits: None since last CCM visit  Consult Visit: None since last CCM visit  Medications: Outpatient Encounter Medications as of 10/24/2019  Medication Sig  . aspirin 81 MG tablet Take 81 mg by mouth daily.    . B Complex Vitamins (B-COMPLEX/B-12 PO) Take 1 tablet by mouth daily.   Marland Kitchen buPROPion (WELLBUTRIN XL) 300 MG 24 hr tablet Take 1 tab by mouth once daily  . Calcium Carb-Cholecalciferol (CALCIUM-VITAMIN D3) 600-500 MG-UNIT CAPS Take 1 tablet by mouth 2 (two) times daily.  Marland Kitchen FLUoxetine (PROZAC) 20 MG tablet Take 1 tablet (20 mg total) by mouth daily.  . hydrochlorothiazide (HYDRODIURIL) 12.5 MG tablet TAKE 1 TABLET(12.5 MG) BY MOUTH DAILY  . losartan (COZAAR) 25 MG tablet TAKE 1 TABLET(25 MG) BY MOUTH DAILY  . meloxicam (MOBIC) 7.5 MG tablet Take 1 tablet (7.5 mg total) by mouth daily as needed for pain.  . Omega-3 Fatty Acids (FISH OIL) 1200 MG CAPS Take 1 capsule by mouth daily.   Facility-Administered Encounter Medications as of 10/24/2019  Medication  . sodium chloride 0.9 % injection 10 mL     Current Diagnosis/Assessment:  Goals Addressed            This Visit's Progress   . Increase meloxicam to 15mg  daily as needed for pain   On track   . Pharmacy Care Plan   On track    CARE PLAN ENTRY  Current Barriers:  . Chronic Disease Management support, education, and care coordination needs related to Pre-DM, HTN, Depression, Osteoarthritis, Osteopenia, OSA, Insomnia  Pharmacist Clinical Goal(s):  Marland Kitchen Over the next 90 days, patient will demonstrate Improved  medication adherence as evidenced by appropriate fill dates with the assistance of adherence packaging . BP goal < 140/90 . A1c goal <6.5% . LDL goal <100 . Stable bone density  . Complete a1c at next office visit . Complete lipid panel at next office visit  Interventions: . Comprehensive medication review performed. Marland Kitchen Purchase BP cuff and check BP 3-4 times per week . Increase meloxicam to 15mg  when needed to see if this helps further with pain . Take calcium/vitamin D daily to help with bone strength . Discontinue escitalopram and start fluoxetine  Patient Self Care Activities:  . Patient verbalizes understanding of plan to follow as described above, Self administers medications as prescribed, Calls pharmacy for medication refills, and Calls provider office for new concerns or questions  Initial goal documentation     . Purchase blood pressure cuff   On track   . Stop taking escitalopram and start taking fluoxetine 20mg  daily   On track      Hypertension   BP today is: Unable to assess due to phone visit  Office blood pressures are  BP Readings from Last 3 Encounters:  07/24/19 (!) 161/83  07/13/19 128/84  07/10/19 (!) 178/79    Patient has failed these meds in the past: None noted  Patient is currently uncontrolled on the following medications:  losartan 25mg  daily, hctz 12.5mg  daily  Patient checks BP at home infrequently  Patient purchased cuff and now needs to start checking BP regularly.  Will assess further at next follow up   Plan -Continue current medications     Osteoarthritis    Patient has failed these meds in past: None noted  Patient is currently uncontrolled on the following medications: meloxicam 7.5mg  daily (only uses about 3 times per week)  Feels the 15mg  is working better  We discussed:  Risk benefit of increasing meloxicam dose considering renal function  Plan -Continue meloxicam 15mg  as needed for pain  Depression   Patient  has failed these meds in past: None noted Patient is currently controlled on the following medications: fluoxetine 20mg  daily (changes from escitalopram at last CCM visit), bupropion 300mg  XL daily  Update Received fluoxetine. Feels some difference, but needs more time No adverse effects.  Plan  -Continue current medications

## 2019-10-24 NOTE — Telephone Encounter (Signed)
Patient was scheduled for a 2 week follow up visit today at 8:30am.  Called patient at 8:30am and left VM.  Called patient again at 4:25pm, but unable to leave VM at both numbers listed.  Will try to reach patient at future time.

## 2019-10-29 NOTE — Patient Instructions (Signed)
Visit Information  Goals Addressed            This Visit's Progress   . Increase meloxicam to 15mg  daily as needed for pain   On track   . Purchase blood pressure cuff   On track   . Stop taking escitalopram and start taking fluoxetine 20mg  daily   On track      The patient verbalized understanding of instructions provided today and agreed to receive a mailed copy of patient instruction and/or educational materials.  Telephone follow up appointment with pharmacy team member scheduled for: 11/27/2019  De Blanch, PharmD Clinical Pharmacist [facility] at [facility] [phone]

## 2019-11-06 ENCOUNTER — Telehealth: Payer: Self-pay

## 2019-11-06 ENCOUNTER — Other Ambulatory Visit: Payer: Self-pay | Admitting: Family

## 2019-11-06 NOTE — Telephone Encounter (Signed)
Pharmacy called stating that prescription was sent in for 30 day supply only, pharmacy requesting 90 day supply.

## 2019-11-06 NOTE — Telephone Encounter (Signed)
Please send 90 tabs.

## 2019-11-06 NOTE — Telephone Encounter (Signed)
Pharmacy called in to see if they can get a prescription order  FLUoxetine (PROZAC) 20 MG tablet [id number]   90 day supply   Please send it to [facility] - [place], [place] - [address]  [address], [place] [address]  Phone:  [phone] Fax:  [phone]  DEA #:  --

## 2019-11-07 ENCOUNTER — Telehealth: Payer: Self-pay | Admitting: Family

## 2019-11-07 MED ORDER — FLUOXETINE HCL 20 MG PO CAPS: ORAL_CAPSULE | ORAL | 0 refills | Status: DC

## 2019-11-07 NOTE — Telephone Encounter (Signed)
-----   Message from [place], [facility] sent at 11/06/2019 10:23 AM EDT ----- Regarding: Refill of Fluoxetine to [facility] (90 DS?) Hey Saheed Carrington!   I was finally able to reach this patient on 10/24/19 and she was tolerating fluoxetine well. We are working to get her medications synced. I realize we may need to titrate this dose in the future, but wondered if you would be willing to send in a 90 day supply of her current dose to [facility] so they can package the medication for her.   Please let me know if I can assist with anything further.   Thanks, De Blanch, PharmD Clinical Pharmacist [facility] at [facility] [place] [phone]

## 2019-11-07 NOTE — Telephone Encounter (Signed)
Rx has been sent. tks.

## 2019-11-12 ENCOUNTER — Other Ambulatory Visit: Payer: Self-pay

## 2019-11-13 ENCOUNTER — Ambulatory Visit: Payer: PPO | Admitting: Family

## 2019-11-13 ENCOUNTER — Encounter: Payer: Self-pay | Admitting: Family

## 2019-11-13 ENCOUNTER — Ambulatory Visit (INDEPENDENT_AMBULATORY_CARE_PROVIDER_SITE_OTHER): Payer: Medicare HMO | Admitting: Family

## 2019-11-13 VITALS — BP 153/79 | HR 78 | Temp 97.1°F | Resp 16 | Ht 60.0 in | Wt 236.0 lb

## 2019-11-13 DIAGNOSIS — I10 Essential (primary) hypertension: Secondary | ICD-10-CM | POA: Diagnosis not present

## 2019-11-13 MED ORDER — LOSARTAN POTASSIUM 50 MG PO TABS: 50.0000 mg | ORAL_TABLET | Freq: Every day | ORAL | 1 refills | Status: DC

## 2019-11-13 NOTE — Progress Notes (Signed)
Subjective:    Patient ID: Jasmine Clayton, female    DOB: 1951-03-10, 69 y.o.   MRN: [id number]  HPI  Patient is a 69 yr old female who presents today for follow up.   Maintained on losartan 25mg  and hctz 12.5mg .  BP Readings from Last 3 Encounters:  11/13/19 (!) 153/79  07/24/19 (!) 161/83  07/13/19 128/84    Reports that she has had some low back pain with pain radiating down the back of the right buttock. Using meloxicam.    Review of Systems    see HPI  Past Medical History:  Diagnosis Date  . Adenocarcinoma of colon ([place]) 04/12/2011   "rectal-colon"  . Corneal ulcers and infections 07/04/12  . Degenerative joint disease    right knee  . DVT (deep vein thrombosis) in pregnancy 1979   during pregnancy. lower extremity  . Family history of colon cancer   . Family history of uterine cancer   . History of chemotherapy     cycle 5 Folfox completed 09/23/11  . History of radiation therapy 10/25/11 -12/01/11   pelvis/presacral region  . Morbid obesity ([place])   . OSA (obstructive sleep apnea) 07/04/2018   Severe per split night study 11/19  Trial of CPAP therapy on 12 cm H2O with a Wide size Resmed Nasal Mask AirFit N10 mask and heated humidification.  . Osteopenia   . 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 Stress incontinence, female    not current     Social History   Socioeconomic History  . Marital status: Widowed    Spouse name: Not on file  . Number of children: 1  . Years of education: Not on file  . Highest education level: Not on file  Occupational History  . 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 Smoking status: Never Smoker  . Smokeless tobacco: Never Used  Substance and Sexual Activity  . Alcohol use: Yes    Alcohol/week: 0.0 standard drinks    Comment: occ  . Drug use: No  . Sexual activity: Not on file  Other Topics Concern  . Not on file   Social History Narrative   Regular exercise:  No   Caffeine Use:  1 cup coffee daily   Husband passed 2/17   Daughter, son-in-law and her 2 children- they recently moved out   Completed the 12th grade, and some college.   Does not work.    Social Determinants of Health   Financial Resource Strain:   . Difficulty of Paying Living Expenses:   Food Insecurity:   . Worried About Charity fundraiser in the Last Year:   . Arboriculturist in the Last Year:   Transportation Needs:   . Film/video editor (Medical):   Marland Kitchen Lack of Transportation (Non-Medical):   Physical Activity:   . Days of Exercise per Week:   . Minutes of Exercise per Session:   Stress:   . Feeling of Stress :   Social Connections:   . Frequency of Communication with Friends and Family:   . Frequency of Social Gatherings with Friends and Family:   . Attends Religious Services:   . Active Member of Clubs or Organizations:   . Attends Archivist Meetings:   Marland Kitchen Marital Status:   Intimate Partner Violence:   . Fear of Current or Ex-Partner:   . Emotionally  Abused:   Marland Kitchen Physically Abused:   . Sexually Abused:     Past Surgical History:  Procedure Laterality Date  . ABDOMINAL HYSTERECTOMY  3 years ago  . BREAST CYST EXCISION Right 1978  . CESAREAN SECTION    . COLON SURGERY     LAR, reoperation for necrotic ileostomy, transverse colostomy  . COLOSTOMY  05/27/11  . COLOSTOMY TAKEDOWN  07/11/2012   Procedure: COLOSTOMY TAKEDOWN;  Surgeon: Rolm Bookbinder, MD;  Location: [facility];  Service: General;  Laterality: N/A;  colostomy takedown  . JOINT REPLACEMENT  2002   hip replacement  . [place]  . MASS EXCISION  2011   "mass removed from ovaries"  . PORT-A-CATH REMOVAL  07/11/2012   Procedure: REMOVAL PORT-A-CATH;  Surgeon: Rolm Bookbinder, MD;  Location: [facility];  Service: General;  Laterality: N/A;  removal portacath  . PORTACATH PLACEMENT  07/26/11   right portacath  . TOTAL  ABDOMINAL HYSTERECTOMY W/ BILATERAL SALPINGOOPHORECTOMY  11/30/09   BSO  . TOTAL HIP ARTHROPLASTY  2002   left hip    Family History  Problem Relation Age of Onset  . Uterine cancer Mother 4       deceased 46  . Diabetes Father   . Colon cancer Sister 78       currently 3  . Alcoholism Brother     No Known Allergies  Current Outpatient Medications on File Prior to Visit  Medication Sig Dispense Refill  . aspirin 81 MG tablet Take 81 mg by mouth daily.      . B Complex Vitamins (B-COMPLEX/B-12 PO) Take 1 tablet by mouth daily.     Marland Kitchen buPROPion (WELLBUTRIN XL) 300 MG 24 hr tablet Take 1 tab by mouth once daily 90 tablet 1  . Calcium Carb-Cholecalciferol (CALCIUM-VITAMIN D3) 600-500 MG-UNIT CAPS Take 1 tablet by mouth 2 (two) times daily. 60 capsule 0  . FLUoxetine (PROZAC) 20 MG capsule TAKE ONE CAPSULE BY MOUTH WITH BREAKFAST EVERY DAY 90 capsule 0  . hydrochlorothiazide (HYDRODIURIL) 12.5 MG tablet TAKE 1 TABLET(12.5 MG) BY MOUTH DAILY 90 tablet 1  . losartan (COZAAR) 25 MG tablet TAKE 1 TABLET(25 MG) BY MOUTH DAILY 90 tablet 1  . meloxicam (MOBIC) 7.5 MG tablet Take 1 tablet (7.5 mg total) by mouth daily as needed for pain. 90 tablet 1  . Omega-3 Fatty Acids (FISH OIL) 1200 MG CAPS Take 1 capsule by mouth daily.     Current Facility-Administered Medications on File Prior to Visit  Medication Dose Route Frequency Provider Last Rate Last Admin  . sodium chloride 0.9 % injection 10 mL  10 mL Intravenous PRN [name], MD   10 mL at 05/25/12 1153    BP (!) 153/79 (BP Location: Left Arm, Patient Position: Sitting, Cuff Size: Large)   Pulse 78   Temp (!) 97.1 F (36.2 C) (Temporal)   Resp 16   Ht 5' (1.524 m)   Wt 236 lb (107 kg)   LMP 11/30/2009   SpO2 98%   BMI 46.09 kg/m    Objective:   Physical Exam Constitutional:      Appearance: She is well-developed.  Neck:     Thyroid: No thyromegaly.  Cardiovascular:     Rate and Rhythm: Normal rate and regular  rhythm.     Heart sounds: Normal heart sounds. No murmur.  Pulmonary:     Effort: Pulmonary effort is normal. No respiratory distress.     Breath sounds: Normal breath sounds.  No wheezing.  Musculoskeletal:     Cervical back: Neck supple.  Skin:    General: Skin is warm and dry.  Neurological:     Mental Status: She is alert and oriented to person, place, and time.  Psychiatric:        Behavior: Behavior normal.        Thought Content: Thought content normal.        Judgment: Judgment normal.           Assessment & Plan:  HTN- bp slightly improved but above goal. Advised pt as follows:  Please increase losartan from 25mg  to 50mg  once daily.

## 2019-11-13 NOTE — Patient Instructions (Signed)
Please increase losartan from 25mg  to 50mg  once daily.

## 2019-11-27 ENCOUNTER — Other Ambulatory Visit: Payer: Self-pay

## 2019-11-27 ENCOUNTER — Ambulatory Visit: Payer: Medicare HMO | Admitting: Pharmacist

## 2019-11-27 NOTE — Chronic Care Management (AMB) (Signed)
Chronic Care Management Pharmacy  Name: Jasmine Clayton  MRN: [id number] DOB: 04-Sep-1950   Chief Complaint/ HPI  Jasmine Clayton,  69 y.o. , female presents for their Follow-Up CCM visit with the clinical pharmacist via telephone due to COVID-19 Pandemic.  PCP : Debbrah Alar, NP  Their chronic conditions include: Pre-DM, HTN, Depression, Osteoarthritis, Osteopenia, OSA, Insomnia  Office Visits: 11/13/19: Visit w/ Debbrah Alar, NP - BP above goal. Losartan increased to 50mg  daily.  Consult Visit: None since last CCM visit  Medications: Outpatient Encounter Medications as of 11/27/2019  Medication Sig  . aspirin 81 MG tablet Take 81 mg by mouth daily.    . B Complex Vitamins (B-COMPLEX/B-12 PO) Take 1 tablet by mouth daily.   Marland Kitchen buPROPion (WELLBUTRIN XL) 300 MG 24 hr tablet Take 1 tab by mouth once daily  . Calcium Carb-Cholecalciferol (CALCIUM-VITAMIN D3) 600-500 MG-UNIT CAPS Take 1 tablet by mouth 2 (two) times daily.  Marland Kitchen FLUoxetine (PROZAC) 20 MG capsule TAKE ONE CAPSULE BY MOUTH WITH BREAKFAST EVERY Dariusz Brase  . hydrochlorothiazide (HYDRODIURIL) 12.5 MG tablet TAKE 1 TABLET(12.5 MG) BY MOUTH DAILY  . losartan (COZAAR) 50 MG tablet Take 1 tablet (50 mg total) by mouth daily.  . Omega-3 Fatty Acids (FISH OIL) 1200 MG CAPS Take 1 capsule by mouth daily.  . [DISCONTINUED] meloxicam (MOBIC) 7.5 MG tablet Take 1 tablet (7.5 mg total) by mouth daily as needed for pain.   Facility-Administered Encounter Medications as of 11/27/2019  Medication  . sodium chloride 0.9 % injection 10 mL     Current Diagnosis/Assessment:  Goals Addressed            This Visit's Progress   . Hypertension: Blood pressure goal less than 140/90       CARE PLAN ENTRY (see longitudinal plan of care for additional care plan information)  Current Barriers:  . Uncontrolled hypertension, complicated by depression and osteoarthritis  . Current antihypertensive regimen: losartan 50mg  daily  (increased from 25mg  on 11/13/19), hctz 12.5mg  daily . Previous antihypertensives tried: None noted  . Last practice recorded BP readings:  BP Readings from Last 3 Encounters:  11/13/19 (!) 153/79  07/24/19 (!) 161/83  07/13/19 128/84 .   Marland Kitchen Current home BP readings: N/A . Most recent GFR: 63.88 mL/min (07/24/19)  . Pharmacist Clinical Goal(s):  Marland Kitchen Over the next 90 days, patient will work with PharmD and providers to optimize antihypertensive regimen . Over the next 90 days, pt's BP will be less than 140/90  Interventions: . Inter-disciplinary care team collaboration (see longitudinal plan of care) . Comprehensive medication review performed; medication list updated in the electronic medical record.  Marland Kitchen Purchase new BP cuff to record readings  Patient Self Care Activities:  . Over next 90 days patient will continue to check BP 3-4 times per week, document, and provide at future appointments . Patient will focus on medication adherence by filling HTN meds appropriately  Initial goal documentation     . Pharmacy Care Plan       CARE PLAN ENTRY  Current Barriers:  . Chronic Disease Management support, education, and care coordination needs related to Pre-DM, HTN, Depression, Osteoarthritis, Osteopenia, OSA, Insomnia  Pharmacist Clinical Goal(s):  Marland Kitchen Over the next 90 days, patient will demonstrate Improved medication adherence as evidenced by appropriate fill dates with the assistance of adherence packaging . BP goal < 140/90 . A1c goal <6.5% . LDL goal <100 . Stable bone density  . Complete a1c at next office visit .  Complete lipid panel at next office visit  Interventions: . Comprehensive medication review performed. Marland Kitchen Purchase BP cuff and check BP 3-4 times per week . Increase meloxicam to 15mg  when needed to see if this helps further with pain (completed) . Take calcium/vitamin D daily to help with bone strength . Discontinue escitalopram and start fluoxetine  (completed)  Patient Self Care Activities:  . Patient verbalizes understanding of plan to follow as described above, Self administers medications as prescribed, Calls pharmacy for medication refills, and Calls provider office for new concerns or questions  Initial goal documentation     . COMPLETED: Stop taking escitalopram and start taking fluoxetine 20mg  daily         Hypertension   CMP Latest Ref Rng & Units 07/24/2019 06/15/2019 05/15/2019  Glucose 70 - 99 mg/dL 87 117(H) 92  BUN 6 - 23 mg/dL 15 16 17   Creatinine 0.40 - 1.20 mg/dL 1.04 1.18 1.32(H)  Sodium 135 - 145 mEq/L 139 143 138  Potassium 3.5 - 5.1 mEq/L 3.6 3.7 3.8  Chloride 96 - 112 mEq/L 101 106 99  CO2 19 - 32 mEq/L 30 27 28   Calcium 8.4 - 10.5 mg/dL 9.4 9.1 9.8  Total Protein 6.0 - 8.3 g/dL - - -  Total Bilirubin 0.2 - 1.2 mg/dL - - -  Alkaline Phos 39 - 117 U/L - - -  AST 0 - 37 U/L - - -  ALT 0 - 35 U/L - - -  GFR      63.88   55.23   48.54   BP today is: Unable to assess due to phone visit  Office blood pressures are  BP Readings from Last 3 Encounters:  11/13/19 (!) 153/79  07/24/19 (!) 161/83  07/13/19 128/84    Patient has failed these meds in the past: None noted  Patient is currently uncontrolled on the following medications:  losartan 50mg  daily (increased from 25mg  on 11/13/19), hctz 12.5mg  daily  Patient checks BP at home infrequently  Patient purchased cuff and now needs to start checking BP regularly.  Will assess further at next follow up  Update BP cuff is too small. Was going to take it back, but threw the whole cuff away on accident.  She states she will start using wrist cuff from her sister. We discussed that the wrist cuff may not be accurate    Plan -Purchase new blood pressure cuff with appropriate sized cuff -Continue current medications     Osteoarthritis    Patient has failed these meds in past: None noted  Patient is currently uncontrolled on the following  medications: meloxicam 7.5mg  daily (only uses about 3 times per week)  Feels the 15mg  is working better  We discussed:  Risk benefit of increasing meloxicam dose considering renal function  Plan -Continue meloxicam 15mg  as needed for pain  Depression   Patient has failed these meds in past: None noted Patient is currently controlled on the following medications: fluoxetine 20mg  daily (changed from escitalopram at last CCM visit), bupropion 300mg  XL daily  Patient states fluoxetine is working well.  PHQ9 = 6 No adverse effects.  Plan  -Continue current medications   Vaccines   Reviewed and discussed patient's vaccination history.    Immunization History  Administered Date(s) Administered  . Fluad Quad(high Dose 65+) 05/15/2019  . Influenza Split 08/28/2011  . Influenza, High Dose Seasonal PF 05/04/2017, 04/11/2018  . Influenza,inj,Quad PF,6+ Mos 06/04/2013, 04/05/2014, 05/26/2015, 04/23/2016  . Pneumococcal Conjugate-13 01/09/2018  .  Pneumococcal Polysaccharide-23 07/17/2014, 07/24/2019  . Tdap 03/15/2011, 07/02/2019  . Zoster 02/16/2013    Plan Recommended patient receive Shingrix vaccine in pharmacy.

## 2019-11-28 ENCOUNTER — Encounter: Payer: Self-pay | Admitting: Family

## 2019-11-28 ENCOUNTER — Ambulatory Visit (INDEPENDENT_AMBULATORY_CARE_PROVIDER_SITE_OTHER): Payer: Medicare HMO | Admitting: Family

## 2019-11-28 ENCOUNTER — Other Ambulatory Visit: Payer: Self-pay

## 2019-11-28 VITALS — BP 144/81 | HR 80 | Temp 97.3°F | Resp 16 | Ht 60.0 in | Wt 231.0 lb

## 2019-11-28 DIAGNOSIS — I10 Essential (primary) hypertension: Secondary | ICD-10-CM | POA: Diagnosis not present

## 2019-11-28 DIAGNOSIS — M5416 Radiculopathy, lumbar region: Secondary | ICD-10-CM | POA: Diagnosis not present

## 2019-11-28 LAB — BASIC METABOLIC PANEL
BUN: 18 mg/dL (ref 6–23)
CO2: 30 mEq/L (ref 19–32)
Calcium: 9.2 mg/dL (ref 8.4–10.5)
Chloride: 103 mEq/L (ref 96–112)
Creatinine, Ser: 1.32 mg/dL — ABNORMAL HIGH (ref 0.40–1.20)
GFR: 48.46 mL/min — ABNORMAL LOW (ref >60.00)
Glucose, Bld: 101 mg/dL — ABNORMAL HIGH (ref 70–99)
Potassium: 3.6 mEq/L (ref 3.5–5.1)
Sodium: 140 mEq/L (ref 135–145)

## 2019-11-28 NOTE — Patient Instructions (Addendum)
You should be contacted about scheduling your appointment with orthopedics.  Please continue current dose of losartan for your blood pressure. Complete lab work prior to leaving.

## 2019-11-29 ENCOUNTER — Telehealth: Payer: Self-pay | Admitting: Family

## 2019-11-29 NOTE — Telephone Encounter (Signed)
Please advise pt that her lab work shows that meloxicam is stressing her kidneys.  Please d/c meloxicam and repeat bmet in 2 weeks, dx renal insufficiency.

## 2019-11-29 NOTE — Progress Notes (Signed)
Subjective:    Patient ID: Jasmine Clayton, female    DOB: 12-07-1950, 69 y.o.   MRN: [id number]  HPI      Subjective  Patient ID: Jasmine Clayton, female DOB: [birth year], 69 y.o. MRN: [id number]  HPI  Patient is a 69 yr old female who presents today for follow up.  HTN-Last visit we increased losartan from 25mg  to 50mg .     BP Readings from Last 3 Encounters:  11/28/19 (!) 144/81  11/13/19 (!) 153/79  07/24/19 (!) 161/83   Leg pain- reports chronic knee. Pain is sharp in the buttock which radiates down the back of the right knee. She has a hx of left hip replacement. Not worsened by weight bearing. She denies weakness/numbness. Denies any bowel incontinence. Has some chronic urinary urgency which is at baseline.  Has tried tylenol for the pain. No significant improvement in the pain.  Review of Systems  See HPI      Past Medical History:  Diagnosis Date  . Adenocarcinoma of colon ([place]) 04/12/2011   "rectal-colon"  . Corneal ulcers and infections 07/04/12  . Degenerative joint disease    right knee  . DVT (deep vein thrombosis) in pregnancy 1979   during pregnancy. lower extremity  . Family history of colon cancer   . Family history of uterine cancer   . History of chemotherapy    cycle 5 Folfox completed 09/23/11  . History of radiation therapy 10/25/11 -12/01/11   pelvis/presacral region  . Morbid obesity ([place])   . OSA (obstructive sleep apnea) 07/04/2018   Severe per split night study 11/19 Trial of CPAP therapy on 12 cm H2O with a Wide size Resmed Nasal Mask AirFit N10 mask and heated humidification.  . Osteopenia   . 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 Stress incontinence, female    not current   Social History        Socioeconomic History  . Marital status: Widowed    Spouse name: Not on file  . Number of children: 1  . Years of education: Not on file  . Highest education level: Not on file   Occupational History  . 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 Smoking status: Never Smoker  . Smokeless tobacco: Never Used  Substance and Sexual Activity  . Alcohol use: Yes    Alcohol/week: 0.0 standard drinks    Comment: occ  . Drug use: No  . Sexual activity: Not on file  Other Topics Concern  . Not on file  Social History Narrative   Regular exercise: No   Caffeine Use: 1 cup coffee daily   Husband passed 2/17   Daughter, son-in-law and her 2 children- they recently moved out   Completed the 12th grade, and some college.   Does not work.    Social Determinants of Health      Financial Resource Strain:   . Difficulty of Paying Living Expenses:   Food Insecurity:   . Worried About Charity fundraiser in the Last Year:   . Arboriculturist in the Last Year:   Transportation Needs:   . Film/video editor (Medical):   Marland Kitchen Lack of Transportation (Non-Medical):   Physical Activity:   . Days of Exercise per Week:   . Minutes of Exercise per Session:   Stress:   . Feeling of Stress :  Social Connections:   . Frequency of Communication with Friends and Family:   . Frequency of Social Gatherings with Friends and Family:   . Attends Religious Services:   . Active Member of Clubs or Organizations:   . Attends Archivist Meetings:   Marland Kitchen Marital Status:   Intimate Partner Violence:   . Fear of Current or Ex-Partner:   . Emotionally Abused:   Marland Kitchen Physically Abused:   . Sexually Abused:         Past Surgical History:  Procedure Laterality Date  . ABDOMINAL HYSTERECTOMY  3 years ago  . BREAST CYST EXCISION Right 1978  . CESAREAN SECTION    . COLON SURGERY     LAR, reoperation for necrotic ileostomy, transverse colostomy  . COLOSTOMY  05/27/11  . COLOSTOMY TAKEDOWN  07/11/2012   Procedure: COLOSTOMY TAKEDOWN; Surgeon: Rolm Bookbinder, MD; Location: [facility]; Service: General; Laterality: N/A; colostomy takedown  . JOINT  REPLACEMENT  2002   hip replacement  . [place]  . MASS EXCISION  2011   "mass removed from ovaries"  . PORT-A-CATH REMOVAL  07/11/2012   Procedure: REMOVAL PORT-A-CATH; Surgeon: Rolm Bookbinder, MD; Location: [facility]; Service: General; Laterality: N/A; removal portacath  . PORTACATH PLACEMENT  07/26/11   right portacath  . TOTAL ABDOMINAL HYSTERECTOMY W/ BILATERAL SALPINGOOPHORECTOMY  11/30/09   BSO  . TOTAL HIP ARTHROPLASTY  2002   left hip        Family History  Problem Relation Age of Onset  . Uterine cancer Mother 14   deceased 4  . Diabetes Father   . Colon cancer Sister 55   currently 52  . Alcoholism Brother    No Known Allergies        Current Outpatient Medications on File Prior to Visit  Medication Sig Dispense Refill  . aspirin 81 MG tablet Take 81 mg by mouth daily.     . B Complex Vitamins (B-COMPLEX/B-12 PO) Take 1 tablet by mouth daily.     Marland Kitchen buPROPion (WELLBUTRIN XL) 300 MG 24 hr tablet Take 1 tab by mouth once daily 90 tablet 1  . Calcium Carb-Cholecalciferol (CALCIUM-VITAMIN D3) 600-500 MG-UNIT CAPS Take 1 tablet by mouth 2 (two) times daily. 60 capsule 0  . FLUoxetine (PROZAC) 20 MG capsule TAKE ONE CAPSULE BY MOUTH WITH BREAKFAST EVERY DAY 90 capsule 0  . hydrochlorothiazide (HYDRODIURIL) 12.5 MG tablet TAKE 1 TABLET(12.5 MG) BY MOUTH DAILY 90 tablet 1  . losartan (COZAAR) 50 MG tablet Take 1 tablet (50 mg total) by mouth daily. 90 tablet 1  . meloxicam (MOBIC) 7.5 MG tablet Take 1 tablet (7.5 mg total) by mouth daily as needed for pain. 90 tablet 1  . Omega-3 Fatty Acids (FISH OIL) 1200 MG CAPS Take 1 capsule by mouth daily.              Current Facility-Administered Medications on File Prior to Visit  Medication Dose Route Frequency Provider Last Rate Last Admin  . sodium chloride 0.9 % injection 10 mL 10 mL Intravenous PRN [name], MD  10 mL at 05/25/12 1153   BP (!) 144/81 (BP Location: Right Arm, Patient Position: Sitting,  Cuff Size: Large)  Pulse 80  Temp (!) 97.3 F (36.3 C) (Temporal)  Resp 16  Ht 5' (1.524 m)  Wt 231 lb (104.8 kg)  LMP 11/30/2009  SpO2 99%  BMI 45.11 kg/m      Review of Systems    see HPI  Past Medical History:  Diagnosis Date  . Adenocarcinoma of colon ([place]) 04/12/2011   "rectal-colon"  . Corneal ulcers and infections 07/04/12  . Degenerative joint disease    right knee  . DVT (deep vein thrombosis) in pregnancy 1979   during pregnancy. lower extremity  . Family history of colon cancer   . Family history of uterine cancer   . History of chemotherapy     cycle 5 Folfox completed 09/23/11  . History of radiation therapy 10/25/11 -12/01/11   pelvis/presacral region  . Morbid obesity ([place])   . OSA (obstructive sleep apnea) 07/04/2018   Severe per split night study 11/19  Trial of CPAP therapy on 12 cm H2O with a Wide size Resmed Nasal Mask AirFit N10 mask and heated humidification.  . Osteopenia   . 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 Stress incontinence, female    not current     Social History   Socioeconomic History  . Marital status: Widowed    Spouse name: Not on file  . Number of children: 1  . Years of education: Not on file  . Highest education level: Not on file  Occupational History  . 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 Smoking status: Never Smoker  . Smokeless tobacco: Never Used  Substance and Sexual Activity  . Alcohol use: Yes    Alcohol/week: 0.0 standard drinks    Comment: occ  . Drug use: No  . Sexual activity: Not on file  Other Topics Concern  . Not on file  Social History Narrative   Regular exercise:  No   Caffeine Use:  1 cup coffee daily   Husband passed 2/17   Daughter, son-in-law and her 2 children- they recently moved out   Completed the 12th grade, and some college.   Does not work.    Social Determinants of Health    Financial Resource Strain:   . Difficulty of Paying Living Expenses:   Food Insecurity:   . Worried About Charity fundraiser in the Last Year:   . Arboriculturist in the Last Year:   Transportation Needs:   . Film/video editor (Medical):   Marland Kitchen Lack of Transportation (Non-Medical):   Physical Activity:   . Days of Exercise per Week:   . Minutes of Exercise per Session:   Stress:   . Feeling of Stress :   Social Connections:   . Frequency of Communication with Friends and Family:   . Frequency of Social Gatherings with Friends and Family:   . Attends Religious Services:   . Active Member of Clubs or Organizations:   . Attends Archivist Meetings:   Marland Kitchen Marital Status:   Intimate Partner Violence:   . Fear of Current or Ex-Partner:   . Emotionally Abused:   Marland Kitchen Physically Abused:   . Sexually Abused:     Past Surgical History:  Procedure Laterality Date  . ABDOMINAL HYSTERECTOMY  3 years ago  . BREAST CYST EXCISION Right 1978  . CESAREAN SECTION    . COLON SURGERY     LAR, reoperation for necrotic ileostomy, transverse colostomy  . COLOSTOMY  05/27/11  . COLOSTOMY TAKEDOWN  07/11/2012   Procedure: COLOSTOMY TAKEDOWN;  Surgeon: Rolm Bookbinder, MD;  Location: [facility];  Service: General;  Laterality: N/A;  colostomy takedown  . JOINT REPLACEMENT  2002   hip  replacement  . [place]  . MASS EXCISION  2011   "mass removed from ovaries"  . PORT-A-CATH REMOVAL  07/11/2012   Procedure: REMOVAL PORT-A-CATH;  Surgeon: Rolm Bookbinder, MD;  Location: [facility];  Service: General;  Laterality: N/A;  removal portacath  . PORTACATH PLACEMENT  07/26/11   right portacath  . TOTAL ABDOMINAL HYSTERECTOMY W/ BILATERAL SALPINGOOPHORECTOMY  11/30/09   BSO  . TOTAL HIP ARTHROPLASTY  2002   left hip    Family History  Problem Relation Age of Onset  . Uterine cancer Mother 83       deceased 58  . Diabetes Father   . Colon cancer Sister 64       currently 63  .  Alcoholism Brother     No Known Allergies  Current Outpatient Medications on File Prior to Visit  Medication Sig Dispense Refill  . aspirin 81 MG tablet Take 81 mg by mouth daily.      . B Complex Vitamins (B-COMPLEX/B-12 PO) Take 1 tablet by mouth daily.     Marland Kitchen buPROPion (WELLBUTRIN XL) 300 MG 24 hr tablet Take 1 tab by mouth once daily 90 tablet 1  . Calcium Carb-Cholecalciferol (CALCIUM-VITAMIN D3) 600-500 MG-UNIT CAPS Take 1 tablet by mouth 2 (two) times daily. 60 capsule 0  . FLUoxetine (PROZAC) 20 MG capsule TAKE ONE CAPSULE BY MOUTH WITH BREAKFAST EVERY DAY 90 capsule 0  . hydrochlorothiazide (HYDRODIURIL) 12.5 MG tablet TAKE 1 TABLET(12.5 MG) BY MOUTH DAILY 90 tablet 1  . losartan (COZAAR) 50 MG tablet Take 1 tablet (50 mg total) by mouth daily. 90 tablet 1  . Omega-3 Fatty Acids (FISH OIL) 1200 MG CAPS Take 1 capsule by mouth daily.     Current Facility-Administered Medications on File Prior to Visit  Medication Dose Route Frequency Provider Last Rate Last Admin  . sodium chloride 0.9 % injection 10 mL  10 mL Intravenous PRN [name], MD   10 mL at 05/25/12 1153    BP (!) 144/81 (BP Location: Right Arm, Patient Position: Sitting, Cuff Size: Large)   Pulse 80   Temp (!) 97.3 F (36.3 C) (Temporal)   Resp 16   Ht 5' (1.524 m)   Wt 231 lb (104.8 kg)   LMP 11/30/2009   SpO2 99%   BMI 45.11 kg/m    Objective:   Physical Exam Constitutional:      Appearance: She is well-developed.  Neck:     Thyroid: No thyromegaly.  Cardiovascular:     Rate and Rhythm: Normal rate and regular rhythm.     Heart sounds: Normal heart sounds. No murmur.  Pulmonary:     Effort: Pulmonary effort is normal. No respiratory distress.     Breath sounds: Normal breath sounds. No wheezing.  Musculoskeletal:     Cervical back: Neck supple.     Comments: Bilateral LE strength is 5/5   Skin:    General: Skin is warm and dry.  Neurological:     Mental Status: She is alert and oriented  to person, place, and time.     Deep Tendon Reflexes:     Reflex Scores:      Patellar reflexes are 2+ on the right side and 2+ on the left side. Psychiatric:        Behavior: Behavior normal.        Thought Content: Thought content normal.        Judgment: Judgment normal.  Assessment & Plan:  Lumbar radiculopathy- she states she has previously done PT. She is currently taking meloxicam 15mg /day. I have advised her to drop down to 7.5mg  and use tylenol as needed for pain. Will refer to orthopedics.  HTN- bp is acceptable. Continue current meds.  This visit occurred during the SARS-CoV-2 public health emergency.  Safety protocols were in place, including screening questions prior to the visit, additional usage of staff PPE, and extensive cleaning of exam room while observing appropriate contact time as indicated for disinfecting solutions.

## 2019-11-29 NOTE — Telephone Encounter (Signed)
Lvm for patient to call back about her results 

## 2019-12-03 ENCOUNTER — Other Ambulatory Visit: Payer: Self-pay

## 2019-12-03 DIAGNOSIS — I10 Essential (primary) hypertension: Secondary | ICD-10-CM

## 2019-12-03 NOTE — Telephone Encounter (Signed)
Patient advised of results and provider's advised. She verbalized understanding. She was scheduled for labs 12-17-19

## 2019-12-05 ENCOUNTER — Ambulatory Visit (INDEPENDENT_AMBULATORY_CARE_PROVIDER_SITE_OTHER): Payer: Medicare HMO | Admitting: Family Medicine

## 2019-12-05 ENCOUNTER — Encounter: Payer: Self-pay | Admitting: Family Medicine

## 2019-12-05 ENCOUNTER — Other Ambulatory Visit: Payer: Self-pay

## 2019-12-05 DIAGNOSIS — M5441 Lumbago with sciatica, right side: Secondary | ICD-10-CM | POA: Diagnosis not present

## 2019-12-05 MED ORDER — GABAPENTIN 100 MG PO CAPS: ORAL_CAPSULE | ORAL | 3 refills | Status: DC

## 2019-12-05 MED ORDER — BACLOFEN 10 MG PO TABS: 5.0000 mg | ORAL_TABLET | Freq: Three times a day (TID) | ORAL | 3 refills | Status: DC | PRN

## 2019-12-05 NOTE — Progress Notes (Signed)
Office Visit Note   Patient: Jasmine Clayton           Date of Birth: Mar 19, 1951           MRN: [id number] Visit Date: 12/05/2019 Requested by: Debbrah Alar, NP [address] [place],  [place] [address] PCP: Debbrah Alar, NP  Subjective: Chief Complaint  Patient presents with  . Lower Back - Pain    Pain right lower back, radiating down posterior leg to foot x 3 wks. Occ numbness in the rt foot. H/o intermittent low back pain. When lying in bed at night, it "feels like the hip locks up" and she has to sit up and hang the rt leg down to relieve pain.    HPI: She is here with low back and right leg pain.  Symptoms started about 3 or 4 weeks ago, no injury.  Gradual onset of pain in the lower back with occasional radiation down the right leg to the foot.  She gets some numbness in the foot intermittently.  She cannot seem to find a comfortable position.  Sometimes the posterior hip feels like it is going to lock up when lying in bed at night.  She had troubles with her back over the years.  She has been to physical therapy and it did not help.  She had x-rays in 2018 showing spondylosis.  She does not know whether she has had an MRI scan.  She had colon cancer surgery and has some occasional bowel incontinence related to that.  She also has some bladder leakage intermittently, but she does not think she has anything directly related to her current back pain.  No fevers or chills.  She had to stop taking meloxicam recently because of kidney issues.              ROS:   All other systems were reviewed and are negative.  Objective: Vital Signs: LMP 11/30/2009   Physical Exam:  General:  Alert and oriented, in no acute distress. Pulm:  Breathing unlabored. Psy:  Normal mood, congruent affect. Skin: No visible rash. Low back: She is tender primarily in the right gluteus medius area and sciatic notch.  No significant bony tenderness over the spine.  Straight leg  raises negative, lower extremity strength and reflexes are normal.  No pain with internal hip rotation.  Imaging: No results found.  Assessment & Plan: 1.  Low back pain with right-sided sciatica -We will proceed with MRI scan followed by possible epidural injection versus chiropractic depending on the findings. -Prescriptions for baclofen and gabapentin.     Procedures: No procedures performed  No notes on file     PMFS History: Patient Active Problem List   Diagnosis Date Noted  . OSA (obstructive sleep apnea) 07/04/2018  . 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 Osteopenia   . Depression 08/25/2016  . Combined forms of age-related cataract of both eyes 06/21/2016  . Hyperopia of both eyes with astigmatism and presbyopia 06/21/2016  . Meibomian gland dysfunction 06/21/2016  . Vitreous syneresis of both eyes 06/21/2016  . Insomnia 11/28/2015  . Family history of uterine cancer   . Vaginal dryness, menopausal 12/12/2014  . Hyperglycemia 01/07/2014  . Osteoarthritis, knee 02/16/2013  . 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 Routine general medical examination at a health care facility 03/27/2012  . Adenocarcinoma of colon ([place]) 04/12/2011  . Morbid obesity ([place]) 02/10/2011  . HTN (hypertension) 02/10/2011  Past Medical History:  Diagnosis Date  . Adenocarcinoma of colon ([place]) 04/12/2011   "rectal-colon"  . Corneal ulcers and infections 07/04/12  . Degenerative joint disease    right knee  . DVT (deep vein thrombosis) in pregnancy 1979   during pregnancy. lower extremity  . Family history of colon cancer   . Family history of uterine cancer   . History of chemotherapy     cycle 5 Folfox completed 09/23/11  . History of radiation therapy 10/25/11 -12/01/11   pelvis/presacral region  . Morbid obesity ([place])   . OSA (obstructive sleep apnea) 07/04/2018   Severe per split night study 11/19  Trial of CPAP therapy on 12 cm H2O with a Wide size Resmed  Nasal Mask AirFit N10 mask and heated humidification.  . Osteopenia   . 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 Stress incontinence, female    not current    Family History  Problem Relation Age of Onset  . Uterine cancer Mother 48       deceased 27  . Diabetes Father   . Colon cancer Sister 4       currently 58  . Alcoholism Brother     Past Surgical History:  Procedure Laterality Date  . ABDOMINAL HYSTERECTOMY  3 years ago  . BREAST CYST EXCISION Right 1978  . CESAREAN SECTION    . COLON SURGERY     LAR, reoperation for necrotic ileostomy, transverse colostomy  . COLOSTOMY  05/27/11  . COLOSTOMY TAKEDOWN  07/11/2012   Procedure: COLOSTOMY TAKEDOWN;  Surgeon: Rolm Bookbinder, MD;  [facility]: [facility];  Service: General;  Laterality: N/A;  colostomy takedown  . JOINT REPLACEMENT  2002   hip replacement  . [place]  . MASS EXCISION  2011   "mass removed from ovaries"  . PORT-A-CATH REMOVAL  07/11/2012   Procedure: REMOVAL PORT-A-CATH;  Surgeon: Rolm Bookbinder, MD;  [facility]: [facility];  Service: General;  Laterality: N/A;  removal portacath  . PORTACATH PLACEMENT  07/26/11   right portacath  . TOTAL ABDOMINAL HYSTERECTOMY W/ BILATERAL SALPINGOOPHORECTOMY  11/30/09   BSO  . TOTAL HIP ARTHROPLASTY  2002   left hip   Social History   Occupational History  . 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 Smoking status: Never Smoker  . Smokeless tobacco: Never Used  Substance and Sexual Activity  . Alcohol use: Yes    Alcohol/week: 0.0 standard drinks    Comment: occ  . Drug use: No  . Sexual activity: Not on file

## 2019-12-13 NOTE — Addendum Note (Signed)
Addended by: Kelle Darting A on: 12/13/2019 03:17 PM   Modules accepted: Orders

## 2019-12-14 NOTE — Patient Instructions (Signed)
Visit Information  Goals Addressed            This Visit's Progress   . Hypertension: Blood pressure goal less than 140/90       CARE PLAN ENTRY (see longitudinal plan of care for additional care plan information)  Current Barriers:  . Uncontrolled hypertension, complicated by depression and osteoarthritis  . Current antihypertensive regimen: losartan 50mg  daily (increased from 25mg  on 11/13/19), hctz 12.5mg  daily . Previous antihypertensives tried: None noted  . Last practice recorded BP readings:  BP Readings from Last 3 Encounters:  11/13/19 (!) 153/79  07/24/19 (!) 161/83  07/13/19 128/84 .   Marland Kitchen Current home BP readings: N/A . Most recent GFR: 63.88 mL/min (07/24/19)  . Pharmacist Clinical Goal(s):  Marland Kitchen Over the next 90 days, patient will work with PharmD and providers to optimize antihypertensive regimen . Over the next 90 days, pt's BP will be less than 140/90  Interventions: . Inter-disciplinary care team collaboration (see longitudinal plan of care) . Comprehensive medication review performed; medication list updated in the electronic medical record.  Marland Kitchen Purchase new BP cuff to record readings  Patient Self Care Activities:  . Over next 90 days patient will continue to check BP 3-4 times per week, document, and provide at future appointments . Patient will focus on medication adherence by filling HTN meds appropriately  Initial goal documentation     . Pharmacy Care Plan       CARE PLAN ENTRY  Current Barriers:  . Chronic Disease Management support, education, and care coordination needs related to Pre-DM, HTN, Depression, Osteoarthritis, Osteopenia, OSA, Insomnia  Pharmacist Clinical Goal(s):  Marland Kitchen Over the next 90 days, patient will demonstrate Improved medication adherence as evidenced by appropriate fill dates with the assistance of adherence packaging . BP goal < 140/90 . A1c goal <6.5% . LDL goal <100 . Stable bone density  . Complete a1c at next office  visit . Complete lipid panel at next office visit  Interventions: . Comprehensive medication review performed. Marland Kitchen Purchase BP cuff and check BP 3-4 times per week . Increase meloxicam to 15mg  when needed to see if this helps further with pain (completed) . Take calcium/vitamin D daily to help with bone strength . Discontinue escitalopram and start fluoxetine (completed)  Patient Self Care Activities:  . Patient verbalizes understanding of plan to follow as described above, Self administers medications as prescribed, Calls pharmacy for medication refills, and Calls provider office for new concerns or questions  Initial goal documentation     . COMPLETED: Stop taking escitalopram and start taking fluoxetine 20mg  daily         Patient verbalizes understanding of instructions provided today.   Telephone follow up appointment with pharmacy team member scheduled for: 12/26/19  De [place]anch, PharmD Clinical Pharmacist [facility] [phone]

## 2019-12-15 ENCOUNTER — Ambulatory Visit (HOSPITAL_BASED_OUTPATIENT_CLINIC_OR_DEPARTMENT_OTHER)
Admission: RE | Admit: 2019-12-15 | Discharge: 2019-12-15 | Disposition: A | Discharge status: Home / Self Care | Payer: Medicare HMO | Source: Ambulatory Visit | Attending: Family Medicine | Admitting: Family Medicine

## 2019-12-15 ENCOUNTER — Other Ambulatory Visit: Payer: Self-pay

## 2019-12-15 DIAGNOSIS — M5441 Lumbago with sciatica, right side: Secondary | ICD-10-CM | POA: Insufficient documentation

## 2019-12-15 DIAGNOSIS — M545 Low back pain: Secondary | ICD-10-CM | POA: Diagnosis not present

## 2019-12-17 ENCOUNTER — Other Ambulatory Visit: Payer: Self-pay

## 2019-12-17 ENCOUNTER — Other Ambulatory Visit (INDEPENDENT_AMBULATORY_CARE_PROVIDER_SITE_OTHER): Payer: Medicare HMO

## 2019-12-17 ENCOUNTER — Telehealth: Payer: Self-pay | Admitting: Family Medicine

## 2019-12-17 DIAGNOSIS — I10 Essential (primary) hypertension: Secondary | ICD-10-CM | POA: Diagnosis not present

## 2019-12-17 NOTE — Telephone Encounter (Signed)
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  There is severe right-sided narrowing at L4-5.  Both sides are severely narrowed at L5-S1.    Could consider referral for epidural steroid injections.  Chiropractic would be another option.  Surgical consult if pain does not improve.

## 2019-12-18 LAB — BASIC METABOLIC PANEL
BUN: 21 mg/dL (ref 6–23)
CO2: 28 mEq/L (ref 19–32)
Calcium: 9.3 mg/dL (ref 8.4–10.5)
Chloride: 104 mEq/L (ref 96–112)
Creatinine, Ser: 1.23 mg/dL — ABNORMAL HIGH (ref 0.40–1.20)
GFR: 52.57 mL/min — ABNORMAL LOW (ref >60.00)
Glucose, Bld: 84 mg/dL (ref 70–99)
Potassium: 3.8 mEq/L (ref 3.5–5.1)
Sodium: 140 mEq/L (ref 135–145)

## 2019-12-26 ENCOUNTER — Other Ambulatory Visit: Payer: Self-pay

## 2019-12-26 ENCOUNTER — Ambulatory Visit: Payer: Medicare HMO | Admitting: Pharmacist

## 2019-12-26 DIAGNOSIS — M5416 Radiculopathy, lumbar region: Secondary | ICD-10-CM

## 2019-12-26 DIAGNOSIS — I10 Essential (primary) hypertension: Secondary | ICD-10-CM

## 2019-12-26 NOTE — Chronic Care Management (AMB) (Signed)
Chronic Care Management Pharmacy  Name: Jasmine Clayton  MRN: [id number] DOB: 11-Oct-1950   Chief Complaint/ HPI  Jasmine Clayton,  69 y.o. , female presents for their Follow-Up CCM visit with the clinical pharmacist via telephone due to COVID-19 Pandemic.  PCP : Debbrah Alar, NP  Their chronic conditions include: Pre-DM, HTN, Depression, Osteoarthritis, Osteopenia, OSA, Insomnia  Office Visits: 11/28/19: Visit w/ Debbrah Alar, NP - Encouraged pt to drop down to meloxicam 7.5mg  and use tylenol as needed for pain. Referring pt to orthopedics. Bmet showed decline in kidney function (recommended patient to D/C meloxicam)  Consult Visit: 12/05/19: Ortho visit w/ Dr. Junius Roads. MRI to determine possible epidural injection vs chiropractic. Prescribed baclofen and gabapentin. 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 Both sides severely narrowed at L5-S1. Consider referral for epidural steroid injections or chiropractic. Surgical consult if pain not improved  Medications: Outpatient Encounter Medications as of 12/26/2019  Medication Sig  . aspirin 81 MG tablet Take 81 mg by mouth daily.    . B Complex Vitamins (B-COMPLEX/B-12 PO) Take 1 tablet by mouth daily.   . baclofen (LIORESAL) 10 MG tablet Take 0.5-1 tablets (5-10 mg total) by mouth 3 (three) times daily as needed for muscle spasms.  Marland Kitchen buPROPion (WELLBUTRIN XL) 300 MG 24 hr tablet Take 1 tab by mouth once daily  . Calcium Carb-Cholecalciferol (CALCIUM-VITAMIN D3) 600-500 MG-UNIT CAPS Take 1 tablet by mouth 2 (two) times daily.  Marland Kitchen FLUoxetine (PROZAC) 20 MG capsule TAKE ONE CAPSULE BY MOUTH WITH BREAKFAST EVERY Tatum Massman  . gabapentin (NEURONTIN) 100 MG capsule 1 PO q HS, may increase to 1 PO TID if needed  . hydrochlorothiazide (HYDRODIURIL) 12.5 MG tablet TAKE 1 TABLET(12.5 MG) BY MOUTH DAILY  . losartan (COZAAR) 50 MG tablet Take 1 tablet (50 mg total) by mouth  daily.  . Omega-3 Fatty Acids (FISH OIL) 1200 MG CAPS Take 1 capsule by mouth daily.   Facility-Administered Encounter Medications as of 12/26/2019  Medication  . sodium chloride 0.9 % injection 10 mL   SDOH Screenings   Alcohol Screen:   . Last Alcohol Screening Score (AUDIT):   Depression (PHQ2-9): Medium Risk  . PHQ-2 Score: 7  Financial Resource Strain: Low Risk   . Difficulty of Paying Living Expenses: Not very hard  Food Insecurity:   . Worried About Charity fundraiser in the Last Year:   . [place] in the Last Year:   Housing:   . Last Housing Risk Score:   Physical Activity:   . Days of Exercise per Week:   . Minutes of Exercise per Session:   Social Connections:   . Frequency of Communication with Friends and Family:   . Frequency of Social Gatherings with Friends and Family:   . Attends Religious Services:   . Active Member of Clubs or Organizations:   . Attends Archivist Meetings:   Marland Kitchen Marital Status:   Stress:   . Feeling of Stress :   Tobacco Use: Low Risk   . Smoking Tobacco Use: Never Smoker  . Smokeless Tobacco Use: Never Used  Transportation Needs:   . Film/video editor (Medical):   Marland Kitchen Lack of Transportation (Non-Medical):      Current Diagnosis/Assessment:  Goals Addressed            This Visit's Progress   . Check blood pressure 3 to 4 times per week and record  readings   Not on track   . Complete a1c lab at next office visit   Not on track   . Complete lipid panel at next office visit   Not on track   . COMPLETED: Increase meloxicam to 15mg  daily as needed for pain       D/C'd and patient referred to Ortho. Now taking gabapentin and baclofen    . Pharmacy Care Plan   On track    CARE PLAN ENTRY  Current Barriers:  . Chronic Disease Management support, education, and care coordination needs related to Pre-DM, HTN, Depression, Osteoarthritis, Osteopenia, OSA, Insomnia   Hypertension . Pharmacist Clinical  Goal(s): o Over the next 90 days, patient will work with PharmD and providers to achieve BP goal <140/90 . Current regimen:  o  Losartan 50mg  daily, hctz 12.5mg  daily . Interventions: o Requested patient to purchase blood pressure cuff and check BP 3-4 times per week . Patient self care activities - Over the next 90 days, patient will: o Check BP 3-4 times per week, document, and provide at future appointments o Ensure daily salt intake < 2300 mg/Kaesha Kirsch  Hyperlipidemia Risk . Pharmacist Clinical Goal(s): o Over the next 180 days, patient will work with PharmD and providers to achieve LDL goal <100 . Current regimen:  o Fish oil 1200mg  daily . Interventions: o Consider completing lipid panel at next visit . Patient self care activities - Over the next 180 days, patient will: o Complete lipid panel for further assessment of hyperlipidemia risk   Pre-Diabetes . Pharmacist Clinical Goal(s): o Over the next 180 days, patient will work with PharmD and providers to maintain A1c goal <6.5% . Current regimen:  o Diet and exercise management   . Interventions: o Consider completing lipid panel at next visit . Patient self care activities - Over the next 180 days, patient will: o Complete a1c for further assessment of pre-diabetes/diabetes risk  Osteoarthritis  . Pharmacist Clinical Goal(s) o Over the next 90 days, patient will work with PharmD and providers to reduce symptoms of osteoarthritis  . Current regimen:  o Baclofen 10mg  three times daily as needed, gabapentin 100mg  three times daily as needed  . Interventions: o Encouraged patient to continue follow up with Dr. Junius Roads . Patient self care activities - Over the next 90 days, patient will: o Maintain osteoarthritis medication regimen  Medication management . Pharmacist Clinical Goal(s): o Over the next 90 days, patient will work with PharmD and providers to maintain optimal medication adherence . Current pharmacy:  [facility] . Interventions o Comprehensive medication review performed. o Utilize [facility] pharmacy for medication synchronization, packaging and delivery . Patient self care activities - Over the next 90 days, patient will: o Focus on medication adherence by filling medications appropriately  o Take medications as prescribed o Report any questions or concerns to PharmD and/or provider(s)  Please see past updates related to this goal by clicking on the "Past Updates" button in the selected goal         Hypertension   CMP Latest Ref Rng & Units 12/17/2019 11/28/2019 07/24/2019  Glucose 70 - 99 mg/dL 84 101(H) 87  BUN 6 - 23 mg/dL 21 18 15   Creatinine 0.40 - 1.20 mg/dL 1.23(H) 1.32(H) 1.04  Sodium 135 - 145 mEq/L 140 140 139  Potassium 3.5 - 5.1 mEq/L 3.8 3.6 3.6  Chloride 96 - 112 mEq/L 104 103 101  CO2 19 - 32 mEq/L 28 30 30   Calcium 8.4 - 10.5 mg/dL  9.3 9.2 9.4  Total Protein 6.0 - 8.3 g/dL - - -  Total Bilirubin 0.2 - 1.2 mg/dL - - -  Alkaline Phos 39 - 117 U/L - - -  AST 0 - 37 U/L - - -  ALT 0 - 35 U/L - - -   Kidney Function Lab Results  Component Value Date/Time   CREATININE 1.23 (H) 12/17/2019 02:47 PM   CREATININE 1.32 (H) 11/28/2019 10:11 AM   CREATININE 1.1 04/03/2014 08:19 AM   CREATININE 1.04 01/07/2014 02:14 AM   CREATININE 1.19 (H) 10/01/2013 10:59 AM   CREATININE 1.1 03/12/2013 09:45 AM   GFR 52.57 (L) 12/17/2019 02:47 PM   GFRNONAA 68 (L) 07/16/2012 09:00 AM   GFRNONAA 58 (L) 03/15/2011 08:28 AM   GFRAA 79 (L) 07/16/2012 09:00 AM   GFRAA >60 03/15/2011 08:28 AM   BP today is: Unable to assess due to phone visit  Office blood pressures are  BP Readings from Last 3 Encounters:  11/13/19 (!) 153/79  07/24/19 (!) 161/83  07/13/19 128/84   Blood pressure goal <140/90  Patient has failed these meds in the past: None noted  Patient is currently uncontrolled on the following medications:  losartan 50mg  daily (increased from 25mg  on 11/13/19), hctz 12.5mg   daily  Patient checks BP at home infrequently   From 11/27/19 CCM Visit BP cuff is too small. Was going to take it back, but threw the whole cuff away on accident.  She states she will start using wrist cuff from her sister. We discussed that the wrist cuff may not be accurate   Update 12/26/19 States she did purchase a new cuff, but is still working on getting it to work properly.   Plan -Check blood pressure 3-4 times per week and record -Continue current meidcations  Osteoarthritis   Patient has failed these meds in past: None noted   Patient is currently uncontrolled on the following medications: baclofen 10mg  three times daily as needed, gabapentin 100mg  three times daily as needed   Meloxicam was stopped due to decline in kidney function. Noted pt also recently increased losartan which could contribute to increase in creatinine. Pt referred to Dr. Junius Roads (ortho)  Baclofen  Uses twice daily States she needs a refill on this   Gabapentin Uses 3 times daily States she will need a refill soon on this  Feels like her pain is improving. Feels like certain activities or shoes may cause more pain.  Does not have a follow up with Dr. Junius Roads.  We discussed:  Encouraged follow up with Dr. Junius Roads to help manage pain and discuss treatment options  Plan -Continue current medications -Follow up with Dr. Junius Roads  Depression   Patient has failed these meds in past: None noted Patient is currently controlled on the following medications: fluoxetine 20mg  daily, bupropion 300mg  XL daily  Patient states fluoxetine is working well.   Plan  -Continue current medications     [facility] Reviewed patient's [facility] medication and Epic medication profile assuring there are no discrepancies or gaps in therapy. Confirmed all fill dates appropriate and verified with patient that there is a sufficient quantity of all prescribed medications at home. Informed patient to call me any time if  needing medications before scheduled deliveries. The next anticipated medication sync date is 02/04/20.  Needs acute fill of the following:  Baclofen now (acute form completed) Gabapentin around 01/04/20 (acute form completed)

## 2019-12-26 NOTE — Patient Instructions (Signed)
Visit Information  Goals Addressed            This Visit's Progress   . Check blood pressure 3 to 4 times per week and record readings   Not on track   . Complete a1c lab at next office visit   Not on track   . Complete lipid panel at next office visit   Not on track   . COMPLETED: Increase meloxicam to 15mg  daily as needed for pain       D/C'd and patient referred to Ortho. Now taking gabapentin and baclofen    . Pharmacy Care Plan   On track    CARE PLAN ENTRY  Current Barriers:  . Chronic Disease Management support, education, and care coordination needs related to Pre-DM, HTN, Depression, Osteoarthritis, Osteopenia, OSA, Insomnia   Hypertension . Pharmacist Clinical Goal(s): o Over the next 90 days, patient will work with PharmD and providers to achieve BP goal <140/90 . Current regimen:  o  Losartan 50mg  daily, hctz 12.5mg  daily . Interventions: o Requested patient to purchase blood pressure cuff and check BP 3-4 times per week . Patient self care activities - Over the next 90 days, patient will: o Check BP 3-4 times per week, document, and provide at future appointments o Ensure daily salt intake < 2300 mg/day  Hyperlipidemia Risk . Pharmacist Clinical Goal(s): o Over the next 180 days, patient will work with PharmD and providers to achieve LDL goal <100 . Current regimen:  o Fish oil 1200mg  daily . Interventions: o Consider completing lipid panel at next visit . Patient self care activities - Over the next 180 days, patient will: o Complete lipid panel for further assessment of hyperlipidemia risk   Pre-Diabetes . Pharmacist Clinical Goal(s): o Over the next 180 days, patient will work with PharmD and providers to maintain A1c goal <6.5% . Current regimen:  o Diet and exercise management   . Interventions: o Consider completing lipid panel at next visit . Patient self care activities - Over the next 180 days, patient will: o Complete a1c for further assessment  of pre-diabetes/diabetes risk  Osteoarthritis  . Pharmacist Clinical Goal(s) o Over the next 90 days, patient will work with PharmD and providers to reduce symptoms of osteoarthritis  . Current regimen:  o Baclofen 10mg  three times daily as needed, gabapentin 100mg  three times daily as needed  . Interventions: o Encouraged patient to continue follow up with Dr. Junius Roads . Patient self care activities - Over the next 90 days, patient will: o Maintain osteoarthritis medication regimen  Medication management . Pharmacist Clinical Goal(s): o Over the next 90 days, patient will work with PharmD and providers to maintain optimal medication adherence . Current pharmacy: [facility] . Interventions o Comprehensive medication review performed. o Utilize [facility] pharmacy for medication synchronization, packaging and delivery . Patient self care activities - Over the next 90 days, patient will: o Focus on medication adherence by filling medications appropriately  o Take medications as prescribed o Report any questions or concerns to PharmD and/or provider(s)  Please see past updates related to this goal by clicking on the "Past Updates" button in the selected goal         The patient verbalized understanding of instructions provided today and agreed to receive a mailed copy of patient instruction and/or educational materials.  Telephone follow up appointment with pharmacy team member scheduled for: 02/01/2020  Melvenia Beam Day, PharmD Clinical Pharmacist [facility] [phone]

## 2020-01-25 ENCOUNTER — Other Ambulatory Visit: Payer: Self-pay | Admitting: Family

## 2020-01-25 MED ORDER — LOSARTAN POTASSIUM 50 MG PO TABS: 50.0000 mg | ORAL_TABLET | Freq: Every day | ORAL | 1 refills | Status: DC

## 2020-01-30 ENCOUNTER — Ambulatory Visit: Payer: Self-pay | Admitting: Pharmacist

## 2020-01-30 NOTE — Chronic Care Management (AMB) (Signed)
Reviewed chart for medication changes ahead of medication coordination call.  No OVs, Consults, or hospital visits since last care coordination call/Pharmacist visit.   No medication changes indicated OR if recent visit, treatment plan here.  BP Readings from Last 3 Encounters:  11/28/19 (!) 144/81  11/13/19 (!) 153/79  07/24/19 (!) 161/83    Lab Results  Component Value Date   HGBA1C 5.3 02/16/2017     Patient obtains medications through Adherence Packaging  90 Days   Last adherence delivery included:  Meloxicam 7.5mg  one daily as needed- Vial   Bupropion Hcl XL 300mg  one daily- Breakfast   Losartan Potassium 25mg  one daily-Breakfast   Hydrochlorothiazide 12.5mg  daily-Breakfast  Fluoxetine 20mg  one daily-Breakfast   Patient is due for next adherence delivery on: 02/05/2020. Called patient and reviewed medications and coordinated delivery.  This delivery to include: Gabapentin 100mg  -Vial  Baclofen 10mg  -Vial  Hydrochlorothiazide 12.5mg  daily-Breakfast  Fluoxetine 20mg  daily-Breakfast  Bupropn Hcl 300mg  Xl daily-Breakfast  Losartan Potassium 50mg  daily-Breakfast   Confirmed delivery date of 02/05/2020, advised patient that pharmacy will contact them the morning of delivery.

## 2020-02-01 ENCOUNTER — Ambulatory Visit: Payer: Medicare HMO | Admitting: Pharmacist

## 2020-02-01 ENCOUNTER — Other Ambulatory Visit: Payer: Self-pay

## 2020-02-01 DIAGNOSIS — I10 Essential (primary) hypertension: Secondary | ICD-10-CM

## 2020-02-01 DIAGNOSIS — F32A Depression, unspecified: Secondary | ICD-10-CM

## 2020-02-01 NOTE — Chronic Care Management (AMB) (Signed)
Chronic Care Management Pharmacy  Name: Jasmine Clayton  MRN: [id number] DOB: [birth year]   Chief Complaint/ HPI  Jasmine Clayton,  69 y.o. , female presents for their Follow-Up CCM visit with the clinical pharmacist via telephone due to COVID-19 Pandemic.  PCP : Jasmine Alar, NP  Their chronic conditions include: Pre-DM, HTN, Depression, Osteoarthritis, Osteopenia, OSA, Insomnia  Office Visits: None since last CCM visit on 12/26/19.   Consult Visit: None since last CCM visit on 12/26/19.   Medications: Outpatient Encounter Medications as of 02/01/2020  Medication Sig  . aspirin 81 MG tablet Take 81 mg by mouth daily.    . B Complex Vitamins (B-COMPLEX/B-12 PO) Take 1 tablet by mouth daily.   . baclofen (LIORESAL) 10 MG tablet Take 0.5-1 tablets (5-10 mg total) by mouth 3 (three) times daily as needed for muscle spasms.  Marland Kitchen buPROPion (WELLBUTRIN XL) 300 MG 24 hr tablet Take 1 tab by mouth once daily  . Calcium Carb-Cholecalciferol (CALCIUM-VITAMIN D3) 600-500 MG-UNIT CAPS Take 1 tablet by mouth 2 (two) times daily.  Marland Kitchen FLUoxetine (PROZAC) 20 MG capsule TAKE ONE CAPSULE BY MOUTH WITH BREAKFAST EVERY Jasmine Clayton  . gabapentin (NEURONTIN) 100 MG capsule 1 PO q HS, may increase to 1 PO TID if needed  . hydrochlorothiazide (HYDRODIURIL) 12.5 MG tablet TAKE ONE TABLET BY MOUTH EVERY MORNING  . losartan (COZAAR) 50 MG tablet Take 1 tablet (50 mg total) by mouth daily.  . Omega-3 Fatty Acids (FISH OIL) 1200 MG CAPS Take 1 capsule by mouth daily.   Facility-Administered Encounter Medications as of 02/01/2020  Medication  . sodium chloride 0.9 % injection 10 mL   SDOH Screenings   Alcohol Screen:   . Last Alcohol Screening Score (AUDIT):   Depression (PHQ2-9): Medium Risk  . PHQ-2 Score: 7  Financial Resource Strain: Low Risk   . Difficulty of Paying Living Expenses: Not very hard  Food Insecurity:   . Worried About Charity fundraiser in the Last Year:   . [place] in the  Last Year:   Housing:   . Last Housing Risk Score:   Physical Activity:   . Days of Exercise per Week:   . Minutes of Exercise per Session:   Social Connections:   . Frequency of Communication with Friends and Family:   . Frequency of Social Gatherings with Friends and Family:   . Attends Religious Services:   . Active Member of Clubs or Organizations:   . Attends Archivist Meetings:   Marland Kitchen Marital Status:   Stress:   . Feeling of Stress :   Tobacco Use: Low Risk   . Smoking Tobacco Use: Never Smoker  . Smokeless Tobacco Use: Never Used  Transportation Needs:   . Film/video editor (Medical):   Marland Kitchen Lack of Transportation (Non-Medical):      Current Diagnosis/Assessment:  Goals Addressed            This Visit's Progress   . Chronic Care Management Pharmacy Care Plan       CARE PLAN ENTRY (see longitudinal plan of care for additional care plan information)  Current Barriers:  . Chronic Disease Management support, education, and care coordination needs related to Pre-DM, HTN, Depression, Osteoarthritis, Osteopenia, OSA, Insomnia   Hypertension BP Readings from Last 3 Encounters:  11/28/19 (!) 144/81  11/13/19 (!) 153/79  07/24/19 (!) 161/83   . Pharmacist Clinical Goal(s): o Over the next 90 days, patient will work with PharmD and  providers to maintain BP goal <140/90 . Current regimen:   Losartan 50mg  daily  Hctz 12.5mg  daily . Interventions: o Requested patient to purchase blood pressure cuff and check BP 3-4 times per week . Patient self care activities - Over the next 90 days, patient will: o Check BP 3-4 times per week, document, and provide at future appointments o Ensure daily salt intake < 2300 mg/[name]  Hyperlipidemia Lab Results  Component Value Date/Time   LDLCALC 109 (H) 08/25/2016 08:05 AM   . Pharmacist Clinical Goal(s): o Over the next 90 days, patient will work with PharmD and providers to achieve LDL goal < 100 . Current regimen:    o Fish oil 1200mg  daily . Interventions: o Consider completing lipid panel at next visit . Patient self care activities - Over the next 180 days, patient will: o Complete lipid panel for further assessment of hyperlipidemia risk   Pre-Diabetes Lab Results  Component Value Date/Time   HGBA1C 5.3 02/16/2017 09:14 AM   HGBA1C 5.7 04/23/2016 08:56 AM   . Pharmacist Clinical Goal(s): o Over the next 90 days, patient will work with PharmD and providers to maintain A1c goal <6.5% . Current regimen:  o Diet and exercise management   . Interventions: o Consider completing a1c at next visit . Patient self care activities - Over the next 180 days, patient will: o Complete a1c for further assessment of pre-diabetes/diabetes risk  Depression . Pharmacist Clinical Goal(s) o Over the next 90 days, patient will work with PharmD and providers to reduce symptoms of depression  . Current regimen:   Fluoxetine 20mg  daily  Bupropion 300mg  XL daily . Interventions: o Collaboration with provider regarding medication management (escitalopram switch to fluoxetine) . Patient self care activities - Over the next 90 days, patient will: o Maintain depression medication regimen.   Medication management . Pharmacist Clinical Goal(s): o Over the next 90 days, patient will work with PharmD and providers to maintain optimal medication adherence . Current pharmacy: [facility] . Interventions o Comprehensive medication review performed. o Utilize [facility] pharmacy for medication synchronization, packaging and delivery . Patient self care activities - Over the next 90 days, patient will: o Focus on medication adherence by filling and taking medications appropriately o Take medications as prescribed o Report any questions or concerns to PharmD and/or provider(s)  Please see past updates related to this goal by clicking on the "Past Updates" button in the selected goal         Hypertension   CMP Latest  Ref Rng & Units 12/17/2019 11/28/2019 07/24/2019  Glucose 70 - 99 mg/dL 84 101(H) 87  BUN 6 - 23 mg/dL 21 18 15   Creatinine 0.40 - 1.20 mg/dL 1.23(H) 1.32(H) 1.04  Sodium 135 - 145 mEq/L 140 140 139  Potassium 3.5 - 5.1 mEq/L 3.8 3.6 3.6  Chloride 96 - 112 mEq/L 104 103 101  CO2 19 - 32 mEq/L 28 30 30   Calcium 8.4 - 10.5 mg/dL 9.3 9.2 9.4  Total Protein 6.0 - 8.3 g/dL - - -  Total Bilirubin 0.2 - 1.2 mg/dL - - -  Alkaline Phos 39 - 117 U/L - - -  AST 0 - 37 U/L - - -  ALT 0 - 35 U/L - - -   Kidney Function Lab Results  Component Value Date/Time   CREATININE 1.23 (H) 12/17/2019 02:47 PM   CREATININE 1.32 (H) 11/28/2019 10:11 AM   CREATININE 1.1 04/03/2014 08:19 AM   CREATININE 1.04 01/07/2014 02:14 AM  CREATININE 1.19 (H) 10/01/2013 10:59 AM   CREATININE 1.1 03/12/2013 09:45 AM   GFR 52.57 (L) 12/17/2019 02:47 PM   GFRNONAA 68 (L) 07/16/2012 09:00 AM   GFRNONAA 58 (L) 03/15/2011 08:28 AM   GFRAA 79 (L) 07/16/2012 09:00 AM   GFRAA >60 03/15/2011 08:28 AM   BP today is: Unable to assess due to phone visit  Hypertension   Blood Pressure Goal <140/90  Office blood pressures are  BP Readings from Last 3 Encounters:  11/28/19 (!) 144/81  11/13/19 (!) 153/79  07/24/19 (!) 161/83   Blood pressure goal <140/90  Patient has failed these meds in the past: None noted  Patient is currently controlled on the following medications:    Losartan 50mg  daily  Hctz 12.5mg  daily  Patient checks BP at home 1-2x per week   From 11/27/19 CCM Visit BP cuff is too small. Was going to take it back, but threw the whole cuff away on accident.  She states she will start using wrist cuff from her sister. We discussed that the wrist cuff may not be accurate   Update 12/26/19 States she did purchase a new cuff, but is still working on getting it to work properly.   Update 02/01/20 137 79 143 78 120 72 129 73 144  78 Average 134.6 76  Plan -Check blood pressure 3-4 times per week and  record -Continue current meidcations  Osteoarthritis   Patient has failed these meds in past: None noted   Patient is currently uncontrolled on the following medications:   Baclofen 10mg  three times daily as needed  Gabapentin 100mg  three times daily as needed   Meloxicam was stopped due to decline in kidney function. Noted pt also recently increased losartan which could contribute to increase in creatinine. Pt referred to Dr. Junius Roads (ortho)  Baclofen  Uses twice daily States she needs a refill on this   Gabapentin Uses 3 times daily States she will need a refill soon on this  Feels like her pain is improving. Feels like certain activities or shoes may cause more pain.  Does not have a follow up with Dr. Junius Roads.  We discussed:  Encouraged follow up with Dr. Junius Roads to help manage pain and discuss treatment options  Update 02/01/20 Feeling very good  Plan -Continue current medications -Follow up with Dr. Junius Roads  Depression   Patient has failed these meds in past: None noted Patient is currently controlled on the following medications:   Fluoxetine 20mg  daily  Bupropion 300mg  XL daily  Patient states fluoxetine is working well.   Update 02/01/20 PHQ9 = 3 (down from 7 on 11/13/19)  Plan  -Continue current medications    [facility] Reviewed patient's [facility] medication and Epic medication profile assuring there are no discrepancies or gaps in therapy. Confirmed all fill dates appropriate and verified with patient that there is a sufficient quantity of all prescribed medications at home. Informed patient to call me any time if needing medications before scheduled deliveries. The next anticipated medication sync date is 05/01/20.

## 2020-02-12 NOTE — Patient Instructions (Signed)
Visit Information  Goals Addressed            This Visit's Progress   . Chronic Care Management Pharmacy Care Plan       CARE PLAN ENTRY (see longitudinal plan of care for additional care plan information)  Current Barriers:  . Chronic Disease Management support, education, and care coordination needs related to Pre-DM, HTN, Depression, Osteoarthritis, Osteopenia, OSA, Insomnia   Hypertension BP Readings from Last 3 Encounters:  11/28/19 (!) 144/81  11/13/19 (!) 153/79  07/24/19 (!) 161/83   . Pharmacist Clinical Goal(s): o Over the next 90 days, patient will work with PharmD and providers to maintain BP goal <140/90 . Current regimen:   Losartan 50mg  daily  Hctz 12.5mg  daily . Interventions: o Requested patient to purchase blood pressure cuff and check BP 3-4 times per week . Patient self care activities - Over the next 90 days, patient will: o Check BP 3-4 times per week, document, and provide at future appointments o Ensure daily salt intake < 2300 mg/Jasmine Clayton  Hyperlipidemia Lab Results  Component Value Date/Time   LDLCALC 109 (H) 08/25/2016 08:05 AM   . Pharmacist Clinical Goal(s): o Over the next 90 days, patient will work with PharmD and providers to achieve LDL goal < 100 . Current regimen:  o Fish oil 1200mg  daily . Interventions: o Consider completing lipid panel at next visit . Patient self care activities - Over the next 180 days, patient will: o Complete lipid panel for further assessment of hyperlipidemia risk   Pre-Diabetes Lab Results  Component Value Date/Time   HGBA1C 5.3 02/16/2017 09:14 AM   HGBA1C 5.7 04/23/2016 08:56 AM   . Pharmacist Clinical Goal(s): o Over the next 90 days, patient will work with PharmD and providers to maintain A1c goal <6.5% . Current regimen:  o Diet and exercise management   . Interventions: o Consider completing a1c at next visit . Patient self care activities - Over the next 180 days, patient will: o Complete a1c  for further assessment of pre-diabetes/diabetes risk  Depression . Pharmacist Clinical Goal(s) o Over the next 90 days, patient will work with PharmD and providers to reduce symptoms of depression  . Current regimen:   Fluoxetine 20mg  daily  Bupropion 300mg  XL daily . Interventions: o Collaboration with provider regarding medication management (escitalopram switch to fluoxetine) . Patient self care activities - Over the next 90 days, patient will: o Maintain depression medication regimen.   Medication management . Pharmacist Clinical Goal(s): o Over the next 90 days, patient will work with PharmD and providers to maintain optimal medication adherence . Current pharmacy: [facility] . Interventions o Comprehensive medication review performed. o Utilize [facility] pharmacy for medication synchronization, packaging and delivery . Patient self care activities - Over the next 90 days, patient will: o Focus on medication adherence by filling and taking medications appropriately o Take medications as prescribed o Report any questions or concerns to PharmD and/or provider(s)  Please see past updates related to this goal by clicking on the "Past Updates" button in the selected goal         The patient verbalized understanding of instructions provided today and agreed to receive a mailed copy of patient instruction and/or educational materials.  Telephone follow up appointment with pharmacy team member scheduled for: 05/05/2020  Melvenia Beam Ananias Kolander, PharmD Clinical Pharmacist [facility] [phone]

## 2020-02-27 ENCOUNTER — Telehealth: Payer: Self-pay | Admitting: Pharmacist

## 2020-02-27 ENCOUNTER — Other Ambulatory Visit: Payer: Self-pay

## 2020-02-27 ENCOUNTER — Ambulatory Visit (INDEPENDENT_AMBULATORY_CARE_PROVIDER_SITE_OTHER): Payer: Medicare HMO | Admitting: Family

## 2020-02-27 ENCOUNTER — Encounter: Payer: Self-pay | Admitting: Family

## 2020-02-27 VITALS — BP 151/66 | HR 79 | Temp 98.4°F | Resp 16 | Ht 60.0 in | Wt 229.0 lb

## 2020-02-27 DIAGNOSIS — Z1159 Encounter for screening for other viral diseases: Secondary | ICD-10-CM | POA: Diagnosis not present

## 2020-02-27 DIAGNOSIS — F32A Depression, unspecified: Secondary | ICD-10-CM

## 2020-02-27 DIAGNOSIS — I10 Essential (primary) hypertension: Secondary | ICD-10-CM

## 2020-02-27 DIAGNOSIS — F329 Major depressive disorder, single episode, unspecified: Secondary | ICD-10-CM

## 2020-02-27 LAB — COMPREHENSIVE METABOLIC PANEL
ALT: 19 U/L (ref 0–35)
AST: 20 U/L (ref 0–37)
Albumin: 4.2 g/dL (ref 3.5–5.2)
Alkaline Phosphatase: 115 U/L (ref 39–117)
BUN: 15 mg/dL (ref 6–23)
CO2: 29 mEq/L (ref 19–32)
Calcium: 10.1 mg/dL (ref 8.4–10.5)
Chloride: 103 mEq/L (ref 96–112)
Creatinine, Ser: 1.39 mg/dL — ABNORMAL HIGH (ref 0.40–1.20)
GFR: 45.62 mL/min — ABNORMAL LOW (ref >60.00)
Glucose, Bld: 97 mg/dL (ref 70–99)
Potassium: 3.4 mEq/L — ABNORMAL LOW (ref 3.5–5.1)
Sodium: 139 mEq/L (ref 135–145)
Total Bilirubin: 0.9 mg/dL (ref 0.2–1.2)
Total Protein: 7.3 g/dL (ref 6.0–8.3)

## 2020-02-27 MED ORDER — BUPROPION HCL ER (XL) 300 MG PO TB24: ORAL_TABLET | ORAL | 1 refills | Status: AC

## 2020-02-27 NOTE — Progress Notes (Signed)
Subjective:    Patient ID: Jasmine Clayton, female    DOB: [birth year], 69 y.o.   MRN: [id number]  HPI  Patient is a 69 yr old female who presents today for follow up.  HTN- maintained on hctz 12.5 and losartan.  BP Readings from Last 3 Encounters:  02/27/20 (!) 151/66  11/28/19 (!) 144/81  11/13/19 (!) 153/79   Depression- reports improved mobility because her knee is not hurting as much.  Trying to do more around the house.  Continues prozac once daily as well as wellbutrin.     Review of Systems See HPI  Past Medical History:  Diagnosis Date  . Adenocarcinoma of colon ([place]) 04/12/2011   "rectal-colon"  . Corneal ulcers and infections 07/04/12  . Degenerative joint disease    right knee  . DVT (deep vein thrombosis) in pregnancy 1979   during pregnancy. lower extremity  . Family history of colon cancer   . Family history of uterine cancer   . History of chemotherapy     cycle 5 Folfox completed 09/23/11  . History of radiation therapy 10/25/11 -12/01/11   pelvis/presacral region  . Morbid obesity ([place])   . OSA (obstructive sleep apnea) 07/04/2018   Severe per split night study 11/19  Trial of CPAP therapy on 12 cm H2O with a Wide size Resmed Nasal Mask AirFit N10 mask and heated humidification.  . Osteopenia   . 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 Stress incontinence, female    not current     Social History   Socioeconomic History  . Marital status: Widowed    Spouse name: Not on file  . Number of children: 1  . Years of education: Not on file  . Highest education level: Not on file  Occupational History  . 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 Smoking status: Never Smoker  . Smokeless tobacco: Never Used  Vaping Use  . Vaping Use: Never used  Substance and Sexual Activity  . Alcohol use: Yes    Alcohol/week: 0.0 standard drinks    Comment: occ  .  Drug use: No  . Sexual activity: Not on file  Other Topics Concern  . Not on file  Social History Narrative   Regular exercise:  No   Caffeine Use:  1 cup coffee daily   Husband passed 2/17   Daughter, son-in-law and her 2 children- they recently moved out   Completed the 12th grade, and some college.   Does not work.    Social Determinants of Health   Financial Resource Strain: Low Risk   . Difficulty of Paying Living Expenses: Not very hard  Food Insecurity:   . Worried About Charity fundraiser in the Last Year:   . Arboriculturist in the Last Year:   Transportation Needs:   . Film/video editor (Medical):   Marland Kitchen Lack of Transportation (Non-Medical):   Physical Activity:   . Days of Exercise per Week:   . Minutes of Exercise per Session:   Stress:   . Feeling of Stress :   Social Connections:   . Frequency of Communication with Friends and Family:   . Frequency of Social Gatherings with Friends and Family:   . Attends Religious Services:   . Active Member of Clubs or Organizations:   . Attends Archivist Meetings:   Marland Kitchen Marital  Status:   Intimate Partner Violence:   . Fear of Current or Ex-Partner:   . Emotionally Abused:   Marland Kitchen Physically Abused:   . Sexually Abused:     Past Surgical History:  Procedure Laterality Date  . ABDOMINAL HYSTERECTOMY  3 years ago  . BREAST CYST EXCISION Right 1978  . CESAREAN SECTION    . COLON SURGERY     LAR, reoperation for necrotic ileostomy, transverse colostomy  . COLOSTOMY  05/27/11  . COLOSTOMY TAKEDOWN  07/11/2012   Procedure: COLOSTOMY TAKEDOWN;  Surgeon: Rolm Bookbinder, MD;  Location: [facility];  Service: General;  Laterality: N/A;  colostomy takedown  . JOINT REPLACEMENT  2002   hip replacement  . [place]  . MASS EXCISION  2011   "mass removed from ovaries"  . PORT-A-CATH REMOVAL  07/11/2012   Procedure: REMOVAL PORT-A-CATH;  Surgeon: Rolm Bookbinder, MD;  Location: [facility];  Service: General;   Laterality: N/A;  removal portacath  . PORTACATH PLACEMENT  07/26/11   right portacath  . TOTAL ABDOMINAL HYSTERECTOMY W/ BILATERAL SALPINGOOPHORECTOMY  11/30/09   BSO  . TOTAL HIP ARTHROPLASTY  2002   left hip    Family History  Problem Relation Age of Onset  . Uterine cancer Mother 53       deceased 82  . Diabetes Father   . Colon cancer Sister 68       currently 62  . Alcoholism Brother     No Known Allergies  Current Outpatient Medications on File Prior to Visit  Medication Sig Dispense Refill  . aspirin 81 MG tablet Take 81 mg by mouth daily.      . B Complex Vitamins (B-COMPLEX/B-12 PO) Take 1 tablet by mouth daily.     . baclofen (LIORESAL) 10 MG tablet Take 0.5-1 tablets (5-10 mg total) by mouth 3 (three) times daily as needed for muscle spasms. 30 each 3  . buPROPion (WELLBUTRIN XL) 300 MG 24 hr tablet Take 1 tab by mouth once daily 90 tablet 1  . Calcium Carb-Cholecalciferol (CALCIUM-VITAMIN D3) 600-500 MG-UNIT CAPS Take 1 tablet by mouth 2 (two) times daily. 60 capsule 0  . FLUoxetine (PROZAC) 20 MG capsule TAKE ONE CAPSULE BY MOUTH WITH BREAKFAST EVERY DAY 90 capsule 0  . gabapentin (NEURONTIN) 100 MG capsule 1 PO q HS, may increase to 1 PO TID if needed 90 capsule 3  . hydrochlorothiazide (HYDRODIURIL) 12.5 MG tablet TAKE ONE TABLET BY MOUTH EVERY MORNING 90 tablet 1  . losartan (COZAAR) 50 MG tablet Take 1 tablet (50 mg total) by mouth daily. 90 tablet 1  . Omega-3 Fatty Acids (FISH OIL) 1200 MG CAPS Take 1 capsule by mouth daily.     Current Facility-Administered Medications on File Prior to Visit  Medication Dose Route Frequency Provider Last Rate Last Admin  . sodium chloride 0.9 % injection 10 mL  10 mL Intravenous PRN [name], MD   10 mL at 05/25/12 1153    BP (!) 151/66 (BP Location: Right Arm, Patient Position: Sitting, Cuff Size: Large)   Pulse 79   Temp 98.4 F (36.9 C) (Oral)   Resp 16   Ht 5' (1.524 m)   Wt (!) 229 lb (103.9 kg)   LMP  11/30/2009   SpO2 98%   BMI 44.72 kg/m       Objective:   Physical Exam Constitutional:      Appearance: She is well-developed.  Cardiovascular:     Rate and  Rhythm: Normal rate and regular rhythm.     Heart sounds: Normal heart sounds. No murmur heard.   Pulmonary:     Effort: Pulmonary effort is normal. No respiratory distress.     Breath sounds: Normal breath sounds. No wheezing.  Psychiatric:        Behavior: Behavior normal.        Thought Content: Thought content normal.        Judgment: Judgment normal.           Assessment & Plan:  HTN- bp acceptable. Continue current meds/doses. Obtain follow up cmet.  Depression- seems to be somewhat improved on current regimen. Continue same.  Depression screen [facility] 2/9 02/27/2020 11/13/2019 10/11/2019 05/15/2019 10/30/2018  Decreased Interest 1 1 1 1  0  Down, Depressed, Hopeless 1 1 2  0 1  PHQ - 2 Score 2 2 3 1 1   Altered sleeping 1 1 3 1 1   Tired, decreased energy 1 1 3 1 1   Change in appetite 2 1 1 1 1   Feeling bad or failure about yourself  1 1 1 1  0  Trouble concentrating 0 0 0 0 1  Moving slowly or fidgety/restless 1 1 3  0 0  Suicidal thoughts 0 0 0 0 0  PHQ-9 Score 8 7 14 5 5   Difficult doing work/chores Somewhat difficult Somewhat difficult Very difficult Somewhat difficult Somewhat difficult  Some recent data might be hidden     Morbid Obesity- we discussed referral to the medical weight management clinic. She declines at this time and wishes to continue weight loss efforts on her own.   This visit occurred during the SARS-CoV-2 public health emergency.  Safety protocols were in place, including screening questions prior to the visit, additional usage of staff PPE, and extensive cleaning of exam room while observing appropriate contact time as indicated for disinfecting solutions.

## 2020-02-27 NOTE — Progress Notes (Signed)
    Chronic Care Management Pharmacy Assistant   Name: Jasmine Clayton  MRN: [id number] DOB: 27-Jan-1951  Reason for Encounter: Medication Review  PCP : Debbrah Alar, NP  Allergies:  No Known Allergies  Medications: Outpatient Encounter Medications as of 02/27/2020  Medication Sig  . aspirin 81 MG tablet Take 81 mg by mouth daily.    . B Complex Vitamins (B-COMPLEX/B-12 PO) Take 1 tablet by mouth daily.   . baclofen (LIORESAL) 10 MG tablet Take 0.5-1 tablets (5-10 mg total) by mouth 3 (three) times daily as needed for muscle spasms.  Marland Kitchen buPROPion (WELLBUTRIN XL) 300 MG 24 hr tablet Take 1 tab by mouth once daily  . Calcium Carb-Cholecalciferol (CALCIUM-VITAMIN D3) 600-500 MG-UNIT CAPS Take 1 tablet by mouth 2 (two) times daily.  Marland Kitchen FLUoxetine (PROZAC) 20 MG capsule TAKE ONE CAPSULE BY MOUTH WITH BREAKFAST EVERY DAY  . gabapentin (NEURONTIN) 100 MG capsule 1 PO q HS, may increase to 1 PO TID if needed  . hydrochlorothiazide (HYDRODIURIL) 12.5 MG tablet TAKE ONE TABLET BY MOUTH EVERY MORNING  . losartan (COZAAR) 50 MG tablet Take 1 tablet (50 mg total) by mouth daily.  . Omega-3 Fatty Acids (FISH OIL) 1200 MG CAPS Take 1 capsule by mouth daily.   Facility-Administered Encounter Medications as of 02/27/2020  Medication  . sodium chloride 0.9 % injection 10 mL    Current Diagnosis: Patient Active Problem List   Diagnosis Date Noted  . OSA (obstructive sleep apnea) 07/04/2018  . 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 Osteopenia   . Depression 08/25/2016  . Combined forms of age-related cataract of both eyes 06/21/2016  . Hyperopia of both eyes with astigmatism and presbyopia 06/21/2016  . Meibomian gland dysfunction 06/21/2016  . Vitreous syneresis of both eyes 06/21/2016  . Insomnia 11/28/2015  . Family history of uterine cancer   . Vaginal dryness, menopausal 12/12/2014  . Hyperglycemia 01/07/2014  . Osteoarthritis, knee 02/16/2013  .  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 Routine general medical examination at a health care facility 03/27/2012  . Adenocarcinoma of colon ([place]) 04/12/2011  . Morbid obesity ([place]) 02/10/2011  . HTN (hypertension) 02/10/2011    Goals Addressed   None     Follow-Up:  Medication Cost ReviewReviewed chart for medication changes ahead of medication coordination call.  No OVs, Consults, or hospital visits since last care coordination call.   No medication changes indicated OR if recent visit, treatment plan here.  BP Readings from Last 3 Encounters:  02/27/20 (!) 151/66  11/28/19 (!) 144/81  11/13/19 (!) 153/79    Lab Results  Component Value Date   HGBA1C 5.3 02/16/2017     Patient obtains medications through [facility]  90 Days   I have been unable to reach this patient by phone.  Fanny Skates, [place] Pharmacist Assistant [phone]

## 2020-02-28 LAB — HEPATITIS C ANTIBODY
Hepatitis C Ab: NONREACTIVE
SIGNAL TO CUT-OFF: 0.03 (ref <1.00)

## 2020-02-29 ENCOUNTER — Telehealth: Payer: Self-pay | Admitting: Family

## 2020-02-29 DIAGNOSIS — E876 Hypokalemia: Secondary | ICD-10-CM

## 2020-02-29 MED ORDER — POTASSIUM CHLORIDE ER 10 MEQ PO TBCR: 10.0000 meq | EXTENDED_RELEASE_TABLET | Freq: Every day | ORAL | 1 refills | Status: DC

## 2020-02-29 NOTE — Telephone Encounter (Signed)
Results given to patient and she verbalized understanding and will start potassium. Was scheduled for labs in 1 week

## 2020-02-29 NOTE — Telephone Encounter (Signed)
Potassium is a little low. I would recommend that she add kdur once daily. Repeat bmet in 1 week.

## 2020-03-06 ENCOUNTER — Other Ambulatory Visit: Payer: Self-pay

## 2020-03-06 ENCOUNTER — Other Ambulatory Visit (INDEPENDENT_AMBULATORY_CARE_PROVIDER_SITE_OTHER): Payer: Medicare HMO

## 2020-03-06 DIAGNOSIS — E876 Hypokalemia: Secondary | ICD-10-CM | POA: Diagnosis not present

## 2020-03-06 LAB — BASIC METABOLIC PANEL
BUN: 13 mg/dL (ref 6–23)
CO2: 27 mEq/L (ref 19–32)
Calcium: 9.5 mg/dL (ref 8.4–10.5)
Chloride: 105 mEq/L (ref 96–112)
Creatinine, Ser: 1.16 mg/dL (ref 0.40–1.20)
GFR: 56.21 mL/min — ABNORMAL LOW (ref >60.00)
Glucose, Bld: 87 mg/dL (ref 70–99)
Potassium: 4 mEq/L (ref 3.5–5.1)
Sodium: 140 mEq/L (ref 135–145)

## 2020-03-06 NOTE — Progress Notes (Signed)
la 

## 2020-03-31 ENCOUNTER — Telehealth: Payer: Self-pay | Admitting: Pharmacist

## 2020-03-31 NOTE — Progress Notes (Addendum)
Chronic Care Management Pharmacy Assistant   Name: Jasmine Clayton  MRN: [id number] DOB: 1951/06/10  Reason for Encounter: Medication Review   PCP : Debbrah Alar, NP  Allergies:  No Known Allergies  Medications: Outpatient Encounter Medications as of 03/31/2020  Medication Sig  . aspirin 81 MG tablet Take 81 mg by mouth daily.    . B Complex Vitamins (B-COMPLEX/B-12 PO) Take 1 tablet by mouth daily.   . baclofen (LIORESAL) 10 MG tablet Take 0.5-1 tablets (5-10 mg total) by mouth 3 (three) times daily as needed for muscle spasms.  Marland Kitchen buPROPion (WELLBUTRIN XL) 300 MG 24 hr tablet Take 1 tab by mouth once daily  . Calcium Carb-Cholecalciferol (CALCIUM-VITAMIN D3) 600-500 MG-UNIT CAPS Take 1 tablet by mouth 2 (two) times daily.  Marland Kitchen FLUoxetine (PROZAC) 20 MG capsule TAKE ONE CAPSULE BY MOUTH WITH BREAKFAST EVERY DAY  . gabapentin (NEURONTIN) 100 MG capsule 1 PO q HS, may increase to 1 PO TID if needed  . hydrochlorothiazide (HYDRODIURIL) 12.5 MG tablet TAKE ONE TABLET BY MOUTH EVERY MORNING  . losartan (COZAAR) 50 MG tablet Take 1 tablet (50 mg total) by mouth daily.  . Omega-3 Fatty Acids (FISH OIL) 1200 MG CAPS Take 1 capsule by mouth daily.  . potassium chloride (KLOR-CON) 10 MEQ tablet Take 1 tablet (10 mEq total) by mouth daily.   Facility-Administered Encounter Medications as of 03/31/2020  Medication  . sodium chloride 0.9 % injection 10 mL    Current Diagnosis: Patient Active Problem List   Diagnosis Date Noted  . OSA (obstructive sleep apnea) 07/04/2018  . 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 Osteopenia   . Depression 08/25/2016  . Combined forms of age-related cataract of both eyes 06/21/2016  . Hyperopia of both eyes with astigmatism and presbyopia 06/21/2016  . Meibomian gland dysfunction 06/21/2016  . Vitreous syneresis of both eyes 06/21/2016  . Insomnia 11/28/2015  . Family history of uterine cancer   . Vaginal  dryness, menopausal 12/12/2014  . Hyperglycemia 01/07/2014  . Osteoarthritis, knee 02/16/2013  . 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 Routine general medical examination at a health care facility 03/27/2012  . Adenocarcinoma of colon ([place]) 04/12/2011  . Morbid obesity ([place]) 02/10/2011  . HTN (hypertension) 02/10/2011    Goals Addressed   None    Reviewed chart for medication changes ahead of medication coordination call.  Office Visit 02-27-2020: Patient presented in the office with Dr. Inda Castle for follow up. Blood work completed at this visit, PCP prescribed Pot Chl daily per TE 02-29-2020.  No Consults, or hospital visits since last care coordination call  Medication changes: Potassium Chloride ER 10MEq; one tab daily.  BP Readings from Last 3 Encounters:  02/27/20 (!) 151/66  11/28/19 (!) 144/81  11/13/19 (!) 153/79    Lab Results  Component Value Date   HGBA1C 5.3 02/16/2017     Patient obtains medications through Adherence Packaging  90 Days   Last adherence delivery included:  Gabapentin 100mg  -Vial   Baclofen 10mg  -Vial   Hydrochlorothiazide 12.5mg  daily-Breakfast   Fluoxetine 20mg  daily-Breakfast   Bupropion Hcl 300mg  Xl daily-Breakfast   Losartan Potassium 50mg  daily-Breakfast  Potassium Chl ER 54mEQ- - one tab with Breakfast  Patient declined Meloxicam 7.5mg  tab last month due to PRN use.  Patient is due for next adherence delivery on: 05-02-2020 Called patient and reviewed medications and coordinated delivery.  Coordinated acute fill for Gabapentin 100mg , Baclofen 10mg   Patient declined the following  medications: Hydrochlorothiazide 12.5mg  daily-Breakfast   Fluoxetine 20mg  daily-Breakfast   Bupropion Hcl 300mg  Xl daily-Breakfast   Losartan Potassium 50mg  daily-Breakfast  Potassium Chl ER 46mEQ- - one tab with Breakfast Patient has enough on hand; she is in packaging.  Patient needs refills for Fluoxetine 20mg .  Confirmed delivery date of 04-02-2020, advised  patient that pharmacy will contact them the morning of delivery.  Follow-Up:  Coordination of Enhanced Pharmacy Services   Fanny Skates, [place] Pharmacist Assistant [phone]  Reviewed by: De Blanch, PharmD Clinical Pharmacist [place] [facility] [phone]

## 2020-04-21 ENCOUNTER — Other Ambulatory Visit: Payer: Self-pay | Admitting: Family Medicine

## 2020-04-21 MED ORDER — GABAPENTIN 100 MG PO CAPS
ORAL_CAPSULE | ORAL | 3 refills | Status: DC
Start: 2020-04-21 — End: 2020-04-30

## 2020-04-28 ENCOUNTER — Telehealth: Payer: Self-pay | Admitting: Pharmacist

## 2020-04-28 NOTE — Progress Notes (Addendum)
Chronic Care Management Pharmacy Assistant   Name: Jasmine Clayton  MRN: [id number] DOB: 1950-08-08  Reason for Encounter: Medication Review    PCP : Debbrah Alar, NP  Allergies:  No Known Allergies  Medications: Outpatient Encounter Medications as of 04/28/2020  Medication Sig  . aspirin 81 MG tablet Take 81 mg by mouth daily.    . B Complex Vitamins (B-COMPLEX/B-12 PO) Take 1 tablet by mouth daily.   . baclofen (LIORESAL) 10 MG tablet Take 0.5-1 tablets (5-10 mg total) by mouth 3 (three) times daily as needed for muscle spasms.  Marland Kitchen buPROPion (WELLBUTRIN XL) 300 MG 24 hr tablet Take 1 tab by mouth once daily  . Calcium Carb-Cholecalciferol (CALCIUM-VITAMIN D3) 600-500 MG-UNIT CAPS Take 1 tablet by mouth 2 (two) times daily.  Marland Kitchen FLUoxetine (PROZAC) 20 MG capsule TAKE ONE CAPSULE BY MOUTH WITH BREAKFAST EVERY DAY  . gabapentin (NEURONTIN) 100 MG capsule 1 PO q HS, may increase to 1 PO TID if needed  . hydrochlorothiazide (HYDRODIURIL) 12.5 MG tablet TAKE ONE TABLET BY MOUTH EVERY MORNING  . losartan (COZAAR) 50 MG tablet Take 1 tablet (50 mg total) by mouth daily.  . Omega-3 Fatty Acids (FISH OIL) 1200 MG CAPS Take 1 capsule by mouth daily.  . potassium chloride (KLOR-CON) 10 MEQ tablet Take 1 tablet (10 mEq total) by mouth daily.   Facility-Administered Encounter Medications as of 04/28/2020  Medication  . sodium chloride 0.9 % injection 10 mL    Current Diagnosis: Patient Active Problem List   Diagnosis Date Noted  . OSA (obstructive sleep apnea) 07/04/2018  . 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 Osteopenia   . Depression 08/25/2016  . Combined forms of age-related cataract of both eyes 06/21/2016  . Hyperopia of both eyes with astigmatism and presbyopia 06/21/2016  . Meibomian gland dysfunction 06/21/2016  . Vitreous syneresis of both eyes 06/21/2016  . Insomnia 11/28/2015  . Family history of uterine cancer   . Vaginal  dryness, menopausal 12/12/2014  . Hyperglycemia 01/07/2014  . Osteoarthritis, knee 02/16/2013  . 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 Routine general medical examination at a health care facility 03/27/2012  . Adenocarcinoma of colon ([place]) 04/12/2011  . Morbid obesity ([place]) 02/10/2011  . HTN (hypertension) 02/10/2011    Goals Addressed   None    Reviewed chart for medication changes ahead of medication coordination call.  No OVs, Consults, or hospital visits since last care coordination call on 03-31-20.  No medication changes indicated OR if recent visit, treatment plan here.  BP Readings from Last 3 Encounters:  02/27/20 (!) 151/66  11/28/19 (!) 144/81  11/13/19 (!) 153/79    Lab Results  Component Value Date   HGBA1C 5.3 02/16/2017     Patient obtains medications through Adherence Packaging  90 Days   Last adherence delivery included: Gabapentin 100mg , Baclofen 10mg    Patient is due for next adherence delivery on: 05-02-2020 Called patient and reviewed medications and coordinated delivery.  This delivery to include: Gabapentin 100mg  -Vial   Baclofen 10mg  -Vial   Hydrochlorothiazide 12. 5mg  daily-Breakfast   Fluoxetine 20 mg daily-Breakfast   Bupropion Hcl 300 mg Xl daily-Breakfast   Losartan Potassium 50 mg daily-Breakfast  Potassium Chl ER 10 mEQ- - one tab with Breakfast  Patient needs refills for Fluoxetine 20mg .  Confirmed delivery date of 05-02-2020, advised patient that pharmacy will contact them the morning of delivery.  Follow-Up:  Coordination of Enhanced Pharmacy Services   [place]  [facility] at [place] Pharmacist Assistant [phone]  Reviewed by: De Blanch, PharmD Clinical Pharmacist [place] [facility] [phone]

## 2020-04-30 ENCOUNTER — Other Ambulatory Visit: Payer: Self-pay | Admitting: Family Medicine

## 2020-04-30 ENCOUNTER — Other Ambulatory Visit: Payer: Self-pay | Admitting: Family

## 2020-04-30 MED ORDER — GABAPENTIN 100 MG PO CAPS: ORAL_CAPSULE | ORAL | 3 refills | Status: AC

## 2020-05-05 ENCOUNTER — Other Ambulatory Visit: Payer: Self-pay | Admitting: Family

## 2020-05-05 ENCOUNTER — Ambulatory Visit: Payer: Medicare HMO | Admitting: Pharmacist

## 2020-05-05 DIAGNOSIS — I10 Essential (primary) hypertension: Secondary | ICD-10-CM

## 2020-05-05 DIAGNOSIS — F32A Depression, unspecified: Secondary | ICD-10-CM

## 2020-05-05 MED ORDER — FLUOXETINE HCL 40 MG PO CAPS: 40.0000 mg | ORAL_CAPSULE | Freq: Every day | ORAL | 0 refills | Status: DC

## 2020-05-05 MED ORDER — FLUOXETINE HCL 20 MG PO CAPS: 40.0000 mg | ORAL_CAPSULE | Freq: Every day | ORAL | 0 refills | Status: DC

## 2020-05-05 NOTE — Chronic Care Management (AMB) (Signed)
Chronic Care Management Pharmacy  Name: Jasmine Clayton  MRN: [id number] DOB: 06-07-1951   Chief Complaint/ HPI  Jasmine Clayton,  69 y.o. , female presents for their Follow-Up CCM visit with the clinical pharmacist via telephone due to COVID-19 Pandemic.  PCP : Debbrah Alar, NP  Their chronic conditions include: Pre-DM, HTN, Depression, Osteoarthritis, Osteopenia, OSA, Insomnia  Office Visits: 02/27/20: Visit w/ Debbrah Alar, NP - Depression. PHQ9 = 8. Hep C = non-reactive. Discussed referral to medical weight management. Pt refused. Potassium low. Started potassium 78mEq daily. RTC 3 months.  Consult Visit: None since last CCM visit on 02/01/20.   Medications: Outpatient Encounter Medications as of 05/05/2020  Medication Sig  . aspirin 81 MG tablet Take 81 mg by mouth daily.    . B Complex Vitamins (B-COMPLEX/B-12 PO) Take 1 tablet by mouth daily.   . baclofen (LIORESAL) 10 MG tablet Take 0.5-1 tablets (5-10 mg total) by mouth 3 (three) times daily as needed for muscle spasms.  Marland Kitchen buPROPion (WELLBUTRIN XL) 300 MG 24 hr tablet Take 1 tab by mouth once daily  . Calcium Carb-Cholecalciferol (CALCIUM-VITAMIN D3) 600-500 MG-UNIT CAPS Take 1 tablet by mouth 2 (two) times daily.  Marland Kitchen FLUoxetine (PROZAC) 20 MG capsule TAKE ONE CAPSULE BY MOUTH EVERY MORNING WITH BREAKFAST  . gabapentin (NEURONTIN) 100 MG capsule 1 PO q HS, may increase to 1 PO TID if needed  . hydrochlorothiazide (HYDRODIURIL) 12.5 MG tablet TAKE ONE TABLET BY MOUTH EVERY MORNING  . losartan (COZAAR) 50 MG tablet Take 1 tablet (50 mg total) by mouth daily.  . Omega-3 Fatty Acids (FISH OIL) 1200 MG CAPS Take 1 capsule by mouth daily.  . potassium chloride (KLOR-CON) 10 MEQ tablet Take 1 tablet (10 mEq total) by mouth daily.   Facility-Administered Encounter Medications as of 05/05/2020  Medication  . sodium chloride 0.9 % injection 10 mL   SDOH Screenings   Alcohol Screen:   . Last Alcohol Screening  Score (AUDIT): Not on file  Depression (PHQ2-9): Medium Risk  . PHQ-2 Score: 8  Financial Resource Strain: Low Risk   . Difficulty of Paying Living Expenses: Not very hard  Food Insecurity:   . Worried About Charity fundraiser in the Last Year: Not on file  . Ran Out of Food in the Last Year: Not on file  Housing:   . Last Housing Risk Score: Not on file  Physical Activity:   . Days of Exercise per Week: Not on file  . Minutes of Exercise per Session: Not on file  Social Connections:   . Frequency of Communication with Friends and Family: Not on file  . Frequency of Social Gatherings with Friends and Family: Not on file  . Attends Religious Services: Not on file  . Active Member of Clubs or Organizations: Not on file  . Attends Archivist Meetings: Not on file  . Marital Status: Not on file  Stress:   . Feeling of Stress : Not on file  Tobacco Use: Low Risk   . Smoking Tobacco Use: Never Smoker  . Smokeless Tobacco Use: Never Used  Transportation Needs:   . Film/video editor (Medical): Not on file  . Lack of Transportation (Non-Medical): Not on file     Current Diagnosis/Assessment:  Goals Addressed            This Visit's Progress   . Chronic Care Management Pharmacy Care Plan       CARE PLAN ENTRY (see  longitudinal plan of care for additional care plan information)  Current Barriers:  . Chronic Disease Management support, education, and care coordination needs related to Pre-DM, HTN, Depression, Osteoarthritis, Osteopenia, OSA, Insomnia   Hypertension BP Readings from Last 3 Encounters:  02/27/20 (!) 151/66  11/28/19 (!) 144/81  11/13/19 (!) 153/79   . Pharmacist Clinical Goal(s): o Over the next 90 days, patient will work with PharmD and providers to maintain BP goal <140/90 . Current regimen:   Losartan 50mg  daily  Hctz 12.5mg  daily . Interventions: o Requested patient to purchase blood pressure cuff and check BP 3-4 times per  week . Patient self care activities - Over the next 90 days, patient will: o Check BP 3-4 times per week, document, and provide at future appointments o Ensure daily salt intake < 2300 mg/Alonza Knisley  Hyperlipidemia Lab Results  Component Value Date/Time   LDLCALC 109 (H) 08/25/2016 08:05 AM   . Pharmacist Clinical Goal(s): o Over the next 90 days, patient will work with PharmD and providers to achieve LDL goal < 100 . Current regimen:  o Fish oil 1200mg  daily . Interventions: o Consider completing lipid panel at next visit . Patient self care activities - Over the next 180 days, patient will: o Complete lipid panel for further assessment of hyperlipidemia risk   Pre-Diabetes Lab Results  Component Value Date/Time   HGBA1C 5.3 02/16/2017 09:14 AM   HGBA1C 5.7 04/23/2016 08:56 AM   . Pharmacist Clinical Goal(s): o Over the next 90 days, patient will work with PharmD and providers to maintain A1c goal <6.5% . Current regimen:  o Diet and exercise management   . Interventions: o Consider completing a1c at next visit . Patient self care activities - Over the next 180 days, patient will: o Complete a1c for further assessment of pre-diabetes/diabetes risk  Depression . Pharmacist Clinical Goal(s) o Over the next 90 days, patient will work with PharmD and providers to reduce symptoms of depression  . Current regimen:   Fluoxetine 20mg  daily  Bupropion 300mg  XL daily . Interventions: o Collaboration with provider regarding medication management (escitalopram switch to fluoxetine) o Collaboration with provider regarding medication management (fluoxetine increase to 40mg  daily) . Patient self care activities - Over the next 90 days, patient will: o Maintain depression medication regimen.   Medication management . Pharmacist Clinical Goal(s): o Over the next 90 days, patient will work with PharmD and providers to maintain optimal medication adherence . Current pharmacy:  [facility] . Interventions o Comprehensive medication review performed. o Utilize [facility] pharmacy for medication synchronization, packaging and delivery . Patient self care activities - Over the next 90 days, patient will: o Focus on medication adherence by filling and taking medications appropriately o Take medications as prescribed o Report any questions or concerns to PharmD and/or provider(s)  Please see past updates related to this goal by clicking on the "Past Updates" button in the selected goal         Hypertension   Blood Pressure Goal <140/90  Office blood pressures are  BP Readings from Last 3 Encounters:  02/27/20 (!) 151/66  11/28/19 (!) 144/81  11/13/19 (!) 153/79    Patient has failed these meds in the past: None noted  Patient is currently uncontrolled on the following medications:    Losartan 50mg  daily  Hctz 12.5mg  daily  Patient checks BP at home 1-2x per week   From 11/27/19 CCM Visit BP cuff is too small. Was going to take it back, but threw  the whole cuff away on accident.  She states she will start using wrist cuff from her sister. We discussed that the wrist cuff may not be accurate   Update 12/26/19 States she did purchase a new cuff, but is still working on getting it to work properly.   Update 02/01/20 137 79 143 78 120 72 129 73 144  78 Average 134.6 76  Update 05/05/20 Admits her BP is elevated most likely due to pain. Admits to some headaches, but denies chest pains and dizziness. 149 95 150 78 142 78 119 78 Avg 140 82.25  Plan -Check blood pressure 3-4 times per week and record -Continue current meidcations  Osteoarthritis   Patient has failed these meds in past: None noted   Patient is currently uncontrolled on the following medications:   Baclofen 10mg  three times daily as needed  Gabapentin 100mg  three times daily as needed   Meloxicam was stopped due to decline in kidney function. Noted pt also recently increased  losartan which could contribute to increase in creatinine. Pt referred to Dr. Junius Roads (ortho)  Baclofen  Uses twice daily States she needs a refill on this   Gabapentin Uses 3 times daily States she will need a refill soon on this  Feels like her pain is improving. Feels like certain activities or shoes may cause more pain.  Does not have a follow up with Dr. Junius Roads.  We discussed:  Encouraged follow up with Dr. Junius Roads to help manage pain and discuss treatment options  Update 02/01/20 Feeling very good  Update 05/05/20 We discussed possible follow up with Dr. Junius Roads, but she would like to wait. Admits her knees are hurting more due to walking more with her grandsons. Taking 2-3 gabapentin daily Taking baclofen twice daily  Plan -Continue current medications -Follow up with Dr. Junius Roads  Depression   Depression screen [facility] 2/9 05/05/2020 02/27/2020 11/13/2019  Decreased Interest 1 1 1   Down, Depressed, Hopeless 2 1 1   PHQ - 2 Score 3 2 2   Altered sleeping 1 1 1   Tired, decreased energy 1 1 1   Change in appetite 1 2 1   Feeling bad or failure about yourself  1 1 1   Trouble concentrating 1 0 0  Moving slowly or fidgety/restless 0 1 1  Suicidal thoughts 0 0 0  PHQ-9 Score 8 8 7   Difficult doing work/chores Somewhat difficult Somewhat difficult Somewhat difficult  Some recent data might be hidden    Patient has failed these meds in past: None noted  Patient is currently controlled on the following medications:   Fluoxetine 20mg  daily  Bupropion 300mg  XL daily  Update 02/01/20 PHQ9 = 3 (down from 7 on 11/13/19)  Update 05/05/20 PHQ9 = 8 and unchanged from last office visit.  We discussed options and patient is interested in increasing dose of fluoxetine.  Will consult with Melissa regarding the increase  Plan -Increase fluoxetine to 40mg  daily per [facility] approval [if patient's meds already packaged send 90 DS of additional 20mg  tab (new script for fluoxetine 20mg  #2 daily)  then fill fluoxetine 40mg  #1 daily at next fill]    [facility] Reviewed patient's [facility] medication and Epic medication profile assuring there are no discrepancies or gaps in therapy. Confirmed all fill dates appropriate and verified with patient that there is a sufficient quantity of all prescribed medications at home. Informed patient to call me any time if needing medications before scheduled deliveries. The next anticipated medication sync date is 07/29/20.   Melvenia Beam Mazel Villela,  PharmD Clinical Pharmacist [place] [facility] at [facility] [phone]

## 2020-05-05 NOTE — Patient Instructions (Addendum)
Visit Information  Goals Addressed            This Visit's Progress   . Chronic Care Management Pharmacy Care Plan       CARE PLAN ENTRY (see longitudinal plan of care for additional care plan information)  Current Barriers:  . Chronic Disease Management support, education, and care coordination needs related to Pre-DM, HTN, Depression, Osteoarthritis, Osteopenia, OSA, Insomnia   Hypertension BP Readings from Last 3 Encounters:  02/27/20 (!) 151/66  11/28/19 (!) 144/81  11/13/19 (!) 153/79   . Pharmacist Clinical Goal(s): o Over the next 90 days, patient will work with PharmD and providers to maintain BP goal <140/90 . Current regimen:   Losartan 50mg  daily  Hctz 12.5mg  daily . Interventions: o Requested patient to purchase blood pressure cuff and check BP 3-4 times per week . Patient self care activities - Over the next 90 days, patient will: o Check BP 3-4 times per week, document, and provide at future appointments o Ensure daily salt intake < 2300 mg/Jasmine Clayton  Hyperlipidemia Lab Results  Component Value Date/Time   LDLCALC 109 (H) 08/25/2016 08:05 AM   . Pharmacist Clinical Goal(s): o Over the next 90 days, patient will work with PharmD and providers to achieve LDL goal < 100 . Current regimen:  o Fish oil 1200mg  daily . Interventions: o Consider completing lipid panel at next visit . Patient self care activities - Over the next 180 days, patient will: o Complete lipid panel for further assessment of hyperlipidemia risk   Pre-Diabetes Lab Results  Component Value Date/Time   HGBA1C 5.3 02/16/2017 09:14 AM   HGBA1C 5.7 04/23/2016 08:56 AM   . Pharmacist Clinical Goal(s): o Over the next 90 days, patient will work with PharmD and providers to maintain A1c goal <6.5% . Current regimen:  o Diet and exercise management   . Interventions: o Consider completing a1c at next visit . Patient self care activities - Over the next 180 days, patient will: o Complete a1c  for further assessment of pre-diabetes/diabetes risk  Depression . Pharmacist Clinical Goal(s) o Over the next 90 days, patient will work with PharmD and providers to reduce symptoms of depression  . Current regimen:   Fluoxetine 20mg  daily  Bupropion 300mg  XL daily . Interventions: o Collaboration with provider regarding medication management (escitalopram switch to fluoxetine) o Collaboration with provider regarding medication management (fluoxetine increase to 40mg  daily) . Patient self care activities - Over the next 90 days, patient will: o Maintain depression medication regimen.   Medication management . Pharmacist Clinical Goal(s): o Over the next 90 days, patient will work with PharmD and providers to maintain optimal medication adherence . Current pharmacy: [facility] . Interventions o Comprehensive medication review performed. o Utilize [facility] pharmacy for medication synchronization, packaging and delivery . Patient self care activities - Over the next 90 days, patient will: o Focus on medication adherence by filling and taking medications appropriately o Take medications as prescribed o Report any questions or concerns to PharmD and/or provider(s)  Please see past updates related to this goal by clicking on the "Past Updates" button in the selected goal         The patient verbalized understanding of instructions provided today and agreed to receive a mailed copy of patient instruction and/or educational materials.  Telephone follow up appointment with pharmacy team member scheduled for: 06/12/2020  Jasmine Clayton, PharmD Clinical Pharmacist [place] [facility] at [facility] [phone]

## 2020-05-21 ENCOUNTER — Telehealth: Payer: Self-pay | Admitting: Pharmacist

## 2020-05-21 NOTE — Progress Notes (Addendum)
Chronic Care Management Pharmacy Assistant   Name: Jasmine Clayton  MRN: [id number] DOB: March 09, 1951  Reason for Encounter: Medication Review  Patient Questions:  1.  Have you seen any other providers since your last visit? No  2.  Any changes in your medicines or health? No  PCP : Debbrah Alar, NP   Their chronic conditions include: Pre-DM, HTN, Depression, Osteoarthritis, Osteopenia, OSA, Insomnia  No Office Visits, Consults, or Hospitalizations since last CPP visit on 05-05-20.  Allergies:  No Known Allergies  Medications: Outpatient Encounter Medications as of 05/21/2020  Medication Sig   aspirin 81 MG tablet Take 81 mg by mouth daily.     B Complex Vitamins (B-COMPLEX/B-12 PO) Take 1 tablet by mouth daily.    baclofen (LIORESAL) 10 MG tablet Take 0.5-1 tablets (5-10 mg total) by mouth 3 (three) times daily as needed for muscle spasms.   buPROPion (WELLBUTRIN XL) 300 MG 24 hr tablet Take 1 tab by mouth once daily   Calcium Carb-Cholecalciferol (CALCIUM-VITAMIN D3) 600-500 MG-UNIT CAPS Take 1 tablet by mouth 2 (two) times daily.   FLUoxetine (PROZAC) 20 MG capsule Take 2 capsules (40 mg total) by mouth daily. TAKE ONE CAPSULE BY MOUTH EVERY MORNING WITH BREAKFAST   FLUoxetine (PROZAC) 40 MG capsule Take 1 capsule (40 mg total) by mouth daily.   gabapentin (NEURONTIN) 100 MG capsule 1 PO q HS, may increase to 1 PO TID if needed   hydrochlorothiazide (HYDRODIURIL) 12.5 MG tablet TAKE ONE TABLET BY MOUTH EVERY MORNING   losartan (COZAAR) 50 MG tablet Take 1 tablet (50 mg total) by mouth daily.   Omega-3 Fatty Acids (FISH OIL) 1200 MG CAPS Take 1 capsule by mouth daily.   potassium chloride (KLOR-CON) 10 MEQ tablet Take 1 tablet (10 mEq total) by mouth daily.   Facility-Administered Encounter Medications as of 05/21/2020  Medication   sodium chloride 0.9 % injection 10 mL    Current Diagnosis: Patient Active Problem List   Diagnosis Date Noted   OSA  (obstructive sleep apnea) 07/04/2018   Chronic bilateral low back pain with right-sided sciatica 04/05/2017   Osteopenia    Depression 08/25/2016   Combined forms of age-related cataract of both eyes 06/21/2016   Hyperopia of both eyes with astigmatism and presbyopia 06/21/2016   Meibomian gland dysfunction 06/21/2016   Vitreous syneresis of both eyes 06/21/2016   Insomnia 11/28/2015   Family history of uterine cancer    Vaginal dryness, menopausal 12/12/2014   Hyperglycemia 01/07/2014   Osteoarthritis, knee 02/16/2013   Cornea scar 08/28/2012   Routine general medical examination at a health care facility 03/27/2012   Adenocarcinoma of colon ([place]) 04/12/2011   Morbid obesity ([place]) 02/10/2011   HTN (hypertension) 02/10/2011    Goals Addressed   None    Reviewed chart for medication changes ahead of medication coordination call.  No OVs, Consults, or hospital visits since last care coordination call/Pharmacist visit.   No medication changes indicated OR if recent visit, treatment plan here.  BP Readings from Last 3 Encounters:  02/27/20 (!) 151/66  11/28/19 (!) 144/81  11/13/19 (!) 153/79    Lab Results  Component Value Date   HGBA1C 5.3 02/16/2017     Patient obtains medications through Adherence Packaging  90 Days   Last adherence delivery included:  Gabapentin 100mg  -Vial   Baclofen 10mg  -Vial   Hydrochlorothiazide 12.5 mg daily-Breakfast   Fluoxetine 20 mg daily-Breakfast   Bupropion Hcl 300 mg Xl daily-Breakfast  Losartan Potassium 50 mg daily-Breakfast  Potassium Chl ER 10 mEQ- - one tab with Breakfast   Patient is due for next adherence delivery on: 06-01-2020 Called patient and reviewed medications and coordinated delivery.  This delivery to include: Baclofen 10mg  -Vial  Patient declined the following medications due to 90 day supply on 05-02-20. Gabapentin 100mg  -Vial   Baclofen 10mg  -Vial   Hydrochlorothiazide 12.5 mg daily-Breakfast     Fluoxetine 20 mg daily-Breakfast   Bupropion Hcl 300 mg Xl daily-Breakfast   Losartan Potassium 50 mg daily-Breakfast  Potassium Chl ER 10 mEQ- - one tab with Breakfast  Baclofen does need to be refilled.  Request script from PCP nurse.   Follow-Up:  Comptroller and Pharmacist Review  Ok to leave on porch if not home  [place], [facility] [phone]  Reviewed by: De Blanch, PharmD Clinical Pharmacist [place] [facility] [phone]

## 2020-05-23 ENCOUNTER — Other Ambulatory Visit: Payer: Self-pay

## 2020-05-23 MED ORDER — BACLOFEN 10 MG PO TABS: 5.0000 mg | ORAL_TABLET | Freq: Three times a day (TID) | ORAL | 3 refills | Status: AC | PRN

## 2020-05-30 ENCOUNTER — Other Ambulatory Visit: Payer: Self-pay

## 2020-05-30 ENCOUNTER — Ambulatory Visit (INDEPENDENT_AMBULATORY_CARE_PROVIDER_SITE_OTHER): Payer: Medicare HMO | Admitting: Family

## 2020-05-30 VITALS — BP 105/58 | HR 93 | Temp 98.5°F | Resp 16 | Wt 210.0 lb

## 2020-05-30 DIAGNOSIS — I10 Essential (primary) hypertension: Secondary | ICD-10-CM | POA: Diagnosis not present

## 2020-05-30 DIAGNOSIS — F32A Depression, unspecified: Secondary | ICD-10-CM | POA: Diagnosis not present

## 2020-05-30 NOTE — Patient Instructions (Signed)
Stop hydrochlorathiazide. Stop Potassium

## 2020-05-30 NOTE — Progress Notes (Signed)
Subjective:    Patient ID: Jasmine Clayton, female    DOB: Jan 14, 1951, 69 y.o.   MRN: [id number]  HPI  Patient is a 69 yr old female who presents today for follow up.  HTN- maintained on losartan 50mg .  BP Readings from Last 3 Encounters:  05/30/20 (!) 105/58  02/27/20 (!) 151/66  11/28/19 (!) 144/81   Depression- maintaiend on prozac 20mg  and wellbutrin xl 300mg . Repots mood is improved. States she has had more time to herself which she has been enjoying.     Review of Systems See HPI  Past Medical History:  Diagnosis Date  . Adenocarcinoma of colon ([place]) 04/12/2011   "rectal-colon"  . Corneal ulcers and infections 07/04/12  . Degenerative joint disease    right knee  . DVT (deep vein thrombosis) in pregnancy 1979   during pregnancy. lower extremity  . Family history of colon cancer   . Family history of uterine cancer   . History of chemotherapy     cycle 5 Folfox completed 09/23/11  . History of radiation therapy 10/25/11 -12/01/11   pelvis/presacral region  . Morbid obesity ([place])   . OSA (obstructive sleep apnea) 07/04/2018   Severe per split night study 11/19  Trial of CPAP therapy on 12 cm H2O with a Wide size Resmed Nasal Mask AirFit N10 mask and heated humidification.  . Osteopenia   . 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 Stress incontinence, female    not current     Social History   Socioeconomic History  . Marital status: Widowed    Spouse name: Not on file  . Number of children: 1  . Years of education: Not on file  . Highest education level: Not on file  Occupational History  . 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 Smoking status: Never Smoker  . Smokeless tobacco: Never Used  Vaping Use  . Vaping Use: Never used  Substance and Sexual Activity  . Alcohol use: Yes    Alcohol/week: 0.0 standard drinks    Comment: occ  . Drug use: No  .  Sexual activity: Not on file  Other Topics Concern  . Not on file  Social History Narrative   Regular exercise:  No   Caffeine Use:  1 cup coffee daily   Husband passed 2/17   Daughter, son-in-law and her 2 children- they recently moved out   Completed the 12th grade, and some college.   Does not work.    Social Determinants of Health   Financial Resource Strain: Low Risk   . Difficulty of Paying Living Expenses: Not very hard  Food Insecurity:   . Worried About Charity fundraiser in the Last Year: Not on file  . Ran Out of Food in the Last Year: Not on file  Transportation Needs:   . Lack of Transportation (Medical): Not on file  . Lack of Transportation (Non-Medical): Not on file  Physical Activity:   . Days of Exercise per Week: Not on file  . Minutes of Exercise per Session: Not on file  Stress:   . Feeling of Stress : Not on file  Social Connections:   . Frequency of Communication with Friends and Family: Not on file  . Frequency of Social Gatherings with Friends and Family: Not on file  . Attends Religious Services: Not on file  . Active Member of  Clubs or Organizations: Not on file  . Attends Archivist Meetings: Not on file  . Marital Status: Not on file  Intimate Partner Violence:   . Fear of Current or Ex-Partner: Not on file  . Emotionally Abused: Not on file  . Physically Abused: Not on file  . Sexually Abused: Not on file    Past Surgical History:  Procedure Laterality Date  . ABDOMINAL HYSTERECTOMY  3 years ago  . BREAST CYST EXCISION Right 1978  . CESAREAN SECTION    . COLON SURGERY     [place], reoperation for necrotic ileostomy, transverse colostomy  . COLOSTOMY  05/27/11  . COLOSTOMY TAKEDOWN  07/11/2012   Procedure: COLOSTOMY TAKEDOWN;  Surgeon: Rolm Bookbinder, MD;  Location: [facility];  Service: General;  Laterality: N/A;  colostomy takedown  . JOINT REPLACEMENT  2002   hip replacement  . [place]  . MASS EXCISION  2011    "mass removed from ovaries"  . PORT-A-CATH REMOVAL  07/11/2012   Procedure: REMOVAL PORT-A-CATH;  Surgeon: Rolm Bookbinder, MD;  Location: [facility];  Service: General;  Laterality: N/A;  removal portacath  . PORTACATH PLACEMENT  07/26/11   right portacath  . TOTAL ABDOMINAL HYSTERECTOMY W/ BILATERAL SALPINGOOPHORECTOMY  11/30/09   BSO  . TOTAL HIP ARTHROPLASTY  2002   left hip    Family History  Problem Relation Age of Onset  . Uterine cancer Mother 54       deceased 42  . Diabetes Father   . Colon cancer Sister 1       currently 3  . Alcoholism Brother     No Known Allergies  Current Outpatient Medications on File Prior to Visit  Medication Sig Dispense Refill  . aspirin 81 MG tablet Take 81 mg by mouth daily.      . B Complex Vitamins (B-COMPLEX/B-12 PO) Take 1 tablet by mouth daily.     . baclofen (LIORESAL) 10 MG tablet Take 0.5-1 tablets (5-10 mg total) by mouth 3 (three) times daily as needed for muscle spasms. 30 each 3  . buPROPion (WELLBUTRIN XL) 300 MG 24 hr tablet Take 1 tab by mouth once daily 90 tablet 1  . Calcium Carb-Cholecalciferol (CALCIUM-VITAMIN D3) 600-500 MG-UNIT CAPS Take 1 tablet by mouth 2 (two) times daily. 60 capsule 0  . FLUoxetine (PROZAC) 20 MG capsule Take 2 capsules (40 mg total) by mouth daily. TAKE ONE CAPSULE BY MOUTH EVERY MORNING WITH BREAKFAST 180 capsule 0  . FLUoxetine (PROZAC) 40 MG capsule Take 1 capsule (40 mg total) by mouth daily. 90 capsule 0  . gabapentin (NEURONTIN) 100 MG capsule 1 PO q HS, may increase to 1 PO TID if needed 90 capsule 3  . hydrochlorothiazide (HYDRODIURIL) 12.5 MG tablet TAKE ONE TABLET BY MOUTH EVERY MORNING 90 tablet 1  . losartan (COZAAR) 50 MG tablet Take 1 tablet (50 mg total) by mouth daily. 90 tablet 1  . Omega-3 Fatty Acids (FISH OIL) 1200 MG CAPS Take 1 capsule by mouth daily.    . potassium chloride (KLOR-CON) 10 MEQ tablet Take 1 tablet (10 mEq total) by mouth daily. 90 tablet 1   Current  Facility-Administered Medications on File Prior to Visit  Medication Dose Route Frequency Provider Last Rate Last Admin  . sodium chloride 0.9 % injection 10 mL  10 mL Intravenous PRN [name], MD   10 mL at 05/25/12 1153    BP (!) 105/58 (BP Location: Right Arm, Patient  Position: Sitting, Cuff Size: Large)   Pulse 93   Temp 98.5 F (36.9 C) (Oral)   Resp 16   Wt 210 lb (95.3 kg)   LMP 11/30/2009   SpO2 97%   BMI 41.01 kg/m       Objective:   Physical Exam Constitutional:      Appearance: She is well-developed.  Neck:     Thyroid: No thyromegaly.  Cardiovascular:     Rate and Rhythm: Normal rate and regular rhythm.     Heart sounds: Normal heart sounds. No murmur heard.   Pulmonary:     Effort: Pulmonary effort is normal. No respiratory distress.     Breath sounds: Normal breath sounds. No wheezing.  Musculoskeletal:     Cervical back: Neck supple.  Skin:    General: Skin is warm and dry.  Neurological:     Mental Status: She is alert and oriented to person, place, and time.  Psychiatric:        Behavior: Behavior normal.        Thought Content: Thought content normal.        Judgment: Judgment normal.           Assessment & Plan:  HTN- bp is overtreated. Will d/c hctz and Kdur. Plan follow up in 2 weeks for bp recheck and bmet.  Depression- stable/improved.  Continue maintaiend on prozac 20mg  and wellbutrin xl 300mg  Depression screen [facility] 2/9 05/30/2020 05/05/2020 02/27/2020 11/13/2019 10/11/2019  Decreased Interest 1 1 1 1 1   Down, Depressed, Hopeless 0 2 1 1 2   PHQ - 2 Score 1 3 2 2 3   Altered sleeping 0 1 1 1 3   Tired, decreased energy 1 1 1 1 3   Change in appetite 1 1 2 1 1   Feeling bad or failure about yourself  0 1 1 1 1   Trouble concentrating 0 1 0 0 0  Moving slowly or fidgety/restless 1 0 1 1 3   Suicidal thoughts 0 0 0 0 0  PHQ-9 Score 4 8 8 7 14   Difficult doing work/chores Somewhat difficult Somewhat difficult Somewhat difficult Somewhat  difficult Very difficult  Some recent data might be hidden    This visit occurred during the SARS-CoV-2 public health emergency.  Safety protocols were in place, including screening questions prior to the visit, additional usage of staff PPE, and extensive cleaning of exam room while observing appropriate contact time as indicated for disinfecting solutions.

## 2020-06-11 ENCOUNTER — Encounter: Payer: Self-pay | Admitting: Family

## 2020-06-11 ENCOUNTER — Ambulatory Visit (INDEPENDENT_AMBULATORY_CARE_PROVIDER_SITE_OTHER): Payer: Medicare HMO | Admitting: Family

## 2020-06-11 ENCOUNTER — Other Ambulatory Visit: Payer: Self-pay

## 2020-06-11 VITALS — BP 111/60 | HR 73 | Temp 98.8°F | Resp 16 | Ht 60.0 in | Wt 213.0 lb

## 2020-06-11 DIAGNOSIS — I10 Essential (primary) hypertension: Secondary | ICD-10-CM

## 2020-06-11 LAB — BASIC METABOLIC PANEL
BUN: 17 mg/dL (ref 6–23)
CO2: 29 mEq/L (ref 19–32)
Calcium: 9.3 mg/dL (ref 8.4–10.5)
Chloride: 104 mEq/L (ref 96–112)
Creatinine, Ser: 1.51 mg/dL — ABNORMAL HIGH (ref 0.40–1.20)
GFR: 35.36 mL/min — ABNORMAL LOW (ref >60.00)
Glucose, Bld: 79 mg/dL (ref 70–99)
Potassium: 3.5 mEq/L (ref 3.5–5.1)
Sodium: 141 mEq/L (ref 135–145)

## 2020-06-11 MED ORDER — HYDROCHLOROTHIAZIDE 25 MG PO TABS: 25.0000 mg | ORAL_TABLET | Freq: Every day | ORAL | 3 refills | Status: DC

## 2020-06-11 NOTE — Patient Instructions (Signed)
Please complete lab work prior to leaving.   

## 2020-06-11 NOTE — Progress Notes (Signed)
Subjective:    Patient ID: Jasmine Clayton, female    DOB: Dec 04, 1950, 69 y.o.   MRN: [id number]  HPI  Patient is a 69 yr old female who presents today for follow up.  HTN- last visit bp appeared overtreated. Patient was advised to d/c hctz and kdur. She states that she only stopped the [place].   BP Readings from Last 3 Encounters:  06/11/20 111/60  05/30/20 (!) 105/58  02/27/20 (!) 151/66      Review of Systems    see HPI  Past Medical History:  Diagnosis Date   Adenocarcinoma of colon ([place]) 04/12/2011   "rectal-colon"   Corneal ulcers and infections 07/04/12   Degenerative joint disease    right knee   DVT (deep vein thrombosis) in pregnancy 1979   during pregnancy. lower extremity   Family history of colon cancer    Family history of uterine cancer    History of chemotherapy     cycle 5 Folfox completed 09/23/11   History of radiation therapy 10/25/11 -12/01/11   pelvis/presacral region   Morbid obesity ([place])    OSA (obstructive sleep apnea) 07/04/2018   Severe per split night study 11/19  Trial of CPAP therapy on 12 cm H2O with a Wide size Resmed Nasal Mask AirFit N10 mask and heated humidification.   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   Does not work.    Social Determinants of Health   Financial Resource Strain: Low Risk    Difficulty of Paying Living Expenses: Not very hard  Food Insecurity:    Worried About Charity fundraiser in the Last Year: Not on file   [employer] of Food in the Last Year: Not on file  Transportation Needs:    Lack of Transportation (Medical): Not on file   Lack of Transportation (Non-Medical): Not on file  Physical Activity:    Days of Exercise per Week: Not on file   Minutes of Exercise per Session: Not on file  Stress:    Feeling of Stress : Not on file  Social Connections:    Frequency of Communication with Friends and Family: Not on file   Frequency of Social Gatherings with Friends and Family: Not on file   Attends Religious Services: Not on file   Active Member of Clubs or Organizations: Not on file  Attends Archivist Meetings: Not on file   Marital Status: Not on file  Intimate Partner Violence:    Fear of Current or Ex-Partner: Not on file   Emotionally Abused: Not on file   Physically Abused: Not on file   Sexually Abused: Not on file    Past Surgical History:  Procedure Laterality Date   ABDOMINAL HYSTERECTOMY  3 years ago   BREAST CYST EXCISION Right [place], reoperation for necrotic ileostomy, transverse colostomy   COLOSTOMY  05/27/11   COLOSTOMY TAKEDOWN  07/11/2012   Procedure: COLOSTOMY TAKEDOWN;  Surgeon: Rolm Bookbinder, MD;  [facility]: [facility];  Service: General;  Laterality: N/A;  colostomy takedown   JOINT REPLACEMENT  2002   hip replacement   [place]  2011   "mass removed from ovaries"   PORT-A-CATH REMOVAL  07/11/2012    Procedure: REMOVAL PORT-A-CATH;  Surgeon: Rolm Bookbinder, MD;  Location: [facility];  Service: General;  Laterality: N/A;  removal portacath   PORTACATH PLACEMENT  07/26/11   right portacath   TOTAL ABDOMINAL HYSTERECTOMY W/ BILATERAL SALPINGOOPHORECTOMY  11/30/09   BSO   TOTAL HIP ARTHROPLASTY  2002   left hip    Family History  Problem Relation Age of Onset   Uterine cancer Mother 1       deceased 37   Diabetes Father    Colon cancer Sister 43       currently 23   Alcoholism Brother     No Known Allergies  Current Outpatient Medications on File Prior to Visit  Medication Sig Dispense Refill   aspirin 81 MG tablet Take 81 mg by mouth daily.       B Complex Vitamins (B-COMPLEX/B-12 PO) Take 1 tablet by mouth daily.      baclofen (LIORESAL) 10 MG tablet Take 0.5-1 tablets (5-10 mg total) by mouth 3 (three) times daily as needed for muscle spasms. 30 each 3   buPROPion (WELLBUTRIN XL) 300 MG 24 hr tablet Take 1 tab by mouth once daily 90 tablet 1   Calcium Carb-Cholecalciferol (CALCIUM-VITAMIN D3) 600-500 MG-UNIT CAPS Take 1 tablet by mouth 2 (two) times daily. 60 capsule 0   FLUoxetine (PROZAC) 20 MG capsule Take 2 capsules (40 mg total) by mouth daily. TAKE ONE CAPSULE BY MOUTH EVERY MORNING WITH BREAKFAST 180 capsule 0   FLUoxetine (PROZAC) 40 MG capsule Take 1 capsule (40 mg total) by mouth daily. 90 capsule 0   gabapentin (NEURONTIN) 100 MG capsule 1 PO q HS, may increase to 1 PO TID if needed 90 capsule 3   losartan (COZAAR) 50 MG tablet Take 1 tablet (50 mg total) by mouth daily. 90 tablet 1   Omega-3 Fatty Acids (FISH OIL) 1200 MG CAPS Take 1 capsule by mouth daily.     Current Facility-Administered Medications on File Prior to Visit  Medication Dose Route Frequency Provider Last Rate Last Admin   sodium chloride 0.9 % injection 10 mL  10 mL Intravenous PRN [name], MD   10 mL at 05/25/12 1153    BP 111/60 (BP Location: Right Arm, Patient  Position: Sitting, Cuff Size: Large)    Pulse 73    Temp 98.8 F (37.1 C) (Oral)    Resp 16    Ht 5' (1.524 m)    Wt 213 lb (96.6 kg)    LMP 11/30/2009  SpO2 97%    BMI 41.60 kg/m    Objective:   Physical Exam Constitutional:      Appearance: She is well-developed.  Neck:     Thyroid: No thyromegaly.  Cardiovascular:     Rate and Rhythm: Normal rate and regular rhythm.     Heart sounds: Normal heart sounds. No murmur heard.   Pulmonary:     Effort: Pulmonary effort is normal. No respiratory distress.     Breath sounds: Normal breath sounds. No wheezing.  Musculoskeletal:     Cervical back: Neck supple.     Right lower leg: 1+ Edema present.     Left lower leg: 1+ Edema present.  Skin:    General: Skin is warm and dry.  Neurological:     Mental Status: She is alert and oriented to person, place, and time.  Psychiatric:        Behavior: Behavior normal.        Thought Content: Thought content normal.        Judgment: Judgment normal.           Assessment & Plan:  HTN- bp looks a bit better today even though she did not stop the HCTZ. I will check a bmet today- we will likely need to restar kdur- (will see how her bmet looks). In addition, I told her that it was ok to continue her hctz for now.    This visit occurred during the SARS-CoV-2 public health emergency.  Safety protocols were in place, including screening questions prior to the visit, additional usage of staff PPE, and extensive cleaning of exam room while observing appropriate contact time as indicated for disinfecting solutions.

## 2020-06-12 ENCOUNTER — Ambulatory Visit: Payer: Medicare HMO | Admitting: Pharmacist

## 2020-06-12 DIAGNOSIS — I10 Essential (primary) hypertension: Secondary | ICD-10-CM

## 2020-06-12 DIAGNOSIS — F32A Depression, unspecified: Secondary | ICD-10-CM

## 2020-06-12 NOTE — Patient Instructions (Addendum)
Visit Information  Goals Addressed            This Visit's Progress   . Chronic Care Management Pharmacy Care Plan       CARE PLAN ENTRY (see longitudinal plan of care for additional care plan information)  Current Barriers:  . Chronic Disease Management support, education, and care coordination needs related to Pre-DM, HTN, Depression, Osteoarthritis, Osteopenia, OSA, Insomnia   Hypertension BP Readings from Last 3 Encounters:  06/11/20 111/60  05/30/20 (!) 105/58  02/27/20 (!) 151/66   . Pharmacist Clinical Goal(s): o Over the next 90 days, patient will work with PharmD and providers to maintain BP goal <140/90 . Current regimen:   Losartan 50mg  daily  Hctz 12.5mg  daily . Interventions: o Requested patient to purchase blood pressure cuff and check BP 3-4 times per week . Patient self care activities - Over the next 90 days, patient will: o Check BP 3-4 times per week, document, and provide at future appointments o Ensure daily salt intake < 2300 mg/Lovena Kluck  Hyperlipidemia Lab Results  Component Value Date/Time   LDLCALC 109 (H) 08/25/2016 08:05 AM   . Pharmacist Clinical Goal(s): o Over the next 90 days, patient will work with PharmD and providers to achieve LDL goal < 100 . Current regimen:  o Fish oil 1200mg  daily . Interventions: o Consider completing lipid panel at next visit . Patient self care activities - Over the next 180 days, patient will: o Complete lipid panel for further assessment of hyperlipidemia risk   Pre-Diabetes Lab Results  Component Value Date/Time   HGBA1C 5.3 02/16/2017 09:14 AM   HGBA1C 5.7 04/23/2016 08:56 AM   . Pharmacist Clinical Goal(s): o Over the next 90 days, patient will work with PharmD and providers to maintain A1c goal <6.5% . Current regimen:  o Diet and exercise management   . Interventions: o Consider completing a1c at next visit . Patient self care activities - Over the next 180 days, patient will: o Complete a1c for  further assessment of pre-diabetes/diabetes risk  Depression . Pharmacist Clinical Goal(s) o Over the next 90 days, patient will work with PharmD and providers to reduce symptoms of depression  . Current regimen:   Fluoxetine 20mg  #2 daily  Bupropion 300mg  XL daily . Interventions: o Collaboration with provider regarding medication management (escitalopram switch to fluoxetine) o Collaboration with provider regarding medication management (fluoxetine increase to 40mg  daily) . Patient self care activities - Over the next 90 days, patient will: o Maintain depression medication regimen.   Medication management . Pharmacist Clinical Goal(s): o Over the next 90 days, patient will work with PharmD and providers to maintain optimal medication adherence . Current pharmacy: [facility] . Interventions o Comprehensive medication review performed. o Utilize [facility] pharmacy for medication synchronization, packaging and delivery . Patient self care activities - Over the next 90 days, patient will: o Focus on medication adherence by filling and taking medications appropriately o Take medications as prescribed o Report any questions or concerns to PharmD and/or provider(s)  Please see past updates related to this goal by clicking on the "Past Updates" button in the selected goal         Patient verbalizes understanding of instructions provided today.   Telephone follow up appointment with pharmacy team member scheduled for: 09/18/2019  De Blanch, PharmD Clinical Pharmacist [place] [facility] [phone]

## 2020-06-12 NOTE — Chronic Care Management (AMB) (Signed)
Chronic Care Management Pharmacy  Name: Jasmine Clayton  MRN: [id number] DOB: 1950-11-15   Chief Complaint/ HPI  Jasmine Clayton,  68 y.o. , female presents for their Follow-Up CCM visit with the clinical pharmacist via telephone due to COVID-19 Pandemic.  PCP : Jasmine Alar, NP  Their chronic conditions include: Pre-DM, HTN, Depression, Osteoarthritis, Osteopenia, OSA, Insomnia  Office Visits: 06/12/20: Visit w/ Jasmine Alar, NP - Last visit HTN appeared overtreated so D/C hctz and potassium, but patient did not stop hctz.   05/30/20: Visit w/ Jasmine Alar, NP - BP overtreated. D/C hctz an kdur. RTC 2 week.   Consult Visit: None since last CCM visit on 02/01/20.   Medications: Outpatient Encounter Medications as of 06/12/2020  Medication Sig  . aspirin 81 MG tablet Take 81 mg by mouth daily.    . B Complex Vitamins (B-COMPLEX/B-12 PO) Take 1 tablet by mouth daily.   . baclofen (LIORESAL) 10 MG tablet Take 0.5-1 tablets (5-10 mg total) by mouth 3 (three) times daily as needed for muscle spasms.  Marland Kitchen buPROPion (WELLBUTRIN XL) 300 MG 24 hr tablet Take 1 tab by mouth once daily  . Calcium Carb-Cholecalciferol (CALCIUM-VITAMIN D3) 600-500 MG-UNIT CAPS Take 1 tablet by mouth 2 (two) times daily.  Marland Kitchen FLUoxetine (PROZAC) 40 MG capsule Take 1 capsule (40 mg total) by mouth daily.  Marland Kitchen gabapentin (NEURONTIN) 100 MG capsule 1 PO q HS, may increase to 1 PO TID if needed  . hydrochlorothiazide (HYDRODIURIL) 25 MG tablet Take 1 tablet (25 mg total) by mouth daily.  Marland Kitchen losartan (COZAAR) 50 MG tablet Take 1 tablet (50 mg total) by mouth daily.  . Omega-3 Fatty Acids (FISH OIL) 1200 MG CAPS Take 1 capsule by mouth daily.   Facility-Administered Encounter Medications as of 06/12/2020  Medication  . sodium chloride 0.9 % injection 10 mL   SDOH Screenings   Alcohol Screen:   . Last Alcohol Screening Score (AUDIT): Not on file  Depression (PHQ2-9): Low Risk   . PHQ-2  Score: 2  Financial Resource Strain: Low Risk   . Difficulty of Paying Living Expenses: Not very hard  Food Insecurity:   . Worried About Charity fundraiser in the Last Year: Not on file  . Ran Out of Food in the Last Year: Not on file  Housing:   . Last Housing Risk Score: Not on file  Physical Activity:   . Days of Exercise per Week: Not on file  . Minutes of Exercise per Session: Not on file  Social Connections:   . Frequency of Communication with Friends and Family: Not on file  . Frequency of Social Gatherings with Friends and Family: Not on file  . Attends Religious Services: Not on file  . Active Member of Clubs or Organizations: Not on file  . Attends Archivist Meetings: Not on file  . Marital Status: Not on file  Stress:   . Feeling of Stress : Not on file  Tobacco Use: Low Risk   . Smoking Tobacco Use: Never Smoker  . Smokeless Tobacco Use: Never Used  Transportation Needs:   . Film/video editor (Medical): Not on file  . Lack of Transportation (Non-Medical): Not on file     Current Diagnosis/Assessment:  Goals Addressed            This Visit's Progress   . Chronic Care Management Pharmacy Care Plan       CARE PLAN ENTRY (see longitudinal plan of care  for additional care plan information)  Current Barriers:  . Chronic Disease Management support, education, and care coordination needs related to Pre-DM, HTN, Depression, Osteoarthritis, Osteopenia, OSA, Insomnia   Hypertension BP Readings from Last 3 Encounters:  06/11/20 111/60  05/30/20 (!) 105/58  02/27/20 (!) 151/66   . Pharmacist Clinical Goal(s): o Over the next 90 days, patient will work with PharmD and providers to maintain BP goal <140/90 . Current regimen:   Losartan 50mg  daily  Hctz 12.5mg  daily . Interventions: o Requested patient to purchase blood pressure cuff and check BP 3-4 times per week . Patient self care activities - Over the next 90 days, patient will: o Check BP  3-4 times per week, document, and provide at future appointments o Ensure daily salt intake < 2300 mg/Jaelin Devincentis  Hyperlipidemia Lab Results  Component Value Date/Time   LDLCALC 109 (H) 08/25/2016 08:05 AM   . Pharmacist Clinical Goal(s): o Over the next 90 days, patient will work with PharmD and providers to achieve LDL goal < 100 . Current regimen:  o Fish oil 1200mg  daily . Interventions: o Consider completing lipid panel at next visit . Patient self care activities - Over the next 180 days, patient will: o Complete lipid panel for further assessment of hyperlipidemia risk   Pre-Diabetes Lab Results  Component Value Date/Time   HGBA1C 5.3 02/16/2017 09:14 AM   HGBA1C 5.7 04/23/2016 08:56 AM   . Pharmacist Clinical Goal(s): o Over the next 90 days, patient will work with PharmD and providers to maintain A1c goal <6.5% . Current regimen:  o Diet and exercise management   . Interventions: o Consider completing a1c at next visit . Patient self care activities - Over the next 180 days, patient will: o Complete a1c for further assessment of pre-diabetes/diabetes risk  Depression . Pharmacist Clinical Goal(s) o Over the next 90 days, patient will work with PharmD and providers to reduce symptoms of depression  . Current regimen:   Fluoxetine 20mg  #2 daily  Bupropion 300mg  XL daily . Interventions: o Collaboration with provider regarding medication management (escitalopram switch to fluoxetine) o Collaboration with provider regarding medication management (fluoxetine increase to 40mg  daily) . Patient self care activities - Over the next 90 days, patient will: o Maintain depression medication regimen.   Medication management . Pharmacist Clinical Goal(s): o Over the next 90 days, patient will work with PharmD and providers to maintain optimal medication adherence . Current pharmacy: [facility] . Interventions o Comprehensive medication review performed. o Utilize [facility]  pharmacy for medication synchronization, packaging and delivery . Patient self care activities - Over the next 90 days, patient will: o Focus on medication adherence by filling and taking medications appropriately o Take medications as prescribed o Report any questions or concerns to PharmD and/or provider(s)  Please see past updates related to this goal by clicking on the "Past Updates" button in the selected goal         Hypertension   Blood Pressure Goal <140/90  Office blood pressures are  BP Readings from Last 3 Encounters:  06/11/20 111/60  05/30/20 (!) 105/58  02/27/20 (!) 151/66   . Patient has failed these meds in the past: None noted  Patient is currently uncontrolled on the following medications:    Losartan 50mg  daily  Hctz 12.5mg  daily  Patient checks BP at home 1-2x per week   From 11/27/19 CCM Visit BP cuff is too small. Was going to take it back, but threw the whole cuff away on  accident.  She states she will start using wrist cuff from her sister. We discussed that the wrist cuff may not be accurate   Update 12/26/19 States she did purchase a new cuff, but is still working on getting it to work properly.   Update 02/01/20 137 79 143 78 120 72 129 73 144  78 Average 134.6 76  Update 05/05/20 Admits her BP is elevated most likely due to pain. Admits to some headaches, but denies chest pains and dizziness. 149 95 150 78 142 78 119 78 Avg 140 82.25  Update 06/12/20 Doesn't have BP readings. She is not home. She wonders if her BP cuff is accurate. States she is still taking hctz and losartan. Reviewed labs drawn yesterday, but will defer to [facility] on next steps for treatment (continue vs D/C hctz and potassium) Admits she is not drinking as much water. Her goal is drink at least 4 bottles per Bergen Magner, but she is only drinking about 2 bottles per Tamaya Pun now. She denies taking NSAIDs. Discussed avoiding nephrotoxic drugs and to increase  hydration  Plan -Check blood pressure 3-4 times per week and record -Avoid NSAIDS  -Increase hydration to at least 4 bottles of water per Jammal Sarr -Continue current medications (anticipate call from PCP team to determine next steps pending lab results)  Depression   Depression screen [facility] 2/9 06/12/2020 05/30/2020 05/05/2020  Decreased Interest 0 1 1  Down, Depressed, Hopeless 0 0 2  PHQ - 2 Score 0 1 3  Altered sleeping 0 0 1  Tired, decreased energy 1 1 1   Change in appetite 1 1 1   Feeling bad or failure about yourself  0 0 1  Trouble concentrating 0 0 1  Moving slowly or fidgety/restless 0 1 0  Suicidal thoughts 0 0 0  PHQ-9 Score 2 4 8   Difficult doing work/chores - Somewhat difficult Somewhat difficult  Some recent data might be hidden    Patient has failed these meds in past: None noted  Patient is currently controlled on the following medications:   Fluoxetine 20mg  #2 daily (will eventually move to #1 40mg  daily when current supply complete)  Bupropion 300mg  XL daily  Update 02/01/20 PHQ9 = 3 (down from 7 on 11/13/19)  Update 05/05/20 PHQ9 = 8 and unchanged from last office visit.  We discussed options and patient is interested in increasing dose of fluoxetine.  Will consult with Melissa regarding the increase  Update 06/12/20 PHQ 9 trending down. Last CCM visit 8, last PCP visit 4, today 2.   Plan -Continue current medications    [facility] Reviewed patient's [facility] medication and Epic medication profile assuring there are no discrepancies or gaps in therapy. Confirmed all fill dates appropriate and verified with patient that there is a sufficient quantity of all prescribed medications at home. Informed patient to call me any time if needing medications before scheduled deliveries. The next anticipated medication sync date is 08/06/19.   De Blanch, PharmD Clinical Pharmacist [facility] [phone]

## 2020-06-13 ENCOUNTER — Telehealth: Payer: Self-pay | Admitting: Family

## 2020-06-13 MED ORDER — FLUOXETINE HCL 20 MG PO CAPS: 60.0000 mg | ORAL_CAPSULE | Freq: Every day | ORAL | 3 refills | Status: AC

## 2020-06-13 NOTE — Telephone Encounter (Signed)
Please advise pt that I reviewed her lab work.  Her kidney function has worsened slightly. I would like for her to stop hctz and remain off of Kdur. Please return in 2 weeks for follow up visit.

## 2020-06-13 NOTE — Telephone Encounter (Signed)
Also, I would like for her to increase her fluoxetine from 40mg  to 60mg  to help with her depression.

## 2020-06-13 NOTE — Telephone Encounter (Signed)
Lvm  for patient to call back about results. 

## 2020-06-16 NOTE — Telephone Encounter (Signed)
Lvm again for patient to call back as soon as possible

## 2020-06-16 NOTE — Telephone Encounter (Signed)
Advised patient of results, provider's advise, which medications to discontinue and which medication to increase. She verbalized understanding and was scheduled to come back 06-30-20

## 2020-06-30 ENCOUNTER — Ambulatory Visit: Payer: Medicare HMO | Admitting: Family

## 2020-06-30 ENCOUNTER — Telehealth: Payer: Self-pay | Admitting: Pharmacist

## 2020-06-30 NOTE — Progress Notes (Unsigned)
Chronic Care Management Pharmacy Assistant   Name: Jasmine Clayton  MRN: [id number] DOB: Jan 03, 1951  Reason for Encounter: Medication Review  Patient Questions:  1.  Have you seen any other providers since your last visit?   2.  Any changes in your medicines or health?   PCP : Debbrah Alar, NP   Their chronic conditions include: Pre-DM, HTN, Depression, Osteoarthritis, Osteopenia, OSA, Insomnia  Allergies:  No Known Allergies  Medications: Outpatient Encounter Medications as of 06/30/2020  Medication Sig  . aspirin 81 MG tablet Take 81 mg by mouth daily.    . B Complex Vitamins (B-COMPLEX/B-12 PO) Take 1 tablet by mouth daily.   . baclofen (LIORESAL) 10 MG tablet Take 0.5-1 tablets (5-10 mg total) by mouth 3 (three) times daily as needed for muscle spasms.  Marland Kitchen buPROPion (WELLBUTRIN XL) 300 MG 24 hr tablet Take 1 tab by mouth once daily  . Calcium Carb-Cholecalciferol (CALCIUM-VITAMIN D3) 600-500 MG-UNIT CAPS Take 1 tablet by mouth 2 (two) times daily.  Marland Kitchen FLUoxetine (PROZAC) 20 MG capsule Take 3 capsules (60 mg total) by mouth daily.  Marland Kitchen gabapentin (NEURONTIN) 100 MG capsule 1 PO q HS, may increase to 1 PO TID if needed  . hydrochlorothiazide (HYDRODIURIL) 25 MG tablet Take 1 tablet (25 mg total) by mouth daily.  Marland Kitchen losartan (COZAAR) 50 MG tablet Take 1 tablet (50 mg total) by mouth daily.  . Omega-3 Fatty Acids (FISH OIL) 1200 MG CAPS Take 1 capsule by mouth daily.   Facility-Administered Encounter Medications as of 06/30/2020  Medication  . sodium chloride 0.9 % injection 10 mL    Current Diagnosis: Patient Active Problem List   Diagnosis Date Noted  . OSA (obstructive sleep apnea) 07/04/2018  . 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 Osteopenia   . Depression 08/25/2016  . Combined forms of age-related cataract of both eyes 06/21/2016  . Hyperopia of both eyes with astigmatism and presbyopia 06/21/2016  . Meibomian gland  dysfunction 06/21/2016  . Vitreous syneresis of both eyes 06/21/2016  . Insomnia 11/28/2015  . Family history of uterine cancer   . Vaginal dryness, menopausal 12/12/2014  . Hyperglycemia 01/07/2014  . Osteoarthritis, knee 02/16/2013  . 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 Routine general medical examination at a health care facility 03/27/2012  . Adenocarcinoma of colon ([place]) 04/12/2011  . Morbid obesity ([place]) 02/10/2011  . HTN (hypertension) 02/10/2011    Goals Addressed   None    No OVs, Consults, or hospital visits since last care coordination call.  06-13-20(PCP) Debbrah Alar requested patient to increase fluoxetine 40 mg to 60 mg.  Provider discontinued Hydrochlorothiazide and Kdur.  BP Readings from Last 3 Encounters:  06/11/20 111/60  05/30/20 (!) 105/58  02/27/20 (!) 151/66    Lab Results  Component Value Date   HGBA1C 5.3 02/16/2017     Patient obtains medications through Adherence Packaging  90 Days   Last adherence delivery included:  Baclofen 10mg  -Vial  Patient declined (meds) last month due to 90-day supply on hand. Gabapentin 100mg  -Vial  Baclofen 10mg  -Vial  Hydrochlorothiazide 12.5 mg daily-Breakfast  Fluoxetine 20 mg daily-Breakfast  Bupropion Hcl 300 mg Xl daily-Breakfast  Losartan Potassium 50 mg daily-Breakfast Potassium Chl ER 10 mEQ- - one tab with Breakfast  Patient is due for next adherence delivery on: 07-01-20. Called patient and reviewed medications and coordinated delivery.  This delivery to include: Patient will need a short fill of (med), prior to adherence delivery.  Coordinated acute fill for (med) to be delivered (date).  Patient declined the following medications (meds) due to (reason)  Patient needs refills for ***.  Confirmed delivery date of 07-01-20, advised patient that pharmacy will contact them the morning of delivery.   Follow-Up:  Comptroller and Pharmacist Review   [place]  Shannon, [place] [facility] [place] Pharmacist Assistant [phone]

## 2020-07-03 ENCOUNTER — Telehealth: Payer: Self-pay | Admitting: Pharmacist

## 2020-07-03 NOTE — Progress Notes (Addendum)
Chronic Care Management Pharmacy Assistant   Name: Jasmine Clayton  MRN: [id number] DOB: 1951/05/20  Reason for Encounter: Disease State and Medication Review   Patient Questions:  1.  Have you seen any other providers since your last visit? No  2.  Any changes in your medicines or health? No    PCP : Debbrah Alar, NP   Their chronic conditions include: Pre-DM, HTN, Depression, Osteoarthritis, Osteopenia, OSA, Insomnia  Office Visits:  06/13/20: PCP message for patient to D/C hctz and potassium. Increase fluoxetine to 60mg  daily  Consults: None since their last CCM visit with the clinical pharmacist on 06-12-2020.  Allergies:  No Known Allergies  Medications: Outpatient Encounter Medications as of 07/03/2020  Medication Sig   aspirin 81 MG tablet Take 81 mg by mouth daily.     B Complex Vitamins (B-COMPLEX/B-12 PO) Take 1 tablet by mouth daily.    baclofen (LIORESAL) 10 MG tablet Take 0.5-1 tablets (5-10 mg total) by mouth 3 (three) times daily as needed for muscle spasms.   buPROPion (WELLBUTRIN XL) 300 MG 24 hr tablet Take 1 tab by mouth once daily   Calcium Carb-Cholecalciferol (CALCIUM-VITAMIN D3) 600-500 MG-UNIT CAPS Take 1 tablet by mouth 2 (two) times daily.   FLUoxetine (PROZAC) 20 MG capsule Take 3 capsules (60 mg total) by mouth daily.   gabapentin (NEURONTIN) 100 MG capsule 1 PO q HS, may increase to 1 PO TID if needed   hydrochlorothiazide (HYDRODIURIL) 25 MG tablet Take 1 tablet (25 mg total) by mouth daily.   losartan (COZAAR) 50 MG tablet Take 1 tablet (50 mg total) by mouth daily.   Omega-3 Fatty Acids (FISH OIL) 1200 MG CAPS Take 1 capsule by mouth daily.   Facility-Administered Encounter Medications as of 07/03/2020  Medication   sodium chloride 0.9 % injection 10 mL    Current Diagnosis: Patient Active Problem List   Diagnosis Date Noted   OSA (obstructive sleep apnea) 07/04/2018   Chronic bilateral low back pain with right-sided sciatica  04/05/2017   Osteopenia    Depression 08/25/2016   Combined forms of age-related cataract of both eyes 06/21/2016   Hyperopia of both eyes with astigmatism and presbyopia 06/21/2016   Meibomian gland dysfunction 06/21/2016   Vitreous syneresis of both eyes 06/21/2016   Insomnia 11/28/2015   Family history of uterine cancer    Vaginal dryness, menopausal 12/12/2014   Hyperglycemia 01/07/2014   Osteoarthritis, knee 02/16/2013   Cornea scar 08/28/2012   Routine general medical examination at a health care facility 03/27/2012   Adenocarcinoma of colon ([place]) 04/12/2011   Morbid obesity ([place]) 02/10/2011   HTN (hypertension) 02/10/2011    Goals Addressed   None    Reviewed chart prior to disease state call. Spoke with patient regarding BP  Recent Office Vitals: BP Readings from Last 3 Encounters:  06/11/20 111/60  05/30/20 (!) 105/58  02/27/20 (!) 151/66   Pulse Readings from Last 3 Encounters:  06/11/20 73  05/30/20 93  02/27/20 79    Wt Readings from Last 3 Encounters:  06/11/20 213 lb (96.6 kg)  05/30/20 210 lb (95.3 kg)  02/27/20 (!) 229 lb (103.9 kg)     Kidney Function Lab Results  Component Value Date/Time   CREATININE 1.51 (H) 06/11/2020 11:01 AM   CREATININE 1.16 03/06/2020 01:54 PM   CREATININE 1.1 04/03/2014 08:19 AM   CREATININE 1.04 01/07/2014 02:14 AM   CREATININE 1.19 (H) 10/01/2013 10:59 AM   CREATININE 1.1 03/12/2013 09:45 AM  GFR 35.36 (L) 06/11/2020 11:01 AM   GFRNONAA 68 (L) 07/16/2012 09:00 AM   GFRNONAA 58 (L) 03/15/2011 08:28 AM   GFRAA 79 (L) 07/16/2012 09:00 AM   GFRAA >60 03/15/2011 08:28 AM    BMP Latest Ref Rng & Units 06/11/2020 03/06/2020 02/27/2020  Glucose 70 - 99 mg/dL 79 87 97  BUN 6 - 23 mg/dL 17 13 15   Creatinine 0.40 - 1.20 mg/dL 1.51(H) 1.16 1.39(H)  Sodium 135 - 145 mEq/L 141 140 139  Potassium 3.5 - 5.1 mEq/L 3.5 4.0 3.4(L)  Chloride 96 - 112 mEq/L 104 105 103  CO2 19 - 32 mEq/L 29 27 29   Calcium 8.4 - 10.5 mg/dL 9.3 9.5  10.1    Current antihypertensive regimen:  Losartan 50mg  daily Hctz 12.5mg  daily  How often are you checking your Blood Pressure? Patient reports not checking her blood pressure  Current home BP readings: None at this time  What recent interventions/DTPs have been made by any provider to improve Blood Pressure control since last CPP Visit: None  Any recent hospitalizations or ED visits since last visit with CPP? No   What diet changes have been made to improve Blood Pressure Control?  Patient states she usually doesn't eat big meals. She is more of a snacker  What exercise is being done to improve your Blood Pressure Control?  Patient states she usually walks around stores for approximately one hour as physical activity  Adherence Review: Is the patient currently on ACE/ARB medication? Yes Losaratn 50 mg Does the patient have >5 day gap between last estimated fill dates? No     Lab Results  Component Value Date   HGBA1C 5.3 02/16/2017     Patient obtains medications through Adherence Packaging  90 Days   Last adherence delivery included:  Gabapentin 100mg  -Vial   Baclofen 10mg  -Vial   Hydrochlorothiazide 12. 5mg  with Breakfast   Fluoxetine 20 mg with Breakfast   Bupropion Hcl 300 mg Xl with Breakfast   Losartan Potassium 50 mg with Breakfast  Potassium Chl ER 10 mEQ- - one tab with Breakfast   Patient is due for next adherence delivery on: 07-07-2020 . Called patient and reviewed medications and coordinated delivery.  This delivery to include: Fluoxetine 20 mg; #3 capsules daily. Will be in a vial for for this delivery.   Patient declined the following medications due to receiving a 90 DS on 05-02-2020. Gabapentin 100mg  -Vial   Baclofen 10mg  -Vial   Hydrochlorothiazide 12. 5mg  with Breakfast   (D/C on 06/13/20)  Bupropion Hcl 300 mg Xl with Breakfast   Losartan Potassium 50 mg with Breakfast  Potassium Chl ER 10 mEQ- - one tab with Breakfast (D/C on 06/13/20)    Patient does not need any refills at this time.  Confirmed delivery date of 07-07-2020, advised patient that pharmacy will contact them the morning of delivery.  Follow-Up:  Coordination of Enhanced Pharmacy Services and Pharmacist Review   Fanny Skates, [place] Pharmacist Assistant [phone]  Called and left VM with patient to confirm she has D/C hctz and potassium and increase in fluoxetine to 60mg  daily.  Will have pharmacy team follow up.  27 minutes spent in review, coordination, call attempt, and documentation Reviewed by: De Blanch, PharmD, BCACP Clinical Pharmacist [place] [facility] [phone]

## 2020-07-11 ENCOUNTER — Ambulatory Visit: Payer: Self-pay | Admitting: *Deleted

## 2020-07-18 ENCOUNTER — Encounter: Payer: Self-pay | Admitting: Family

## 2020-07-18 ENCOUNTER — Ambulatory Visit (INDEPENDENT_AMBULATORY_CARE_PROVIDER_SITE_OTHER): Payer: Medicare HMO | Admitting: Family

## 2020-07-18 ENCOUNTER — Other Ambulatory Visit: Payer: Self-pay

## 2020-07-18 VITALS — BP 145/80 | HR 77 | Temp 98.5°F | Resp 16 | Ht 60.0 in | Wt 210.0 lb

## 2020-07-18 DIAGNOSIS — I10 Essential (primary) hypertension: Secondary | ICD-10-CM

## 2020-07-18 DIAGNOSIS — F32A Depression, unspecified: Secondary | ICD-10-CM

## 2020-07-18 DIAGNOSIS — G250 Essential tremor: Secondary | ICD-10-CM

## 2020-07-18 LAB — BASIC METABOLIC PANEL
BUN: 13 mg/dL (ref 6–23)
CO2: 27 mEq/L (ref 19–32)
Calcium: 9.5 mg/dL (ref 8.4–10.5)
Chloride: 105 mEq/L (ref 96–112)
Creatinine, Ser: 1.19 mg/dL (ref 0.40–1.20)
GFR: 47.02 mL/min — ABNORMAL LOW (ref >60.00)
Glucose, Bld: 80 mg/dL (ref 70–99)
Potassium: 3.8 mEq/L (ref 3.5–5.1)
Sodium: 140 mEq/L (ref 135–145)

## 2020-07-18 NOTE — Patient Instructions (Addendum)
Please complete lab work prior to leaving. Follow up as scheduled.

## 2020-07-18 NOTE — Progress Notes (Signed)
Subjective:    Patient ID: Jasmine Clayton, female    DOB: 1950/12/13, 69 y.o.   MRN: [id number]  HPI   Patient is a 69 yr old female who presents today for follow up.   HTN- She stopped the potassium and the Kdur approximately 2 weeks ago.   BP Readings from Last 3 Encounters:  07/18/20 (!) 147/77  06/11/20 111/60  05/30/20 (!) 105/58   Depression-  Notes improvement with increased dose of prozac.     C/o resting tremor- x 1 year, worsened recently.     Review of Systems See HPI  Past Medical History:  Diagnosis Date   Adenocarcinoma of colon ([place]) 04/12/2011   "rectal-colon"   Corneal ulcers and infections 07/04/12   Degenerative joint disease    right knee   DVT (deep vein thrombosis) in pregnancy 1979   during pregnancy. lower extremity   Family history of colon cancer    Family history of uterine cancer    History of chemotherapy     cycle 5 Folfox completed 09/23/11   History of radiation therapy 10/25/11 -12/01/11   pelvis/presacral region   Morbid obesity ([place])    OSA (obstructive sleep apnea) 07/04/2018   Severe per split night study 11/19  Trial of CPAP therapy on 12 cm H2O with a Wide size Resmed Nasal Mask AirFit N10 mask and heated humidification.   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   Does not work.    Social Determinants of Health   Financial Resource Strain: Low Risk    Difficulty of Paying Living Expenses: Not very hard  Food Insecurity: Not on file  Transportation Needs: Not on file  Physical Activity: Not on file  Stress: Not on file  Social Connections: Not on file  Intimate Partner Violence: Not on file    Past Surgical History:  Procedure Laterality Date   ABDOMINAL HYSTERECTOMY  3 years ago   [place], reoperation for necrotic ileostomy, transverse colostomy   COLOSTOMY  05/27/11   COLOSTOMY TAKEDOWN  07/11/2012   Procedure: COLOSTOMY TAKEDOWN;  Surgeon: Rolm Bookbinder, MD;  Location: [facility];  Service: General;  Laterality: N/A;  colostomy takedown   JOINT REPLACEMENT  2002   hip replacement  [place]   MASS EXCISION  2011   "mass removed from ovaries"   PORT-A-CATH REMOVAL  07/11/2012   Procedure: REMOVAL PORT-A-CATH;  Surgeon: Rolm Bookbinder, MD;  Location: [facility];  Service: General;  Laterality: N/A;  removal portacath   PORTACATH PLACEMENT  07/26/11   right portacath   TOTAL ABDOMINAL HYSTERECTOMY W/ BILATERAL SALPINGOOPHORECTOMY  11/30/09   BSO   TOTAL HIP ARTHROPLASTY  2002   left hip    Family History  Problem Relation Age of Onset   Uterine cancer Mother 7       deceased 31   Diabetes Father    Colon cancer Sister 60       currently 44   Alcoholism Brother     No Known Allergies  Current Outpatient Medications on File Prior to Visit  Medication Sig Dispense Refill   aspirin 81 MG tablet Take 81 mg by mouth daily.       B Complex  Vitamins (B-COMPLEX/B-12 PO) Take 1 tablet by mouth daily.      baclofen (LIORESAL) 10 MG tablet Take 0.5-1 tablets (5-10 mg total) by mouth 3 (three) times daily as needed for muscle spasms. 30 each 3   buPROPion (WELLBUTRIN XL) 300 MG 24 hr tablet Take 1 tab by mouth once daily 90 tablet 1   Calcium Carb-Cholecalciferol (CALCIUM-VITAMIN D3) 600-500 MG-UNIT CAPS Take 1 tablet by mouth 2 (two) times daily. 60 capsule 0   FLUoxetine (PROZAC) 20 MG capsule Take 3 capsules (60 mg total) by mouth daily. 180 capsule 3   gabapentin (NEURONTIN) 100 MG capsule 1 PO q HS, may increase to 1 PO TID if needed 90 capsule 3   hydrochlorothiazide (HYDRODIURIL) 25 MG tablet Take 1 tablet (25 mg total) by mouth daily. 90 tablet 3   losartan (COZAAR) 50 MG tablet Take 1 tablet (50 mg total) by mouth daily. 90 tablet 1   Omega-3 Fatty Acids (FISH OIL) 1200 MG CAPS Take 1 capsule by mouth daily.     Current Facility-Administered Medications on File Prior to Visit  Medication Dose Route Frequency Provider Last Rate Last Admin   sodium chloride 0.9 % injection 10 mL  10 mL Intravenous PRN [name], MD   10 mL at 05/25/12 1153    BP (!) 147/77 (BP Location: Right Arm, Patient Position: Sitting, Cuff Size: Large)    Pulse 77    Temp 98.5 F (36.9 C) (Oral)    Resp 16    Ht 5' (1.524 m)    Wt 210 lb (95.3 kg)    LMP 11/30/2009    SpO2 97%    BMI 41.01 kg/m       Objective:   Physical Exam Constitutional:      Appearance: She is well-developed and well-nourished.  Neck:     Thyroid: No thyromegaly.  Cardiovascular:     Rate and Rhythm: Normal rate and regular rhythm.     Heart sounds: Normal heart sounds. No murmur heard.   Pulmonary:     Effort: Pulmonary effort is normal. No respiratory distress.     Breath sounds: Normal breath sounds. No wheezing.  Musculoskeletal:     Cervical back: Neck supple.  Skin:    General: Skin is warm and dry.  Neurological:     Mental Status: She is  alert and oriented to person, place, and time.     Comments: Fine bilateral resting hand tremor  Psychiatric:  Mood and Affect: Mood and affect normal.        Behavior: Behavior normal.        Thought Content: Thought content normal.        Judgment: Judgment normal.           Assessment & Plan:  HTN- bp is acceptable for her age- continue lisinopril 5mg . Check follow up bmet.  Depression- improved on prozac 60mg  once daily. Continue same.  Benign essential tremor- hx and exam most consistent with benign essential tremor.  We discussed referral to neurology, but she declines. She prefers to watch it for now.   This visit occurred during the SARS-CoV-2 public health emergency.  Safety protocols were in place, including screening questions prior to the visit, additional usage of staff PPE, and extensive cleaning of exam room while observing appropriate contact time as indicated for disinfecting solutions.

## 2020-07-19 ENCOUNTER — Other Ambulatory Visit: Payer: Self-pay | Admitting: Family

## 2020-07-28 ENCOUNTER — Telehealth: Payer: Self-pay | Admitting: Pharmacist

## 2020-07-28 NOTE — Progress Notes (Addendum)
Chronic Care Management Pharmacy Assistant   Name: Jasmine Clayton  MRN: [id number] DOB: January 30, 1951  Reason for Encounter: Medication Review   PCP : [name], NP  Allergies:  No Known Allergies  Medications: Outpatient Encounter Medications as of 07/28/2020  Medication Sig Note   aspirin 81 MG tablet Take 81 mg by mouth daily.      B Complex Vitamins (B-COMPLEX/B-12 PO) Take 1 tablet by mouth daily.     baclofen (LIORESAL) 10 MG tablet Take 0.5-1 tablets (5-10 mg total) by mouth 3 (three) times daily as needed for muscle spasms.    buPROPion (WELLBUTRIN XL) 300 MG 24 hr tablet Take 1 tab by mouth once daily    Calcium Carb-Cholecalciferol (CALCIUM-VITAMIN D3) 600-500 MG-UNIT CAPS Take 1 tablet by mouth 2 (two) times daily.    FLUoxetine (PROZAC) 20 MG capsule Take 3 capsules (60 mg total) by mouth daily.    gabapentin (NEURONTIN) 100 MG capsule 1 PO q HS, may increase to 1 PO TID if needed    hydrochlorothiazide (HYDRODIURIL) 25 MG tablet Take 1 tablet (25 mg total) by mouth daily. 07/04/2020: D/C per PCP on 06/13/20   losartan (COZAAR) 50 MG tablet Take 1 tablet (50 mg total) by mouth every morning.    Omega-3 Fatty Acids (FISH OIL) 1200 MG CAPS Take 1 capsule by mouth daily.    Facility-Administered Encounter Medications as of 07/28/2020  Medication   sodium chloride 0.9 % injection 10 mL    Current Diagnosis: Patient Active Problem List   Diagnosis Date Noted   OSA (obstructive sleep apnea) 07/04/2018   Chronic bilateral low back pain with right-sided sciatica 04/05/2017   Osteopenia    Depression 08/25/2016   Combined forms of age-related cataract of both eyes 06/21/2016   Hyperopia of both eyes with astigmatism and presbyopia 06/21/2016   Meibomian gland dysfunction 06/21/2016   Vitreous syneresis of both eyes 06/21/2016   Insomnia 11/28/2015   Family history of uterine cancer    Vaginal dryness, menopausal 12/12/2014   Hyperglycemia 01/07/2014    Osteoarthritis, knee 02/16/2013   Cornea scar 08/28/2012   Routine general medical examination at a health care facility 03/27/2012   Adenocarcinoma of colon ([facility]) 04/12/2011   Morbid obesity ([facility]) 02/10/2011   HTN (hypertension) 02/10/2011    Goals Addressed   None     Office Visits: 07-18-2020 (PCP) Patient presented in the office for f/u. She reported stopping Potassium and Kdur appx two weeks ago. There were no medication changes made. Lab work orderd.  No Consults, or hospital visits since last care coordination call.    No medication changes indicated.  BP Readings from Last 3 Encounters:  07/18/20 (!) 145/80  06/11/20 111/60  05/30/20 (!) 105/58    Lab Results  Component Value Date   HGBA1C 5.3 02/16/2017     Patient obtains medications through Adherence Packaging  90 Days   Last adherence delivery included:  Fluoxetine 20 mg; #3 capsules daily. Will be in a vial for for this delivery.  Patient declined the following medications last month due to receiving a 90 DS on 05-02-2020. Gabapentin 100 mg -Vial   Baclofen 10 mg -Vial   Hydrochlorothiazide 12. 5mg  with Breakfast   (D/C on 06/13/20)  Bupropion Hcl 300 mg Xl with Breakfast   Losartan Potassium 50 mg with Breakfast  Potassium Chl ER 10 mEQ- - one tab with Breakfast (D/C on 06/13/20)   Patient is due for next adherence delivery on: 07-31-2020. Called  patient and reviewed medications and coordinated delivery.  This delivery to include: Bupropion Hcl 300 mg Xl with Breakfast   Losartan Potassium 50 mg with Breakfast Gabapentin 100 mg -Vial   Baclofen 10 mg -Vial   Fluoxetine 20 mg cap- three with Breakfast  Patient does not needs refills at this time .  Confirmed delivery date of 07-31-2020, advised patient that pharmacy will contact them the morning of delivery.  Follow-Up:  Pharmacist Review   Corwin Levins, [facility] Clinical Pharmacist Assistant [phone]  5 minutes spent in review, coordination,  and documentation.   Reviewed by: Katrinka Blazing, PharmD, BCACP Clinical Pharmacist [facility] [phone]

## 2020-08-13 ENCOUNTER — Encounter (HOSPITAL_BASED_OUTPATIENT_CLINIC_OR_DEPARTMENT_OTHER): Payer: Self-pay

## 2020-08-13 ENCOUNTER — Other Ambulatory Visit: Payer: Self-pay

## 2020-08-13 ENCOUNTER — Emergency Department (HOSPITAL_BASED_OUTPATIENT_CLINIC_OR_DEPARTMENT_OTHER): Payer: Medicare HMO

## 2020-08-13 DIAGNOSIS — Y92410 Unspecified street and highway as the place of occurrence of the external cause: Secondary | ICD-10-CM | POA: Insufficient documentation

## 2020-08-13 DIAGNOSIS — Z7982 Long term (current) use of aspirin: Secondary | ICD-10-CM | POA: Diagnosis not present

## 2020-08-13 DIAGNOSIS — S8991XA Unspecified injury of right lower leg, initial encounter: Secondary | ICD-10-CM | POA: Insufficient documentation

## 2020-08-13 DIAGNOSIS — Z85038 Personal history of other malignant neoplasm of large intestine: Secondary | ICD-10-CM | POA: Diagnosis not present

## 2020-08-13 DIAGNOSIS — Z96642 Presence of left artificial hip joint: Secondary | ICD-10-CM | POA: Diagnosis not present

## 2020-08-13 DIAGNOSIS — Z041 Encounter for examination and observation following transport accident: Secondary | ICD-10-CM | POA: Diagnosis not present

## 2020-08-13 DIAGNOSIS — Z9221 Personal history of antineoplastic chemotherapy: Secondary | ICD-10-CM | POA: Diagnosis not present
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
## 2020-08-14 ENCOUNTER — Emergency Department (HOSPITAL_BASED_OUTPATIENT_CLINIC_OR_DEPARTMENT_OTHER)
Admission: EM | Admit: 2020-08-14 | Discharge: 2020-08-14 | Disposition: A | Discharge status: Home / Self Care | Payer: Medicare HMO | Attending: Emergency Medicine | Admitting: Emergency Medicine

## 2020-08-14 DIAGNOSIS — S8991XA Unspecified injury of right lower leg, initial encounter: Secondary | ICD-10-CM | POA: Diagnosis not present
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
## 2020-08-14 NOTE — ED Notes (Signed)
Patient verbalizes understanding of discharge instructions. Opportunity for questioning and answers were provided. Armband removed by staff, pt discharged from ED ambulatory to home.  

## 2020-08-22 ENCOUNTER — Telehealth: Payer: Self-pay | Admitting: Pharmacist
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
## 2020-09-09 ENCOUNTER — Telehealth: Payer: Self-pay | Admitting: Family

## 2020-09-09 NOTE — Telephone Encounter (Signed)
Checked [facility]. There is no certificate started by funeral home. Left message for funeral director Jasmine Clayton to give me a call back.

## 2020-09-09 NOTE — Telephone Encounter (Signed)
Caller: Denise([employer]) Call back # [phone]  Patient passed on Sep 29, 2020 they would like to know if provider would sign death certificate.

## 2020-09-10 ENCOUNTER — Ambulatory Visit: Payer: Medicare HMO | Admitting: Family

## 2020-09-11 NOTE — Telephone Encounter (Signed)
I spoke with daughter who found pt deceased and cool to touch on the kitchen floor at her home. Not breathing. She called EMS who came and performed CPR for 45 minutes without success.  She states that pt passed sometime between 8am and 11am in the morning as she was upstairs napping during that time and found her when she got up.  I spoke to [place] at the funeral home and she initiated the death certificate.  I signed death certificate.    Kim, could you please change pt status to "deceased." thanks.

## 2020-09-17 ENCOUNTER — Telehealth: Payer: Medicare HMO

## 2020-09-30 DEATH — deceased
